# Patient Record
Sex: Female | Born: 1955 | Race: White | Hispanic: No | Smoking: Former smoker
Health system: Southern US, Community
[De-identification: ages and names within clinical notes are randomized; demographics above are authoritative.]

## PROBLEM LIST (undated history)

## (undated) DIAGNOSIS — R011 Cardiac murmur, unspecified: Secondary | ICD-10-CM

## (undated) DIAGNOSIS — F5000 Anorexia nervosa, unspecified: Secondary | ICD-10-CM

## (undated) DIAGNOSIS — F419 Anxiety disorder, unspecified: Secondary | ICD-10-CM

## (undated) DIAGNOSIS — G894 Chronic pain syndrome: Secondary | ICD-10-CM

## (undated) DIAGNOSIS — F4310 Post-traumatic stress disorder, unspecified: Secondary | ICD-10-CM

## (undated) DIAGNOSIS — R071 Chest pain on breathing: Secondary | ICD-10-CM

## (undated) DIAGNOSIS — Z889 Allergy status to unspecified drugs, medicaments and biological substances status: Secondary | ICD-10-CM

## (undated) DIAGNOSIS — A048 Other specified bacterial intestinal infections: Secondary | ICD-10-CM

## (undated) DIAGNOSIS — K439 Ventral hernia without obstruction or gangrene: Secondary | ICD-10-CM

## (undated) DIAGNOSIS — H35341 Macular cyst, hole, or pseudohole, right eye: Secondary | ICD-10-CM

## (undated) DIAGNOSIS — F329 Major depressive disorder, single episode, unspecified: Secondary | ICD-10-CM

## (undated) DIAGNOSIS — G629 Polyneuropathy, unspecified: Secondary | ICD-10-CM

## (undated) DIAGNOSIS — I7300 Raynaud's syndrome without gangrene: Secondary | ICD-10-CM

## (undated) DIAGNOSIS — M1990 Unspecified osteoarthritis, unspecified site: Secondary | ICD-10-CM

## (undated) DIAGNOSIS — F411 Generalized anxiety disorder: Secondary | ICD-10-CM

## (undated) DIAGNOSIS — E079 Disorder of thyroid, unspecified: Secondary | ICD-10-CM

## (undated) DIAGNOSIS — I10 Essential (primary) hypertension: Secondary | ICD-10-CM

## (undated) DIAGNOSIS — G3184 Mild cognitive impairment, so stated: Secondary | ICD-10-CM

## (undated) DIAGNOSIS — R251 Tremor, unspecified: Secondary | ICD-10-CM

## (undated) DIAGNOSIS — D649 Anemia, unspecified: Secondary | ICD-10-CM

## (undated) DIAGNOSIS — Z79899 Other long term (current) drug therapy: Secondary | ICD-10-CM

## (undated) DIAGNOSIS — R739 Hyperglycemia, unspecified: Secondary | ICD-10-CM

## (undated) DIAGNOSIS — Z87828 Personal history of other (healed) physical injury and trauma: Secondary | ICD-10-CM

## (undated) DIAGNOSIS — F070 Personality change due to known physiological condition: Secondary | ICD-10-CM

## (undated) DIAGNOSIS — K3184 Gastroparesis: Secondary | ICD-10-CM

## (undated) DIAGNOSIS — K219 Gastro-esophageal reflux disease without esophagitis: Secondary | ICD-10-CM

## (undated) DIAGNOSIS — Z9289 Personal history of other medical treatment: Secondary | ICD-10-CM

## (undated) HISTORY — DX: Post-traumatic stress disorder, unspecified: F43.10

## (undated) HISTORY — DX: Allergy status to unspecified drugs, medicaments and biological substances: Z88.9

## (undated) HISTORY — PX: CHOLECYSTECTOMY: SHX55

## (undated) HISTORY — PX: PEG TUBE PLACEMENT: SUR1034

## (undated) HISTORY — DX: Tremor, unspecified: R25.1

## (undated) HISTORY — DX: Mild cognitive impairment, so stated: G31.84

## (undated) HISTORY — DX: Gastro-esophageal reflux disease without esophagitis: K21.9

## (undated) HISTORY — DX: Raynaud's syndrome without gangrene: I73.00

## (undated) HISTORY — DX: Disorder of thyroid, unspecified: E07.9

## (undated) HISTORY — DX: Generalized anxiety disorder: F41.1

## (undated) HISTORY — DX: Personal history of other (healed) physical injury and trauma: Z87.828

## (undated) HISTORY — PX: BREAST SURGERY: SHX581

## (undated) HISTORY — DX: Hyperglycemia, unspecified: R73.9

## (undated) HISTORY — DX: Chest pain on breathing: R07.1

## (undated) HISTORY — DX: Macular cyst, hole, or pseudohole, right eye: H35.341

## (undated) HISTORY — DX: Chronic pain syndrome: G89.4

## (undated) HISTORY — DX: Ventral hernia without obstruction or gangrene: K43.9

## (undated) HISTORY — DX: Gastroparesis: K31.84

## (undated) HISTORY — PX: COLONOSCOPY: SHX174

## (undated) HISTORY — PX: HERNIA REPAIR: SHX51

## (undated) HISTORY — DX: Personality change due to known physiological condition: F07.0

## (undated) HISTORY — DX: Other long term (current) drug therapy: Z79.899

## (undated) HISTORY — PX: TONSILLECTOMY AND ADENOIDECTOMY: SUR1326

## (undated) HISTORY — DX: Other specified bacterial intestinal infections: A04.8

## (undated) HISTORY — DX: Anorexia nervosa, unspecified: F50.00

---

## 1997-05-18 ENCOUNTER — Other Ambulatory Visit: Admission: RE | Admit: 1997-05-18 | Discharge: 1997-05-18 | Payer: Self-pay | Admitting: *Deleted

## 1997-07-20 ENCOUNTER — Ambulatory Visit (HOSPITAL_COMMUNITY): Admission: RE | Admit: 1997-07-20 | Discharge: 1997-07-20 | Payer: Self-pay | Admitting: Internal Medicine

## 1997-07-28 ENCOUNTER — Ambulatory Visit (HOSPITAL_COMMUNITY): Admission: RE | Admit: 1997-07-28 | Discharge: 1997-07-28 | Payer: Self-pay | Admitting: Internal Medicine

## 1998-04-27 ENCOUNTER — Ambulatory Visit (HOSPITAL_COMMUNITY): Admission: RE | Admit: 1998-04-27 | Discharge: 1998-04-27 | Payer: Self-pay | Admitting: Family Medicine

## 1998-05-10 ENCOUNTER — Ambulatory Visit (HOSPITAL_COMMUNITY): Admission: RE | Admit: 1998-05-10 | Discharge: 1998-05-10 | Payer: Self-pay | Admitting: *Deleted

## 1998-05-10 ENCOUNTER — Encounter: Payer: Self-pay | Admitting: *Deleted

## 1998-06-07 ENCOUNTER — Encounter: Payer: Self-pay | Admitting: Internal Medicine

## 1998-06-07 ENCOUNTER — Encounter: Admission: RE | Admit: 1998-06-07 | Discharge: 1998-06-07 | Payer: Self-pay | Admitting: Internal Medicine

## 1998-06-07 ENCOUNTER — Ambulatory Visit (HOSPITAL_COMMUNITY): Admission: RE | Admit: 1998-06-07 | Discharge: 1998-06-07 | Payer: Self-pay | Admitting: Internal Medicine

## 1998-07-04 ENCOUNTER — Ambulatory Visit (HOSPITAL_COMMUNITY): Admission: RE | Admit: 1998-07-04 | Discharge: 1998-07-04 | Payer: Self-pay | Admitting: Cardiology

## 1998-07-17 ENCOUNTER — Ambulatory Visit (HOSPITAL_COMMUNITY): Admission: RE | Admit: 1998-07-17 | Discharge: 1998-07-17 | Payer: Self-pay | Admitting: Family Medicine

## 1998-07-17 ENCOUNTER — Encounter: Payer: Self-pay | Admitting: Family Medicine

## 1998-07-24 ENCOUNTER — Other Ambulatory Visit: Admission: RE | Admit: 1998-07-24 | Discharge: 1998-07-24 | Payer: Self-pay | Admitting: *Deleted

## 1998-07-27 ENCOUNTER — Ambulatory Visit: Admission: RE | Admit: 1998-07-27 | Discharge: 1998-07-27 | Payer: Self-pay | Admitting: Surgery

## 1998-08-16 ENCOUNTER — Ambulatory Visit (HOSPITAL_COMMUNITY): Admission: RE | Admit: 1998-08-16 | Discharge: 1998-08-16 | Payer: Self-pay | Admitting: Surgery

## 1998-09-29 ENCOUNTER — Inpatient Hospital Stay (HOSPITAL_COMMUNITY): Admission: RE | Admit: 1998-09-29 | Discharge: 1998-10-02 | Payer: Self-pay | Admitting: Surgery

## 1998-09-29 ENCOUNTER — Encounter (INDEPENDENT_AMBULATORY_CARE_PROVIDER_SITE_OTHER): Payer: Self-pay | Admitting: Specialist

## 1998-09-30 ENCOUNTER — Encounter: Payer: Self-pay | Admitting: Surgery

## 1998-11-02 ENCOUNTER — Encounter: Payer: Self-pay | Admitting: Surgery

## 1998-11-02 ENCOUNTER — Ambulatory Visit (HOSPITAL_COMMUNITY): Admission: RE | Admit: 1998-11-02 | Discharge: 1998-11-02 | Payer: Self-pay | Admitting: Surgery

## 1998-11-10 ENCOUNTER — Encounter: Payer: Self-pay | Admitting: *Deleted

## 1998-11-10 ENCOUNTER — Ambulatory Visit (HOSPITAL_COMMUNITY): Admission: RE | Admit: 1998-11-10 | Discharge: 1998-11-10 | Payer: Self-pay | Admitting: *Deleted

## 1998-11-15 ENCOUNTER — Emergency Department (HOSPITAL_COMMUNITY): Admission: EM | Admit: 1998-11-15 | Discharge: 1998-11-15 | Payer: Self-pay | Admitting: Emergency Medicine

## 1998-11-20 ENCOUNTER — Inpatient Hospital Stay (HOSPITAL_COMMUNITY): Admission: EM | Admit: 1998-11-20 | Discharge: 1998-11-21 | Payer: Self-pay | Admitting: Emergency Medicine

## 1999-01-09 ENCOUNTER — Encounter (INDEPENDENT_AMBULATORY_CARE_PROVIDER_SITE_OTHER): Payer: Self-pay

## 1999-01-09 ENCOUNTER — Ambulatory Visit (HOSPITAL_COMMUNITY): Admission: RE | Admit: 1999-01-09 | Discharge: 1999-01-09 | Payer: Self-pay | Admitting: Internal Medicine

## 1999-02-15 ENCOUNTER — Encounter: Admission: RE | Admit: 1999-02-15 | Discharge: 1999-05-16 | Payer: Self-pay | Admitting: Otolaryngology

## 1999-07-11 ENCOUNTER — Ambulatory Visit (HOSPITAL_COMMUNITY): Admission: RE | Admit: 1999-07-11 | Discharge: 1999-07-11 | Payer: Self-pay | Admitting: *Deleted

## 1999-07-11 ENCOUNTER — Encounter: Payer: Self-pay | Admitting: *Deleted

## 1999-09-07 ENCOUNTER — Other Ambulatory Visit: Admission: RE | Admit: 1999-09-07 | Discharge: 1999-09-07 | Payer: Self-pay | Admitting: *Deleted

## 2000-07-24 ENCOUNTER — Other Ambulatory Visit: Admission: RE | Admit: 2000-07-24 | Discharge: 2000-07-24 | Payer: Self-pay | Admitting: Internal Medicine

## 2000-07-24 ENCOUNTER — Encounter (INDEPENDENT_AMBULATORY_CARE_PROVIDER_SITE_OTHER): Payer: Self-pay | Admitting: Specialist

## 2000-10-01 ENCOUNTER — Encounter: Payer: Self-pay | Admitting: *Deleted

## 2000-10-01 ENCOUNTER — Ambulatory Visit (HOSPITAL_COMMUNITY): Admission: RE | Admit: 2000-10-01 | Discharge: 2000-10-01 | Payer: Self-pay | Admitting: *Deleted

## 2000-10-06 ENCOUNTER — Other Ambulatory Visit: Admission: RE | Admit: 2000-10-06 | Discharge: 2000-10-06 | Payer: Self-pay | Admitting: *Deleted

## 2001-01-27 ENCOUNTER — Ambulatory Visit (HOSPITAL_COMMUNITY): Admission: RE | Admit: 2001-01-27 | Discharge: 2001-01-27 | Payer: Self-pay | Admitting: Family Medicine

## 2001-01-27 ENCOUNTER — Encounter: Payer: Self-pay | Admitting: Family Medicine

## 2001-03-13 ENCOUNTER — Encounter: Payer: Self-pay | Admitting: Surgery

## 2001-03-18 ENCOUNTER — Ambulatory Visit (HOSPITAL_COMMUNITY): Admission: RE | Admit: 2001-03-18 | Discharge: 2001-03-18 | Payer: Self-pay | Admitting: Internal Medicine

## 2001-03-20 ENCOUNTER — Inpatient Hospital Stay (HOSPITAL_COMMUNITY): Admission: RE | Admit: 2001-03-20 | Discharge: 2001-03-26 | Payer: Self-pay | Admitting: Surgery

## 2001-03-21 ENCOUNTER — Encounter: Payer: Self-pay | Admitting: Surgery

## 2001-08-06 ENCOUNTER — Encounter: Admission: RE | Admit: 2001-08-06 | Discharge: 2001-08-06 | Payer: Self-pay | Admitting: Family Medicine

## 2001-08-19 ENCOUNTER — Encounter: Admission: RE | Admit: 2001-08-19 | Discharge: 2001-08-19 | Payer: Self-pay | Admitting: Sports Medicine

## 2001-08-31 ENCOUNTER — Encounter: Admission: RE | Admit: 2001-08-31 | Discharge: 2001-08-31 | Payer: Self-pay | Admitting: Family Medicine

## 2001-10-09 ENCOUNTER — Encounter: Admission: RE | Admit: 2001-10-09 | Discharge: 2001-10-09 | Payer: Self-pay | Admitting: Family Medicine

## 2001-10-14 ENCOUNTER — Other Ambulatory Visit: Admission: RE | Admit: 2001-10-14 | Discharge: 2001-10-14 | Payer: Self-pay | Admitting: *Deleted

## 2001-10-15 ENCOUNTER — Ambulatory Visit (HOSPITAL_COMMUNITY): Admission: RE | Admit: 2001-10-15 | Discharge: 2001-10-15 | Payer: Self-pay | Admitting: *Deleted

## 2001-10-15 ENCOUNTER — Encounter: Payer: Self-pay | Admitting: *Deleted

## 2001-10-26 ENCOUNTER — Encounter: Admission: RE | Admit: 2001-10-26 | Discharge: 2001-10-26 | Payer: Self-pay | Admitting: Family Medicine

## 2001-11-16 ENCOUNTER — Encounter: Admission: RE | Admit: 2001-11-16 | Discharge: 2001-11-16 | Payer: Self-pay | Admitting: Family Medicine

## 2001-11-30 ENCOUNTER — Encounter: Admission: RE | Admit: 2001-11-30 | Discharge: 2001-11-30 | Payer: Self-pay | Admitting: Family Medicine

## 2001-12-14 ENCOUNTER — Encounter: Admission: RE | Admit: 2001-12-14 | Discharge: 2001-12-14 | Payer: Self-pay | Admitting: Family Medicine

## 2001-12-28 ENCOUNTER — Encounter: Admission: RE | Admit: 2001-12-28 | Discharge: 2001-12-28 | Payer: Self-pay | Admitting: Family Medicine

## 2002-01-18 ENCOUNTER — Encounter: Admission: RE | Admit: 2002-01-18 | Discharge: 2002-01-18 | Payer: Self-pay | Admitting: Family Medicine

## 2002-02-18 ENCOUNTER — Encounter: Admission: RE | Admit: 2002-02-18 | Discharge: 2002-02-18 | Payer: Self-pay | Admitting: Sports Medicine

## 2002-03-04 ENCOUNTER — Encounter: Admission: RE | Admit: 2002-03-04 | Discharge: 2002-03-04 | Payer: Self-pay | Admitting: Family Medicine

## 2002-03-19 ENCOUNTER — Encounter: Admission: RE | Admit: 2002-03-19 | Discharge: 2002-03-19 | Payer: Self-pay | Admitting: Family Medicine

## 2002-03-30 ENCOUNTER — Encounter: Admission: RE | Admit: 2002-03-30 | Discharge: 2002-03-30 | Payer: Self-pay | Admitting: Family Medicine

## 2002-04-19 ENCOUNTER — Encounter: Admission: RE | Admit: 2002-04-19 | Discharge: 2002-04-19 | Payer: Self-pay | Admitting: Family Medicine

## 2002-04-26 ENCOUNTER — Encounter: Admission: RE | Admit: 2002-04-26 | Discharge: 2002-04-26 | Payer: Self-pay | Admitting: Family Medicine

## 2002-05-03 ENCOUNTER — Encounter: Admission: RE | Admit: 2002-05-03 | Discharge: 2002-05-03 | Payer: Self-pay | Admitting: Family Medicine

## 2002-05-10 ENCOUNTER — Encounter: Admission: RE | Admit: 2002-05-10 | Discharge: 2002-05-10 | Payer: Self-pay | Admitting: Family Medicine

## 2002-05-25 ENCOUNTER — Encounter: Payer: Self-pay | Admitting: Internal Medicine

## 2002-05-25 ENCOUNTER — Ambulatory Visit (HOSPITAL_COMMUNITY): Admission: RE | Admit: 2002-05-25 | Discharge: 2002-05-25 | Payer: Self-pay | Admitting: Internal Medicine

## 2002-06-01 ENCOUNTER — Encounter: Admission: RE | Admit: 2002-06-01 | Discharge: 2002-06-01 | Payer: Self-pay | Admitting: Family Medicine

## 2002-06-14 ENCOUNTER — Encounter: Admission: RE | Admit: 2002-06-14 | Discharge: 2002-06-14 | Payer: Self-pay | Admitting: Family Medicine

## 2002-06-30 ENCOUNTER — Encounter: Admission: RE | Admit: 2002-06-30 | Discharge: 2002-06-30 | Payer: Self-pay | Admitting: Family Medicine

## 2002-07-13 ENCOUNTER — Encounter: Admission: RE | Admit: 2002-07-13 | Discharge: 2002-07-13 | Payer: Self-pay | Admitting: Family Medicine

## 2002-07-28 ENCOUNTER — Encounter: Admission: RE | Admit: 2002-07-28 | Discharge: 2002-07-28 | Payer: Self-pay | Admitting: Family Medicine

## 2002-08-11 ENCOUNTER — Encounter: Admission: RE | Admit: 2002-08-11 | Discharge: 2002-08-11 | Payer: Self-pay | Admitting: Family Medicine

## 2002-08-18 ENCOUNTER — Encounter: Admission: RE | Admit: 2002-08-18 | Discharge: 2002-08-18 | Payer: Self-pay | Admitting: Family Medicine

## 2002-08-23 ENCOUNTER — Encounter: Admission: RE | Admit: 2002-08-23 | Discharge: 2002-08-23 | Payer: Self-pay | Admitting: Family Medicine

## 2002-09-06 ENCOUNTER — Encounter: Admission: RE | Admit: 2002-09-06 | Discharge: 2002-09-06 | Payer: Self-pay | Admitting: Family Medicine

## 2002-09-15 ENCOUNTER — Encounter: Admission: RE | Admit: 2002-09-15 | Discharge: 2002-09-15 | Payer: Self-pay | Admitting: Family Medicine

## 2002-09-22 ENCOUNTER — Encounter: Admission: RE | Admit: 2002-09-22 | Discharge: 2002-09-22 | Payer: Self-pay | Admitting: Family Medicine

## 2002-09-27 ENCOUNTER — Encounter: Payer: Self-pay | Admitting: *Deleted

## 2002-09-27 ENCOUNTER — Encounter: Admission: RE | Admit: 2002-09-27 | Discharge: 2002-09-27 | Payer: Self-pay | Admitting: *Deleted

## 2002-09-28 ENCOUNTER — Encounter: Admission: RE | Admit: 2002-09-28 | Discharge: 2002-09-28 | Payer: Self-pay | Admitting: Family Medicine

## 2002-10-06 ENCOUNTER — Encounter: Admission: RE | Admit: 2002-10-06 | Discharge: 2002-10-06 | Payer: Self-pay | Admitting: Family Medicine

## 2002-10-12 ENCOUNTER — Encounter: Admission: RE | Admit: 2002-10-12 | Discharge: 2002-10-12 | Payer: Self-pay | Admitting: Family Medicine

## 2002-10-19 ENCOUNTER — Encounter: Admission: RE | Admit: 2002-10-19 | Discharge: 2002-10-19 | Payer: Self-pay | Admitting: Family Medicine

## 2002-10-20 ENCOUNTER — Other Ambulatory Visit: Admission: RE | Admit: 2002-10-20 | Discharge: 2002-10-20 | Payer: Self-pay | Admitting: *Deleted

## 2002-11-11 ENCOUNTER — Encounter: Admission: RE | Admit: 2002-11-11 | Discharge: 2002-11-11 | Payer: Self-pay | Admitting: Family Medicine

## 2002-11-16 ENCOUNTER — Encounter: Admission: RE | Admit: 2002-11-16 | Discharge: 2002-11-16 | Payer: Self-pay | Admitting: Family Medicine

## 2002-11-22 ENCOUNTER — Encounter: Admission: RE | Admit: 2002-11-22 | Discharge: 2002-11-22 | Payer: Self-pay | Admitting: Family Medicine

## 2002-12-06 ENCOUNTER — Encounter: Admission: RE | Admit: 2002-12-06 | Discharge: 2002-12-06 | Payer: Self-pay | Admitting: Family Medicine

## 2002-12-13 ENCOUNTER — Encounter: Admission: RE | Admit: 2002-12-13 | Discharge: 2002-12-13 | Payer: Self-pay | Admitting: Family Medicine

## 2002-12-24 ENCOUNTER — Encounter: Admission: RE | Admit: 2002-12-24 | Discharge: 2002-12-24 | Payer: Self-pay | Admitting: Family Medicine

## 2002-12-31 ENCOUNTER — Encounter: Admission: RE | Admit: 2002-12-31 | Discharge: 2002-12-31 | Payer: Self-pay | Admitting: Family Medicine

## 2003-01-10 ENCOUNTER — Encounter: Admission: RE | Admit: 2003-01-10 | Discharge: 2003-01-10 | Payer: Self-pay | Admitting: Family Medicine

## 2003-01-17 ENCOUNTER — Encounter: Admission: RE | Admit: 2003-01-17 | Discharge: 2003-01-17 | Payer: Self-pay | Admitting: Family Medicine

## 2003-01-24 ENCOUNTER — Encounter: Admission: RE | Admit: 2003-01-24 | Discharge: 2003-01-24 | Payer: Self-pay | Admitting: Family Medicine

## 2003-01-31 ENCOUNTER — Encounter: Admission: RE | Admit: 2003-01-31 | Discharge: 2003-01-31 | Payer: Self-pay | Admitting: Family Medicine

## 2003-02-12 HISTORY — PX: NISSEN FUNDOPLICATION: SHX2091

## 2003-02-21 ENCOUNTER — Encounter: Admission: RE | Admit: 2003-02-21 | Discharge: 2003-02-21 | Payer: Self-pay | Admitting: Family Medicine

## 2003-03-14 ENCOUNTER — Encounter: Admission: RE | Admit: 2003-03-14 | Discharge: 2003-03-14 | Payer: Self-pay | Admitting: Family Medicine

## 2003-03-21 ENCOUNTER — Encounter: Admission: RE | Admit: 2003-03-21 | Discharge: 2003-03-21 | Payer: Self-pay | Admitting: Family Medicine

## 2003-03-28 ENCOUNTER — Encounter: Admission: RE | Admit: 2003-03-28 | Discharge: 2003-03-28 | Payer: Self-pay | Admitting: Family Medicine

## 2003-04-04 ENCOUNTER — Encounter: Admission: RE | Admit: 2003-04-04 | Discharge: 2003-04-04 | Payer: Self-pay | Admitting: Family Medicine

## 2003-04-11 ENCOUNTER — Encounter: Admission: RE | Admit: 2003-04-11 | Discharge: 2003-04-11 | Payer: Self-pay | Admitting: Family Medicine

## 2003-04-18 ENCOUNTER — Encounter: Admission: RE | Admit: 2003-04-18 | Discharge: 2003-04-18 | Payer: Self-pay | Admitting: Family Medicine

## 2003-04-25 ENCOUNTER — Encounter: Admission: RE | Admit: 2003-04-25 | Discharge: 2003-04-25 | Payer: Self-pay | Admitting: Family Medicine

## 2003-05-02 ENCOUNTER — Encounter: Admission: RE | Admit: 2003-05-02 | Discharge: 2003-05-02 | Payer: Self-pay | Admitting: Family Medicine

## 2003-05-09 ENCOUNTER — Other Ambulatory Visit: Admission: RE | Admit: 2003-05-09 | Discharge: 2003-05-09 | Payer: Self-pay | Admitting: *Deleted

## 2003-05-09 ENCOUNTER — Encounter: Admission: RE | Admit: 2003-05-09 | Discharge: 2003-05-09 | Payer: Self-pay | Admitting: Family Medicine

## 2003-05-16 ENCOUNTER — Encounter: Admission: RE | Admit: 2003-05-16 | Discharge: 2003-05-16 | Payer: Self-pay | Admitting: Family Medicine

## 2003-05-30 ENCOUNTER — Encounter: Admission: RE | Admit: 2003-05-30 | Discharge: 2003-05-30 | Payer: Self-pay | Admitting: Family Medicine

## 2003-06-06 ENCOUNTER — Encounter: Admission: RE | Admit: 2003-06-06 | Discharge: 2003-06-06 | Payer: Self-pay | Admitting: Family Medicine

## 2003-06-13 ENCOUNTER — Encounter: Admission: RE | Admit: 2003-06-13 | Discharge: 2003-06-13 | Payer: Self-pay | Admitting: Family Medicine

## 2003-06-20 ENCOUNTER — Encounter: Admission: RE | Admit: 2003-06-20 | Discharge: 2003-06-20 | Payer: Self-pay | Admitting: Family Medicine

## 2003-06-27 ENCOUNTER — Encounter: Admission: RE | Admit: 2003-06-27 | Discharge: 2003-06-27 | Payer: Self-pay | Admitting: Family Medicine

## 2003-07-01 ENCOUNTER — Encounter: Admission: RE | Admit: 2003-07-01 | Discharge: 2003-07-01 | Payer: Self-pay | Admitting: Surgery

## 2003-07-05 ENCOUNTER — Ambulatory Visit (HOSPITAL_BASED_OUTPATIENT_CLINIC_OR_DEPARTMENT_OTHER): Admission: RE | Admit: 2003-07-05 | Discharge: 2003-07-05 | Payer: Self-pay | Admitting: Surgery

## 2003-07-05 ENCOUNTER — Ambulatory Visit (HOSPITAL_COMMUNITY): Admission: RE | Admit: 2003-07-05 | Discharge: 2003-07-05 | Payer: Self-pay | Admitting: Surgery

## 2003-07-18 ENCOUNTER — Encounter: Admission: RE | Admit: 2003-07-18 | Discharge: 2003-07-18 | Payer: Self-pay | Admitting: Family Medicine

## 2003-07-25 ENCOUNTER — Encounter: Admission: RE | Admit: 2003-07-25 | Discharge: 2003-07-25 | Payer: Self-pay | Admitting: Family Medicine

## 2003-08-08 ENCOUNTER — Encounter: Admission: RE | Admit: 2003-08-08 | Discharge: 2003-08-08 | Payer: Self-pay | Admitting: Family Medicine

## 2003-08-22 ENCOUNTER — Encounter: Admission: RE | Admit: 2003-08-22 | Discharge: 2003-08-22 | Payer: Self-pay | Admitting: Family Medicine

## 2003-09-05 ENCOUNTER — Encounter: Admission: RE | Admit: 2003-09-05 | Discharge: 2003-09-05 | Payer: Self-pay | Admitting: Family Medicine

## 2003-09-26 ENCOUNTER — Encounter: Admission: RE | Admit: 2003-09-26 | Discharge: 2003-09-26 | Payer: Self-pay | Admitting: Family Medicine

## 2003-10-03 ENCOUNTER — Encounter: Admission: RE | Admit: 2003-10-03 | Discharge: 2003-10-03 | Payer: Self-pay | Admitting: Family Medicine

## 2003-10-10 ENCOUNTER — Encounter: Admission: RE | Admit: 2003-10-10 | Discharge: 2003-10-10 | Payer: Self-pay | Admitting: Family Medicine

## 2003-10-24 ENCOUNTER — Ambulatory Visit: Payer: Self-pay | Admitting: Family Medicine

## 2003-10-31 ENCOUNTER — Ambulatory Visit: Payer: Self-pay | Admitting: Family Medicine

## 2003-11-07 ENCOUNTER — Ambulatory Visit: Payer: Self-pay | Admitting: Family Medicine

## 2003-11-14 ENCOUNTER — Ambulatory Visit: Payer: Self-pay | Admitting: Family Medicine

## 2003-11-21 ENCOUNTER — Ambulatory Visit: Payer: Self-pay | Admitting: Family Medicine

## 2003-11-30 ENCOUNTER — Encounter: Admission: RE | Admit: 2003-11-30 | Discharge: 2003-11-30 | Payer: Self-pay | Admitting: *Deleted

## 2003-12-05 ENCOUNTER — Ambulatory Visit: Payer: Self-pay | Admitting: Family Medicine

## 2003-12-06 ENCOUNTER — Other Ambulatory Visit: Admission: RE | Admit: 2003-12-06 | Discharge: 2003-12-06 | Payer: Self-pay | Admitting: *Deleted

## 2003-12-12 ENCOUNTER — Ambulatory Visit: Payer: Self-pay | Admitting: Family Medicine

## 2003-12-19 ENCOUNTER — Ambulatory Visit: Payer: Self-pay | Admitting: Family Medicine

## 2003-12-26 ENCOUNTER — Ambulatory Visit: Payer: Self-pay | Admitting: Family Medicine

## 2004-01-09 ENCOUNTER — Ambulatory Visit: Payer: Self-pay | Admitting: Family Medicine

## 2004-01-16 ENCOUNTER — Ambulatory Visit: Payer: Self-pay | Admitting: Family Medicine

## 2004-01-30 ENCOUNTER — Ambulatory Visit: Payer: Self-pay | Admitting: Family Medicine

## 2004-02-14 ENCOUNTER — Ambulatory Visit: Payer: Self-pay | Admitting: Family Medicine

## 2004-02-20 ENCOUNTER — Ambulatory Visit: Payer: Self-pay | Admitting: Family Medicine

## 2004-03-05 ENCOUNTER — Ambulatory Visit: Payer: Self-pay | Admitting: Family Medicine

## 2004-03-12 ENCOUNTER — Ambulatory Visit: Payer: Self-pay | Admitting: Family Medicine

## 2004-03-20 ENCOUNTER — Ambulatory Visit: Payer: Self-pay | Admitting: Sports Medicine

## 2004-03-27 ENCOUNTER — Ambulatory Visit: Payer: Self-pay | Admitting: Family Medicine

## 2004-04-03 ENCOUNTER — Ambulatory Visit: Payer: Self-pay | Admitting: Family Medicine

## 2004-04-10 ENCOUNTER — Ambulatory Visit: Payer: Self-pay | Admitting: Family Medicine

## 2004-04-24 ENCOUNTER — Ambulatory Visit: Payer: Self-pay | Admitting: Family Medicine

## 2004-05-01 ENCOUNTER — Ambulatory Visit: Payer: Self-pay | Admitting: Family Medicine

## 2004-05-16 ENCOUNTER — Ambulatory Visit: Payer: Self-pay | Admitting: Family Medicine

## 2004-05-29 ENCOUNTER — Ambulatory Visit: Payer: Self-pay | Admitting: Family Medicine

## 2004-06-05 ENCOUNTER — Ambulatory Visit: Payer: Self-pay | Admitting: Family Medicine

## 2004-06-19 ENCOUNTER — Ambulatory Visit: Payer: Self-pay | Admitting: Family Medicine

## 2004-06-26 ENCOUNTER — Ambulatory Visit: Payer: Self-pay | Admitting: Family Medicine

## 2004-07-03 ENCOUNTER — Ambulatory Visit: Payer: Self-pay | Admitting: Family Medicine

## 2004-07-17 ENCOUNTER — Ambulatory Visit: Payer: Self-pay | Admitting: Family Medicine

## 2004-07-24 ENCOUNTER — Ambulatory Visit: Payer: Self-pay | Admitting: Internal Medicine

## 2004-07-26 ENCOUNTER — Ambulatory Visit: Payer: Self-pay | Admitting: Family Medicine

## 2004-07-26 ENCOUNTER — Ambulatory Visit: Payer: Self-pay | Admitting: Internal Medicine

## 2004-07-31 ENCOUNTER — Ambulatory Visit: Payer: Self-pay | Admitting: Family Medicine

## 2004-08-07 ENCOUNTER — Ambulatory Visit: Payer: Self-pay | Admitting: Family Medicine

## 2004-08-15 ENCOUNTER — Ambulatory Visit: Payer: Self-pay | Admitting: Internal Medicine

## 2004-08-16 ENCOUNTER — Ambulatory Visit: Payer: Self-pay | Admitting: Family Medicine

## 2004-08-21 ENCOUNTER — Ambulatory Visit: Payer: Self-pay | Admitting: Family Medicine

## 2004-08-28 ENCOUNTER — Ambulatory Visit: Payer: Self-pay | Admitting: Family Medicine

## 2004-09-04 ENCOUNTER — Ambulatory Visit: Payer: Self-pay | Admitting: Sports Medicine

## 2004-09-18 ENCOUNTER — Ambulatory Visit: Payer: Self-pay | Admitting: Family Medicine

## 2004-09-26 ENCOUNTER — Ambulatory Visit: Payer: Self-pay | Admitting: Family Medicine

## 2004-10-02 ENCOUNTER — Ambulatory Visit: Payer: Self-pay | Admitting: Sports Medicine

## 2004-10-09 ENCOUNTER — Ambulatory Visit: Payer: Self-pay | Admitting: Family Medicine

## 2004-10-16 ENCOUNTER — Ambulatory Visit: Payer: Self-pay | Admitting: Family Medicine

## 2004-10-31 ENCOUNTER — Encounter (INDEPENDENT_AMBULATORY_CARE_PROVIDER_SITE_OTHER): Payer: Self-pay | Admitting: Specialist

## 2004-10-31 ENCOUNTER — Ambulatory Visit (HOSPITAL_COMMUNITY): Admission: RE | Admit: 2004-10-31 | Discharge: 2004-11-01 | Payer: Self-pay | Admitting: Surgery

## 2004-11-06 ENCOUNTER — Ambulatory Visit: Payer: Self-pay | Admitting: Family Medicine

## 2004-11-13 ENCOUNTER — Ambulatory Visit: Payer: Self-pay | Admitting: Sports Medicine

## 2004-11-27 ENCOUNTER — Ambulatory Visit: Payer: Self-pay | Admitting: Family Medicine

## 2004-12-04 ENCOUNTER — Ambulatory Visit: Payer: Self-pay | Admitting: Family Medicine

## 2004-12-10 ENCOUNTER — Other Ambulatory Visit: Admission: RE | Admit: 2004-12-10 | Discharge: 2004-12-10 | Payer: Self-pay | Admitting: Obstetrics and Gynecology

## 2004-12-11 ENCOUNTER — Ambulatory Visit: Payer: Self-pay | Admitting: Family Medicine

## 2004-12-18 ENCOUNTER — Ambulatory Visit: Payer: Self-pay | Admitting: Family Medicine

## 2004-12-26 ENCOUNTER — Encounter: Admission: RE | Admit: 2004-12-26 | Discharge: 2004-12-26 | Payer: Self-pay | Admitting: *Deleted

## 2005-01-01 ENCOUNTER — Ambulatory Visit: Payer: Self-pay | Admitting: Family Medicine

## 2005-01-15 ENCOUNTER — Ambulatory Visit: Payer: Self-pay | Admitting: Family Medicine

## 2005-01-18 ENCOUNTER — Encounter: Admission: RE | Admit: 2005-01-18 | Discharge: 2005-01-18 | Payer: Self-pay | Admitting: *Deleted

## 2005-01-22 ENCOUNTER — Ambulatory Visit: Payer: Self-pay | Admitting: Family Medicine

## 2005-01-29 ENCOUNTER — Ambulatory Visit: Payer: Self-pay | Admitting: Family Medicine

## 2005-02-12 ENCOUNTER — Ambulatory Visit: Payer: Self-pay | Admitting: Sports Medicine

## 2005-02-19 ENCOUNTER — Ambulatory Visit: Payer: Self-pay | Admitting: Sports Medicine

## 2005-02-26 ENCOUNTER — Ambulatory Visit: Payer: Self-pay | Admitting: Family Medicine

## 2005-03-05 ENCOUNTER — Ambulatory Visit: Payer: Self-pay | Admitting: Family Medicine

## 2005-03-12 ENCOUNTER — Ambulatory Visit: Payer: Self-pay | Admitting: Family Medicine

## 2005-03-19 ENCOUNTER — Ambulatory Visit: Payer: Self-pay | Admitting: Family Medicine

## 2005-03-26 ENCOUNTER — Ambulatory Visit: Payer: Self-pay | Admitting: Sports Medicine

## 2005-04-02 ENCOUNTER — Ambulatory Visit: Payer: Self-pay | Admitting: Family Medicine

## 2005-04-09 ENCOUNTER — Ambulatory Visit: Payer: Self-pay | Admitting: Family Medicine

## 2005-04-16 ENCOUNTER — Ambulatory Visit: Payer: Self-pay | Admitting: Family Medicine

## 2005-04-23 ENCOUNTER — Ambulatory Visit: Payer: Self-pay | Admitting: Family Medicine

## 2005-04-30 ENCOUNTER — Ambulatory Visit: Payer: Self-pay | Admitting: Family Medicine

## 2005-05-07 ENCOUNTER — Ambulatory Visit: Payer: Self-pay | Admitting: Sports Medicine

## 2005-05-21 ENCOUNTER — Ambulatory Visit: Payer: Self-pay | Admitting: Family Medicine

## 2005-05-28 ENCOUNTER — Ambulatory Visit: Payer: Self-pay | Admitting: Family Medicine

## 2005-06-04 ENCOUNTER — Ambulatory Visit: Payer: Self-pay | Admitting: Family Medicine

## 2005-06-11 ENCOUNTER — Ambulatory Visit: Payer: Self-pay | Admitting: Family Medicine

## 2005-06-18 ENCOUNTER — Ambulatory Visit: Payer: Self-pay | Admitting: Family Medicine

## 2005-06-25 ENCOUNTER — Ambulatory Visit: Payer: Self-pay | Admitting: Family Medicine

## 2005-07-02 ENCOUNTER — Ambulatory Visit: Payer: Self-pay | Admitting: Sports Medicine

## 2005-07-09 ENCOUNTER — Ambulatory Visit: Payer: Self-pay | Admitting: Sports Medicine

## 2005-07-16 ENCOUNTER — Ambulatory Visit: Payer: Self-pay | Admitting: Family Medicine

## 2005-07-23 ENCOUNTER — Ambulatory Visit: Payer: Self-pay | Admitting: Family Medicine

## 2005-07-30 ENCOUNTER — Ambulatory Visit: Payer: Self-pay | Admitting: Family Medicine

## 2005-08-06 ENCOUNTER — Ambulatory Visit: Payer: Self-pay | Admitting: Family Medicine

## 2005-08-06 ENCOUNTER — Encounter: Admission: RE | Admit: 2005-08-06 | Discharge: 2005-08-06 | Payer: Self-pay | Admitting: Obstetrics and Gynecology

## 2005-08-20 ENCOUNTER — Ambulatory Visit: Payer: Self-pay | Admitting: Sports Medicine

## 2005-08-22 ENCOUNTER — Emergency Department (HOSPITAL_COMMUNITY): Admission: EM | Admit: 2005-08-22 | Discharge: 2005-08-23 | Payer: Self-pay | Admitting: Emergency Medicine

## 2005-08-27 ENCOUNTER — Ambulatory Visit: Payer: Self-pay

## 2005-09-10 ENCOUNTER — Ambulatory Visit: Payer: Self-pay | Admitting: Family Medicine

## 2005-10-17 ENCOUNTER — Ambulatory Visit: Payer: Self-pay | Admitting: Family Medicine

## 2005-10-21 ENCOUNTER — Ambulatory Visit: Payer: Self-pay | Admitting: Internal Medicine

## 2005-10-22 ENCOUNTER — Ambulatory Visit: Payer: Self-pay | Admitting: Internal Medicine

## 2005-10-22 ENCOUNTER — Encounter (INDEPENDENT_AMBULATORY_CARE_PROVIDER_SITE_OTHER): Payer: Self-pay | Admitting: *Deleted

## 2005-10-25 ENCOUNTER — Ambulatory Visit: Payer: Self-pay | Admitting: Internal Medicine

## 2005-10-29 ENCOUNTER — Ambulatory Visit: Payer: Self-pay | Admitting: Sports Medicine

## 2005-10-30 ENCOUNTER — Ambulatory Visit: Payer: Self-pay | Admitting: Cardiology

## 2005-11-26 ENCOUNTER — Ambulatory Visit: Payer: Self-pay | Admitting: Family Medicine

## 2005-12-03 ENCOUNTER — Ambulatory Visit: Payer: Self-pay | Admitting: Internal Medicine

## 2005-12-05 ENCOUNTER — Ambulatory Visit: Payer: Self-pay | Admitting: Family Medicine

## 2005-12-09 ENCOUNTER — Ambulatory Visit (HOSPITAL_COMMUNITY): Admission: RE | Admit: 2005-12-09 | Discharge: 2005-12-09 | Payer: Self-pay | Admitting: Internal Medicine

## 2005-12-12 ENCOUNTER — Ambulatory Visit: Payer: Self-pay | Admitting: Sports Medicine

## 2005-12-13 ENCOUNTER — Other Ambulatory Visit: Admission: RE | Admit: 2005-12-13 | Discharge: 2005-12-13 | Payer: Self-pay | Admitting: Obstetrics and Gynecology

## 2005-12-19 ENCOUNTER — Ambulatory Visit: Payer: Self-pay | Admitting: Family Medicine

## 2005-12-31 ENCOUNTER — Ambulatory Visit: Payer: Self-pay | Admitting: Family Medicine

## 2006-01-14 ENCOUNTER — Ambulatory Visit: Payer: Self-pay | Admitting: Family Medicine

## 2006-01-15 ENCOUNTER — Ambulatory Visit: Payer: Self-pay | Admitting: Internal Medicine

## 2006-01-15 ENCOUNTER — Encounter: Admission: RE | Admit: 2006-01-15 | Discharge: 2006-01-15 | Payer: Self-pay | Admitting: Obstetrics and Gynecology

## 2006-01-16 ENCOUNTER — Ambulatory Visit: Payer: Self-pay | Admitting: Family Medicine

## 2006-01-21 ENCOUNTER — Ambulatory Visit: Payer: Self-pay | Admitting: Sports Medicine

## 2006-01-28 ENCOUNTER — Ambulatory Visit: Payer: Self-pay | Admitting: Family Medicine

## 2006-02-13 ENCOUNTER — Ambulatory Visit: Payer: Self-pay | Admitting: Family Medicine

## 2006-02-19 ENCOUNTER — Encounter: Admission: RE | Admit: 2006-02-19 | Discharge: 2006-05-20 | Payer: Self-pay | Admitting: Family Medicine

## 2006-02-25 ENCOUNTER — Ambulatory Visit: Payer: Self-pay | Admitting: Family Medicine

## 2006-02-28 ENCOUNTER — Ambulatory Visit: Payer: Self-pay | Admitting: Internal Medicine

## 2006-03-03 ENCOUNTER — Ambulatory Visit: Payer: Self-pay | Admitting: Internal Medicine

## 2006-03-04 ENCOUNTER — Ambulatory Visit: Payer: Self-pay | Admitting: Family Medicine

## 2006-03-11 ENCOUNTER — Ambulatory Visit: Payer: Self-pay | Admitting: Internal Medicine

## 2006-03-18 ENCOUNTER — Ambulatory Visit: Payer: Self-pay | Admitting: Family Medicine

## 2006-04-08 ENCOUNTER — Ambulatory Visit: Payer: Self-pay | Admitting: Family Medicine

## 2006-04-25 ENCOUNTER — Ambulatory Visit: Payer: Self-pay | Admitting: Family Medicine

## 2006-04-25 DIAGNOSIS — K3184 Gastroparesis: Secondary | ICD-10-CM | POA: Insufficient documentation

## 2006-04-25 DIAGNOSIS — K2950 Unspecified chronic gastritis without bleeding: Secondary | ICD-10-CM | POA: Insufficient documentation

## 2006-04-25 DIAGNOSIS — K2970 Gastritis, unspecified, without bleeding: Secondary | ICD-10-CM | POA: Insufficient documentation

## 2006-04-25 DIAGNOSIS — K2990 Gastroduodenitis, unspecified, without bleeding: Secondary | ICD-10-CM

## 2006-04-30 ENCOUNTER — Ambulatory Visit: Payer: Self-pay | Admitting: Family Medicine

## 2006-05-06 ENCOUNTER — Ambulatory Visit: Payer: Self-pay | Admitting: Family Medicine

## 2006-05-20 ENCOUNTER — Ambulatory Visit: Payer: Self-pay | Admitting: Family Medicine

## 2006-05-29 ENCOUNTER — Ambulatory Visit (HOSPITAL_COMMUNITY): Admission: RE | Admit: 2006-05-29 | Discharge: 2006-05-29 | Payer: Self-pay | Admitting: Gastroenterology

## 2006-06-10 ENCOUNTER — Ambulatory Visit: Payer: Self-pay | Admitting: Family Medicine

## 2006-06-24 ENCOUNTER — Ambulatory Visit: Payer: Self-pay | Admitting: Family Medicine

## 2006-07-08 ENCOUNTER — Ambulatory Visit: Payer: Self-pay | Admitting: Family Medicine

## 2006-07-22 ENCOUNTER — Ambulatory Visit: Payer: Self-pay | Admitting: Family Medicine

## 2006-08-05 ENCOUNTER — Ambulatory Visit: Payer: Self-pay | Admitting: Sports Medicine

## 2006-08-19 ENCOUNTER — Ambulatory Visit: Payer: Self-pay | Admitting: Family Medicine

## 2006-09-09 ENCOUNTER — Ambulatory Visit: Payer: Self-pay | Admitting: Family Medicine

## 2006-09-28 ENCOUNTER — Encounter: Admission: RE | Admit: 2006-09-28 | Discharge: 2006-09-28 | Payer: Self-pay | Admitting: Family Medicine

## 2006-10-07 ENCOUNTER — Ambulatory Visit: Payer: Self-pay | Admitting: Family Medicine

## 2006-11-04 ENCOUNTER — Ambulatory Visit: Payer: Self-pay | Admitting: Family Medicine

## 2006-11-11 ENCOUNTER — Ambulatory Visit (HOSPITAL_COMMUNITY): Admission: RE | Admit: 2006-11-11 | Discharge: 2006-11-11 | Payer: Self-pay | Admitting: Gastroenterology

## 2006-11-24 ENCOUNTER — Encounter: Payer: Self-pay | Admitting: Family Medicine

## 2006-11-24 LAB — CONVERTED CEMR LAB
ALT: 9 units/L
AST: 16 units/L
Alkaline Phosphatase: 84 units/L
BUN: 9 mg/dL
Calcium: 9.8 mg/dL
Creatinine, Ser: 1.16 mg/dL
GFR calc non Af Amer: 50 mL/min
Glucose, Bld: 109 mg/dL
HCT: 39.6 %
Hemoglobin: 13.4 g/dL
Hemoglobin: 13.4 g/dL
Potassium: 4.9 meq/L
Total Bilirubin: 0.3 mg/dL
Total Protein: 6.7 g/dL

## 2006-12-02 ENCOUNTER — Ambulatory Visit: Payer: Self-pay | Admitting: Family Medicine

## 2006-12-30 ENCOUNTER — Ambulatory Visit: Payer: Self-pay | Admitting: Family Medicine

## 2007-01-01 ENCOUNTER — Encounter: Payer: Self-pay | Admitting: Family Medicine

## 2007-01-01 LAB — CONVERTED CEMR LAB: Glucose, Bld: 109 mg/dL

## 2007-01-26 ENCOUNTER — Ambulatory Visit: Payer: Self-pay | Admitting: Sports Medicine

## 2007-02-19 ENCOUNTER — Encounter: Payer: Self-pay | Admitting: Family Medicine

## 2007-02-19 ENCOUNTER — Other Ambulatory Visit: Admission: RE | Admit: 2007-02-19 | Discharge: 2007-02-19 | Payer: Self-pay | Admitting: Internal Medicine

## 2007-02-19 LAB — CONVERTED CEMR LAB
TSH: 1.157 microintl units/mL
Vit D, 1,25-Dihydroxy: 34

## 2007-02-23 ENCOUNTER — Ambulatory Visit: Payer: Self-pay | Admitting: Family Medicine

## 2007-02-23 DIAGNOSIS — R7309 Other abnormal glucose: Secondary | ICD-10-CM | POA: Insufficient documentation

## 2007-02-23 HISTORY — DX: Other abnormal glucose: R73.09

## 2007-02-24 ENCOUNTER — Encounter: Admission: RE | Admit: 2007-02-24 | Discharge: 2007-02-24 | Payer: Self-pay | Admitting: Obstetrics and Gynecology

## 2007-03-02 ENCOUNTER — Encounter: Admission: RE | Admit: 2007-03-02 | Discharge: 2007-03-02 | Payer: Self-pay | Admitting: Obstetrics and Gynecology

## 2007-03-09 ENCOUNTER — Ambulatory Visit: Payer: Self-pay | Admitting: Family Medicine

## 2007-03-23 ENCOUNTER — Ambulatory Visit: Payer: Self-pay | Admitting: Family Medicine

## 2007-04-06 ENCOUNTER — Ambulatory Visit: Payer: Self-pay | Admitting: Sports Medicine

## 2007-04-20 ENCOUNTER — Ambulatory Visit: Payer: Self-pay | Admitting: Sports Medicine

## 2007-05-04 ENCOUNTER — Ambulatory Visit: Payer: Self-pay | Admitting: Sports Medicine

## 2007-05-18 ENCOUNTER — Ambulatory Visit: Payer: Self-pay | Admitting: Family Medicine

## 2007-06-01 ENCOUNTER — Ambulatory Visit: Payer: Self-pay | Admitting: Sports Medicine

## 2007-06-11 LAB — CONVERTED CEMR LAB: Vit D, 1,25-Dihydroxy: 32

## 2007-06-17 ENCOUNTER — Ambulatory Visit: Payer: Self-pay | Admitting: Family Medicine

## 2007-06-29 ENCOUNTER — Ambulatory Visit: Payer: Self-pay | Admitting: Family Medicine

## 2007-07-13 ENCOUNTER — Ambulatory Visit: Payer: Self-pay | Admitting: Family Medicine

## 2007-07-27 ENCOUNTER — Ambulatory Visit: Payer: Self-pay | Admitting: Family Medicine

## 2007-08-05 ENCOUNTER — Ambulatory Visit (HOSPITAL_COMMUNITY): Admission: RE | Admit: 2007-08-05 | Discharge: 2007-08-05 | Payer: Self-pay | Admitting: Gastroenterology

## 2007-08-10 ENCOUNTER — Ambulatory Visit: Payer: Self-pay | Admitting: Family Medicine

## 2007-08-20 LAB — CONVERTED CEMR LAB: Vit D, 1,25-Dihydroxy: 49

## 2007-08-24 ENCOUNTER — Ambulatory Visit: Payer: Self-pay | Admitting: Sports Medicine

## 2007-09-07 ENCOUNTER — Ambulatory Visit: Payer: Self-pay | Admitting: Family Medicine

## 2007-09-21 ENCOUNTER — Ambulatory Visit: Payer: Self-pay | Admitting: Family Medicine

## 2007-10-13 ENCOUNTER — Ambulatory Visit: Payer: Self-pay | Admitting: Family Medicine

## 2007-10-16 ENCOUNTER — Encounter: Payer: Self-pay | Admitting: Family Medicine

## 2007-10-27 ENCOUNTER — Ambulatory Visit: Payer: Self-pay | Admitting: Family Medicine

## 2007-11-10 ENCOUNTER — Ambulatory Visit: Payer: Self-pay | Admitting: Family Medicine

## 2007-11-10 DIAGNOSIS — F5000 Anorexia nervosa, unspecified: Secondary | ICD-10-CM

## 2007-11-10 DIAGNOSIS — F509 Eating disorder, unspecified: Secondary | ICD-10-CM | POA: Insufficient documentation

## 2007-11-10 HISTORY — DX: Anorexia nervosa, unspecified: F50.00

## 2007-11-24 ENCOUNTER — Ambulatory Visit: Payer: Self-pay | Admitting: Family Medicine

## 2007-12-08 ENCOUNTER — Ambulatory Visit: Payer: Self-pay | Admitting: Family Medicine

## 2007-12-22 ENCOUNTER — Ambulatory Visit: Payer: Self-pay | Admitting: Family Medicine

## 2008-01-05 ENCOUNTER — Ambulatory Visit: Payer: Self-pay | Admitting: Family Medicine

## 2008-01-19 ENCOUNTER — Ambulatory Visit: Payer: Self-pay | Admitting: Family Medicine

## 2008-02-02 ENCOUNTER — Ambulatory Visit: Payer: Self-pay | Admitting: Family Medicine

## 2008-02-22 ENCOUNTER — Encounter: Payer: Self-pay | Admitting: Family Medicine

## 2008-02-22 ENCOUNTER — Other Ambulatory Visit: Admission: RE | Admit: 2008-02-22 | Discharge: 2008-02-22 | Payer: Self-pay | Admitting: Obstetrics & Gynecology

## 2008-02-22 LAB — CONVERTED CEMR LAB: Vit D, 25-Hydroxy: 39 ng/mL

## 2008-02-23 ENCOUNTER — Ambulatory Visit: Payer: Self-pay | Admitting: Family Medicine

## 2008-02-26 ENCOUNTER — Encounter: Admission: RE | Admit: 2008-02-26 | Discharge: 2008-02-26 | Payer: Self-pay | Admitting: Obstetrics & Gynecology

## 2008-03-08 ENCOUNTER — Ambulatory Visit: Payer: Self-pay | Admitting: Family Medicine

## 2008-03-23 ENCOUNTER — Encounter: Payer: Self-pay | Admitting: Family Medicine

## 2008-03-23 LAB — CONVERTED CEMR LAB
BUN: 5 mg/dL
Glucose, Bld: 92 mg/dL

## 2008-04-05 ENCOUNTER — Ambulatory Visit: Payer: Self-pay | Admitting: Family Medicine

## 2008-04-06 ENCOUNTER — Encounter: Payer: Self-pay | Admitting: Family Medicine

## 2008-04-06 LAB — CONVERTED CEMR LAB
BUN: 11 mg/dL
Creatinine, Ser: 1.1 mg/dL
Ferritin: 19.5 ng/mL
Potassium: 5 meq/L
Saturation Ratios: 15 %
Sodium: 138 meq/L

## 2008-04-12 ENCOUNTER — Encounter: Payer: Self-pay | Admitting: Family Medicine

## 2008-04-19 ENCOUNTER — Ambulatory Visit: Payer: Self-pay | Admitting: Family Medicine

## 2008-05-03 ENCOUNTER — Ambulatory Visit: Payer: Self-pay | Admitting: Family Medicine

## 2008-05-17 ENCOUNTER — Ambulatory Visit: Payer: Self-pay | Admitting: Family Medicine

## 2008-05-17 ENCOUNTER — Encounter: Payer: Self-pay | Admitting: Family Medicine

## 2008-05-17 DIAGNOSIS — I951 Orthostatic hypotension: Secondary | ICD-10-CM | POA: Insufficient documentation

## 2008-05-17 HISTORY — DX: Orthostatic hypotension: I95.1

## 2008-05-31 ENCOUNTER — Ambulatory Visit: Payer: Self-pay | Admitting: Family Medicine

## 2008-06-14 ENCOUNTER — Ambulatory Visit: Payer: Self-pay | Admitting: Family Medicine

## 2008-06-28 ENCOUNTER — Ambulatory Visit: Payer: Self-pay | Admitting: Family Medicine

## 2008-06-28 ENCOUNTER — Encounter: Payer: Self-pay | Admitting: Family Medicine

## 2008-07-14 ENCOUNTER — Ambulatory Visit: Payer: Self-pay | Admitting: Family Medicine

## 2008-07-26 ENCOUNTER — Ambulatory Visit: Payer: Self-pay | Admitting: Family Medicine

## 2008-08-09 ENCOUNTER — Ambulatory Visit: Payer: Self-pay | Admitting: Family Medicine

## 2008-08-11 ENCOUNTER — Ambulatory Visit (HOSPITAL_COMMUNITY): Admission: RE | Admit: 2008-08-11 | Discharge: 2008-08-11 | Payer: Self-pay | Admitting: Gastroenterology

## 2008-08-24 ENCOUNTER — Ambulatory Visit: Payer: Self-pay | Admitting: Family Medicine

## 2008-09-06 ENCOUNTER — Ambulatory Visit: Payer: Self-pay | Admitting: Family Medicine

## 2008-09-20 ENCOUNTER — Ambulatory Visit: Payer: Self-pay | Admitting: Family Medicine

## 2008-10-04 ENCOUNTER — Ambulatory Visit: Payer: Self-pay | Admitting: Family Medicine

## 2008-10-18 ENCOUNTER — Ambulatory Visit: Payer: Self-pay | Admitting: Family Medicine

## 2008-10-24 ENCOUNTER — Encounter: Admission: RE | Admit: 2008-10-24 | Discharge: 2008-10-24 | Payer: Self-pay | Admitting: Family Medicine

## 2008-11-16 ENCOUNTER — Ambulatory Visit: Payer: Self-pay | Admitting: Family Medicine

## 2008-11-21 ENCOUNTER — Encounter: Payer: Self-pay | Admitting: Family Medicine

## 2008-11-21 LAB — CONVERTED CEMR LAB
Ferritin: 13 ng/mL
Saturation Ratios: 12 %

## 2008-11-29 ENCOUNTER — Ambulatory Visit: Payer: Self-pay | Admitting: Family Medicine

## 2008-12-01 ENCOUNTER — Encounter: Payer: Self-pay | Admitting: Family Medicine

## 2008-12-20 ENCOUNTER — Ambulatory Visit: Payer: Self-pay | Admitting: Family Medicine

## 2009-01-17 ENCOUNTER — Ambulatory Visit: Payer: Self-pay | Admitting: Family Medicine

## 2009-01-30 ENCOUNTER — Ambulatory Visit: Payer: Self-pay | Admitting: Family Medicine

## 2009-02-14 ENCOUNTER — Ambulatory Visit: Payer: Self-pay | Admitting: Family Medicine

## 2009-02-27 ENCOUNTER — Ambulatory Visit: Payer: Self-pay | Admitting: Family Medicine

## 2009-02-28 ENCOUNTER — Encounter: Admission: RE | Admit: 2009-02-28 | Discharge: 2009-02-28 | Payer: Self-pay | Admitting: Obstetrics and Gynecology

## 2009-03-13 ENCOUNTER — Ambulatory Visit: Payer: Self-pay | Admitting: Family Medicine

## 2009-03-28 ENCOUNTER — Ambulatory Visit: Payer: Self-pay | Admitting: Family Medicine

## 2009-04-04 ENCOUNTER — Encounter: Payer: Self-pay | Admitting: Family Medicine

## 2009-04-17 ENCOUNTER — Ambulatory Visit: Payer: Self-pay | Admitting: Family Medicine

## 2009-05-01 ENCOUNTER — Ambulatory Visit: Payer: Self-pay | Admitting: Family Medicine

## 2009-05-22 ENCOUNTER — Ambulatory Visit: Payer: Self-pay | Admitting: Family Medicine

## 2009-06-05 ENCOUNTER — Ambulatory Visit: Payer: Self-pay | Admitting: Family Medicine

## 2009-06-19 ENCOUNTER — Ambulatory Visit: Payer: Self-pay | Admitting: Family Medicine

## 2009-07-04 ENCOUNTER — Ambulatory Visit: Payer: Self-pay | Admitting: Family Medicine

## 2009-07-24 ENCOUNTER — Ambulatory Visit: Payer: Self-pay | Admitting: Family Medicine

## 2009-08-21 ENCOUNTER — Ambulatory Visit: Payer: Self-pay | Admitting: Family Medicine

## 2009-09-13 ENCOUNTER — Encounter: Payer: Self-pay | Admitting: Family Medicine

## 2009-09-13 LAB — CONVERTED CEMR LAB
HCT: 39.4 %
Hemoglobin: 13.3 g/dL
Hgb A1c MFr Bld: 5.5 %
LDL Cholesterol: 118 mg/dL
Vit D, 25-Hydroxy: 29.3 ng/mL

## 2009-09-18 ENCOUNTER — Ambulatory Visit: Payer: Self-pay | Admitting: Family Medicine

## 2009-09-19 ENCOUNTER — Encounter: Payer: Self-pay | Admitting: Family Medicine

## 2009-09-19 LAB — CONVERTED CEMR LAB: TSH: 3.67 microintl units/mL

## 2009-09-21 ENCOUNTER — Ambulatory Visit (HOSPITAL_COMMUNITY): Admission: RE | Admit: 2009-09-21 | Discharge: 2009-09-21 | Payer: Self-pay | Admitting: Gastroenterology

## 2009-10-12 ENCOUNTER — Ambulatory Visit: Payer: Self-pay | Admitting: Family Medicine

## 2009-10-30 ENCOUNTER — Ambulatory Visit: Payer: Self-pay | Admitting: Family Medicine

## 2009-10-30 ENCOUNTER — Encounter: Admission: RE | Admit: 2009-10-30 | Discharge: 2009-10-30 | Payer: Self-pay | Admitting: Family Medicine

## 2009-10-30 DIAGNOSIS — M549 Dorsalgia, unspecified: Secondary | ICD-10-CM | POA: Insufficient documentation

## 2009-10-30 DIAGNOSIS — IMO0002 Reserved for concepts with insufficient information to code with codable children: Secondary | ICD-10-CM | POA: Insufficient documentation

## 2009-10-30 DIAGNOSIS — M1710 Unilateral primary osteoarthritis, unspecified knee: Secondary | ICD-10-CM

## 2009-10-31 ENCOUNTER — Telehealth: Payer: Self-pay | Admitting: Family Medicine

## 2009-10-31 ENCOUNTER — Ambulatory Visit: Payer: Self-pay | Admitting: Family Medicine

## 2009-11-20 ENCOUNTER — Ambulatory Visit: Payer: Self-pay | Admitting: Sports Medicine

## 2009-11-20 ENCOUNTER — Ambulatory Visit: Payer: Self-pay | Admitting: Family Medicine

## 2009-12-11 ENCOUNTER — Ambulatory Visit: Payer: Self-pay | Admitting: Family Medicine

## 2009-12-12 ENCOUNTER — Ambulatory Visit: Payer: Self-pay | Admitting: Family Medicine

## 2010-01-29 ENCOUNTER — Telehealth (INDEPENDENT_AMBULATORY_CARE_PROVIDER_SITE_OTHER): Payer: Self-pay | Admitting: *Deleted

## 2010-01-29 ENCOUNTER — Ambulatory Visit: Payer: Self-pay | Admitting: Family Medicine

## 2010-02-14 ENCOUNTER — Encounter
Admission: RE | Admit: 2010-02-14 | Discharge: 2010-02-14 | Payer: Self-pay | Source: Home / Self Care | Attending: Surgery | Admitting: Surgery

## 2010-02-19 ENCOUNTER — Ambulatory Visit: Admission: RE | Admit: 2010-02-19 | Discharge: 2010-02-19 | Payer: Self-pay | Source: Home / Self Care

## 2010-02-26 ENCOUNTER — Ambulatory Visit
Admission: RE | Admit: 2010-02-26 | Discharge: 2010-02-26 | Payer: Self-pay | Source: Home / Self Care | Attending: Family Medicine | Admitting: Family Medicine

## 2010-02-26 ENCOUNTER — Encounter: Payer: Self-pay | Admitting: Family Medicine

## 2010-03-05 ENCOUNTER — Encounter: Payer: Self-pay | Admitting: Family Medicine

## 2010-03-05 ENCOUNTER — Ambulatory Visit
Admission: RE | Admit: 2010-03-05 | Discharge: 2010-03-05 | Payer: Self-pay | Source: Home / Self Care | Attending: Family Medicine | Admitting: Family Medicine

## 2010-03-06 ENCOUNTER — Encounter
Admission: RE | Admit: 2010-03-06 | Discharge: 2010-03-06 | Payer: Self-pay | Source: Home / Self Care | Attending: Obstetrics & Gynecology | Admitting: Obstetrics & Gynecology

## 2010-03-12 ENCOUNTER — Ambulatory Visit: Admission: RE | Admit: 2010-03-12 | Discharge: 2010-03-12 | Payer: Self-pay | Source: Home / Self Care

## 2010-03-15 NOTE — Assessment & Plan Note (Signed)
Summary: INJECTION IN RT [facility]   Vital Signs:  Patient profile:   55 year old female Pulse rate:   98 / minute BP sitting:   132 / 87  (right arm)  Vitals Entered By: Rochele Pages RN (February 26, 2010 1:48 PM) CC: rt knee pain worse x1 month   CC:  rt knee pain worse x1 month.  History of Present Illness: Right knee pain--did really well with injection (middle ofOctober) until last 2 weeks when pain returned. Was able to do more walking. Left oneis starting to bother her some but not enough yet to consider a shit. Pain is 6-8/10 right, worse after walking standing or stairs. No new injury. No locking or giveing way  Habits & Providers  Alcohol-Tobacco-Diet     Tobacco Status: quit  Current Medications (verified): 1)  Clonazepam 1 Mg Tabs (Clonazepam) .... Am, Noon, and Dinner Time. 2)  Geodon 40 Mg Caps (Ziprasidone Hcl) .... 40 Mg Morning and Dinnertime. 3)  Geodon 80 Mg Caps (Ziprasidone Hcl) .... 80 Mg At Bedtime. 4)  Wellbutrin Xl 150 Mg Xr24h-Tab (Bupropion Hcl) .... Take 1 Pill in The Morning Along With 1 300-Mg Wellbutrin For Total of 450 Mg. 5)  Seroquel Xr 200 Mg Xr24h-Tab (Quetiapine Fumarate) .... 200 Mg (1 Pill) At 3-4 Pm. 6)  Alprazolam 0.5 Mg Tabs (Alprazolam) .... 0.5 Mg (1 Pill) As Needed. 7)  Meloxicam 7.5 Mg Tabs (Meloxicam) .... 7.5 Mg (1 Pill) in The Morning. 8)  Zyrtec Allergy 10 Mg Tabs (Cetirizine Hcl) .Marland Kitchen.. 10 Mg (1 Pill) in The Morning. 9)  Carafate 1 Gm Tabs (Sucralfate) .Marland Kitchen.. 1 G (1 Pill) Morning and Bedtime. 10)  Cvs Daily Multiple Women 50+  Tabs (Multiple Vitamins-Minerals) .Marland Kitchen.. 1 Multivitamin-Mineral Daily. 11)  D3-50 50000 Unit Caps (Cholecalciferol) .... (Actually D2) 50,000 International Units Every Other Week. 12)  Clonazepam Odt 2 Mg Tbdp (Clonazepam) .... 2 Pills At Bedtime. 13)  Cymbalta 30 Mg Cpep (Duloxetine Hcl) .... Take 1 Pill in Am With 2 X 60-Mg Cymbalta For Total of 150 Mg. 14)  Cymbalta 60 Mg Cpep (Duloxetine Hcl) .... Take 2 Pills Am  Along With 1 30-Mg Cymbalta For Total of 150 Mg. 15)  Wellbutrin Xl 300 Mg Xr24h-Tab (Bupropion Hcl) .... Take 1 Pill in The Morning Along With 150-Mg Wellbutrin For Total of 450 Mg. 16)  Trazodone Hcl 150 Mg Tabs (Trazodone Hcl) .... Take 1/2 Pill At Bedtime. 17)  Tramadol Hcl 50 Mg Tabs (Tramadol Hcl) .Marland Kitchen.. 1-2 By Mouth Two Times A Day As Needed Knee Pain  Allergies: 1)  ! * Ms Contin, Percocette 2)  ! * Methadone 3)  ! Macrodantin 4)  Penicillin 5)  Aspirin 6)  Biaxin  Review of Systems  The patient denies fever.    Physical Exam  General:  alert, well-developed, well-nourished, and well-hydrated.   Msk:  Right knee lacks full extension by 5 degrees , has full flexion. Mildly TTP lateral joint line and very TTP medial;. No effusion. soft calf. distally neurovascalry inatct Additional Exam:  Patient given informed consent for injection. Discussed possible complications of infection, bleeding or skin atrophy at site of injection. Possible side effect of avascular necrosis (focal area of bone death) due to steroid use.Appropriate verbal time out taken Are cleaned and prepped in usual sterile fashion. A --1-- cc kennalog plus ---4-cc 1% lidocaine without epinephrine was injected into the-right knee usiong antyerior approach--. Patient tolerated procedure well with no complications.    Impression & Recommendations:  Problem # 1:  DEGENERATIVE JOINT DISEASE, KNEES, BILATERAL (ICD-715.96)  Her updated medication list for this problem includes:    Meloxicam 7.5 Mg Tabs (Meloxicam) .Marland Kitchen... 7.5 mg (1 pill) in the morning.    Tramadol Hcl 50 Mg Tabs (Tramadol hcl) .Marland Kitchen... 1-2 by mouth two times a day as needed knee pain  Orders: Joint Aspirate / Injection, Large (20610) Kenalog 10 mg inj (J3301)  Complete Medication List: 1)  Clonazepam 1 Mg Tabs (Clonazepam) .... Am, noon, and dinner time. 2)  Geodon 40 Mg Caps (Ziprasidone hcl) .... 40 mg morning and dinnertime. 3)  Geodon 80 Mg Caps  (Ziprasidone hcl) .... 80 mg at bedtime. 4)  Wellbutrin Xl 150 Mg Xr24h-tab (Bupropion hcl) .... Take 1 pill in the morning along with 1 300-mg wellbutrin for total of 450 mg. 5)  Seroquel Xr 200 Mg Xr24h-tab (Quetiapine fumarate) .... 200 mg (1 pill) at 3-4 pm. 6)  Alprazolam 0.5 Mg Tabs (Alprazolam) .... 0.5 mg (1 pill) as needed. 7)  Meloxicam 7.5 Mg Tabs (Meloxicam) .... 7.5 mg (1 pill) in the morning. 8)  Zyrtec Allergy 10 Mg Tabs (Cetirizine hcl) .Marland Kitchen.. 10 mg (1 pill) in the morning. 9)  Carafate 1 Gm Tabs (Sucralfate) .Marland Kitchen.. 1 g (1 pill) morning and bedtime. 10)  Cvs Daily Multiple Women 50+ Tabs (Multiple vitamins-minerals) .Marland Kitchen.. 1 multivitamin-mineral daily. 11)  D3-50 50000 Unit Caps (Cholecalciferol) .... (actually d2) 50,000 international units every other week. 12)  Clonazepam Odt 2 Mg Tbdp (Clonazepam) .... 2 pills at bedtime. 13)  Cymbalta 30 Mg Cpep (Duloxetine hcl) .... Take 1 pill in am with 2 x 60-mg cymbalta for total of 150 mg. 14)  Cymbalta 60 Mg Cpep (Duloxetine hcl) .... Take 2 pills am along with 1 30-mg cymbalta for total of 150 mg. 15)  Wellbutrin Xl 300 Mg Xr24h-tab (Bupropion hcl) .... Take 1 pill in the morning along with 150-mg wellbutrin for total of 450 mg. 16)  Trazodone Hcl 150 Mg Tabs (Trazodone hcl) .... Take 1/2 pill at bedtime. 17)  Tramadol Hcl 50 Mg Tabs (Tramadol hcl) .Marland Kitchen.. 1-2 by mouth two times a day as needed knee pain   Orders Added: 1)  Est. Patient Level III [16109] 2)  Joint Aspirate / Injection, Large [20610] 3)  Kenalog 10 mg inj [J3301]

## 2010-03-15 NOTE — Progress Notes (Signed)
  Phone Note Outgoing Call   Summary of Call: left mssg Re knee x rays   New Allergies: ! * MS CONTIN, PERCOCETTE ! * METHADONE ! MACRODANTIN PENICILLIN ASPIRIN BIAXIN New Allergies: ! * MS CONTIN, PERCOCETTE ! * METHADONE ! MACRODANTIN PENICILLIN ASPIRIN BIAXIN  Appended Document:  cell [id number]

## 2010-03-15 NOTE — Op Note (Signed)
Summary: CONSENT  CONSENT   Imported By: Marily Memos 02/27/2010 10:01:05  _____________________________________________________________________  External Attachment:    Type:   Image     Comment:   External Document

## 2010-03-15 NOTE — Assessment & Plan Note (Signed)
Summary: NP,KNEE PAIN,MC   Vital Signs:  Patient profile:   55 year old female BP sitting:   125 / 88  Vitals Entered By: Lillia Pauls CMA (October 30, 2009 1:57 PM)  History of Present Illness: 1) Knee pain: B but definitely R>L. Worse since she increased her activities. Now waling 4 days a week and playing tennis 2 hours twice a week. pain immediately after--aching, moderate to severe. Sometimes awakens her from sleep. Getting worse--rest makes it some better. No injury or knee surgery. Takes advil 4 otc tabs q 8 hrs with some relief.  2) low back pain for many years---essentially unchanged in last few months. had MRI in 2008. No loss of bowel or bladder continence. No radiation down her legs. No numbness or tingling. No back surgery or injury.   Habits & Providers  Alcohol-Tobacco-Diet     Tobacco Status: never  Current Medications (verified): 1)  Clonazepam 1 Mg Tabs (Clonazepam) .... Am, Noon, and Dinner Time. 2)  Geodon 40 Mg Caps (Ziprasidone Hcl) .... 40 Mg Morning and Dinnertime. 3)  Geodon 80 Mg Caps (Ziprasidone Hcl) .... 80 Mg At Bedtime. 4)  Wellbutrin Xl 150 Mg Xr24h-Tab (Bupropion Hcl) .... Take 1 Pill in The Morning Along With 1 300-Mg Wellbutrin For Total of 450 Mg. 5)  Seroquel Xr 200 Mg Xr24h-Tab (Quetiapine Fumarate) .... 200 Mg (1 Pill) At 3-4 Pm. 6)  Alprazolam 0.5 Mg Tabs (Alprazolam) .... 0.5 Mg (1 Pill) As Needed. 7)  Meloxicam 7.5 Mg Tabs (Meloxicam) .... 7.5 Mg (1 Pill) in The Morning. 8)  Zyrtec Allergy 10 Mg Tabs (Cetirizine Hcl) .Marland Kitchen.. 10 Mg (1 Pill) in The Morning. 9)  Carafate 1 Gm Tabs (Sucralfate) .Marland Kitchen.. 1 G (1 Pill) Morning and Bedtime. 10)  Cvs Daily Multiple Women 50+  Tabs (Multiple Vitamins-Minerals) .Marland Kitchen.. 1 Multivitamin-Mineral Daily. 11)  D3-50 50000 Unit Caps (Cholecalciferol) .... (Actually D2) 50,000 International Units Every Other Week. 12)  Clonazepam Odt 2 Mg Tbdp (Clonazepam) .... 2 Pills At Bedtime. 13)  Cymbalta 30 Mg Cpep (Duloxetine  Hcl) .... Take 1 Pill in Am With 2 X 60-Mg Cymbalta For Total of 150 Mg. 14)  Cymbalta 60 Mg Cpep (Duloxetine Hcl) .... Take 2 Pills Am Along With 1 30-Mg Cymbalta For Total of 150 Mg. 15)  Wellbutrin Xl 300 Mg Xr24h-Tab (Bupropion Hcl) .... Take 1 Pill in The Morning Along With 150-Mg Wellbutrin For Total of 450 Mg. 16)  Trazodone Hcl 150 Mg Tabs (Trazodone Hcl) .... Take 1/2 Pill At Bedtime. 17)  Tramadol Hcl 50 Mg Tabs (Tramadol Hcl) .Marland Kitchen.. 1-2 By Mouth Two Times A Day As Needed Knee Pain  Past History:  Past Surgical History: no knee or back surgery  Social History: Smoking Status:  never  Review of Systems  The patient denies fever, weight loss, and weight gain.         Please see HPI for additional ROS.   Physical Exam  General:  alert, well-developed, and well-nourished.     Detailed Back/Spine Exam  Lumbosacral Exam:  Inspection-deformity:    Normal Palpation-spinal tenderness:  Normal Sitting Straight Leg Raise:    Right:  negative    Left:  negative     Lower extremity strength 5/5 hip flexors. DTR 2+ B= knees. 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 Lacks full Right knee extension by 5  degrees. Full flexion but pain at last 5 degrees. Ligamntously intact with 3-5 degrees B of MCL laxity. Neg lachman, Negative mcMurrays b. claves B soft.  patellar apprehension negative B, + patellar grind R>L. quad bulk symmetrical. Normal patellar tracking.   Impression & Recommendations:  Problem # 1:  DEGENERATIVE JOINT DISEASE, KNEES, BILATERAL (ICD-715.96)  Her updated medication list for this problem includes:    Meloxicam 7.5 Mg Tabs (Meloxicam) .Marland Kitchen... 7.5 mg (1 pill) in the morning.    Tramadol Hcl 50 Mg Tabs (Tramadol hcl) .Marland Kitchen... 1-2 by mouth two times a day as needed knee pain  Orders: Radiology other (Radiology  Other) Radiology other (Radiology Other) willget x rays to confirm and stage. Rx for knee brace (biotech). For pain, we decided on tramadol--we discussed serotonin syndrome--unlikely but a possibility. will call her with results x rays [phone]  Problem # 2:  BACK PAIN, CHRONIC (ICD-724.5)  Her updated medication list for this problem includes:    Meloxicam 7.5 Mg Tabs (Meloxicam) .Marland Kitchen... 7.5 mg (1 pill) in the morning.    Tramadol Hcl 50 Mg Tabs (Tramadol hcl) .Marland Kitchen... 1-2 by mouth two times a day as needed knee pain reviewed MRI from 2008  Complete Medication List: 1)  Clonazepam 1 Mg Tabs (Clonazepam) .... Am, noon, and dinner time. 2)  Geodon 40 Mg Caps (Ziprasidone hcl) .... 40 mg morning and dinnertime. 3)  Geodon 80 Mg Caps (Ziprasidone hcl) .... 80 mg at bedtime. 4)  Wellbutrin Xl 150 Mg Xr24h-tab (Bupropion hcl) .... Take 1 pill in the morning along with 1 300-mg wellbutrin for total of 450 mg. 5)  Seroquel Xr 200 Mg Xr24h-tab (Quetiapine fumarate) .... 200 mg (1 pill) at 3-4 pm. 6)  Alprazolam 0.5 Mg Tabs (Alprazolam) .... 0.5 mg (1 pill) as needed. 7)  Meloxicam 7.5 Mg Tabs (Meloxicam) .... 7.5 mg (1 pill) in the morning. 8)  Zyrtec Allergy 10 Mg Tabs (Cetirizine hcl) .Marland Kitchen.. 10 mg (1 pill) in the morning. 9)  Carafate 1 Gm Tabs (Sucralfate) .Marland Kitchen.. 1 g (1 pill) morning and bedtime. 10)  Cvs Daily Multiple Women 50+ Tabs (Multiple vitamins-minerals) .Marland Kitchen.. 1 multivitamin-mineral daily. 11)  D3-50 50000 Unit Caps (Cholecalciferol) .... (actually d2) 50,000 international units every other week. 12)  Clonazepam Odt 2 Mg Tbdp (Clonazepam) .... 2 pills at bedtime. 13)  Cymbalta 30 Mg Cpep (Duloxetine hcl) .... Take 1 pill in am with 2 x 60-mg cymbalta for total of 150 mg. 14)  Cymbalta 60 Mg Cpep (Duloxetine hcl) .... Take 2 pills am along with 1 30-mg cymbalta for total of 150 mg. 15)  Wellbutrin Xl 300 Mg Xr24h-tab (Bupropion hcl) .... Take 1 pill in the morning along with 150-mg wellbutrin for  total of 450 mg. 16)  Trazodone Hcl 150 Mg Tabs (Trazodone hcl) .... Take 1/2 pill at bedtime. 17)  Tramadol Hcl 50 Mg Tabs (Tramadol hcl) .Marland Kitchen.. 1-2 by mouth two times a day as needed knee pain Prescriptions: TRAMADOL HCL 50 MG TABS (TRAMADOL HCL) 1-2 by mouth two times a day as needed knee pain  #60 x 3   Entered and Authorized by:   Denny Levy MD   Signed by:   Denny Levy MD on 10/30/2009   Method used:   Handwritten   RxID:   [id number]

## 2010-03-15 NOTE — Assessment & Plan Note (Signed)
Summary: 10:30 appt with Tyrease Vandeberg/eo  Nurse Visit   Vital Signs:  Patient profile:   55 year old female Height:      60.5 inches Weight:      161.4 pounds BMI:     31.11  Vitals Entered By: Wyona Almas PHD (August 21, 2009 10:45 AM)  History of Present Illness: Assessment:  Spent  30 minutes with pt.  Carolin has not been able to motivate herself to exercise regularly.  In the past month, she has walked or played tennis only 8 times.  She has been spending most of her time at her parents' house, helping her mother, who is still recuperating from hip replacement.  Food choices have not changed much; most days include 3 meals, although there were several "meals" that consisted of only one item, i.e., squash, Power Bar, or tuna.  Average kcal intake was 710 with a huge range of 230 (ate only cereal, fruit & soymilk) to 1500 kcal.  Protein intake averaged just under 40 g/day.  Kanisha continues to avoid diet (or any) soda, and is usually drinking 12 c water/day.  Tonni agreed to the plan of exercising before she goes to her parents' house each day.    Nutrition Diagnosis:  No progress on inadequate oral food intake (NI-2.1) related to poor appetite and food anxiety as evidenced by average kcal intake of 710 kcal, and one day of only 230 kcal.  No progress on physical inactivity (NB-2.1) related to fatigue as evidenced by exercise 8 X in the past 28 days.  Disordered eating pattern (NB-1.5) related to recurrent food restriction as evidenced by self-reported continued weight anxiety and consequent restricted intake.   Intervention:  See Pt Instructions.    Monitoring/Eval: Dietary intake, body weight, and exercise at 3-wk F/U.     Patient Instructions: 1)  Ex goal:  at least 30 min 5 X wk STARTING TOMORROW.  2)  REMEMBER:   Use the 5-minute rule.   3)  Weather concerns:  Look at the forecast the day before; [facility]., get [facility] earlier.   4)  Email [name] once a  week re. exercise that week.   5)  Food records: On days that are unusual, WRITE DOWN WHY.   6)  Make a list of foods/meals that are quick and easy and good for you for those times you are tired.  Bring to next appt.   7)  Please schedule an appt for 3 wks.    8)  AT F/U DISCUSS INCONSISTENT KCAL INTAKE AS WELL AS VIT D SUPPLEMENTATION.     Orders Added: 1)  Reassessment Each 15 min unit- [facility] [16109]

## 2010-03-15 NOTE — Assessment & Plan Note (Signed)
Summary: F/U per Becky [name] / JCS   Vital Signs:  Patient profile:   55 year old female Height:      60.5 inches Weight:      155.5 pounds BMI:     29.98  Vitals Entered By: Becky [name] (Jun 19, 2009 10:38 AM)  History of Present Illness: Assessment:  Spent 30 minutes w/ pt.  Becky [name] has been living at her parents' home for the past several days, helping her mom, who had a hip replacement on May 2.  During her mom's hospitalization, Becky [name] ate at the hospital cafeteria, or used protein bars as meal replacements (20 bars over 14 days).  Caring for her mom kept her more active, although she also managed to exercise 9 of 14 days.  Food records indicate an average intake of 561 kcal/day (range [phone]).  Low-intake days were when Becky [name]'s mom was in the hospital, i.e., 1 bar for breakfast and for lunch; dinner of 1 soy burger w/ no bun = 630 kcal.  When there is much going on in Becky [name]'s life, food planning and eating take low priority.    Nutrition Diagnosis:  Some regression on inadequate oral food intake (NI-2.1) related to poor appetite and food anxiety as evidenced by lower average intake and increased inconsistencies.  Stable progress on physical inactivity (NB-2.1) related to fatigue as evidenced by exercise 9 X in the past 2 wks.  Disordered eating pattern (NB-1.5) related to recurrent food restriction as evidenced by self-reported continued weight anxiety and consequent restricted intake.   Intervention:  See Pt Instructions.    Monitoring/Eval: Dietary intake, body weight, and exercise at 2-wk F/U.     Complete Medication List: 1)  Clonazepam 1 Mg Tabs (Clonazepam) .... Am, noon, and dinner time. 2)  Geodon 40 Mg Caps (Ziprasidone hcl) .... 40 mg morning and dinnertime. 3)  Geodon 80 Mg Caps (Ziprasidone hcl) .... 80 mg at bedtime. 4)  Wellbutrin Xl 150 Mg Xr24h-tab (Bupropion hcl) .... Take 1 pill in the morning along with 1 300-mg wellbutrin for total of 450 mg. 5)   Seroquel Xr 200 Mg Xr24h-tab (Quetiapine fumarate) .... 200 mg (1 pill) at 3-4 pm. 6)  Alprazolam 0.5 Mg Tabs (Alprazolam) .... 0.5 mg (1 pill) as needed. 7)  Meloxicam 7.5 Mg Tabs (Meloxicam) .... 7.5 mg (1 pill) in the morning. 8)  Zyrtec Allergy 10 Mg Tabs (Cetirizine hcl) .Marland Kitchen.. 10 mg (1 pill) in the morning. 9)  Carafate 1 Gm Tabs (Sucralfate) .Marland Kitchen.. 1 g (1 pill) morning and bedtime. 10)  Cvs Daily Multiple Women 50+ Tabs (Multiple vitamins-minerals) .Marland Kitchen.. 1 multivitamin-mineral daily. 11)  D3-50 50000 Unit Caps (Cholecalciferol) .... (actually d2) 50,000 international units every other week. 12)  Clonazepam Odt 2 Mg Tbdp (Clonazepam) .... 2 pills at bedtime. 13)  Cymbalta 30 Mg Cpep (Duloxetine hcl) .... Take 1 pill in am with 2 x 60-mg cymbalta for total of 150 mg. 14)  Cymbalta 60 Mg Cpep (Duloxetine hcl) .... Take 2 pills am along with 1 30-mg cymbalta for total of 150 mg. 15)  Wellbutrin Xl 300 Mg Xr24h-tab (Bupropion hcl) .... Take 1 pill in the morning along with 150-mg wellbutrin for total of 450 mg. 16)  Trazodone Hcl 150 Mg Tabs (Trazodone hcl) .... Take 1/2 pill at bedtime.  [facility]  Patient Instructions: 1)  MORE VEG'S; include at both lunch and dinner.  2)  More whole, real food instead of relying on  Power Bars, Catering manager.   3)  Exercise goal is the same:  At least 30 min at least 5 X wk.

## 2010-03-15 NOTE — Assessment & Plan Note (Signed)
Summary: Seeing Becky [name] @ 10:30 / [facility]CS   Vital Signs:  Patient profile:   55 year old female Height:      60.5 inches Weight:      156.4 pounds BMI:     30.15  Vitals Entered By: Wyona Almas PHD (Jul 04, 2009 10:30 AM)  History of Present Illness: Assessment:  Spent 30 with pt.  Becky [name] has been drinking up to 6 12-oz diet sodas daily for several weeks now.  She is concerned that these may be making weight loss harder.  She increased veg intake last week, but still had 9 meals (out of 30 lunches & dinners) at which she had no veg's.  Protein intake averaged only 44 g/day, and kcal averaged 865 (range [phone]).  As recommended, more of Becky [name]'s intake was comprised of real food vs. snack bars (only 4 bars in 15 days).  Exercise was reduced from usual since Becky [name] is still staying at her parents' house, helping her mom in her hip replacement recovery.  We discussed today the idea of cutting back on the frequency of her appts, which I have talked to [place] about before.  When last discussed, she was reluctant to decrease frequency of visits, as it was when winter was approaching, and she struggles even more with depression, appetite, and even in some years, suicidal ideations.  She felt strongly that she needed as much contact with her health care team as possible.  She is currently doing better in terms of depression, though, and is agreeable to cutting back on nutr appts.  I asked her to consider if improvement in her depression may be partly related to caring for her mom, and having a purpose each day.  She will talk with her therapist Aurea Graff (again) about trying to find something that engages her and that gives her meaning each day/week.    Nutrition Diagnosis:  Some progress on inadequate oral food intake (NI-2.1) related to poor appetite and food anxiety as evidenced byincreased kcal intake of 865 kcal.  No progress on physical inactivity (NB-2.1) related to fatigue as evidenced by exercise 6  X in the past 15 days.  Disordered eating pattern (NB-1.5) related to recurrent food restriction as evidenced by self-reported continued weight anxiety and consequent restricted intake.   Intervention:  See Pt Instructions.    Monitoring/Eval: Dietary intake, body weight, and exercise at 2-wk F/U.     Complete Medication List: 1)  Clonazepam 1 Mg Tabs (Clonazepam) .... Am, noon, and dinner time. 2)  Geodon 40 Mg Caps (Ziprasidone hcl) .... 40 mg morning and dinnertime. 3)  Geodon 80 Mg Caps (Ziprasidone hcl) .... 80 mg at bedtime. 4)  Wellbutrin Xl 150 Mg Xr24h-tab (Bupropion hcl) .... Take 1 pill in the morning along with 1 300-mg wellbutrin for total of 450 mg. 5)  Seroquel Xr 200 Mg Xr24h-tab (Quetiapine fumarate) .... 200 mg (1 pill) at 3-4 pm. 6)  Alprazolam 0.5 Mg Tabs (Alprazolam) .... 0.5 mg (1 pill) as needed. 7)  Meloxicam 7.5 Mg Tabs (Meloxicam) .... 7.5 mg (1 pill) in the morning. 8)  Zyrtec Allergy 10 Mg Tabs (Cetirizine hcl) .Marland Kitchen.. 10 mg (1 pill) in the morning. 9)  Carafate 1 Gm Tabs (Sucralfate) .Marland Kitchen.. 1 g (1 pill) morning and bedtime. 10)  Cvs Daily Multiple Women 50+ Tabs (Multiple vitamins-minerals) .Marland Kitchen.. 1 multivitamin-mineral daily. 11)  D3-50 50000 Unit Caps (Cholecalciferol) .... (actually d2) 50,000 international units every other week. 12)  Clonazepam Odt 2 Mg Tbdp (Clonazepam) .Marland KitchenMarland KitchenMarland Kitchen  2 pills at bedtime. 13)  Cymbalta 30 Mg Cpep (Duloxetine hcl) .... Take 1 pill in am with 2 x 60-mg cymbalta for total of 150 mg. 14)  Cymbalta 60 Mg Cpep (Duloxetine hcl) .... Take 2 pills am along with 1 30-mg cymbalta for total of 150 mg. 15)  Wellbutrin Xl 300 Mg Xr24h-tab (Bupropion hcl) .... Take 1 pill in the morning along with 150-mg wellbutrin for total of 450 mg. 16)  Trazodone Hcl 150 Mg Tabs (Trazodone hcl) .... Take 1/2 pill at bedtime.  [facility]  Patient Instructions: 1)  VEG'S with both lunch and dinner!  Remember the  microwave for frozen veg's if you're in a hurry or have limited time.   2)  Protein:  55 grams per day is the goal!! 3)  On days you are out before lunch (or any meal), bring a snack in case of delay.  Delay is VERY likely on days when you're going to the doctor.  No more than 5 hours between eating.  On days you see Aurea Graff, bring a sandwich and fruit in the car.   4)  Talk with Aurea Graff about looking for something you can pursue that truly engages you, and gives you a sense of purpose.   5)  Exercise goal:  30 min, 5 X wk.  AND:  Continue to be more active throughout the day.   6)  Reduce diet sodas, and let's see what happens with your weight.  7)  Follow-up appt:  June 13 (and July 11) both at 10:30.

## 2010-03-15 NOTE — Assessment & Plan Note (Signed)
Summary: FU/KH   Vital Signs:  Patient profile:   55 year old female Height:      60.5 inches Weight:      148.3 pounds BMI:     28.59  Vitals Entered By: Wyona Almas PHD (April 17, 2009 10:47 AM)  History of Present Illness: Assessment: Spent 30 minutes with patient.  Sarinah started taking Lunesta to help her sleep  ~2 1/2 weeks ago, and she started 15 mg Deplin folate supplement on Mar 4th.  She said she is sleeping better since starting Lunesta, but she still has low energy levels, and continues to struggle with depression.  Food records suggest an average intake of 695 kcal (range [phone]), and over the last 20 days, Marshell ate only 2 X day on 8 days.  Vegetable intake was limited to 13 X, and fruit was limited to 5 bananas in the past 20 days.  Protein intake has averaged <40 g/day.  Consandra was using Power Bars (23 g protein, 300 kcal) as  meal replacement a number of times.    Nutrition Diagnosis: Slight progress on inadequate oral food intake (NI-2.1) related to poor appetite as evidenced by average kcal intake of 695 (range [phone]).  No meaningful progress on physical inactivity (NB-2.1) related to fatigue as evidenced by exercise 5 X in the past 20 days.  Disordered eating pattern (NB-1.5) related to recurrent food restriction as evidenced by self-reported continued weight anxiety and consequent restricted intake.   Intervention:  See Pt Instructions.    Monitoring/Eval: Dietary intake, body weight, and exercise at 3-wk F/U.     Complete Medication List: 1)  Clonazepam 1 Mg Tabs (Clonazepam) .... Am, noon, and dinner time. 2)  Geodon 40 Mg Caps (Ziprasidone hcl) .... 40 mg morning and dinnertime. 3)  Geodon 80 Mg Caps (Ziprasidone hcl) .... 80 mg at bedtime. 4)  Wellbutrin Xl 150 Mg Xr24h-tab (Bupropion hcl) .... Take 1 pill in the morning along with 1 300-mg wellbutrin for total of 450 mg. 5)  Seroquel Xr 200 Mg Xr24h-tab (Quetiapine fumarate) .... 200 mg (1 pill) at  3-4 pm. 6)  Alprazolam 0.5 Mg Tabs (Alprazolam) .... 0.5 mg (1 pill) as needed. 7)  Meloxicam 7.5 Mg Tabs (Meloxicam) .... 7.5 mg (1 pill) in the morning. 8)  Zyrtec Allergy 10 Mg Tabs (Cetirizine hcl) .Marland Kitchen.. 10 mg (1 pill) in the morning. 9)  Carafate 1 Gm Tabs (Sucralfate) .Marland Kitchen.. 1 g (1 pill) morning and bedtime. 10)  Cvs Daily Multiple Women 50+ Tabs (Multiple vitamins-minerals) .Marland Kitchen.. 1 multivitamin-mineral daily. 11)  D3-50 50000 Unit Caps (Cholecalciferol) .... (actually d2) 50,000 international units every other week. 12)  Clonazepam Odt 2 Mg Tbdp (Clonazepam) .... 2 pills at bedtime. 13)  Cymbalta 30 Mg Cpep (Duloxetine hcl) .... Take 1 pill in am with 2 x 60-mg cymbalta for total of 150 mg. 14)  Cymbalta 60 Mg Cpep (Duloxetine hcl) .... Take 2 pills am along with 1 30-mg cymbalta for total of 150 mg. 15)  Wellbutrin Xl 300 Mg Xr24h-tab (Bupropion hcl) .... Take 1 pill in the morning along with 150-mg wellbutrin for total of 450 mg. 16)  Trazodone Hcl 150 Mg Tabs (Trazodone hcl) .... Take 1/2 pill at bedtime.  Other Orders: Reassessment Each 15 min unit- [facility] [id number])  Patient Instructions: 1)  Call walking group, and plan to join them at your next opportunity.   2)  Food goals:  (a) Eat at least 3 meals and 1 snack per day; (b)  Veg's (and/or fruit) at least twice a day; (c) Balance each meal to include some protein and some starch.  3)  Physical activity:  Again, aim for at least 5 X wk walking.   4)  Let me know what's decided re. your vitamin D supplement.
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
## 2010-03-15 NOTE — Assessment & Plan Note (Signed)
Summary: FU/KH   Vital Signs:  Patient profile:   55 year old female Height:      60.5 inches Weight:      154.3 pounds BMI:     29.75  Vitals Entered By: Wyona Almas PHD (May 01, 2009 10:47 AM)  History of Present Illness: Assessment: Spent 30 minutes with patient.  Becky [name] has been taking Deplin for  ~3 wk now, prescribed by her psychiatrist Phillip Heal, who hoped it may help improve Becky [name]'s mood some.  So far, she has noticed no difference.  Becky [name] remains very depressed, and finds it difficult to exercise or to make good food choices.  Her food record showed numerous peanut butter sandwiches and few fruits or vegetables.  Average intake per the 2-wk food record was  ~900 kcal, with a range of 300 - 1370 kcal.  Becky [name] is going to start taking Replesta vit D3 50,000 International Units; her vitamin D level was down to 22 on Feb 22.    Nutrition Diagnosis: No progress on inadequate oral food intake (NI-2.1) related to poor appetite as evidenced by erratic kcal intake of (average of 900, but range of [phone]).  No meaningful progress on physical inactivity (NB-2.1) related to fatigue as evidenced by exercise (tennis) 1 X in the past 14 days.  Disordered eating pattern (NB-1.5) related to recurrent food restriction as evidenced by self-reported continued weight anxiety and consequent restricted intake.   Intervention:  See Pt Instructions.    Monitoring/Eval: Dietary intake, body weight, and exercise at 3-wk F/U.     Complete Medication List: 1)  Clonazepam 1 Mg Tabs (Clonazepam) .... Am, noon, and dinner time. 2)  Geodon 40 Mg Caps (Ziprasidone hcl) .... 40 mg morning and dinnertime. 3)  Geodon 80 Mg Caps (Ziprasidone hcl) .... 80 mg at bedtime. 4)  Wellbutrin Xl 150 Mg Xr24h-tab (Bupropion hcl) .... Take 1 pill in the morning along with 1 300-mg wellbutrin for total of 450 mg. 5)  Seroquel Xr 200 Mg Xr24h-tab (Quetiapine fumarate) .... 200 mg (1 pill) at 3-4 pm. 6)   Alprazolam 0.5 Mg Tabs (Alprazolam) .... 0.5 mg (1 pill) as needed. 7)  Meloxicam 7.5 Mg Tabs (Meloxicam) .... 7.5 mg (1 pill) in the morning. 8)  Zyrtec Allergy 10 Mg Tabs (Cetirizine hcl) .Marland Kitchen.. 10 mg (1 pill) in the morning. 9)  Carafate 1 Gm Tabs (Sucralfate) .Marland Kitchen.. 1 g (1 pill) morning and bedtime. 10)  Cvs Daily Multiple Women 50+ Tabs (Multiple vitamins-minerals) .Marland Kitchen.. 1 multivitamin-mineral daily. 11)  D3-50 50000 Unit Caps (Cholecalciferol) .... (actually d2) 50,000 international units every other week. 12)  Clonazepam Odt 2 Mg Tbdp (Clonazepam) .... 2 pills at bedtime. 13)  Cymbalta 30 Mg Cpep (Duloxetine hcl) .... Take 1 pill in am with 2 x 60-mg cymbalta for total of 150 mg. 14)  Cymbalta 60 Mg Cpep (Duloxetine hcl) .... Take 2 pills am along with 1 30-mg cymbalta for total of 150 mg. 15)  Wellbutrin Xl 300 Mg Xr24h-tab (Bupropion hcl) .... Take 1 pill in the morning along with 150-mg wellbutrin for total of 450 mg. 16)  Trazodone Hcl 150 Mg Tabs (Trazodone hcl) .... Take 1/2 pill at bedtime.  [id number]  Patient Instructions: 1)  Eat at least 3 meals and 1-2 snacks per day. 2)  Get adequate protein daily:  At least 55 grams per day!  Include a major protein source with each meal.   3)  Physical activity:  Same goal of 30 minutes 5 X wk (plus tennis 1-2 X wk).   4)  Take your vitamin D3 (Replesta) Rx (50,000 IU) once a week, as directed.

## 2010-03-15 NOTE — Assessment & Plan Note (Signed)
Summary: F/U per Gerilyn Pilgrim / JCS   Vital Signs:  Patient profile:   55 year old female Height:      60.5 inches Weight:      145.0 pounds BMI:     27.95  Vitals Entered By: Wyona Almas PHD (March 28, 2009 10:31 AM)  History of Present Illness: Assessment: Spent 30 minutes with patient.  Gizell's depression seems to have worsened.  She saw her therapist [name] last week, and is scheduled to see psychiatrist Phillip Heal on Thursday.  Annalei's food records indicate that she is eating 2-3 meals/day, but some meals are only  ~100 kcal, i.e., Cameron Ali or canned veg soup.  Average estimated intake was only 510 kcal w/ a range of 200-840 (and one day of no intake d/t stomach virus).  Devlynn has played tennis 90 min each of the last 2 Fridays, but has not been motivated to walk.  Her energy level is very low, not surprisingly with such a low kcal intake.    Nutrition Diagnosis:  Regression on inadequate oral food intake (NI-2.1) related to poor appetite as evidenced by average kcal intake of  ~510 (range 200-840).  Regression on physical inactivity (NB-2.1) related to fatigue as evidenced by exercise 2 X in the past 15 days.  Disordered eating pattern (NB-1.5) related to recurrent food restriction as evidenced by self-reported continued weight anxiety and consequent restricted intake.   Intervention:  See Pt Instructions.    Monitoring/Eval: Dietary intake, body weight, and exercise at 3-wk F/U.      Complete Medication List: 1)  Clonazepam 1 Mg Tabs (Clonazepam) .... Am, noon, and dinner time. 2)  Geodon 40 Mg Caps (Ziprasidone hcl) .... 40 mg morning and dinnertime. 3)  Geodon 80 Mg Caps (Ziprasidone hcl) .... 80 mg at bedtime. 4)  Wellbutrin Xl 150 Mg Xr24h-tab (Bupropion hcl) .... Take 1 pill in the morning along with 1 300-mg wellbutrin for total of 450 mg. 5)  Seroquel Xr 200 Mg Xr24h-tab (Quetiapine fumarate) .... 200 mg (1 pill) at 3-4 pm. 6)  Alprazolam 0.5 Mg Tabs  (Alprazolam) .... 0.5 mg (1 pill) as needed. 7)  Meloxicam 7.5 Mg Tabs (Meloxicam) .... 7.5 mg (1 pill) in the morning. 8)  Zyrtec Allergy 10 Mg Tabs (Cetirizine hcl) .Marland Kitchen.. 10 mg (1 pill) in the morning. 9)  Carafate 1 Gm Tabs (Sucralfate) .Marland Kitchen.. 1 g (1 pill) morning and bedtime. 10)  Cvs Daily Multiple Women 50+ Tabs (Multiple vitamins-minerals) .Marland Kitchen.. 1 multivitamin-mineral daily. 11)  D3-50 50000 Unit Caps (Cholecalciferol) .... (actually d2) 50,000 international units every other week. 12)  Clonazepam Odt 2 Mg Tbdp (Clonazepam) .... 2 pills at bedtime. 13)  Cymbalta 30 Mg Cpep (Duloxetine hcl) .... Take 1 pill in am with 2 x 60-mg cymbalta for total of 150 mg. 14)  Cymbalta 60 Mg Cpep (Duloxetine hcl) .... Take 2 pills am along with 1 30-mg cymbalta for total of 150 mg. 15)  Wellbutrin Xl 300 Mg Xr24h-tab (Bupropion hcl) .... Take 1 pill in the morning along with 150-mg wellbutrin for total of 450 mg. 16)  Trazodone Hcl 150 Mg Tabs (Trazodone hcl) .... Take 1/2 pill at bedtime.  [id number]  Patient Instructions: 1)  Ask Einar Pheasant to walk next weekend.   2)  Goal:  30 min walk 5 X wk:  Pace and distance do not matter! 3)  Three meals a day is the only way you are likely to get  some feeling of energy back.   4)  Call next week no later than Tuesday to report progress.

## 2010-03-15 NOTE — Assessment & Plan Note (Signed)
Summary: 10:30 APPT/TO SEE Becky [name]/EO  Nurse Visit   Vital Signs:  Patient profile:   55 year old female Height:      60.5 inches Weight:      158.2 pounds BMI:     30.50  Vitals Entered By: Wyona Almas PHD (July 24, 2009 10:30 AM)  History of Present Illness: Assessment:  Spent 30 minutes with pt.  Becky [name] has been living at her parents' home for 6 wk now, since her mom's back surgery.  She hopes to get back home soon, though, as her mom is doing much better.  Becky [name] has not been walking much b/c of the heat, but she has continued to play tennis once a week.  Food records suggest an average intake of 900 kcal/day, with a range of [phone].  Average protein intake was  ~42 grams.    Nutrition Diagnosis:  Some progress on inadequate oral food intake (NI-2.1) related to poor appetite and food anxiety as evidenced by increased kcal intake of 900 kcal.  No progress on physical inactivity (NB-2.1) related to fatigue as evidenced by exercise 5 X in the past 20 days.  Disordered eating pattern (NB-1.5) related to recurrent food restriction as evidenced by self-reported continued weight anxiety and consequent restricted intake.   Intervention:  See Pt Instructions.    Monitoring/Eval: Dietary intake, body weight, and exercise at 4-wk F/U.     Patient Instructions: 1)  Protein goal:  At least 55 grams per day!!  Yogurt servings should be at least 6 oz.   2)  Exercise:  Walk in the morning before it's too hot.  Aim for 5 X wk.   3)  Continue lots of fruits and veg's.   4)  Follow-up appt in one month.    Orders Added: 1)  Reassessment Each 15 min unit- [facility] [[id number]]

## 2010-03-15 NOTE — Assessment & Plan Note (Signed)
Summary: F/U per Gerilyn Pilgrim / JCS   Vital Signs:  Patient profile:   55 year old female Height:      60.5 inches Weight:      163.2 pounds BMI:     31.46  Vitals Entered By: Wyona Almas PHD (September 18, 2009 10:34 AM)  History of Present Illness: Assessment:  Spent 30 minutes with pt.  Becky [name] has been feeling even worse due to her allergies in recent weeks.  She saw Dr. Larina Bras last week, but does not yet have lab results.  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  She has been exercising at least 30 min 5 X wk, and food records suggest much greater consistency in her meal composition and kcal levels, so it''s baffling that Becky [name]'s weight keeps going up.  Food records suggest an average intake of  ~767 (range 625-940), with an average protein intake of 49 g/day.  Clearly, Becky [name] is doing better with her food choices, but she will likely need to feel better from her allergies before she can start being more active during the day, in addition to her structured exercise.    Nutrition Diagnosis:  Progress on inadequate oral food intake (NI-2.1) related to poor appetite as evidenced by average kcal intake of  ~767 with a range 625-940.  Progress on physical inactivity (NB-2.1) related to fatigue as evidenced by exercise 5 X wk for the past month.  Disordered eating pattern (NB-1.5) related to recurrent food restriction as evidenced by self-reported continued weight anxiety and consequent restricted intake.   Intervention:  See Pt Instructions.    Monitoring/Eval: Dietary intake, body weight, and exercise at 3-wk F/U.      Complete Medication List: 1)  Clonazepam 1 Mg Tabs (Clonazepam) .... Am, noon, and dinner time. 2)  Geodon 40 Mg Caps (Ziprasidone hcl) .... 40 mg morning and dinnertime. 3)  Geodon 80 Mg Caps (Ziprasidone hcl) .... 80 mg at bedtime. 4)  Wellbutrin Xl 150 Mg Xr24h-tab (Bupropion hcl) .... Take 1 pill in the morning along with  1 300-mg wellbutrin for total of 450 mg. 5)  Seroquel Xr 200 Mg Xr24h-tab (Quetiapine fumarate) .... 200 mg (1 pill) at 3-4 pm. 6)  Alprazolam 0.5 Mg Tabs (Alprazolam) .... 0.5 mg (1 pill) as needed. 7)  Meloxicam 7.5 Mg Tabs (Meloxicam) .... 7.5 mg (1 pill) in the morning. 8)  Zyrtec Allergy 10 Mg Tabs (Cetirizine hcl) .Marland Kitchen.. 10 mg (1 pill) in the morning. 9)  Carafate 1 Gm Tabs (Sucralfate) .Marland Kitchen.. 1 g (1 pill) morning and bedtime. 10)  Cvs Daily Multiple Women 50+ Tabs (Multiple vitamins-minerals) .Marland Kitchen.. 1 multivitamin-mineral daily. 11)  D3-50 50000 Unit Caps (Cholecalciferol) .... (actually d2) 50,000 international units every other week. 12)  Clonazepam Odt 2 Mg Tbdp (Clonazepam) .... 2 pills at bedtime. 13)  Cymbalta 30 Mg Cpep (Duloxetine hcl) .... Take 1 pill in am with 2 x 60-mg cymbalta for total of 150 mg. 14)  Cymbalta 60 Mg Cpep (Duloxetine hcl) .... Take 2 pills am along with 1 30-mg cymbalta for total of 150 mg. 15)  Wellbutrin Xl 300 Mg Xr24h-tab (Bupropion hcl) .... Take 1 pill in the morning along with 150-mg wellbutrin for total of 450 mg. 16)  Trazodone Hcl 150 Mg Tabs (Trazodone hcl) .... Take 1/2 pill at bedtime.  [id number]  Patient Instructions: 1)  Exercise:  Limit to indoor activity till allergies subside.  Goal is still walking 5 X wk.   2)  Much better on consisitency; keep it up! 3)  Please have Dr. Hayden Rasmussen office fax your lab results:  [phone].   4)  Continue to email exercise weekly.   5)  Please schedule an appt for Sept 20 at 10:30 ON THE NURSE SCHEDULE.
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
## 2010-03-15 NOTE — Op Note (Signed)
Summary: Consent  Consent   Imported By: Marily Memos 03/05/2010 15:04:35  _____________________________________________________________________  External Attachment:    Type:   Image     Comment:   External Document

## 2010-03-15 NOTE — Assessment & Plan Note (Signed)
Summary: SEE DR Brailee Riede/[facility]   Vital Signs:  Patient profile:   55 year old female Height:      60.5 inches Weight:      146.0 pounds BMI:     28.15  Vitals Entered By: Wyona Almas PHD (February 27, 2009 10:35 AM)  History of Present Illness: Assessment:  Pt seen for 30 min.  Discussed at length the importance of consistency in meal times, composition, and quantities, as well as adequate carb and energy intake.  Most recent food record shows an average kcal intake of 685, but again with a wide range, [phone].  Becky [name] exercised 10 of the past 13 day, getting at least 30 min walking, but she said she really does feel exhausted all the time, and I told her it's no wonder in light of her inadequate dietary intake.  She did do better on protein the past two weeks, with an average of 48 grams/day, but intake was erratic, ranging from 25 to 70 grams/day.   Nutrition Diagnosis:  Erratic progress on inadequate oral food intake (NI-2.1) related to poor appetite as evidenced by average kcal intake of  ~685, but range of [phone].  Progress on physical inactivity (NB-2.1) related to fatigue as evidenced by exercise 10 X in the past 13 days.  Disordered eating pattern (NB-1.5) related to recurrent food restriction as evidenced by self-reported continued weight anxiety and consequent restricted intake.   Intervention:  See pt instructions.   Monitoring/Eval:   Monitor weight, exercise, and food and water intake at F/U in 2 wk.      Complete Medication List: 1)  Clonazepam 1 Mg Tabs (Clonazepam) .... Am, noon, and dinner time. 2)  Geodon 40 Mg Caps (Ziprasidone hcl) .... 40 mg morning and dinnertime. 3)  Geodon 80 Mg Caps (Ziprasidone hcl) .... 80 mg at bedtime. 4)  Wellbutrin Xl 150 Mg Xr24h-tab (Bupropion hcl) .... Take 1 pill in the morning along with 1 300-mg wellbutrin for total of 450 mg. 5)  Seroquel Xr 200 Mg Xr24h-tab (Quetiapine fumarate) .... 200 mg (1 pill) at 3-4 pm. 6)  Alprazolam 0.5 Mg  Tabs (Alprazolam) .... 0.5 mg (1 pill) as needed. 7)  Meloxicam 7.5 Mg Tabs (Meloxicam) .... 7.5 mg (1 pill) in the morning. 8)  Zyrtec Allergy 10 Mg Tabs (Cetirizine hcl) .Marland Kitchen.. 10 mg (1 pill) in the morning. 9)  Carafate 1 Gm Tabs (Sucralfate) .Marland Kitchen.. 1 g (1 pill) morning and bedtime. 10)  Cvs Daily Multiple Women 50+ Tabs (Multiple vitamins-minerals) .Marland Kitchen.. 1 multivitamin-mineral daily. 11)  D3-50 50000 Unit Caps (Cholecalciferol) .... (actually d2) 50,000 international units every other week. 12)  Clonazepam Odt 2 Mg Tbdp (Clonazepam) .... 2 pills at bedtime. 13)  Cymbalta 30 Mg Cpep (Duloxetine hcl) .... Take 1 pill in am with 2 x 60-mg cymbalta for total of 150 mg. 14)  Cymbalta 60 Mg Cpep (Duloxetine hcl) .... Take 2 pills am along with 1 30-mg cymbalta for total of 150 mg. 15)  Wellbutrin Xl 300 Mg Xr24h-tab (Bupropion hcl) .... Take 1 pill in the morning along with 150-mg wellbutrin for total of 450 mg. 16)  Trazodone Hcl 150 Mg Tabs (Trazodone hcl) .... Take 1/2 pill at bedtime.  [phone]  Patient Instructions: 1)  Principles of weight management (which we have observed before to work for you): (a) regular exercise; (b) consistency in meal times and quantities; (c) appropriate diet composition, i.e., lots of fruits and veg's, minimal fat,  adequate protein, and carbohydrate to meet energy needs.   2)  Each lunch and dinner needs to include veg's, protein, and some starch.   3)  Plan meals in advance.  (Plan left-overs.) 4)  Exercise recommendations as before:  At least 30 min at least 5 X wk plus tennis.  (Also, let fatigue/energy levels be your guide.) 5)  Reminder:  Goal of at least 55 g protein per day!

## 2010-03-15 NOTE — Assessment & Plan Note (Signed)
Summary: 10:30 appt to see sykes/eo   Vital Signs:  Patient profile:   55 year old female Height:      60.5 inches Weight:      165.4 pounds BMI:     31.89  Vitals Entered By: Wyona Almas PHD (November 20, 2009 10:25 AM)  History of Present Illness: Assessment:  Spent 30 minutes with pt.  Tharon has started having some daily lower back (S5-S1) spasms, which is where she has a bulging disk.  This started  ~3 weeks ago, so of course, her exercise has been limited (6 times in 3 weeks).  She has played tennis 2 X, but she said between her knee and her back, she can't do much of anything for the rest of the day following tennis.  Knee pain is most severe when going down-hill or down stairs.  Tiena's food records indicate an average protein intake of 54 g/day, and she was much more consistent in both eating times (3 meals + 1-2 snacks/day) and in veg intake (veg's presenta t every L and D).  She remains very concerned about her weight, asking why she did not lose weight last appt when she had been exercising and making good food choices.  She is stilll unaware of her actual weight; when asked about weight loss, I told her last week only that she had not lost.  Weight continues to increase, especially now that exercise is less.    Nutrition Diagnosis:  Regression on inadequate oral food intake (NI-2.1) related to poor appetite as evidenced by average kcal intake of  ~665 (range 515-920),  ~100 kcal/day less than at last appt.   NOTE:  DIET COMPOSITION HAS IMPROVED, HOWEVER, with good consistency in kcal distribution, protein sources, and veg intake.  Regression on physical inactivity (NB-2.1) related to pain as evidenced by exercise 6 X during the past 21 days.  Disordered eating pattern (NB-1.5) related to recurrent food restriction as evidenced by self-reported continued weight anxiety.   Intervention:  See Pt Instructions.    Monitoring/Eval: Dietary intake, body weight, and exercise at 3-wk  F/U.     Allergies: 1)  ! * Ms Contin, Percocette 2)  ! * Methadone 3)  ! Macrodantin 4)  Penicillin 5)  Aspirin 6)  Biaxin   Complete Medication List: 1)  Clonazepam 1 Mg Tabs (Clonazepam) .... Am, noon, and dinner time. 2)  Geodon 40 Mg Caps (Ziprasidone hcl) .... 40 mg morning and dinnertime. 3)  Geodon 80 Mg Caps (Ziprasidone hcl) .... 80 mg at bedtime. 4)  Wellbutrin Xl 150 Mg Xr24h-tab (Bupropion hcl) .... Take 1 pill in the morning along with 1 300-mg wellbutrin for total of 450 mg. 5)  Seroquel Xr 200 Mg Xr24h-tab (Quetiapine fumarate) .... 200 mg (1 pill) at 3-4 pm. 6)  Alprazolam 0.5 Mg Tabs (Alprazolam) .... 0.5 mg (1 pill) as needed. 7)  Meloxicam 7.5 Mg Tabs (Meloxicam) .... 7.5 mg (1 pill) in the morning. 8)  Zyrtec Allergy 10 Mg Tabs (Cetirizine hcl) .Marland Kitchen.. 10 mg (1 pill) in the morning. 9)  Carafate 1 Gm Tabs (Sucralfate) .Marland Kitchen.. 1 g (1 pill) morning and bedtime. 10)  Cvs Daily Multiple Women 50+ Tabs (Multiple vitamins-minerals) .Marland Kitchen.. 1 multivitamin-mineral daily. 11)  D3-50 50000 Unit Caps (Cholecalciferol) .... (actually d2) 50,000 international units every other week. 12)  Clonazepam Odt 2 Mg Tbdp (Clonazepam) .... 2 pills at bedtime. 13)  Cymbalta 30 Mg Cpep (Duloxetine hcl) .... Take 1 pill in am with 2 x  60-mg cymbalta for total of 150 mg. 14)  Cymbalta 60 Mg Cpep (Duloxetine hcl) .... Take 2 pills am along with 1 30-mg cymbalta for total of 150 mg. 15)  Wellbutrin Xl 300 Mg Xr24h-tab (Bupropion hcl) .... Take 1 pill in the morning along with 150-mg wellbutrin for total of 450 mg. 16)  Trazodone Hcl 150 Mg Tabs (Trazodone hcl) .... Take 1/2 pill at bedtime. 17)  Tramadol Hcl 50 Mg Tabs (Tramadol hcl) .Marland Kitchen.. 1-2 by mouth two times a day as needed knee pain  Other Orders: Reassessment Each 15 min unit- [facility] (16109)  Patient Instructions: 1)  Please make an appt with Dr. Jennette Kettle to have her evaluate your right knee and your back pain.  2)  Food goals remain the same:  55 g  protein/day, consistent meal times and 1-2 snacks/day, and veg's at both lunch and dinner.  3)  Congrat's on making good food choices, meeting above goals.   4)  Follow-up appt in 3 weeks.

## 2010-03-15 NOTE — Progress Notes (Signed)
  Phone Note Call from Patient   Caller: Patient Summary of Call: Pt requested referral to PT for rt knee pain- states she has been unable to exercise due to the pain, which is inhibiting her weight loss.  States she is going for eval of a hernia that she may need surgery for and she would like to lose weight before the surgery.  Per Dr. Jennette Kettle ok to refer her to PT- patient requests to go to [facility].   Initial call taken by: Rochele Pages RN,  January 29, 2010 4:40 PM  Follow-up for Phone Call        Order faxed to [facility] on [address]  They will call pt to set up appt.  Follow-up by: Rochele Pages RN,  January 30, 2010 12:13 PM

## 2010-03-15 NOTE — Assessment & Plan Note (Signed)
Summary: PT TO SEE Danie Chandler  Nurse Visit   Vital Signs:  Patient profile:   55 year old female Height:      60.5 inches Weight:      169.8 pounds BMI:     32.73  Vitals Entered By: Wyona Almas PHD (February 19, 2010 10:25 AM)  History of Present Illness: Assessment:  Spent 30 min w/ pt.   Becky [name]'s weight is unchanged, and she is increasingly distressed that she is unable to lose weight.  She has been struggling with fatigue and depression, which has made it harder for her to make good food choices.  She continues to have knee pain as well, and pain in upper R abdominal quadrant, for which CT scan ruled out hernia last week.  She has an appt with surgeon Dr. Daphine Deutscher for further w/up on Jan 20.  She has gone to two rehab appts now, and hopes she will be able to get a cortisone injection for her R knee when she sees Dr. Jennette Kettle next.    Nutrition Diagnosis: No progress on inadequate oral food intake (NI-2.1) related to poor appetite as evidenced by several meals skipped in previous 21 days.  No progress on physical inactivity (NB-2.1) related to pain as evidenced by exercise 5 X during the past 22 days.  Disordered eating pattern (NB-1.5) related to recurrent food restriction as evidenced by self-reported continued weight anxiety.   Intervention:  See Pt Instructions.    Monitoring/Eval: Dietary intake, body weight, and exercise at 3-wk F/U.      Patient Instructions: 1)  Ask at rehab re. doing water exercise.  2)  If you have the approval of rehab, aim for at least once a week water exercise.   3)  Goals:  55 g protein, veg's twice a day, and eat at least 3 X day.  4)  Next appt:  Jan 30 @ 10:30.     Allergies: 1)  ! * Ms Contin, Percocette 2)  ! * Methadone 3)  ! Macrodantin 4)  Penicillin 5)  Aspirin 6)  Biaxin  Orders Added: 1)  Reassessment Each 15 min unit- [facility] [16109]

## 2010-03-15 NOTE — Assessment & Plan Note (Signed)
Summary: F/U per Gerilyn Pilgrim / [facility]   Vital Signs:  Patient profile:   55 year old female Height:      60.5 inches Weight:      152.2 pounds BMI:     29.34  Vitals Entered By: Wyona Almas PHD (June 05, 2009 10:57 AM)  History of Present Illness: Assessment: Spent 30 minutes with pt.  Food records indicated that [place] ate with a little more consistency in the past 2 weeks; although her kcal range was again [phone], there were only 3 days of intake <600 kcal, and she had more meals that were balanced with veg's and protein.  Average protein intake was 46 grams/day (23-79).  Nutrition Diagnosis:  Some progress on inadequate oral food intake (NI-2.1) related to poor appetite and fod anxiety as evidenced by fewer severely low intake days with average of 829 kcal/day.  Progress noted on physical inactivity (NB-2.1) related to fatigue as evidenced by exercise 5 X wk each of the past 2 wks.  Disordered eating pattern (NB-1.5) related to recurrent food restriction as evidenced by self-reported continued weight anxiety and consequent restricted intake.   Intervention:  See Pt Instructions.    Monitoring/Eval: Dietary intake, body weight, and exercise at 2-wk F/U.     Complete Medication List: 1)  Clonazepam 1 Mg Tabs (Clonazepam) .... Am, noon, and dinner time. 2)  Geodon 40 Mg Caps (Ziprasidone hcl) .... 40 mg morning and dinnertime. 3)  Geodon 80 Mg Caps (Ziprasidone hcl) .... 80 mg at bedtime. 4)  Wellbutrin Xl 150 Mg Xr24h-tab (Bupropion hcl) .... Take 1 pill in the morning along with 1 300-mg wellbutrin for total of 450 mg. 5)  Seroquel Xr 200 Mg Xr24h-tab (Quetiapine fumarate) .... 200 mg (1 pill) at 3-4 pm. 6)  Alprazolam 0.5 Mg Tabs (Alprazolam) .... 0.5 mg (1 pill) as needed. 7)  Meloxicam 7.5 Mg Tabs (Meloxicam) .... 7.5 mg (1 pill) in the morning. 8)  Zyrtec Allergy 10 Mg Tabs (Cetirizine hcl) .Marland Kitchen.. 10 mg (1 pill) in the morning. 9)  Carafate 1 Gm Tabs (Sucralfate) .Marland Kitchen.. 1 g (1 pill)  morning and bedtime. 10)  Cvs Daily Multiple Women 50+ Tabs (Multiple vitamins-minerals) .Marland Kitchen.. 1 multivitamin-mineral daily. 11)  D3-50 50000 Unit Caps (Cholecalciferol) .... (actually d2) 50,000 international units every other week. 12)  Clonazepam Odt 2 Mg Tbdp (Clonazepam) .... 2 pills at bedtime. 13)  Cymbalta 30 Mg Cpep (Duloxetine hcl) .... Take 1 pill in am with 2 x 60-mg cymbalta for total of 150 mg. 14)  Cymbalta 60 Mg Cpep (Duloxetine hcl) .... Take 2 pills am along with 1 30-mg cymbalta for total of 150 mg. 15)  Wellbutrin Xl 300 Mg Xr24h-tab (Bupropion hcl) .... Take 1 pill in the morning along with 150-mg wellbutrin for total of 450 mg. 16)  Trazodone Hcl 150 Mg Tabs (Trazodone hcl) .... Take 1/2 pill at bedtime.  [phone]  Patient Instructions: 1)  Another suggestion for using beans:  mix a little olive oil with vinegar or lemon, garlic, and any other herbs, and pour over beans.  Add LOTS of cilantro or parsley.  Good hot or cold, on the side or on salad, or mixed with rice.   2)  OR Gazspacho soup: Ask Santina Evans for her recipe.   3)  You're doing great on both diet and exercise right now, so keep that up:  Goals are 30 minutes 5 X wk; 55 g protein/day; veg's 2 X  day; eat at least 3 meals and 1-2 snacks.  4)  Follow-up appt: 2 wk.

## 2010-03-15 NOTE — Assessment & Plan Note (Signed)
Summary: STILL HAVE A LOT OF PAIN/MJD   Vital Signs:  Patient profile:   55 year old female Pulse rate:   130 / minute BP sitting:   140 / 64  (right arm)  Vitals Entered By: Rochele Pages RN (March 05, 2010 1:47 PM) CC: lt knee pain   CC:  lt knee pain.  History of Present Illness: returned for left knee pain. had right knee inj last week and had significant improvement--so much that she realized how bad the left one was hurting. no weaknes or falls, no fever. pain 7/10 at worst--better w rest. weather seems to make it bad as weell.  Habits & Providers  Alcohol-Tobacco-Diet     Tobacco Status: quit  Allergies: 1)  ! * Ms Contin, Percocette 2)  ! * Methadone 3)  ! Macrodantin 4)  Penicillin 5)  Aspirin 6)  Biaxin  Review of Systems  The patient denies fever.    Physical Exam  Additional Exam:  Left knee TTP medial joint line. no joit lione fullness. Ligamentously intact  Patient given informed consent for injection. Discussed possible complications of infection, bleeding or skin atrophy at site of injection. Possible side effect of avascular necrosis (focal area of bone death) due to steroid use.Appropriate verbal time out taken Are cleaned and prepped in usual sterile fashion. A --1-- cc kennalog plus ---4-cc 1% lidocaine without epinephrine was injected into the---left knee. Patient tolerated procedure well with no complications.    Impression & Recommendations:  Problem # 1:  DEGENERATIVE JOINT DISEASE, KNEES, BILATERAL (ICD-715.96)  Orders: Joint Aspirate / Injection, Large (16109) Kenalog 10 mg inj (J3301)  Complete Medication List: 1)  Clonazepam 1 Mg Tabs (Clonazepam) .... Am, noon, and dinner time. 2)  Geodon 40 Mg Caps (Ziprasidone hcl) .... 40 mg morning and dinnertime. 3)  Geodon 80 Mg Caps (Ziprasidone hcl) .... 80 mg at bedtime. 4)  Wellbutrin Xl 150 Mg Xr24h-tab (Bupropion hcl) .... Take 1 pill in the morning along with 1 300-mg wellbutrin for total  of 450 mg. 5)  Seroquel Xr 200 Mg Xr24h-tab (Quetiapine fumarate) .... 200 mg (1 pill) at 3-4 pm. 6)  Alprazolam 0.5 Mg Tabs (Alprazolam) .... 0.5 mg (1 pill) as needed. 7)  Meloxicam 7.5 Mg Tabs (Meloxicam) .... 7.5 mg (1 pill) in the morning. 8)  Zyrtec Allergy 10 Mg Tabs (Cetirizine hcl) .Marland Kitchen.. 10 mg (1 pill) in the morning. 9)  Carafate 1 Gm Tabs (Sucralfate) .Marland Kitchen.. 1 g (1 pill) morning and bedtime. 10)  Cvs Daily Multiple Women 50+ Tabs (Multiple vitamins-minerals) .Marland Kitchen.. 1 multivitamin-mineral daily. 11)  D3-50 50000 Unit Caps (Cholecalciferol) .... (actually d2) 50,000 international units every other week. 12)  Clonazepam Odt 2 Mg Tbdp (Clonazepam) .... 2 pills at bedtime. 13)  Cymbalta 30 Mg Cpep (Duloxetine hcl) .... Take 1 pill in am with 2 x 60-mg cymbalta for total of 150 mg. 14)  Cymbalta 60 Mg Cpep (Duloxetine hcl) .... Take 2 pills am along with 1 30-mg cymbalta for total of 150 mg. 15)  Wellbutrin Xl 300 Mg Xr24h-tab (Bupropion hcl) .... Take 1 pill in the morning along with 150-mg wellbutrin for total of 450 mg. 16)  Trazodone Hcl 150 Mg Tabs (Trazodone hcl) .... Take 1/2 pill at bedtime. 17)  Tramadol Hcl 50 Mg Tabs (Tramadol hcl) .Marland Kitchen.. 1-2 by mouth two times a day as needed knee pain   Orders Added: 1)  Est. Patient Level III [60454] 2)  Joint Aspirate / Injection, Large [20610] 3)  Kenalog 10 mg inj [J3301]

## 2010-03-15 NOTE — Assessment & Plan Note (Signed)
Summary: F/U per Gerilyn Pilgrim / [facility]   Vital Signs:  Patient profile:   55 year old female Height:      60.5 inches Weight:      156.8 pounds BMI:     30.23  Vitals Entered By: Wyona Almas PHD (May 22, 2009 11:26 AM)  History of Present Illness: Assessment:  Spent 30 minutes with pt.  Ellee has continued to struggle with depression and lack of motivation for both eating and exercise.  She has played tennis once a week, at least.  We discussed her last appt's food record, which showed an intake of [phone] kcal per day, and how this inconsistency contributes to difficulty with wt mgmt.  Although she is unaware of her actual weight, she knows she has been gaining, and is unhappy about it.  Her 21-day food record suggests an average intake of 705 kcal (range [phone]).  Protein intake averaged  ~44 g/day (range 21-79).    Nutrition Diagnosis: No progress on inadequate oral food intake (NI-2.1) related to poor appetite as evidenced by erratic kcal intake of (average of 900, but range of [phone]).  Little progress on physical inactivity (NB-2.1) related to fatigue as evidenced by exercise (tennis) 1 X in the past 14 days.  Disordered eating pattern (NB-1.5) related to recurrent food restriction as evidenced by self-reported continued weight anxiety and consequent restricted intake.   Intervention:  See Pt Instructions.    Monitoring/Eval: Dietary intake, body weight, and exercise at 3-wk F/U.     Complete Medication List: 1)  Clonazepam 1 Mg Tabs (Clonazepam) .... Am, noon, and dinner time. 2)  Geodon 40 Mg Caps (Ziprasidone hcl) .... 40 mg morning and dinnertime. 3)  Geodon 80 Mg Caps (Ziprasidone hcl) .... 80 mg at bedtime. 4)  Wellbutrin Xl 150 Mg Xr24h-tab (Bupropion hcl) .... Take 1 pill in the morning along with 1 300-mg wellbutrin for total of 450 mg. 5)  Seroquel Xr 200 Mg Xr24h-tab (Quetiapine fumarate) .... 200 mg (1 pill) at 3-4 pm. 6)  Alprazolam 0.5 Mg Tabs (Alprazolam) .... 0.5  mg (1 pill) as needed. 7)  Meloxicam 7.5 Mg Tabs (Meloxicam) .... 7.5 mg (1 pill) in the morning. 8)  Zyrtec Allergy 10 Mg Tabs (Cetirizine hcl) .Marland Kitchen.. 10 mg (1 pill) in the morning. 9)  Carafate 1 Gm Tabs (Sucralfate) .Marland Kitchen.. 1 g (1 pill) morning and bedtime. 10)  Cvs Daily Multiple Women 50+ Tabs (Multiple vitamins-minerals) .Marland Kitchen.. 1 multivitamin-mineral daily. 11)  D3-50 50000 Unit Caps (Cholecalciferol) .... (actually d2) 50,000 international units every other week. 12)  Clonazepam Odt 2 Mg Tbdp (Clonazepam) .... 2 pills at bedtime. 13)  Cymbalta 30 Mg Cpep (Duloxetine hcl) .... Take 1 pill in am with 2 x 60-mg cymbalta for total of 150 mg. 14)  Cymbalta 60 Mg Cpep (Duloxetine hcl) .... Take 2 pills am along with 1 30-mg cymbalta for total of 150 mg. 15)  Wellbutrin Xl 300 Mg Xr24h-tab (Bupropion hcl) .... Take 1 pill in the morning along with 150-mg wellbutrin for total of 450 mg. 16)  Trazodone Hcl 150 Mg Tabs (Trazodone hcl) .... Take 1/2 pill at bedtime.  Other Orders: Reassessment Each 15 min unit- [facility] [phone])  Patient Instructions: 1)  Ask neighbor Britta Mccreedy to walk with you a few times a week.   2)  On days you aren't walking with Britta Mccreedy, walk in the morning.   3)  Eat at least 3 meals and 1-2 snacks per day. CONSISTENCY IN YOUR INTAKE DAY-TO-DAY.  4)  Vegetables with both lunch and dinner!!!!!!!!! 5)  55 grams of protein per day.

## 2010-03-15 NOTE — Letter (Signed)
Summary: ROI  ROI   Imported By: Marily Memos 12/11/2009 13:41:19  _____________________________________________________________________  External Attachment:    Type:   Image     Comment:   External Document

## 2010-03-15 NOTE — Assessment & Plan Note (Signed)
Summary: Seeing Sykes @ 1:30 / [facility]   Vital Signs:  Patient profile:   55 year old female Height:      60.5 inches Weight:      160.4 pounds BMI:     30.92  Vitals Entered By: Wyona Almas PHD (October 12, 2009 1:39 PM)  History of Present Illness: Assessment:  Spent 30 min with pt.  Guida has been walking and playing tennis regularly.  She has been making better food choices in that she has been getting an average of 51 g of protein daily, and has been getting veg's at most lunch and dinner meals.  Average intake on 9 of previous 24 days was 800 kcal, an increase of  ~30 kcal/day from last appt.  Jamisha's weight is actually down almost 3 lb, however, likely a result of more consistent exercise as well as better food choices.  Windsor says the exercise makes her very tired, but she admits that she is not more tired than when not exercising; it's just a different kind of tired.    Nutrition Diagnosis:  Progress on inadequate oral food intake (NI-2.1) related to poor appetite as evidenced by average kcal intake of  ~800 with a range 540-940.  Progress on physical inactivity (NB-2.1) related to fatigue as evidenced by exercise at least 5 X wk for the past month.  Disordered eating pattern (NB-1.5) related to recurrent food restriction as evidenced by self-reported continued weight anxiety and consequent restricted intake.   Intervention:  See Pt Instructions.    Monitoring/Eval: Dietary intake, body weight, and exercise at 3-wk F/U.      Complete Medication List: 1)  Clonazepam 1 Mg Tabs (Clonazepam) .... Am, noon, and dinner time. 2)  Geodon 40 Mg Caps (Ziprasidone hcl) .... 40 mg morning and dinnertime. 3)  Geodon 80 Mg Caps (Ziprasidone hcl) .... 80 mg at bedtime. 4)  Wellbutrin Xl 150 Mg Xr24h-tab (Bupropion hcl) .... Take 1 pill in the morning along with 1 300-mg wellbutrin for total of 450 mg. 5)  Seroquel Xr 200 Mg Xr24h-tab (Quetiapine fumarate) .... 200 mg (1 pill) at 3-4  pm. 6)  Alprazolam 0.5 Mg Tabs (Alprazolam) .... 0.5 mg (1 pill) as needed. 7)  Meloxicam 7.5 Mg Tabs (Meloxicam) .... 7.5 mg (1 pill) in the morning. 8)  Zyrtec Allergy 10 Mg Tabs (Cetirizine hcl) .Marland Kitchen.. 10 mg (1 pill) in the morning. 9)  Carafate 1 Gm Tabs (Sucralfate) .Marland Kitchen.. 1 g (1 pill) morning and bedtime. 10)  Cvs Daily Multiple Women 50+ Tabs (Multiple vitamins-minerals) .Marland Kitchen.. 1 multivitamin-mineral daily. 11)  D3-50 50000 Unit Caps (Cholecalciferol) .... (actually d2) 50,000 international units every other week. 12)  Clonazepam Odt 2 Mg Tbdp (Clonazepam) .... 2 pills at bedtime. 13)  Cymbalta 30 Mg Cpep (Duloxetine hcl) .... Take 1 pill in am with 2 x 60-mg cymbalta for total of 150 mg. 14)  Cymbalta 60 Mg Cpep (Duloxetine hcl) .... Take 2 pills am along with 1 30-mg cymbalta for total of 150 mg. 15)  Wellbutrin Xl 300 Mg Xr24h-tab (Bupropion hcl) .... Take 1 pill in the morning along with 150-mg wellbutrin for total of 450 mg. 16)  Trazodone Hcl 150 Mg Tabs (Trazodone hcl) .... Take 1/2 pill at bedtime.  [facility]: [phone]  Patient Instructions: 1)  Give some thought to an abbreviated food record to include: 2)  Meals/snacks consumed, g protein, veg servings.   3)  Emphasis on 55 g protein  daily, veg's 2 X day, and consistency in your food quantities and diet composition.   4)  Appt in one month.

## 2010-03-15 NOTE — Assessment & Plan Note (Signed)
Summary: 10:30 appt with Kourtney Terriquez/eo   Vital Signs:  Patient profile:   55 year old female Height:      60.5 inches Weight:      163.4 pounds BMI:     31.50  Vitals Entered By: Wyona Almas PHD (October 31, 2009 10:31 AM)  History of Present Illness: Assessment:  Saw pt for 30 minutes.  Becky [name] has been walking  an hour 4 X wk plus tennis 2 hr/wk.  Knee pain is worse, but hasn't started using brace or tramadol yet, which she will start today.  She cannot check exHR b/c she can never find her pulse, but she said she si always breathing hard, so feels like she is working adequately.  Food records indicate she has been quite consistent in getting 3 meals and at least one snack/day.  Protein intake has averaged  ~50 g/day.  Out of 21 days, there were only 3 lunches/dinners at which Becky [name] did not get at least one serving of veg's.  In the past 11 days, she averaged 777 kcal.    Nutrition Diagnosis:  Stable progress on inadequate oral food intake (NI-2.1) related to poor appetite as evidenced by average kcal intake of  ~777 with a range 660-980.   Stable progress on physical inactivity (NB-2.1) related to fatigue as evidenced by exercise at least 5 X wk for the past month.  Disordered eating pattern (NB-1.5) related to recurrent food restriction as evidenced by self-reported continued weight anxiety despite slightly improved food intake over previous several months.   Intervention:  See Pt Instructions.    Monitoring/Eval: Dietary intake, body weight, and exercise at 3-wk F/U.      Complete Medication List: 1)  Clonazepam 1 Mg Tabs (Clonazepam) .... Am, noon, and dinner time. 2)  Geodon 40 Mg Caps (Ziprasidone hcl) .... 40 mg morning and dinnertime. 3)  Geodon 80 Mg Caps (Ziprasidone hcl) .... 80 mg at bedtime. 4)  Wellbutrin Xl 150 Mg Xr24h-tab (Bupropion hcl) .... Take 1 pill in the morning along with 1 300-mg wellbutrin for total of 450 mg. 5)  Seroquel Xr 200 Mg Xr24h-tab (Quetiapine  fumarate) .... 200 mg (1 pill) at 3-4 pm. 6)  Alprazolam 0.5 Mg Tabs (Alprazolam) .... 0.5 mg (1 pill) as needed. 7)  Meloxicam 7.5 Mg Tabs (Meloxicam) .... 7.5 mg (1 pill) in the morning. 8)  Zyrtec Allergy 10 Mg Tabs (Cetirizine hcl) .Marland Kitchen.. 10 mg (1 pill) in the morning. 9)  Carafate 1 Gm Tabs (Sucralfate) .Marland Kitchen.. 1 g (1 pill) morning and bedtime. 10)  Cvs Daily Multiple Women 50+ Tabs (Multiple vitamins-minerals) .Marland Kitchen.. 1 multivitamin-mineral daily. 11)  D3-50 50000 Unit Caps (Cholecalciferol) .... (actually d2) 50,000 international units every other week. 12)  Clonazepam Odt 2 Mg Tbdp (Clonazepam) .... 2 pills at bedtime. 13)  Cymbalta 30 Mg Cpep (Duloxetine hcl) .... Take 1 pill in am with 2 x 60-mg cymbalta for total of 150 mg. 14)  Cymbalta 60 Mg Cpep (Duloxetine hcl) .... Take 2 pills am along with 1 30-mg cymbalta for total of 150 mg. 15)  Wellbutrin Xl 300 Mg Xr24h-tab (Bupropion hcl) .... Take 1 pill in the morning along with 150-mg wellbutrin for total of 450 mg. 16)  Trazodone Hcl 150 Mg Tabs (Trazodone hcl) .... Take 1/2 pill at bedtime. 17)  Tramadol Hcl 50 Mg Tabs (Tramadol hcl) .Marland Kitchen.. 1-2 by mouth two times a day as needed knee pain  Other Orders: Reassessment Each 15 min unit- [facility] (69629)  Patient Instructions:  1)  I encourage you to try to get back to some water exercise for your knees, but whatever you do, keep up the 5(+) hrs/week (and use your knee brace). 2)  On days when you have lunch at your parents', microwave frozen veg's to go with your sandwich.  3)  Nutrition goals remain the same:  55 g protein/day; consistency in food distribution, and veg's 2 X day.   4)  Follow-up appt:  Oct 10 @ 10:30 (9:45 on nurse schedule).

## 2010-03-15 NOTE — Assessment & Plan Note (Signed)
Summary: f/u,mc   Vital Signs:  Patient profile:   55 year old female Pulse rate:   80 / minute BP sitting:   139 / 89  (right arm)  Vitals Entered By: Rochele Pages RN (December 11, 2009 1:36 PM) CC: f/u knee pain   CC:  f/u knee pain.  History of Present Illness: Right knee pain--the left knee injection helped about 90% so she would like to try one inher right knee. Had no problems with other injection.  Habits & Providers  Alcohol-Tobacco-Diet     Tobacco Status: quit  Current Medications (verified): 1)  Clonazepam 1 Mg Tabs (Clonazepam) .... Am, Noon, and Dinner Time. 2)  Geodon 40 Mg Caps (Ziprasidone Hcl) .... 40 Mg Morning and Dinnertime. 3)  Geodon 80 Mg Caps (Ziprasidone Hcl) .... 80 Mg At Bedtime. 4)  Wellbutrin Xl 150 Mg Xr24h-Tab (Bupropion Hcl) .... Take 1 Pill in The Morning Along With 1 300-Mg Wellbutrin For Total of 450 Mg. 5)  Seroquel Xr 200 Mg Xr24h-Tab (Quetiapine Fumarate) .... 200 Mg (1 Pill) At 3-4 Pm. 6)  Alprazolam 0.5 Mg Tabs (Alprazolam) .... 0.5 Mg (1 Pill) As Needed. 7)  Meloxicam 7.5 Mg Tabs (Meloxicam) .... 7.5 Mg (1 Pill) in The Morning. 8)  Zyrtec Allergy 10 Mg Tabs (Cetirizine Hcl) .Marland Kitchen.. 10 Mg (1 Pill) in The Morning. 9)  Carafate 1 Gm Tabs (Sucralfate) .Marland Kitchen.. 1 G (1 Pill) Morning and Bedtime. 10)  Cvs Daily Multiple Women 50+  Tabs (Multiple Vitamins-Minerals) .Marland Kitchen.. 1 Multivitamin-Mineral Daily. 11)  D3-50 50000 Unit Caps (Cholecalciferol) .... (Actually D2) 50,000 International Units Every Other Week. 12)  Clonazepam Odt 2 Mg Tbdp (Clonazepam) .... 2 Pills At Bedtime. 13)  Cymbalta 30 Mg Cpep (Duloxetine Hcl) .... Take 1 Pill in Am With 2 X 60-Mg Cymbalta For Total of 150 Mg. 14)  Cymbalta 60 Mg Cpep (Duloxetine Hcl) .... Take 2 Pills Am Along With 1 30-Mg Cymbalta For Total of 150 Mg. 15)  Wellbutrin Xl 300 Mg Xr24h-Tab (Bupropion Hcl) .... Take 1 Pill in The Morning Along With 150-Mg Wellbutrin For Total of 450 Mg. 16)  Trazodone Hcl 150 Mg Tabs  (Trazodone Hcl) .... Take 1/2 Pill At Bedtime. 17)  Tramadol Hcl 50 Mg Tabs (Tramadol Hcl) .Marland Kitchen.. 1-2 By Mouth Two Times A Day As Needed Knee Pain  Allergies: 1)  ! * Ms Contin, Percocette 2)  ! * Methadone 3)  ! Macrodantin 4)  Penicillin 5)  Aspirin 6)  Biaxin  Social History: Smoking Status:  quit  Review of Systems  The patient denies fever, weight loss, and weight gain.    Physical Exam  Msk:  Right knee TTP medial joint line. No effusion erythema or warmth. Full extesnion and flexion. distally neurovascularly intact Additional Exam:  Patient given informed consent for injection. Discussed possible complications of infection, bleeding or skin atrophy at site of injection. Possible side effect of avascular necrosis (focal area of bone death) due to steroid use.Appropriate verbal time out taken Are cleaned and prepped in usual sterile fashion. A ---1- cc kennalog plus ---4-cc 1% lidocaine without epinephrine was injected into the---. Patient tolerated procedure well with no complications.    Impression & Recommendations:  Problem # 1:  DEGENERATIVE JOINT DISEASE, KNEES, BILATERAL (ICD-715.96)  Her updated medication list for this problem includes:    Meloxicam 7.5 Mg Tabs (Meloxicam) .Marland Kitchen... 7.5 mg (1 pill) in the morning.    Tramadol Hcl 50 Mg Tabs (Tramadol hcl) .Marland Kitchen... 1-2 by mouth two  times a day as needed knee pain  Orders: Joint Aspirate / Injection, Large (20610) Kenalog 10 mg inj (J3301)  Complete Medication List: 1)  Clonazepam 1 Mg Tabs (Clonazepam) .... Am, noon, and dinner time. 2)  Geodon 40 Mg Caps (Ziprasidone hcl) .... 40 mg morning and dinnertime. 3)  Geodon 80 Mg Caps (Ziprasidone hcl) .... 80 mg at bedtime. 4)  Wellbutrin Xl 150 Mg Xr24h-tab (Bupropion hcl) .... Take 1 pill in the morning along with 1 300-mg wellbutrin for total of 450 mg. 5)  Seroquel Xr 200 Mg Xr24h-tab (Quetiapine fumarate) .... 200 mg (1 pill) at 3-4 pm. 6)  Alprazolam 0.5 Mg Tabs  (Alprazolam) .... 0.5 mg (1 pill) as needed. 7)  Meloxicam 7.5 Mg Tabs (Meloxicam) .... 7.5 mg (1 pill) in the morning. 8)  Zyrtec Allergy 10 Mg Tabs (Cetirizine hcl) .Marland Kitchen.. 10 mg (1 pill) in the morning. 9)  Carafate 1 Gm Tabs (Sucralfate) .Marland Kitchen.. 1 g (1 pill) morning and bedtime. 10)  Cvs Daily Multiple Women 50+ Tabs (Multiple vitamins-minerals) .Marland Kitchen.. 1 multivitamin-mineral daily. 11)  D3-50 50000 Unit Caps (Cholecalciferol) .... (actually d2) 50,000 international units every other week. 12)  Clonazepam Odt 2 Mg Tbdp (Clonazepam) .... 2 pills at bedtime. 13)  Cymbalta 30 Mg Cpep (Duloxetine hcl) .... Take 1 pill in am with 2 x 60-mg cymbalta for total of 150 mg. 14)  Cymbalta 60 Mg Cpep (Duloxetine hcl) .... Take 2 pills am along with 1 30-mg cymbalta for total of 150 mg. 15)  Wellbutrin Xl 300 Mg Xr24h-tab (Bupropion hcl) .... Take 1 pill in the morning along with 150-mg wellbutrin for total of 450 mg. 16)  Trazodone Hcl 150 Mg Tabs (Trazodone hcl) .... Take 1/2 pill at bedtime. 17)  Tramadol Hcl 50 Mg Tabs (Tramadol hcl) .Marland Kitchen.. 1-2 by mouth two times a day as needed knee pain   Orders Added: 1)  Est. Patient Level III [56213] 2)  Joint Aspirate / Injection, Large [20610] 3)  Kenalog 10 mg inj [J3301]  Appended Document: f/u,mc I injected the LEFT knee today--in exam and HPI I was discussing the left knee not the right. I injected the right knee last time

## 2010-03-15 NOTE — Assessment & Plan Note (Signed)
Summary: f/u with Dr. Youlanda Mighty   Vital Signs:  Patient profile:   55 year old female Height:      60.5 inches Weight:      144.7 pounds BMI:     27.90  Vitals Entered By: Wyona Almas PHD (March 13, 2009 10:23 AM)  History of Present Illness: Assessment: Spent 30 minutes with patient.  Becky [name] has been very depressed recently, which has translated into minimal food preparation.  Although she has done a much better job eating at least three X day, many meals consist of only one or two foods, i.e., black beans or beans and broccoli.  Becky [name] has not met her protein goal of 55 g on any day; averaged 39 g/day.  She has continued to walk, but said it is usually at a very slow pace; just hard to get motivated.  Food record suggested an average intake of 595 kcal (range 380 - 900).    Nutrition Diagnosis:  Some progress on inadequate oral food intake (NI-2.1) related to poor appetite as evidenced by average kcal intake of  ~685, but range of [phone].  Progress on physical inactivity (NB-2.1) related to fatigue as evidenced by exercise 10 X in the past 13 days.  Disordered eating pattern (NB-1.5) related to recurrent food restriction as evidenced by self-reported continued weight anxiety and consequent restricted intake.   Intervention:  See Pt Instructions.    Monitoring/Eval: Dietary intake, body weight, and exercise at 2-wk F/U.      Complete Medication List: 1)  Clonazepam 1 Mg Tabs (Clonazepam) .... Am, noon, and dinner time. 2)  Geodon 40 Mg Caps (Ziprasidone hcl) .... 40 mg morning and dinnertime. 3)  Geodon 80 Mg Caps (Ziprasidone hcl) .... 80 mg at bedtime. 4)  Wellbutrin Xl 150 Mg Xr24h-tab (Bupropion hcl) .... Take 1 pill in the morning along with 1 300-mg wellbutrin for total of 450 mg. 5)  Seroquel Xr 200 Mg Xr24h-tab (Quetiapine fumarate) .... 200 mg (1 pill) at 3-4 pm. 6)  Alprazolam 0.5 Mg Tabs (Alprazolam) .... 0.5 mg (1 pill) as needed. 7)  Meloxicam 7.5 Mg Tabs  (Meloxicam) .... 7.5 mg (1 pill) in the morning. 8)  Zyrtec Allergy 10 Mg Tabs (Cetirizine hcl) .Marland Kitchen.. 10 mg (1 pill) in the morning. 9)  Carafate 1 Gm Tabs (Sucralfate) .Marland Kitchen.. 1 g (1 pill) morning and bedtime. 10)  Cvs Daily Multiple Women 50+ Tabs (Multiple vitamins-minerals) .Marland Kitchen.. 1 multivitamin-mineral daily. 11)  D3-50 50000 Unit Caps (Cholecalciferol) .... (actually d2) 50,000 international units every other week. 12)  Clonazepam Odt 2 Mg Tbdp (Clonazepam) .... 2 pills at bedtime. 13)  Cymbalta 30 Mg Cpep (Duloxetine hcl) .... Take 1 pill in am with 2 x 60-mg cymbalta for total of 150 mg. 14)  Cymbalta 60 Mg Cpep (Duloxetine hcl) .... Take 2 pills am along with 1 30-mg cymbalta for total of 150 mg. 15)  Wellbutrin Xl 300 Mg Xr24h-tab (Bupropion hcl) .... Take 1 pill in the morning along with 150-mg wellbutrin for total of 450 mg. 16)  Trazodone Hcl 150 Mg Tabs (Trazodone hcl) .... Take 1/2 pill at bedtime.  Other Orders: Reassessment Each 15 min unit- [facility] [place] [phone])  Patient Instructions: 1)  Make a big batch of chlli or veg soup to eat through the week.   2)  (Veg soup:  2 cups for adequate protein; If chili, one cup = 1 serving, but also include a veg and/or fruit on the side.) 3)  Light box:  Check [employer],  [employer], and [email or web address]. 4)  Goals:  Exercise 30 min 5 X wk; at least 55 g protein/day; at least 3 meals a day at consistent times.  (Include times on your food record this time.)

## 2010-03-15 NOTE — Miscellaneous (Signed)
Summary: 25-OH Vitamin D   Clinical Lists Changes  Observations: Added new observation of VIT D 25-OH: 22 ng/mL (04/04/2009 13:44)

## 2010-03-15 NOTE — Assessment & Plan Note (Signed)
Summary: SEE DR Barney Russomanno/[facility]   Vital Signs:  Patient profile:   55 year old female Height:      60.5 inches Weight:      143.0 pounds BMI:     27.57  Vitals Entered By: Wyona Almas PHD (February 14, 2009 10:20 AM)  History of Present Illness: Assessment:  Pt seen for 30 min.  Becky [name] just can't seem to meet her protein recommendation.  Her estimated daily average intake was  ~35 g.  Her kcal intake was very erratic, ranging from  ~360 to 1180, averaging 714/day.  We again discussed the need for consistency in energy intake if she is to effectively manage her weight.  Becky [name] still insists that she does not experience hunger.  Becky [name] walked less than she'd hoped to due primarily to weather.  Tennis was not available during the two weeks over the holidays, but she hopes to start again this Friday.    Nutrition Diagnosis:  Errratic progress on inadequate oral food intake (NI-2.1) related to poor appetite as evidenced by average kcal intake of  ~714.  Regression on physical inactivity (NB-2.1) related to fatigue as evidenced by exercise  ~4 X wk in the past two weeks.  Disordered eating pattern (NB-1.5) related to recurrent food restriction as evidenced by self-reported continued weight anxiety and consequent restricted intake.   Intervention:  See pt instructions.   Monitoring/Eval:   Monitor weight, exercise, and food and water intake at F/U in 2 wk.      Complete Medication List: 1)  Clonazepam 1 Mg Tabs (Clonazepam) .... Am, noon, and dinner time. 2)  Geodon 40 Mg Caps (Ziprasidone hcl) .... 40 mg morning and dinnertime. 3)  Geodon 80 Mg Caps (Ziprasidone hcl) .... 80 mg at bedtime. 4)  Wellbutrin Xl 150 Mg Xr24h-tab (Bupropion hcl) .... Take 1 pill in the morning along with 1 300-mg wellbutrin for total of 450 mg. 5)  Seroquel Xr 200 Mg Xr24h-tab (Quetiapine fumarate) .... 200 mg (1 pill) at 3-4 pm. 6)  Alprazolam 0.5 Mg Tabs (Alprazolam) .... 0.5 mg (1 pill) as needed. 7)  Meloxicam  7.5 Mg Tabs (Meloxicam) .... 7.5 mg (1 pill) in the morning. 8)  Zyrtec Allergy 10 Mg Tabs (Cetirizine hcl) .Marland Kitchen.. 10 mg (1 pill) in the morning. 9)  Carafate 1 Gm Tabs (Sucralfate) .Marland Kitchen.. 1 g (1 pill) morning and bedtime. 10)  Cvs Daily Multiple Women 50+ Tabs (Multiple vitamins-minerals) .Marland Kitchen.. 1 multivitamin-mineral daily. 11)  D3-50 50000 Unit Caps (Cholecalciferol) .... (actually d2) 50,000 international units every other week. 12)  Clonazepam Odt 2 Mg Tbdp (Clonazepam) .... 2 pills at bedtime. 13)  Cymbalta 30 Mg Cpep (Duloxetine hcl) .... Take 1 pill in am with 2 x 60-mg cymbalta for total of 150 mg. 14)  Cymbalta 60 Mg Cpep (Duloxetine hcl) .... Take 2 pills am along with 1 30-mg cymbalta for total of 150 mg. 15)  Wellbutrin Xl 300 Mg Xr24h-tab (Bupropion hcl) .... Take 1 pill in the morning along with 150-mg wellbutrin for total of 450 mg. 16)  Trazodone Hcl 150 Mg Tabs (Trazodone hcl) .... Take 1/2 pill at bedtime.  [facility]  Patient Instructions: 1)  Protein at each meal!!  Aim for a total intake of at least 55 grams/day.   2)  Consistency in the amount of food you eat:  In addition to getting enough protein, be sure to NOT skip meals, and to eat plenty of vegetables with  both lunch and dinner.   3)  "Homework assignment": Find and try a recipe for bean burgers.  4)  Exercise goals:  at least 30 min walking at least 5 X wk, plus tennis as you are able.   5)  Follow-up:  2 wk.

## 2010-03-15 NOTE — Assessment & Plan Note (Signed)
Summary: BACK PAIN PER SYKES,MC   Vital Signs:  Patient profile:   55 year old female Height:      60.5 inches BP sitting:   120 / 85  Vitals Entered By: Rochele Pages RN (November 20, 2009 3:07 PM)  History of Present Illness: 1) B but R>L knee pain that is worse. now keeping her from doing a lot of activities. Not walking as much. Pain not awakening her from sleep. No new injury. Pain is deep ache, non radiating. better wit rest. 6/10  2)low back pain also worsening. has hx of HNP and has had MRI in past. Painis localized to w back, does not radiate into buttock or leg. no incontinence, no leg weakness. pain 5-7/10. Worse with standing or walking a long time. No new injury.  Current Medications (verified): 1)  Clonazepam 1 Mg Tabs (Clonazepam) .... Am, Noon, and Dinner Time. 2)  Geodon 40 Mg Caps (Ziprasidone Hcl) .... 40 Mg Morning and Dinnertime. 3)  Geodon 80 Mg Caps (Ziprasidone Hcl) .... 80 Mg At Bedtime. 4)  Wellbutrin Xl 150 Mg Xr24h-Tab (Bupropion Hcl) .... Take 1 Pill in The Morning Along With 1 300-Mg Wellbutrin For Total of 450 Mg. 5)  Seroquel Xr 200 Mg Xr24h-Tab (Quetiapine Fumarate) .... 200 Mg (1 Pill) At 3-4 Pm. 6)  Alprazolam 0.5 Mg Tabs (Alprazolam) .... 0.5 Mg (1 Pill) As Needed. 7)  Meloxicam 7.5 Mg Tabs (Meloxicam) .... 7.5 Mg (1 Pill) in The Morning. 8)  Zyrtec Allergy 10 Mg Tabs (Cetirizine Hcl) .Marland Kitchen.. 10 Mg (1 Pill) in The Morning. 9)  Carafate 1 Gm Tabs (Sucralfate) .Marland Kitchen.. 1 G (1 Pill) Morning and Bedtime. 10)  Cvs Daily Multiple Women 50+  Tabs (Multiple Vitamins-Minerals) .Marland Kitchen.. 1 Multivitamin-Mineral Daily. 11)  D3-50 50000 Unit Caps (Cholecalciferol) .... (Actually D2) 50,000 International Units Every Other Week. 12)  Clonazepam Odt 2 Mg Tbdp (Clonazepam) .... 2 Pills At Bedtime. 13)  Cymbalta 30 Mg Cpep (Duloxetine Hcl) .... Take 1 Pill in Am With 2 X 60-Mg Cymbalta For Total of 150 Mg. 14)  Cymbalta 60 Mg Cpep (Duloxetine Hcl) .... Take 2 Pills Am Along With 1  30-Mg Cymbalta For Total of 150 Mg. 15)  Wellbutrin Xl 300 Mg Xr24h-Tab (Bupropion Hcl) .... Take 1 Pill in The Morning Along With 150-Mg Wellbutrin For Total of 450 Mg. 16)  Trazodone Hcl 150 Mg Tabs (Trazodone Hcl) .... Take 1/2 Pill At Bedtime. 17)  Tramadol Hcl 50 Mg Tabs (Tramadol Hcl) .Marland Kitchen.. 1-2 By Mouth Two Times A Day As Needed Knee Pain  Allergies: 1)  ! * Ms Contin, Percocette 2)  ! * Methadone 3)  ! Macrodantin 4)  Penicillin 5)  Aspirin 6)  Biaxin  Review of Systems MS:  Complains of joint pain and low back pain; denies joint redness, joint swelling, loss of strength, mid back pain, muscle aches, cramps, stiffness, and thoracic pain.  Physical Exam  General:  alert, well-developed, well-nourished, and well-hydrated.   Msk:   B knees have some external changes of OA, TTP medial joint line B, right knee also TTP lateral joint line. No effusion. Full extension and fllexion. CALVES: soft NEURO: distal sensation to soft touch intact. BACK: vertebra nontender topalpation limbar and thoracic area. no deformity, no skin lesions, no erythema or warmth. Limted flexion at hips due tp very tight hamstrings. Very stiff in low back with lateral rotation , hyperextension but not painful.   SLR neg B in seat3ed and prone positions. LE strength 5/5  B symmetrical Additional Exam:  Patient given informed consent for injection. Discussed possible complications of infection, bleeding or skin atrophy at site of injection. Possible side effect of avascular necrosis (focal area of bone death) due to steroid use.Appropriate verbal time out taken Are cleaned and prepped in usual sterile fashion. A --1-- cc kennalog plus ---4-cc 1% lidocaine without epinephrine was injected into the---right knee using anterior approach. Patient tolerated procedure well with no complications.    Impression & Recommendations:  Problem # 1:  BACK PAIN, CHRONIC (ICD-724.5) Reviewed MRI L spine 2008:I do not think her HNP  is causing her back pain. It is more related to general deconditioning of low back musculature and her stiffness. Will start HEP--HO given and reviewed. rtc 4 w for f/u tried to reassure her re her hx of HNP>  Problem # 2:  DEGENERATIVE JOINT DISEASE, KNEES, BILATERAL (ICD-715.96)  reviewed her x rays. will try steroid injection.  Orders: Joint Aspirate / Injection, Large (20610) Kenalog 10 mg inj (J3301)  Complete Medication List: 1)  Clonazepam 1 Mg Tabs (Clonazepam) .... Am, noon, and dinner time. 2)  Geodon 40 Mg Caps (Ziprasidone hcl) .... 40 mg morning and dinnertime. 3)  Geodon 80 Mg Caps (Ziprasidone hcl) .... 80 mg at bedtime. 4)  Wellbutrin Xl 150 Mg Xr24h-tab (Bupropion hcl) .... Take 1 pill in the morning along with 1 300-mg wellbutrin for total of 450 mg. 5)  Seroquel Xr 200 Mg Xr24h-tab (Quetiapine fumarate) .... 200 mg (1 pill) at 3-4 pm. 6)  Alprazolam 0.5 Mg Tabs (Alprazolam) .... 0.5 mg (1 pill) as needed. 7)  Meloxicam 7.5 Mg Tabs (Meloxicam) .... 7.5 mg (1 pill) in the morning. 8)  Zyrtec Allergy 10 Mg Tabs (Cetirizine hcl) .Marland Kitchen.. 10 mg (1 pill) in the morning. 9)  Carafate 1 Gm Tabs (Sucralfate) .Marland Kitchen.. 1 g (1 pill) morning and bedtime. 10)  Cvs Daily Multiple Women 50+ Tabs (Multiple vitamins-minerals) .Marland Kitchen.. 1 multivitamin-mineral daily. 11)  D3-50 50000 Unit Caps (Cholecalciferol) .... (actually d2) 50,000 international units every other week. 12)  Clonazepam Odt 2 Mg Tbdp (Clonazepam) .... 2 pills at bedtime. 13)  Cymbalta 30 Mg Cpep (Duloxetine hcl) .... Take 1 pill in am with 2 x 60-mg cymbalta for total of 150 mg. 14)  Cymbalta 60 Mg Cpep (Duloxetine hcl) .... Take 2 pills am along with 1 30-mg cymbalta for total of 150 mg. 15)  Wellbutrin Xl 300 Mg Xr24h-tab (Bupropion hcl) .... Take 1 pill in the morning along with 150-mg wellbutrin for total of 450 mg. 16)  Trazodone Hcl 150 Mg Tabs (Trazodone hcl) .... Take 1/2 pill at bedtime. 17)  Tramadol Hcl 50 Mg Tabs  (Tramadol hcl) .Marland Kitchen.. 1-2 by mouth two times a day as needed knee pain

## 2010-03-15 NOTE — Miscellaneous (Signed)
Summary: Lab Results  Clinical Lists Changes  Observations: Added new observation of TSH: 3.67 microintl units/mL (09/19/2009 12:15) Added new observation of VLDL: 32 mg/dL (16/11/9602 54:09) Added new observation of LDL: 118 mg/dL (81/19/1478 29:56) Added new observation of HDL: 70 mg/dL (21/30/8657 84:69) Added new observation of TRIGLYC TOT: 159 mg/dL (62/95/2841 32:44) Added new observation of CHOLESTEROL: 220 mg/dL (02/13/7251 66:44) Added new observation of VIT D 25-OH: 29.3 ng/mL (09/13/2009 12:15) Added new observation of NA: 139 meq/L (09/13/2009 12:15) Added new observation of K SERUM: 4.6 meq/L (09/13/2009 12:15) Added new observation of HGBA1C: 5.5 % (09/13/2009 12:15) Added new observation of HGB: 13.3 g/dL (03/47/4259 56:38) Added new observation of HCT: 39.4 % (09/13/2009 12:15) Added new observation of BG RANDOM: 125 mg/dL (75/64/3329 51:88)

## 2010-03-21 NOTE — Assessment & Plan Note (Signed)
Summary: pt to see jeanie /rh   Vital Signs:  Patient profile:   54 year old female Height:      60.5 inches Weight:      170.2 pounds BMI:     32.81  Vitals Entered By: Wyona Almas PHD (March 12, 2010 10:48 AM)  History of Present Illness: Assessment:  Spent 30 min w/ pt.  Becky [name] has not been able to exercise b/c of knee pain.  Her R knee has not responded well to the most recent injection.  Food record suggests average intake of 545 kcal & 45 g proten.  Intake has been very repetitive, i.e., Becky [name] ate exactly the same bkfst, lunch, and dinner on 5 days of 8 days, and while most days were <500 kcal, Becky [name] had at least one day of >1000 kcal.  Wt continues to increase, which is to be expected with little or no exercise.    Nutrition Diagnosis:  Some progress on inadequate oral food intake (NI-2.1) related to poor appetite as evidenced by no meals skipped in previous 21 days, although most meals were small, and some as little as 100 kcal.  No progress on physical inactivity (NB-2.1) related to pain as evidenced by walking 3 X during the past 21 days.  Disordered eating pattern (NB-1.5) related to recurrent food restriction as evidenced by self-reported continued weight anxiety.   Intervention:  See Pt Instructions.    Monitoring/Eval: Dietary intake, body weight, and exercise at 3-wk F/U.     Allergies: 1)  ! * Ms Contin, Percocette 2)  ! * Methadone 3)  ! Macrodantin 4)  Penicillin 5)  Aspirin 6)  Biaxin   Complete Medication List: 1)  Clonazepam 1 Mg Tabs (Clonazepam) .... Am, noon, and dinner time. 2)  Geodon 40 Mg Caps (Ziprasidone hcl) .... 40 mg morning and dinnertime. 3)  Geodon 80 Mg Caps (Ziprasidone hcl) .... 80 mg at bedtime. 4)  Wellbutrin Xl 150 Mg Xr24h-tab (Bupropion hcl) .... Take 1 pill in the morning along with 1 300-mg wellbutrin for total of 450 mg. 5)  Seroquel Xr 200 Mg Xr24h-tab (Quetiapine fumarate) .... 200 mg (1 pill) at 3-4 pm. 6)  Alprazolam  0.5 Mg Tabs (Alprazolam) .... 0.5 mg (1 pill) as needed. 7)  Meloxicam 7.5 Mg Tabs (Meloxicam) .... 7.5 mg (1 pill) in the morning. 8)  Zyrtec Allergy 10 Mg Tabs (Cetirizine hcl) .Marland Kitchen.. 10 mg (1 pill) in the morning. 9)  Carafate 1 Gm Tabs (Sucralfate) .Marland Kitchen.. 1 g (1 pill) morning and bedtime. 10)  Cvs Daily Multiple Women 50+ Tabs (Multiple vitamins-minerals) .Marland Kitchen.. 1 multivitamin-mineral daily. 11)  D3-50 50000 Unit Caps (Cholecalciferol) .... (actually d2) 50,000 international units every other week. 12)  Clonazepam Odt 2 Mg Tbdp (Clonazepam) .... 2 pills at bedtime. 13)  Cymbalta 30 Mg Cpep (Duloxetine hcl) .... Take 1 pill in am with 2 x 60-mg cymbalta for total of 150 mg. 14)  Cymbalta 60 Mg Cpep (Duloxetine hcl) .... Take 2 pills am along with 1 30-mg cymbalta for total of 150 mg. 15)  Wellbutrin Xl 300 Mg Xr24h-tab (Bupropion hcl) .... Take 1 pill in the morning along with 150-mg wellbutrin for total of 450 mg. 16)  Trazodone Hcl 150 Mg Tabs (Trazodone hcl) .... Take 1/2 pill at bedtime. 17)  Tramadol Hcl 50 Mg Tabs (Tramadol hcl) .Marland Kitchen.. 1-2 by mouth two times a day as needed knee pain  Other Orders: Reassessment Each 15 min unit- [facility] (60454)  Patient Instructions: 1)  Protein sources:  Eggs, tuna, Boca Burgers, beans, (and dairy).  2)  Get more variety! 3)  Veg's:  Get more variety.  Suggestions:  Stir-fry (with garlic) leafy greens; roasted veg's (400 degrees, sprayed with oil); spaghetti squash.  4)  Write a list of 7(+) dinner meals.   5)  Google recipes for veg's.    6)  Schedule nutr appt for Feb 20.     Orders Added: 1)  Reassessment Each 15 min unit- [facility] [[id number]]

## 2010-04-02 ENCOUNTER — Ambulatory Visit: Payer: Self-pay

## 2010-04-05 ENCOUNTER — Ambulatory Visit: Payer: Medicare Other | Admitting: Family Medicine

## 2010-04-05 NOTE — Progress Notes (Signed)
Medical Nutrition Therapy:  Appt start time: 0900 end time:  0930.  Assessment:  Primary concerns today: disordered eating. Physical activity has limited due to abdominal pain as well as knee pain.    Becky [name] is now seeing Dr. Ludwig Clarks for her PCP.  Dr. Ludwig Clarks requisitioned an x-ray of her abdominal area where she has been having pain, worsened by an accident with her dog last week.  Results pending.  She has an appt with neurologist Dr. Sandria Manly on Mar 20 regarding her hand tremor, which has been going on more than a year.  Questionable if this is medication-related.  Knee pain has improved a bit, after having discontinued PT, which was making abdominal pain worse since her accident last week.    Food records suggest an average protein intake of only ~40 g/day, as she has been relying more on cheese for protein, and while she has been getting veg's usually 2 X day, portion sizes are often not large.  Progress Towards Goal(s):  In progress.   Nutritional Diagnosis:  NB-1.5 Disordered eating pattern As related to weight anxiety.  As evidenced by expressed wt concerns and apparent restriction.   Intervention:  Nutrition Counseling.     Monitoring/Evaluation:  Dietary intake in 3 week(s).

## 2010-04-05 NOTE — Patient Instructions (Signed)
-   MORE protein:  Aim for 55 g/day, including a variety of sources.  - MORE veg's too:  Try to get daily amount of at least 4 cups cooked or double that amount if raw.   - Goal:  Try some grilled salmon in a restaurant or at your sister's house at least once.   - Ultimate goal:  Get back to exercise as tolerated.

## 2010-04-16 ENCOUNTER — Other Ambulatory Visit: Payer: Self-pay | Admitting: Gastroenterology

## 2010-04-16 DIAGNOSIS — R1031 Right lower quadrant pain: Secondary | ICD-10-CM

## 2010-04-20 ENCOUNTER — Ambulatory Visit
Admission: RE | Admit: 2010-04-20 | Discharge: 2010-04-20 | Disposition: A | Discharge status: Home / Self Care | Payer: Medicare Other | Source: Ambulatory Visit | Attending: Gastroenterology | Admitting: Gastroenterology

## 2010-04-20 DIAGNOSIS — R1031 Right lower quadrant pain: Secondary | ICD-10-CM

## 2010-04-26 ENCOUNTER — Ambulatory Visit (INDEPENDENT_AMBULATORY_CARE_PROVIDER_SITE_OTHER): Payer: Medicare Other | Admitting: Family Medicine

## 2010-04-26 DIAGNOSIS — F509 Eating disorder, unspecified: Secondary | ICD-10-CM

## 2010-04-26 NOTE — Patient Instructions (Signed)
Food goals:   1. Aim for your protein goal of 55 g per day. (Greek yogurt may be esier to tolerate b/c it's more protein for less volume.   2. Vegetables and/or fruit.   3. Follow Catherine's advice:  One day at a time; increase walking as tolerated.  Do only what you can!

## 2010-04-26 NOTE — Progress Notes (Signed)
Medical Nutrition Therapy:  Appt start time: 1030 end time:  1130.  Assessment:  Primary concerns today: anorexia nervosa. Meerab saw Dorena Cookey for upper GI and small bowel study, all negative, including dx of Crohn's disease.  He referred her back to Dr. Ludwig Clarks. Dr. Daphine Deutscher ruled out a hernia w/ CT scan.  Abdominal pain is upper R quadrant; recently has felt worse after eating, especially larger meals.  Diarrhea and nausea daily X 2 wks, but no vomiting.  Food records indicate that Becky [name] has reduced intake b/c of nausea.  In the  Past week, Becky [name] had 4 days of only one meal and 3 days of 2 meals.    Nutritional Diagnosis:  NB-1.5 Disordered eating pattern As related to weight anxiety.  As evidenced by expressed wt concerns and apparent restriction.   Intervention:  Nutrition counseling.     Monitoring/Evaluation:  Dietary intake in 3 weeks.

## 2010-05-02 ENCOUNTER — Other Ambulatory Visit: Payer: Self-pay | Admitting: Family Medicine

## 2010-05-04 ENCOUNTER — Other Ambulatory Visit: Payer: Self-pay | Admitting: Neurology

## 2010-05-04 DIAGNOSIS — G20 Parkinson's disease: Secondary | ICD-10-CM

## 2010-05-05 ENCOUNTER — Ambulatory Visit
Admission: RE | Admit: 2010-05-05 | Discharge: 2010-05-05 | Disposition: A | Discharge status: Home / Self Care | Payer: Medicare Other | Source: Ambulatory Visit | Attending: Neurology | Admitting: Neurology

## 2010-05-05 DIAGNOSIS — G20 Parkinson's disease: Secondary | ICD-10-CM

## 2010-05-08 ENCOUNTER — Other Ambulatory Visit: Payer: Self-pay | Admitting: Family Medicine

## 2010-05-08 ENCOUNTER — Other Ambulatory Visit: Payer: Self-pay | Admitting: *Deleted

## 2010-05-08 MED ORDER — TRAMADOL HCL 50 MG PO TABS: 50.0000 mg | ORAL_TABLET | Freq: Two times a day (BID) | ORAL | Status: DC

## 2010-05-15 ENCOUNTER — Ambulatory Visit (INDEPENDENT_AMBULATORY_CARE_PROVIDER_SITE_OTHER): Payer: Medicare Other | Admitting: Family Medicine

## 2010-05-15 DIAGNOSIS — F509 Eating disorder, unspecified: Secondary | ICD-10-CM

## 2010-05-15 NOTE — Progress Notes (Signed)
Medical Nutrition Therapy:  Appt start time: 1030 end time:  1130.  Assessment:  Primary concerns today: anorexia nervosa. Richele has continued to have upper R quadrant pain, but no health care provider she's seen has been able to pinpoint the problem, although Dr. Madilyn Fireman said it may just be related to her gastroparesis.  It sometimes wakes her in the night.  Her knees are better this week, but still painful if she walks sometimes.  Jahmia walked 60 min and 30 min last week.  With all her medical problems, she is feeling very depressed recently, and is not doing much other than watching TV and using the computer.  In addn, Dr. Ludwig Clarks has diagnosed [name] with pre-diabetes, and has referred her to the [facility] for classes and to get a glucometer.  Food record suggests that Jaskirat did much better on protein intake (average of 56 g/day), but total kcal intake in the past 11 days ranged from 380-700, and in 3 wks of food records, she had only 2 fruits and 4 veg servings.    Nutritional Diagnosis:  NB-1.5 Disordered eating pattern as related to weight anxiety as evidenced by expressed wt concerns and apparent restriction.   Intervention:  Nutrition counseling.     Monitoring/Evaluation:  Dietary intake and physical activity in 3 weeks.

## 2010-05-15 NOTE — Patient Instructions (Addendum)
-   Walking:  Limit to 20 min at a time.  Aim for several times a week as tolerated.   - Google:  Non-exercise Activity Thermogenesis (NEAT).  - Make a plan for getting out of the house at least once a day.  Email [name] this plan, and track your outings on your food record.    - Continue the good job on protein intake.  Include a source of protein with each meal.   - VEGETABLES (nonstarchy) twice a day.  (Starchy veg's include potatoes, corn, peas.)

## 2010-05-24 ENCOUNTER — Encounter: Payer: Medicare Other | Admitting: *Deleted

## 2010-06-05 ENCOUNTER — Encounter: Payer: Self-pay | Admitting: Family Medicine

## 2010-06-05 ENCOUNTER — Ambulatory Visit (INDEPENDENT_AMBULATORY_CARE_PROVIDER_SITE_OTHER): Payer: Medicare Other | Admitting: Family Medicine

## 2010-06-05 DIAGNOSIS — R7309 Other abnormal glucose: Secondary | ICD-10-CM

## 2010-06-05 NOTE — Patient Instructions (Addendum)
-   Hemoglobin A1C is a measure of your blood sugars over time (~8 wks).  Ideally, this number would be below 6.   - Keeping carb's low in your diet, and especially high-glycemic carb foods, will lower your blood sugar, and will also help you lose weight.  High-glycemic foods include sweet drinks, including juice, white flour products or starchy foods without fiber, candy and most desserts, white potatoes, corn, corn flakes and other low-/non-fiber cereals, rice (especially white), bananas, and pasta (especially white).  How much a food raises blood sugar is a function of the glycemic index AS WELL AS the amount of that food eaten.  For example, if you like bananas, eat 1/2 at a time.   - Lower glycemic starchy foods include sweet potatoes, small quantities of high-fiber cereals or grains, whole fruit (vs. canned or juice), and beans.  Non-carbohydrate foods such as meat, fish, poultry, eggs, soy, and dairy foods are low-glycemic foods.   - Physical activity goal:  Walk up to 20 min at least 3 X wk, beginning today.  REMINDER:  WHEN YOU DON'T FEEL LIKE WALKING, THAT'S PROBABLY WHEN YOU NEED IT MOST.

## 2010-06-05 NOTE — Progress Notes (Signed)
Medical Nutrition Therapy:  Appt start time: 1030 end time:  1130.  Assessment:  Primary concerns today: anorexia nervosa. Shimika has not been walking in the past three weeks, mainly b/c of depression.  She is finding it hard to motivate herself.  She has kept daily food records, which indicate that she has been doing better in getting veg's 2 X day.  Food records suggest an intake of 55 g protein on average and 410 - 1110 kcal/day.  (High intake day included peanut butter 2 X.)  Jaedin is going to talk with Dr. Ludwig Clarks to find out if she wants her to be checking BG levels at home.  If so, she will see an RD at [facility] for training.    Progress:  Food records indicate some progress in terms of better balance of CHO and protein.   Nutritional Diagnosis:  NB-1.5 Disordered eating pattern as related to weight anxiety as evidenced by expressed wt concerns and apparent restriction.     Intervention:  Nutrition counseling.     Monitoring/Evaluation:  Dietary intake and physical activity in 3 weeks.

## 2010-06-25 ENCOUNTER — Ambulatory Visit (INDEPENDENT_AMBULATORY_CARE_PROVIDER_SITE_OTHER): Payer: Medicare Other | Admitting: Family Medicine

## 2010-06-25 VITALS — BP 122/84

## 2010-06-25 DIAGNOSIS — M1710 Unilateral primary osteoarthritis, unspecified knee: Secondary | ICD-10-CM

## 2010-06-25 DIAGNOSIS — IMO0002 Reserved for concepts with insufficient information to code with codable children: Secondary | ICD-10-CM

## 2010-06-25 NOTE — Progress Notes (Signed)
  Subjective:    Patient ID: Becky [name], female    DOB: 02-25-1955, 55 y.o.   MRN: [id number]  HPI  Bilateral knee pain. Her right knee improved after the injection 3 months ago and was good until about 2 weeks ago. The left knee had an injection about 6 months ago and did not develop pain until about one month ago. Pain is worse with activity, located centrally in the knee. No giving way or locking. Pain is aching 4-6/10. No nighttime pain no warmth or redness noted on the joints.  Review of Systems No fever no unusual weight gain or loss.    Objective:   Physical Exam    well-developed well-nourished no acute distress KNEE:S external changes of osteoarthritis. No effusion, no erythema, no warmth. Full range of motion in flexion and extension. The popliteal space is soft. The calf is soft bilaterally distally she is neurovascularly intact. INJECTION: Patient was given informed consent, signed copy in the chart. Appropriate time out was taken. Area prepped and draped in usual sterile fashion. 1 cc of kenalog plus  4 cc of lidocaine was injected into the each knee  using a(n) anterior approach. The patient tolerated the procedure well. There were no complications. Post procedure instructions were given.     Assessment & Plan:  OA B knees CSI injections rtc prn

## 2010-06-26 ENCOUNTER — Ambulatory Visit (INDEPENDENT_AMBULATORY_CARE_PROVIDER_SITE_OTHER): Payer: Medicare Other | Admitting: Family Medicine

## 2010-06-26 ENCOUNTER — Telehealth: Payer: Self-pay | Admitting: Family Medicine

## 2010-06-26 DIAGNOSIS — F509 Eating disorder, unspecified: Secondary | ICD-10-CM

## 2010-06-26 DIAGNOSIS — R7309 Other abnormal glucose: Secondary | ICD-10-CM

## 2010-06-26 NOTE — Op Note (Signed)
NAME:  Becky [name], Becky [name]         ACCOUNT NO.:  [id number]   MEDICAL RECORD NO.:  [id number]          PATIENT TYPE:  AMB   LOCATION:  ENDO                         FACILITY:  [facility]   PHYSICIAN:  John C. Madilyn Fireman, M.D.    DATE OF BIRTH:  Aug 22, 1955   DATE OF PROCEDURE:  11/11/2006  DATE OF DISCHARGE:                               OPERATIVE REPORT   PROCEDURE:  Esophagogastroduodenoscopy with Botox injection   ENDOSCOPIST:  Everardo All. Madilyn Fireman, M.D.   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   PROCEDURE:  The patient was placed in the left lateral decubitus  position and placed on the pulse monitor with a continuous low-flow  oxygen delivered by nasal cannula.  She was sedated with 12.5 mg of IV  Phenergan and 75 mcg IV fentanyl and 8 mg IV Versed.  The Olympus video  endoscope was advanced under direct vision into the oropharynx and  esophagus.  The esophagus was straight and of normal caliber with the  squamocolumnar line at 38 cm.  There was no visible hiatal hernia, ring,  stricture or other abnormality of the GE junction or esophagus.  The  stomach was entered and a small amount of liquid secretions were  suctioned from the fundus.  A retroflexed view of the cardia was  unremarkable.  The fundus, body and pylorus all appeared normal.  The  duodenum was entered and both the bulb and second portion were well  inspected and appeared to be within normal limits.  The scope was drawn  back into the prepyloric area and Botox was injected in the pyloric  sphincter in 4 aliquots of 25 units each.  The scope was then withdrawn  and the patient returned to the recovery room in stable condition.  She  tolerated the procedure well and there were no immediate complications.   IMPRESSION:  1. Basically normal  endoscopy, status post Botox injection for      refractory gastroparesis.   PLAN:  Advance diet and observe response to today's treatment.           ______________________________  Everardo All. Madilyn Fireman, M.D.     J[facility]/MEDQ  D:  11/11/2006  T:  11/11/2006  Job:  [id number]   cc:   [name]. Andrey Campanile, M.D.  Fax: [phone]   [place] Daphine Deutscher, MD  [address], [address]  [place]  [place] [address]

## 2010-06-26 NOTE — Telephone Encounter (Signed)
Ms. Taylor called back to inquire about a cell phone she thought may have been left in your office.  She also would like you to email her or call her about the D3 medicine she got from [employer].

## 2010-06-26 NOTE — Op Note (Signed)
NAME:  Becky [name], Becky [name]         ACCOUNT NO.:  [id number]   MEDICAL RECORD NO.:  [id number]          PATIENT TYPE:  AMB   LOCATION:  ENDO                         FACILITY:  [facility]   PHYSICIAN:  John C. Madilyn Fireman, M.D.    DATE OF BIRTH:  [birth year]   DATE OF PROCEDURE:  08/05/2007  DATE OF DISCHARGE:                               OPERATIVE REPORT   INDICATIONS FOR PROCEDURE:  Gastroparesis refractory to medical therapy  but with ascending improvement after Botox injection 1 year ago.  She is  having recurrent early satiety and nausea and bloating, however, similar  to her presenting symptoms.   PROCEDURE:  The patient was placed in the left lateral decubitus  position and placed on the pulse monitor with continuous low-flow oxygen  delivered by nasal cannula.  She was sedated with 75 mcg IV fentanyl and  7 mg IV Versed.  The Olympus video endoscope was advanced under direct  vision into the oropharynx and esophagus.  The esophagus was straight  and of normal caliber with squamocolumnar line at 38 cm.  There was no  visible hiatal hernia ring or other abnormality.  The stomach was  entered.  A small amount of liquid secretions were suctioned under the  retroflexed view of the cardia.  The retroflexed view of the cardia was  unremarkable.  Fundus, body, antrum, and pylorus all appeared normal.  Duodenum was entered.  Both bulb and second portion were inspected,  appeared be within normal limits.  The scope was positioned proximal to  the pylorus and both of botulinum toxin was injected and 25 units  aliquots in each of four quadrants.  The scope was then withdrawn and  the patient returned to the recovery room in stable condition.  She  tolerated the procedure well.  There were no immediate complications.   IMPRESSION:  Normal endoscopy, status post injection of botulinum toxin  into the pylorus.   PLAN:  We will advance diet and observe response to treatment.     ______________________________  Everardo All. Madilyn Fireman, M.D.     JCH/MEDQ  D:  08/05/2007  T:  08/06/2007  Job:  [id number]   cc:   [name]. Andrey Campanile, M.D.

## 2010-06-26 NOTE — Patient Instructions (Addendum)
-   Along with recording your daily FBG levels, determine your weekly averages.   - If you see a very high or low reading, immediately look to yesterday's intake to figure out why your BG is off:  Look at what was the last meal/snack you ate, how much, and what time.   - Consistency in intake:  Last set of food records, your intake ranged from ~400 to 1100.  This will not help you manage your weight.   - Aim for that 55 grams of protein daily.

## 2010-06-26 NOTE — Op Note (Signed)
NAME:  Becky [name], Becky [name]         ACCOUNT NO.:  [id number]   MEDICAL RECORD NO.:  [id number]          PATIENT TYPE:  AMB   LOCATION:  ENDO                         FACILITY:  [facility]   PHYSICIAN:  John C. Madilyn Fireman, M.D.    DATE OF BIRTH:  06/24/1955   DATE OF PROCEDURE:  08/11/2008  DATE OF DISCHARGE:                               OPERATIVE REPORT   PROCEDURE:  Esophagogastroduodenoscopy with injection of botulinum  toxin.   INDICATIONS FOR PROCEDURE:  Medically-refractory gastroparesis that has  responded to injection of botulinum toxin approximately 1 year ago.  She  has had recurrent symptoms of early satiety, abdominal pain, bloating  and nausea similar to her previous symptoms.   PROCEDURE:  The patient was placed in the left lateral decubitus  position and placed on the pulse monitor with continuous low-flow oxygen  delivered by nasal cannula.  She was sedated with 100 mcg IV fentanyl,  10 mg IV Versed and 12.5 mg IV Benadryl.  The Olympus video endoscope  was advanced under direct vision into the oropharynx and esophagus.  The  esophagus was straight and of normal caliber with the squamocolumnar  line at 38 cm.  There was no visible hiatal hernia, ring, stricture or  other abnormality of the GE junction.  The stomach was entered and a  small amount of liquid secretions were suctioned from the fundus.  Retroflexed view of the cardia was unremarkable.  There was some bile-  stained, semisolid material in the fundus as well.  Otherwise, the  fundus, body and antrum appeared normal.  The pylorus appeared  relatively normal with a small amount of heaped-up mucosa just proximal  to it but no endoscopic impression of stenosis, and the scope passed  rather easily into a normal-looking duodenum.  The scope was withdrawn  back into the distal antrum and botulinum toxin was injected in 4  aliquots in each of 4 quadrants at 25 units intramuscularly.  This  appeared to go smoothly without any  extravasation or misdirected  injection.  The scope was then withdrawn and the patient returned to the  recovery room in stable condition.  She tolerated the procedure well,  and there were no immediate complications.   IMPRESSION:  Endoscopically normal study with known gastroparesis,  status post injection of botulinum toxin for reasons outlined above.   PLAN:  Will advance diet and observe her response to today's therapy.  She will call if not improved.           ______________________________  Everardo All Madilyn Fireman, M.D.     JCH/MEDQ  D:  08/11/2008  T:  08/11/2008  Job:  [id number]   cc:   Ace Gins, MD

## 2010-06-26 NOTE — Progress Notes (Signed)
Medical Nutrition Therapy:  Appt start time: 1030 end time:  1130.  Assessment:  Primary concerns today: Elevated blood glucose (pre-DM) levels and anorexia nervosa. Shyanne got both knees injected yesterday, and she thinks they feel a bit better already.  (Last injxn was in January.)  Exercise in the past few weeks has including gardening (at least an hour a day) with her sister, but no walking.  Food records show one skipped meal per 3 weeks, kcal intake range of [phone], and average protein intake of 47 grams/day.  Some "meals" consist of nothing but canned tuna (plain) and small salad with no dressing.  Bonny now has a glucometer, and has been checking FBG most days.  Levels have been 103-142.    Progress:  Food records indicate some progress in terms of better balance of CHO and protein.    Nutritional Diagnosis:  NB-1.5 Disordered eating pattern as related to weight anxiety as evidenced by expressed wt concerns and apparent restriction.     Intervention:  Nutrition counseling.     Monitoring/Evaluation:  Dietary intake, body weight, BG levels, and physical activity in 3 weeks.

## 2010-06-29 ENCOUNTER — Encounter: Payer: Self-pay | Admitting: Family Medicine

## 2010-06-29 ENCOUNTER — Ambulatory Visit (INDEPENDENT_AMBULATORY_CARE_PROVIDER_SITE_OTHER): Payer: Medicare Other | Admitting: Family Medicine

## 2010-06-29 VITALS — BP 130/93

## 2010-06-29 DIAGNOSIS — R079 Chest pain, unspecified: Secondary | ICD-10-CM

## 2010-06-29 DIAGNOSIS — R0781 Pleurodynia: Secondary | ICD-10-CM

## 2010-06-29 NOTE — Discharge Summary (Signed)
[facility]  Patient:    Becky [name], Becky [name] Visit Number: [id number] MRN: [id number]          Service Type: SUR Location: 3W 0349 01 Attending Physician:  Katha Cabal Dictated by:   Thornton Park Daphine Deutscher, M.D. Admit Date:  03/20/2001 Discharge Date: 03/26/2001                             Discharge Summary  CHIEF COMPLAINT:  Recurrent gastroesophageal reflux disease.  HISTORY OF PRESENT ILLNESS:  Becky [name] is a 55 year old lady who had a Nissen fundoplication approximately two years ago for chronic hoarseness and pharyngeal reflux.  She got along initially fine, but has developed recurrent pain and whether it was possibly related to some perioperative vomiting before, has developed herniation above her wrap.  This has been studied endoscopically.  Also, she had a 24-hour pH study.  She was seen preoperatively and was informed about the risks of having to do this open, as well as the risk of failure and she is aware of that.  CURRENT MEDICATIONS:  1. Celexa 100 mg one q.d.  2. Wellbutrin SR 150 mg one q.d.  3. Trazodone 100 mg one at bedtime.  4. Seroquel 650 mg one at bedtime.  5. Klonopin 2 mg one tablet three times a day.  6. Nexium 40 mg b.i.d.  7. Clarinex 5 mg once in the morning.  8. Feldene 20 mg once in the morning.  9. Magnesium sulfate 10 mg one at the onset of migraine. 10. Reglan 10 mg one before each meal and at bedtime. 11. Zantac 150 mg b.i.d. 12. Meclozine 25 mg one four times a day. 13. Meperidine promethazine as needed. 14. Darvocet-N 100 as needed. 15. Cardizem CD 240 mg. 16. Ibuprofen 200 mg every four to six hours as needed. 17. Nu-Iron 150 mg one per day. 18. NuLev 0.125 mg before meals for abdominal pain. 19. Allergy shots. 20. Advair Diskus. 21. Nasonex. 22. Astelin.  COURSE IN THE HOSPITAL:  Becky [name] underwent a laparoscopic redo Nissen fundoplication on March 20, 2001.  Postoperatively he did  well.  She has had anemia and we did give her one unit of transfusion just to kind of get her hemoglobin up.  She takes many medicines as indicated above.  She made slow improvement and was ready for discharge on March 26, 2001, taking a full liquid diet.  Her incisions were healing nicely. Dictated by:   Thornton Park Daphine Deutscher, M.D. Attending Physician:  Katha Cabal DD:  03/31/01 TD:  03/31/01 Job: [id number]

## 2010-06-29 NOTE — Procedures (Signed)
[facility]  Patient:    Becky [name], Becky [name] Visit Number: [id number] MRN: [id number]          Service Type: END Location: ENDO Attending Physician:  Mervin Hack Dictated by:   Hedwig Morton. Juanda Chance, M.D. [facility] Proc. Date: 03/18/01 Admit Date:  03/18/2001   CC:         Thornton Park. Daphine Deutscher, M.D.                           Procedure Report  PROCEDURE:  Upper endoscopy and colonoscopy.  INDICATIONS:  This 55 year old white female has history of chronic gastroesophageal reflux for which she underwent Nissen fundoplication.  Last endoscopy was done in June 2002.  The patient has had a breakdown of the Nissen fundoplication for some reason and is scheduled to have another re-do this week.  She was found to have a hemoglobin of 8.8 g with microcytic indices.  She denies heavy periods.  She is undergoing upper and lower endoscopy to assess her microcytic anemia.  ENDOSCOPE:  Olympus single-channel videoscope.  SEDATION:  Versed 12 mg IV, Demerol 100 mg IV.  FINDINGS:  Olympus single-channel videoscope passed under direct vision into the posterior pharynx into the esophagus.  The patient was monitored by pulse oximetry.  Her oxygen saturations were normal.  She was very cooperative. Proximal and mid esophageal mucosa was normal.  In the GE junction at 35 cm from the incisors was a linear ulcer within a fold which was measured about 3 mm in length, and it had a whitish exudate covering the ulcer crater.  It was a superficial ulceration.  There were no signs of active bleeding from it, and also there was no stricture.  There was no significant hiatal hernia.  Stomach:  The stomach was insufflated with air and showed a moderate amount of bilious material in the stomach which was easily suctioned out.  Gastric mucosa was essentially normal with normal gastric antrum and pyloric outlet.  Duodenum:  The duodenum, duodenal bulb, and descending duodenum was  normal. Endoscope was then brought back into the stomach, retroflexed.  Fundus and cardia appeared unremarkable.  IMPRESSION: 1. 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 2. Status post breakdown of Nissen fundoplication.  PLAN:  Colonoscopy.  ENDOSCOPE:  Olympus single-channel videoscope.  SEDATION:  Additional Versed 3 mg IV, Demerol 20 mg IV.  FINDINGS:  Olympus single-channel videoscope passed routinely through rectum to the sigmoid complex.  The patient was again monitored by pulse oximetry. Oxygen saturations were normal.  Her prep was fair.  Anal canal, rectum, and ______  was unremarkable.  The sigmoid colon was normal with some spasm in the sigmoid colon but essentially normal-appearing mucosa.  There were no diverticula.  The splenic flexure, transverse colon, hepatic flexure, and ascending colon was unremarkable.  Cecal pouch was reached without difficulty and was irrigated with a large amount of water and showed normal-appearing mucosa, appendiceal opening, and ileocecal valve.  The terminal ileum was not intubated.  The colonoscope was then retracted, the colon decompressed.  No polyps or other lesions were found.  IMPRESSION:  Normal colonoscopy to the cecum.  PLAN:  The patients anemia is most likely related to chronic gastrointestinal blood loss related to use of NSAIDs and COX-2 inhibitors with a resulting small ulceration of the gastroesophageal junction.  She is to continue her Nexium and Zantac, and she is also scheduled for re-do of the Nissen fundoplication later this  week.  We will begin oral iron supplements. Dictated by:   Hedwig Morton. Juanda Chance, M.D. [facility] Attending Physician:  Mervin Hack DD:  03/18/01 TD:  03/19/01 Job: [id number]

## 2010-06-29 NOTE — H&P (Signed)
[facility]  Patient:    Becky [name], Becky [name] Visit Number: [id number] MRN: [id number]          Service Type: SUR Location: 3W 0349 01 Attending Physician:  Katha Cabal Dictated by:   Thornton Park Daphine Deutscher, M.D. Admit Date:  03/20/2001 Discharge Date: 03/26/2001                           History and Physical  CHIEF COMPLAINT:  Recurrent gastroesophageal reflux disease.  HISTORY OF PRESENT ILLNESS:  The patient is a 55 year old lady who had initially had a Nissen fundoplication two years ago for chronic hoarseness and pharyngeal reflux.  She got along fine initially although did have some perioperative vomiting.  She began having some problems with heartburn which was recurrent, and a 24-hour study demonstrated that she was having repeat reflux and also had developed a small hiatal hernia, recurrent.  MEDICATIONS:  Are well delineated in the chart, but include some 22 medications.  PAST SURGICAL HISTORY:  As indicated above.  REVIEW OF SYSTEMS:  Positive for significant psychiatric problems requiring medication.  History of anorexia nervosa requiring a feeding tube.  PHYSICAL EXAMINATION:  GENERAL:  Well-developed, well-nourished white female.  HEENT:  Somewhat pale.  Nose and throat exams unremarkable.  NECK:  Supple.  CHEST:  Breath sounds are equal bilaterally.  ABDOMEN:  Multiple well-healed incisions from previous laparoscopic surgery. No tenderness to palpation.  PELVIC:  Not performed.  EXTREMITIES:  Full range of motion.  NEUROLOGIC:  Alert and oriented x3.  Motor and sensory function are grossly intact.  IMPRESSION:  Recurrent gastroesophageal reflux disease.  PLAN:  Laparoscopic redo Nissen fundoplication. Dictated by:   Thornton Park Daphine Deutscher, M.D. Attending Physician:  Katha Cabal DD:  05/04/01 TD:  05/05/01 Job: [id number]

## 2010-06-29 NOTE — Op Note (Signed)
[facility]  Patient:    Becky [name], Becky [name] Visit Number: [id number] MRN: [id number]          Service Type: SUR Location: 3W 0349 01 Attending Physician:  Katha Cabal Dictated by:   Thornton Park Daphine Deutscher, M.D. Proc. Date: 03/20/01 Admit Date:  03/20/2001   CC:         Becky [name]. Andrey Campanile, M.D.  Hedwig Morton. Juanda Chance, M.D. [facility]   Operative Report  PREOPERATIVE DIAGNOSIS:  Failed laparoscopic Nissen fundoplication from September 29, 1998.  SURGEON:  Thornton Park. Daphine Deutscher, M.D.  ASSISTANT:  Sharlet Salina T. Hoxworth, M.D.  PROCEDURE:  Re-do laparoscopic Nissen with closure of the hiatus with complete take-down of prior Nissen and re-do.  INDICATIONS:  Becky [name] underwent a laparoscopic Nissen fundoplication with closure of her crura and take-down of a PEG site and repair of a ventral hernia on September 29, 1998.  At that time, nice closure of the crura was done, and her wrap was done over a 56 dilator.  Postoperatively, she did not have any immediate complaints.  This was done mainly for hoarseness and what was felt to be laryngopharyngeal reflux.  Subsequently, her hoarseness has gotten better, but now she has been having some heartburn.  A 24-hour pH probe did reveal evidence of some positive reflux, and an upper endoscopy was performed yesterday which showed some ulceration at the wrap.  I had an upper GI performed in December 2002, which indicated that there appeared to be some of the stomach that had herniated above the wrap.  She and her husband were seen in the office, and informed consent was obtained regarding re-do either laparoscopically or open.  This was reiterated to her on the day of the surgery, and she is aware of the risk of not only failure of the wrap to relieve her symptoms but the dangers of this surgery not limited to esophageal or stomach perforation, organ injury.  DESCRIPTION OF PROCEDURE:  Ms. Becky [name] was taken to room 1 and given  general anesthesia.  The abdomen was prepped with Betadine and draped sterilely.  I made a longitudinal incision above her umbilicus and through a pursestring suture, inserted the Hasson cannula.  There were some anterior abdominal adhesions from her previous hernia and after placing two other ports, I used the harmonic scalpel to take these down.  The 5 mm upper midline was used for the liver retractor with two 10-11s on the right and one on the left.  Dissection began taking down what appeared to be the wrap stuck to the left lateral segment.  This was done with sharp dissection using scissors as well as with the harmonic scalpel.  I used the sutures, and staples and clips were adherent to the liver, and I stayed on top of those and mobilized this up to the anterior diaphragm.  I then went along and dissected free the right and left crus, although this was somewhat of a tedious dissection because of the tremendous inflammatory response.  I was able to then see the wrapped portion of the stomach which appeared to have slipped down onto the stomach and after freeing this up, I got a well-adequate mobilization of esophagus into the abdomen.  I then took the wrap down off its location by dividing the sutures medially and then teasing it away.  I then brought it back all the way around. I had completely mobilized it before.  This time, I went ahead and first repaired the crura with  two simple sutures posteriorly using the EndoStitch and one figure-of-eight suture anteriorly.  Because this was a re-do, I did not wish to risk perforation with a Bougie and elected to not place that down.  I then got around behind the vagus nerve, and this showed to indicate that we did have a good length of esophagus mobilized and after repairing the hiatus, I elected to do my wrap between the vagus nerve and the esophagus, again to help hold it into position.  The wrap was then brought around and without  any tension at all, it was sutured to the distal esophagus with three sutures using the EndoStitch and with intracorporeal ties.  Since there had been several other clips placed, I elected to not put any clips on the knots.  The wrap looked healthy and is depicted in the chart.  There was essentially no bleeding at this point.  There was no evidence of perforation of the stomach or esophagus.  The retractor was removed.  We surveyed the abdomen, and no other abnormalities were noted.  The wounds were injected with 0.5% Marcaine. The umbilical port was tied down with the pursestring suture.  The abdomen was deflated.  The wounds were injected with Marcaine and then closed with 4-0 Vicryl with staples as well.  The patient seemed to tolerate this procedure and was taken to the recovery room in satisfactory condition.  FINAL DIAGNOSIS:  Status post complete take-down and re-do of Nissen fundoplication laparoscopically as well as with laparoscopic hiatal hernia closure. Dictated by:   Thornton Park Daphine Deutscher, M.D. Attending Physician:  Katha Cabal DD:  03/20/01 TD:  03/21/01 Job: [id number]

## 2010-06-29 NOTE — Assessment & Plan Note (Signed)
[facility]                         GASTROENTEROLOGY OFFICE NOTE   NAME:Trimarco, MARLYS STEGMAIER                MRN:          [id number]  DATE:02/28/2006                            DOB:          06-28-1955    Ms. Blasko is a 55 year old white female with a history of eating  disorder, pancreas divisum, percutaneous gastrostomy for failure to  thrive in 1996, Nissen fundoplication in 2000 for intractable  gastroesophageal reflux.  She is on multiple psychotropic medications  which has led to a severe gastroparesis.  Her gastric emptying scan  recently showed 92% of the radioactive trace still remaining in the  stomach after 2 hours.  The attempt to reduce those psychotropic  medications has been very slow and probably will be impossible because  patient needs all of her medications for her disorder.  I have sent her  to Dr. Luretha Murphy to assess possible surgical solution to her  gastroparesis.  Last upper endoscopy was done on October 29, 2005 and  showed to retain food in the stomach without evidence of gastric outlet  obstruction.  Last colonoscopy was done in 2003 and there were no  polyps.  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  It is a soft tissue mass.  Dr. Renelda Loma suggested repeating the  CT scan in 3 months for followup and possibly obtain biopsy of it.  The  patient continues to have abdominal pain with eating and  symptoms of  gastroparesis.  Her nutrition evaluation in the fall showed her  prealbumin to be low at 14, but her overall weight has been  satisfactory, in fact she is slightly overweight at 159-160 pounds.   MEDICATIONS:  Listed in the chart.   PHYSICAL EXAMINATION:  Blood pressure  92/54, pulse 60, weight 160  pounds.  She alert and orientated.  No distress.  LUNGS:  Clear to auscultation.  COR:  Normal S1, S2.  ABDOMEN:  Soft, but diffusely tender, nondistended with somewhat  hypoactive bowel sounds.  Marked tenderness in the left upper quadrant,  old PEG site well heeled.   IMPRESSION:  1. An 55 year old white female with gastroparesis status post Nissen      fundoplication.  Chronic abdominal pain possibly related to poor      gastric emptying.  She has failed promotility  agents Reglan .      Nutrition consult competed for gastroparesis diet.  2. Abnormal CT scan of the abdomen and raising the question of a soft      tissue mass in the left posterior lateral abdominal wall and the      right colon which may have to be biopsied.   PLAN:  1. Repeat CT scan of the abdomen with possible biopsies.  2. Patient is to follow a repeat colonoscopy for further evaluation of      her abdominal pain.  3. I will discuss further  plans for possible gastrojejunostomy with      Dr. Daphine Deutscher.  I am not sure weather this would really help gastric      emptying.  She certainly would empty better by gravity.  I think      that the prospect of reducing significantly her psychotropic      medications is      not realistic so we will probably have to deal with her      gastroparesis on a chronic bases or consider surgical intervention      which would facilitate gastric emptying.     Hedwig Morton. Juanda Chance, MD  Electronically Signed    DMB/MedQ  DD: 02/28/2006  DT: 02/28/2006  Job #: [id number]   cc:   Thornton Park. Daphine Deutscher, MD  [name]. Andrey Campanile, M.D.

## 2010-06-29 NOTE — Op Note (Signed)
NAMENIKITA, SURMAN               ACCOUNT NO.:  [id number]   MEDICAL RECORD NO.:  [id number]          PATIENT TYPE:  AMB   LOCATION:  DAY                          FACILITY:  [facility]   PHYSICIAN:  Thornton Park. Daphine Deutscher, MD  DATE OF BIRTH:  02-Aug-1955   DATE OF PROCEDURE:  DATE OF DISCHARGE:                                 OPERATIVE REPORT   CCS:  [id number].   PREOPERATIVE DIAGNOSES:  Chronic cholecystitis, cholelithiasis.   POSTOPERATIVE DIAGNOSES:  Chronic cholecystitis, cholelithiasis.   PROCEDURE:  Laparoscopic cholecystectomy with intraoperative cholangiogram.   SURGEON:  Thornton Park. Daphine Deutscher, MD.   ASSISTANT:  Danna Hefty, MD.   DESCRIPTION OF PROCEDURE:  [name] is a 55 year old lady with gallstones  and chronic upper abdominal pain. The abdomen was prepped with Betadine and  draped sterilely. A longitudinal incision was made down into her umbilicus  which I entered the abdomen without difficulty. The abdomen was insufflated  and three trocars then placed in the upper abdomen. The gallbladder was  pretty well walled off with omentum and was stuck. The gallbladder was  puckered in the liver which was worrisome somewhat. I was able to dissect  down and dissect Calot's triangle out and find the cystic duct and I did an  intraoperative cholangiogram which showed no stones and defined the anatomy.  I triple clipped the cystic artery and the cystic duct, divided them and  removed the gallbladder from the gallbladder bed with the hook  electrocautery. The patient had a gallbladder that was completely occupied  by a very hard chronic gallstone. Once it was detached, I spent some time  cauterizing the gallbladder bed. I placed the gallbladder in a bag with the  stone. One of the large stones I removed and extracted by crushing it. The  remaining portion the gallbladder removed through the umbilicus. The  gallbladder bed was irrigated and inspected  and no bleeding or bile leaks were  noted. The umbilical defect was repaired  with two sutures of #0 Vicryl. The wounds were closed with 4-0 Vicryl,  Benzoin and Steri-Strips. The patient tolerated the procedure well and was  taken to the recovery room in satisfactory condition.      Thornton Park Daphine Deutscher, MD  Electronically Signed     MBM/MEDQ  D:  10/31/2004  T:  11/01/2004  Job:  [id number]   cc:   [name]. Andrey Campanile, M.D.  Fax: [phone]   Lina Sar, M.D. [facility]  [address]  [place]  [place] [address]

## 2010-06-29 NOTE — Op Note (Signed)
Becky [name], Becky [name]                         ACCOUNT NO.:  [id number]   MEDICAL RECORD NO.:  [id number]                   PATIENT TYPE:  AMB   LOCATION:  DSC                                  FACILITY:  [facility]   PHYSICIAN:  Thornton Park. Daphine Deutscher, M.D.             DATE OF BIRTH:  [birth year]   DATE OF PROCEDURE:  07/05/2003  DATE OF DISCHARGE:                                 OPERATIVE REPORT   PREOPERATIVE DIAGNOSIS:  Recurrent ventral hernia inside a previous  percutaneous endoscopic gastrostomy.   POSTOPERATIVE DIAGNOSIS:  Recurrent ventral hernia inside a previous  percutaneous endoscopic gastrostomy.   OPERATION PERFORMED:   SURGEON:  Matthew B. Daphine Deutscher, M.D.   ANESTHESIA:   INDICATIONS FOR PROCEDURE:  Becky [name] is a 55 year old lady who had  previously had a PEG during a period of anorexia.  This had been closed with  some sutures previously and she has apparently had a recurrent little bulge  and some discomfort.  Informed consent was obtained regarding repair and its  limitations.   DESCRIPTION OF PROCEDURE:  She was taken to room 4 at [facility] on Jul 05, 2003 and given general by LMA.  The abdomen was prepped  with Betadine and draped sterilely.  I had previously identified the area  with her assistance and marked it and then I excised her old scar and went  down and found a mass about 2 cm in diameter.  It was spherical coming off  the fascia.  I dissected this free from the surrounding tissue and found a  properitoneal hernia fat through a defect smaller than a nickel.  I was able  to insert my finger and get it freed.  I thought this was too small a hole  for a Ventralex patch.  I therefore cut a piece of Marlex mesh in an oval  shape and tacked it at either end, transversely with horizontal mattress  sutures of 0 Prolene and then with three sutures in between incorporated  with bites of fascia-mesh-fascia, tied these knots down and approximated  this defect transversely.  This secured this.  The area was then injected  with some 0.5% Marcaine.  The wound was closed with 4-0 Vicryl with benzoin  and Steri-Strips.  The patient seemed to tolerate the procedure well.  She  will be given Darvocet-N 100 to take if needed for pain to be followed up in  the office in about four weeks.                                               Thornton Park Daphine Deutscher, M.D.    MBM/MEDQ  D:  07/05/2003  T:  07/06/2003  Job:  [id number]   cc:   [name]. Andrey Campanile, M.D.  [address]  [place]  [place] [address]  Fax: [phone]

## 2010-06-29 NOTE — Assessment & Plan Note (Signed)
[facility]                           GASTROENTEROLOGY OFFICE NOTE   NAME:Bergeron, KENSLIE ABBRUZZESE                MRN:          [id number]  DATE:12/03/2005                            DOB:          [birth year]    Caitland is a 55 year old white female with a history of eating disorder,  pancreas disease, and low grade pancreatitis, irritable bowel syndrome.  She  is on multiple psychotropic medications causing gastroparesis and decreased  GI motility causing abdominal pain and chronic constipation.  I have talked  to Dr. Madaline Guthrie about trying to cut back on some of her medications and she  is in the process of doing that but it has to be done very slowly because of  the patient's frail mental state.  Since we saw her last time for upper  endoscopy on October 22, 2005, the patient has continued to loose weight.  She brought her food chart today with her.  It appears that she drinks Boost  2-3 times a day with maximum intake of about 400 calories a day.  She has  lost about 10 pounds, still being overweight.   MEDICATIONS:  Listed on my last note.   PHYSICAL EXAMINATION:  VITAL SIGNS:  Blood pressure 108/70, pulse 64, weight  162 pounds.  GENERAL:  She had depressed affect, somewhat overweight.  SKIN:  Warm and dry.  LUNGS:  Clear to auscultation.  CARDIAC:  Normal S1, normal S2.  ABDOMEN:  Soft but tender in the left lower and left middle quadrant, also  on the left costal margin.  The right side of the abdomen was only minimally  uncomfortable.  Bowel sounds were normoactive to somewhat hypoactive.  No  distention.  RECTAL:  Not done today.   CT scan of the abdomen done recently showed known hemangioma of the right  lobe of the liver, another lesion 2.3 x 1.7-cm in the right lobe of the  liver not seen on the recent ultrasound.  Her pancreas appeared normal.  No  mesenteric adenopathy or inflammatory process.   IMPRESSION:  1. 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  2. Hemangioma of the liver with questionable new lesion which needs to be      followed up in 3 months by CT scan.  3. Polypharmacy.  4. Weight loss due to decreased appetite.   PLAN:  1. Gastric emptying scan.  2. The patient is to see Dr. Madaline Guthrie in an attempt to gradually decrease      some of her medications.  3. Repeat CT scan in mid January 2008.  4. Today liver function test and serum prealbumin which will help [place] to      assess her nutritional status.       Hedwig Morton. Juanda Chance, MD      DMB/MedQ  DD:  12/03/2005  DT:  12/04/2005  Job #:  [id number]   cc:   [name]. Andrey Campanile, M.D.  Phillip Heal, Dr.

## 2010-06-29 NOTE — Op Note (Signed)
NAME:  Becky [name], Becky [name]         ACCOUNT NO.:  [id number]   MEDICAL RECORD NO.:  [id number]          PATIENT TYPE:  AMB   LOCATION:  ENDO                         FACILITY:  [facility]   PHYSICIAN:  John C. Madilyn Fireman, M.D.    DATE OF BIRTH:  April 18, 1955   DATE OF PROCEDURE:  05/29/2006  DATE OF DISCHARGE:                               OPERATIVE REPORT   PROCEDURE:  Esophagogastroduodenoscopy with biopsy and Botox injection.   INDICATIONS:  Documented delayed gastric emptying with failure to  respond to Reglan and now consider surgical options to improve drainage  after consultation with the patient.  We attempted injection of the  pyloric sphincter to see if this aids in gastric emptying before  considering more invasive options.   PROCEDURE:  The patient was placed in the left lateral decubitus  position and placed on pulse monitor with continuous low-flow oxygen  delivered by nasal cannula.  She was sedated with 100 mcg IV fentanyl  and 8 mg IV Versed as well as 12.5 mg IV Phenergan.  Olympus video  endoscope was advanced under direct vision into the oropharynx and  esophagus.  Esophagus was straight with normal caliber. Squamocolumnar  line at 38 cm.  There was no visible hiatal hernia, ring stricture or  other abnormalities.  The GE junction and stomach was entered and a  small amount of liquid secretions were suctioned from the fundus.  Retroflexed view of cardia was unremarkable.  The fundus and body  appeared normal.  The antrum showed streaky and patchy erythema and  granularity and edema consistent with moderate antral gastritis and CLO-  test was obtained.  The pylorus appeared normal with no visible  stenosis.  No resistance to pass the scope through it.  The bulb and  second portion were well inspected and appeared be within normal limits.  Scope was withdrawn back to the antrum and botulinum toxin was injected,  25 units into each of four quadrants of pyloric sphincter. The  scope was  then withdrawn and the patient returned to the recovery room in stable  condition.  She tolerated the procedure well.  There were no immediate  complications.   IMPRESSION:  Gastritis; otherwise anatomically normal study, status post  injection of botulinum toxin into the pylorus as planned.   PLAN:  Will gradually advance diet assess clinically her response to  today's therapy.  Will followup in the office to 4 weeks.           ______________________________  Everardo All Madilyn Fireman, M.D.     JCH/MEDQ  D:  05/29/2006  T:  05/29/2006  Job:  [id number]   cc:   [name]. Andrey Campanile, M.D.  Thornton Park Daphine Deutscher, MD

## 2010-06-29 NOTE — Progress Notes (Signed)
  Subjective:    Patient ID: Becky [name], female    DOB: Sep 14, 1955, 55 y.o.   MRN: [id number]  HPI  Complaining of worsening rib pain. Had briefly discussed last time that she had had a fall. Rib pain seems to be increasing. Sharp located over the left rib breast bone junction. It hurts if she takes a deep breath. She's worried because it's not getting better quickly.  Review of Systems    no cough, no fever, no SOB Objective:   Physical Exam      LUNGS CTA B Ribs TTP left strernum / rib juncture   Assessment & Plan:  Rib pain Dicussed natural course of healing over new 2-3 weeks

## 2010-06-29 NOTE — Assessment & Plan Note (Signed)
[facility]                           GASTROENTEROLOGY OFFICE NOTE   NAME:Marohl, SUNNI RICHARDSON                MRN:          [id number]  DATE:10/21/2005                            DOB:          03-Jan-1956    Ms. Jarvie is a 55 year old white female with history of eating  disorder, pancreas divisum and low grade pancreatitis, and irritable bowel  syndrome.  She had a percutaneous gastrostomy placed in 1996 and removed in  1999.  She had laparoscopic cholecystectomy in September 2006.  She is here  today because of two problems.  One is recurrent left upper quadrant  abdominal pain suggestive of recurrent pancreatitis.  The other is diarrhea.  She has been under stress, being divorced now for one year.  She lives by  herself.  She is unable to eat very well and has been on 3-4 cans of Boost  as a dietary replacement on a daily basis.  She denies Boost being the cause  of her diarrhea.  Her weight has been quite overweight.  During her last  appointment in June 2006, she refused to be weighed so we are not really  sure how it compares with last year but I believe she has lost some weight,  being 172 pounds this year.   PHYSICAL EXAMINATION:  GENERAL APPEARANCE:  Patient appear slightly  depressed and pale.  VITAL SIGNS:  Blood pressure 122/64, weight 172 pounds.  LUNGS:  Clear to auscultation.  CARDIOVASCULAR:  Normal S1 and normal S1.  ABDOMEN:  Soft, very tender across the upper abdomen, most in the left upper  quadrant along the costal margin and laterally.  Right upper and lower  quadrants were normal.  Lower abdomen was normal.  There was no distention.  RECTAL:  Normal rectal tone with occult negative stool.   LABORATORY DATA:  Hemoglobin 11.5, hematocrit 34.3 with MCV of 84 on October 04, 2005, in Dr. Tawana Scale office.   IMPRESSION:  1. A 55 year old white female with history of pancreas divisum and suspect      low grade  pancreatitis.  2. Recurrent left upper quadrant abdominal pain, rule out irritable bowel      syndrome, rule out low grade pancreatitis.  3. History of Nissen fundoplication, gastroesophageal reflux, food      intolerance; rule out gastritis, gastric ulcer.  4. Last endoscopy was done in June 2006.  5. Diarrhea, may be due to irritable bowel syndrome versus dietary      supplements causing hyperosmotic diarrhea.  6. Status post laparoscopic cholecystectomy with possible choleretic      diarrhea.   PLAN:  1. Upper endoscopy scheduled for tomorrow.  2. Add Carafate 1 g p.o. b.i.d.  3. Robinul Forte 2 g b.i.d.  4. Check on iron studies and TIBC to see if she needs iron      supplementation.  5. Consider CT scan of the pancreas with pancreatic protocol.  Last CT      scan was done in 2003.  Hedwig Morton. Juanda Chance, MD   DMB/MedQ  DD:  10/21/2005  DT:  10/22/2005  Job #:  [id number]   cc:   [name]. Andrey Campanile, M.D.

## 2010-06-29 NOTE — Letter (Signed)
January 15, 2006    Becky [name]. Becky Deutscher, MD  [address]  [place], [place] [address]   RE:  Becky [name], Becky [name]  MRN:  [id number]  /  DOB:  Jun 24, 1955   Dear Susy Frizzle:   Becky [name] has an appointment to see you concerning possible  takedown of her  Nissen's fundoplication which was done in 2000.  You  know her from previous times when she had a laparoscopic cholecystectomy  in 2006, and Nissen's fundoplication in 2000 at the recommendation of  Becky [name] for treatment of intractable  gastroesophageal reflux  causing hoarseness, loss of voice.  I have been seeing Becky [name] for many  years, since 1990, when she had was evaluated for eating disorder,  chronic pancreatitis with pancreatic pseudocyst in 1995, pancreatic  adhesions.  She required PEG in 1990 for failure to thrive.  She has  severe psychological problems other than eating disorders, and has been  on multiple psychotropic medications causing severe gastroparesis and  decreased GI motility.   Attempts have been made to decrease some of her psychotropic  medications, but it is my understanding from her psychiatrist, Dr.  Madaline [name], that it will never be completely possible to take her off  medications.   She essentially lives on Boost and liquids because her stomach does not  empty at all.  Gastric emptying scan was done in the past, but the most  recent one on December 09, 2005, shows 92% of the ingested radial trace  remaining in the stomach after 2 hours.  Essentially, no emptying in 1  hour.  Becky [name] has a constant feeling of fullness and early satiety.  She has not been able to vomit.  She is on Nexium 40 mg twice a day.  I  am not sure, but it would be a consideration here to take her Nissen's  fundoplication down since she would immediately have severe  gastroesophageal reflux.  It is actually her preference to have a return  of her reflux, than to have the current status of being constantly  full  and having abdominal pain.  The patient had a CT scan of the abdomen  which was normal as far as her pancreas is concerned.  She has a  cavernous hemangioma in the liver which was previously evaluated.  She  has a small periumbilical right hernia which contains fat, and is not  clinically significant.   Last upper endoscopy was done on October 22, 2005, and confirmed  presence of retained food in the stomach.  Patient at that point was NPO  for 18 hours before the endoscopic procedure.  Her gastric outlet was  normal.   I would appreciate your opinion as to consideration of takedown of the  Nissen's fundoplication because of the severe gastroparesis and  essentially no chance that her psychotropic medications will be ever  reduced enough to return her GI motility.  I appreciate your opinion in  this matter.    Sincerely,      Hedwig Morton. Juanda Chance, MD  Electronically Signed    DMB/MedQ  DD: 01/15/2006  DT: 01/16/2006  Job #: [id number]   CC:    Becky [name]. Andrey Campanile, M.D.

## 2010-07-17 ENCOUNTER — Ambulatory Visit (INDEPENDENT_AMBULATORY_CARE_PROVIDER_SITE_OTHER): Payer: Medicare Other | Admitting: Family Medicine

## 2010-07-17 DIAGNOSIS — R7309 Other abnormal glucose: Secondary | ICD-10-CM

## 2010-07-17 NOTE — Patient Instructions (Signed)
-   CONTROL:  Be assertive in voicing what you want and need.  Recognize what you can and can't control.   - Food goals:    1. Consistency in your meals and snacks!  No skipping meals, and try to eat adequate amounts       at each meal.    2. Aim for 55 g protein per day.   3. Continue to record your FBG on food records.  If you have a very high or low level, look back       at previous day's eating AND write down if you were especially stressed at that time, or sick,       or especially sedentary.  Calculate your weekly FBG averages, and record on your food      record.

## 2010-07-17 NOTE — Progress Notes (Signed)
Medical Nutrition Therapy:  Appt start time: 1030 end time:  1130.  Assessment:  Primary concerns today: Elevated blood glucose (pre-DM) levels and anorexia nervosa. Joene has been feeling even more depressed recently.  Her dog has been staying with her ex-husband's dad, and she misses having him home.  She is also depressed about her weight, and she is affected by some of the family dynamics between her siblings.  Blaze is only having 45-min phone sessions every 2 months with her current therapist, which is probably not enough, but she is reluctant to start over with a therapist here in [place], even though she is encouraged to do so by some of her family.     Progress:  In Progress.    Nutritional Diagnosis:  NB-1.5 Disordered eating pattern as related to weight anxiety as evidenced by expressed wt concerns and apparent restriction.  Nadia saw her weight today on the AVS form, and was upset that it is so high.  She felt a bit better when told that it is down ~8 lb form a few months ago.     Intervention:  Nutrition counseling.     Monitoring/Evaluation:  Dietary intake, body weight, BG levels, and physical activity in 3 weeks.

## 2010-07-19 NOTE — Telephone Encounter (Signed)
[name], have you taken care of this?

## 2010-08-06 ENCOUNTER — Ambulatory Visit: Payer: Medicare Other | Admitting: Family Medicine

## 2010-08-08 ENCOUNTER — Encounter: Payer: Self-pay | Admitting: Family Medicine

## 2010-08-08 ENCOUNTER — Ambulatory Visit (INDEPENDENT_AMBULATORY_CARE_PROVIDER_SITE_OTHER): Payer: Medicare Other | Admitting: Family Medicine

## 2010-08-08 DIAGNOSIS — R7309 Other abnormal glucose: Secondary | ICD-10-CM

## 2010-08-08 NOTE — Patient Instructions (Signed)
-   Options for dealing with boredom:  Walk; gardening; drawing; take mom to appts.   - The 3 Ds of intercepting an inappropriate food choice:  1. Delay  2. Distract  3. Distance - Make sure you eat enough in the morning and at lunch time (consistently).   - On food and blood sugar record, if you miss a meal, write down why.  Also, be sure to write down    EVERYTHING you eat and drink.  - Reminder:  If FBG is very high or low, analyze (and write) why you think it is.   - Food goals:  55 g protein; CONSISTENCY in your intake (3 meals a day and adequate food).

## 2010-08-08 NOTE — Progress Notes (Signed)
Medical Nutrition Therapy:  Appt start time: 1100 end time:  1130.  Assessment:  Primary concerns today: Elevated blood glucose (pre-DM) levels and anorexia nervosa. Becky [name] kept daily food records, but did not record all snacks, i.e., crackers, which she said she may have been eating out of boredom.  She has been dog- and house-sitting at her sisters for 2 wks.  FBG have been averaging around 115, with a range of 99-142 (next-high 127).  She has not been walking much, and has played tennis once a week, but fell last week, bruising (or breaking) a rib, so will not be playing tennis for at least a couple of weeks.  Food records show a kcal intake ranging from 420 (on 2 days) to ~1100.  Protein intake has averaged ~53 g/day.    Progress:  In Progress.    Nutritional Diagnosis:  NB-1.5 Disordered eating pattern as related to weight anxiety as evidenced by expressed wt concerns and apparent restriction.      Intervention:  Nutrition counseling.     Monitoring/Evaluation:  Dietary intake, body weight, BG levels, and physical activity in 3 weeks.

## 2010-08-27 ENCOUNTER — Ambulatory Visit (INDEPENDENT_AMBULATORY_CARE_PROVIDER_SITE_OTHER): Payer: Medicare Other | Admitting: Family Medicine

## 2010-08-27 ENCOUNTER — Encounter: Payer: Self-pay | Admitting: Family Medicine

## 2010-08-27 DIAGNOSIS — R7309 Other abnormal glucose: Secondary | ICD-10-CM

## 2010-08-27 NOTE — Patient Instructions (Addendum)
-   Walking:  Limit to 15 minutes at a time until your ribs feel better.  - Important principles of weight management:    1. Consistent meal times and quantities.  2. Adequate protein daily (and at each meal).   3. Plenty of veg's (@ both lunch and dinner).   4. Eating enough times during the day (never more than 5 hours between eating).  5. Physical activity not just several times a week, but throughout the day as well.   - If you have a FBG below 90, be sure to have some carb food (15-30 g), and record on your log why it's likely to be low.   - Call late this week for Aug appt (11:30, Aug 6):  [phone].

## 2010-08-27 NOTE — Progress Notes (Signed)
Medical Nutrition Therapy:  Appt start time: 1100 end time:  1130.  Assessment:  Primary concerns today: Elevated blood glucose (pre-DM) levels and anorexia nervosa. Bekah tried walking for an hour last week, but her broken ribs started hurting worse after about 15 min.  Her knees are fine right now; however, her upper R abdominal quadrant is still painful.  Dr. Ludwig Clarks suspects that it is related to delayed stomach emptying.  Guida is getting botox tx for her pyloric sphincter on July 31; if pain disappears, that will confirm correct dx for upper R.   Weekly averages of fasting blood sugars have been running 107, 115, and 103; in 3 wks, she had only 2 under 95, including 85 today.  Chelcey is not sure if she feels symptomatic at this BG level, as she has been feeling tired a lot lately anyway, as well as having frequent headaches.  She has had several days in the last 3 wks when she slept through dinner, and while she usually gets 9-10 hrs of sleep/night, a couple days ago she slept from 10 PM till 4 PM.    Progress:  In Progress.    Nutritional Diagnosis:  NB-1.5 Disordered eating pattern as related to weight anxiety as evidenced by expressed wt concerns and apparent restriction.      Intervention:  Nutrition counseling.     Monitoring/Evaluation:  Dietary intake, body weight, BG levels, and physical activity in 3 weeks.

## 2010-09-11 ENCOUNTER — Ambulatory Visit (HOSPITAL_COMMUNITY)
Admission: RE | Admit: 2010-09-11 | Discharge: 2010-09-11 | Disposition: A | Discharge status: Home / Self Care | Payer: Medicare Other | Source: Ambulatory Visit | Attending: Gastroenterology | Admitting: Gastroenterology

## 2010-09-11 DIAGNOSIS — R112 Nausea with vomiting, unspecified: Secondary | ICD-10-CM | POA: Insufficient documentation

## 2010-09-11 DIAGNOSIS — R6881 Early satiety: Secondary | ICD-10-CM | POA: Insufficient documentation

## 2010-09-11 DIAGNOSIS — K2970 Gastritis, unspecified, without bleeding: Secondary | ICD-10-CM | POA: Insufficient documentation

## 2010-09-11 DIAGNOSIS — K2990 Gastroduodenitis, unspecified, without bleeding: Secondary | ICD-10-CM | POA: Insufficient documentation

## 2010-09-11 DIAGNOSIS — K3184 Gastroparesis: Secondary | ICD-10-CM | POA: Insufficient documentation

## 2010-09-17 ENCOUNTER — Ambulatory Visit (INDEPENDENT_AMBULATORY_CARE_PROVIDER_SITE_OTHER): Payer: Medicare Other | Admitting: Family Medicine

## 2010-09-17 DIAGNOSIS — F509 Eating disorder, unspecified: Secondary | ICD-10-CM

## 2010-09-17 DIAGNOSIS — R7309 Other abnormal glucose: Secondary | ICD-10-CM

## 2010-09-17 NOTE — Progress Notes (Signed)
Medical Nutrition Therapy:  Appt start time: 1030 end time:  1100.  Assessment:  Primary concerns today: Elevated blood glucose (pre-DM) levels and anorexia nervosa. [name] started taking her seroquel after dinner recently, instead of at bedtime, which has helped with next-day drowsiness.  Although ribs and knees have been feeling better, she has not exercised much.  [name] ran out of strips, so has not been testing her BG. The pharmacy told her the refill will cost $77.  Average protein intake has been 38 g/day for past 3 wks.   She is very distressed that her family wants her to get rid of her dog, and get a new smaller dog.  As in past times, [name] is somewhat paralyzed when dealing with conflict, and this has manifested as not taking care of herself as she needs to do, i.e., adequate diet and exercise.    Progress:  In Progress.    Nutritional Diagnosis:  NB-1.5 Disordered eating pattern as related to weight anxiety as evidenced by expressed wt concerns and apparent restriction.      Intervention:  Nutrition counseling.     Monitoring/Evaluation:  Dietary intake, body weight, BG levels, and physical activity in 3 weeks.

## 2010-09-17 NOTE — Patient Instructions (Addendum)
-   Talk to your pharmacy re. test strips TODAY.   - Explore possibility of gym membership.   - Reminder:  Important principles of weight management:  1. Consistent meal times and quantities.  2. Adequate protein daily (and at each meal). THIS IS ESPECIALLY IMPORTANT AT BREAKFAST.   3. Plenty of veg's (@ both lunch and dinner).  4. Eating enough times during the day (never more than 5 hours between eating).  5. Physical activity not just several times a week, but throughout the day as well.   GOAL:  30 min walking 3 X wk + tennis once a week.   - If you have a FBG below 90, be sure to have some carb food (15-30 g), and record on your log why it's likely to be low.

## 2010-10-08 ENCOUNTER — Encounter: Payer: Self-pay | Admitting: Family Medicine

## 2010-10-08 ENCOUNTER — Ambulatory Visit (INDEPENDENT_AMBULATORY_CARE_PROVIDER_SITE_OTHER): Payer: Medicare Other | Admitting: Family Medicine

## 2010-10-08 DIAGNOSIS — R7309 Other abnormal glucose: Secondary | ICD-10-CM

## 2010-10-08 NOTE — Patient Instructions (Signed)
1. Be sure to get a substantial amount of protein at breakfast, i.e., at least 1 full cup of milk/yogurt or eggs.   2. Plan ahead to be sure you have veg's on hand; carrots or at least a piece of fruit for bag lunches.   3. Goal:  Protein intake of 55 grams.   4. Exercise goal:  Walk at least 30 min 3 X wk (as tolerated by your knee).  Continue to move as much as you can through the day.   5. Call to inquire re. gym membership.

## 2010-10-08 NOTE — Progress Notes (Signed)
Medical Nutrition Therapy:  Appt start time: 1030 end time:  1100.  Assessment:  Primary concerns today: Elevated blood glucose (pre-DM) levels and anorexia nervosa. Becky [name] again did not meet her protein intake goals, averaging <40, although she ate more consistently in terms of not missing meals, and getting veg's.  She has not been motivated to walk, although her knee was feeling ok prior to playing tennis last Friday.  (Now it hurts again.)   Nor has Becky [name] called to ask re. a gym membership, which would offer her some alternatives in light of her knee pain.  Becky [name] does feel she's done better with moving more through the day.  FBG has been averaging ~100, w/ highest 180, 156, & 123.    Progress:  In Progress.    Nutritional Diagnosis:  NB-1.5 Disordered eating pattern as related to weight anxiety as evidenced by expressed wt concerns and apparent restriction.      Intervention:  Nutrition counseling.     Monitoring/Evaluation:  Dietary intake, body weight, BG levels, and physical activity in 3 weeks.

## 2010-10-29 ENCOUNTER — Ambulatory Visit: Payer: Medicare Other | Admitting: Family Medicine

## 2010-10-29 ENCOUNTER — Ambulatory Visit (INDEPENDENT_AMBULATORY_CARE_PROVIDER_SITE_OTHER): Payer: Medicare Other | Admitting: Family Medicine

## 2010-10-29 DIAGNOSIS — R7309 Other abnormal glucose: Secondary | ICD-10-CM

## 2010-10-29 NOTE — Patient Instructions (Signed)
-   When we are not physically active, it makes [place] feel even more lethargic than when we are active.   - Exercise will also help you to feel less depressed.   - What can reduce your dependency on Santina Evans and [place] for company?  - Walking (having some specific goals, i.e., distance or time per day walked.    - Spend time with neighbor Einar Pheasant.   - Friday afternoon group of 12 steppers:  Consider having this at your house!  - Drawing (music in background).   - Projects:  Christmas, Engineer, maintenance (IT), garden - Physical activity goal:  Walk at least 30 min 3 X wk to start, working up to at least 4 X wk.    Recommendation:  Walk right after breakfast.  - Continue food goals of 55 g protein per day, 3 meals a day, and veg's 2 X day.    - PULL OUT THIS TO-DO LIST, AND REVIEW SEVERAL TIMES A WEEK, OR IF YOU ARE BORED!

## 2010-10-29 NOTE — Progress Notes (Signed)
Medical Nutrition Therapy:  Appt start time: 1030 end time:  1100.  Assessment:  Primary concerns today: Elevated blood glucose (pre-DM) levels and anorexia nervosa. Rhyanna has had continued knee pain, but has been playing tennis (doubles) once a week, and she walked 30 min only once in the past 3 wks.  Protein intake averaged only 46 g/day.  She has been feeling depressed lately mostly b/c of feeling lonely as her sister has been working more than usual, and her dog has been staying with her ex-father-in-law again.    Progress:  In Progress.    Nutritional Diagnosis:  NB-1.5 Disordered eating pattern as related to weight anxiety as evidenced by expressed wt concerns and apparent restriction.      Intervention:  Nutrition counseling.     Monitoring/Evaluation:  Dietary intake, body weight, BG levels, and physical activity in 3 weeks.

## 2010-11-04 NOTE — Op Note (Signed)
  NAMEMAELEE, Becky [name]         ACCOUNT NO.:  [id number]  MEDICAL RECORD NO.:  [id number]  LOCATION:  [facility]                         FACILITY:  [facility]  PHYSICIAN:  Cypress Hinkson C. Madilyn Fireman, M.D.    DATE OF BIRTH:  Oct 06, 1955  DATE OF PROCEDURE:  09/11/2010 DATE OF DISCHARGE:                              OPERATIVE REPORT   PROCEDURE PERFORMED:  Esophagogastroduodenoscopy with Botox injection.  INDICATIONS FOR PROCEDURE:  Patient with medically refractory gastroparesis, who has had worsening nausea, vomiting and early satiety. She has responded well to Botox injections of the pylorus in the past and requests another due to recurrent symptoms.  It was last performed approximately 1 year ago.  DESCRIPTION OF PROCEDURE:  The patient was placed in the left lateral decubitus position and placed on the pulse monitor with continuous low- flow oxygen delivered by nasal cannula.  She was sedated with 70 mcg IV fentanyl and 7 mg IV Versed.  The Olympus video endoscope was advanced under direct vision into the oropharynx and esophagus.  The esophagus was straight and of normal caliber with squamocolumnar line at 38 cm. There was no visible esophagitis ring, stricture or other abnormality of the GE junction.  The stomach was entered and a small amount of liquid secretions were suctioned from the fundus.  Retroflexed view of cardia appeared to reveal evidence of previous Nissen fundoplication and there was a small suture visible within the wrap.  Otherwise the fundus and body appeared normal.  The antrum showed a few elevated areas of focal erythema consistent with mild gastritis with no definite erosions or ulcers.  The pylorus appeared normal and non-deformed and easily allowed passage of the endoscope tip into the duodenum.  Both bulb and second portion were inspected and appeared to be within normal limits.  The scope was withdrawn back into the prepyloric area and 4 aliquots of 25 units of Botox  were injected in four quadrants into the pyloric sphincter.  A small amount of the first injection appeared to extravasate, but the remainder of it appeared to be delivered intramuscularly as intended.  The scope was then withdrawn and the patient returned to the recovery room in stable condition.  She tolerated the procedure well and there were no immediate complications.  IMPRESSION: 1. Evidence of previous Nissen fundoplication. 2. Mild gastritis. 3. No anatomic abnormality of the pylorus and no retained food in the     stomach, status post empiric injection of Botox for gastroparesis.  PLAN:  Advance diet and observe response to therapy.         ______________________________ Everardo All. Madilyn Fireman, M.D.    [facility]/MEDQ  D:  09/11/2010  T:  09/11/2010  Job:  [id number]  Electronically Signed by Dorena Cookey M.D. on 11/04/2010 11:38:06 AM

## 2010-11-07 ENCOUNTER — Other Ambulatory Visit: Payer: Self-pay | Admitting: Family Medicine

## 2010-11-15 ENCOUNTER — Other Ambulatory Visit: Payer: Self-pay | Admitting: Family Medicine

## 2010-11-19 ENCOUNTER — Ambulatory Visit (INDEPENDENT_AMBULATORY_CARE_PROVIDER_SITE_OTHER): Payer: Medicare Other | Admitting: Family Medicine

## 2010-11-19 ENCOUNTER — Encounter: Payer: Self-pay | Admitting: Family Medicine

## 2010-11-19 ENCOUNTER — Other Ambulatory Visit: Payer: Self-pay | Admitting: *Deleted

## 2010-11-19 DIAGNOSIS — R7309 Other abnormal glucose: Secondary | ICD-10-CM

## 2010-11-19 MED ORDER — TRAMADOL HCL 50 MG PO TABS: ORAL_TABLET | ORAL | Status: DC

## 2010-11-19 NOTE — Patient Instructions (Signed)
-   Get on the phone and find out which of the recommended therapists will take your insurance.   - When you go to [facility] this weekend, bring some food you are comfortable with.  And be flexible with the foods provided at lunch and dinner Saturday.  Aim for getting some vegetables (&/or fruit), and get some PROTEIN.   - Look for some protein powder mix you can blend into soy milk.  Look at [facility] or [employer].  (Whey protein is a good quality source.)  One cup soy milk + protein powder will provide at least 27 g protein, as much as 35 g.   - If you want to lose weight, you HAVE TO eat!   - Food goals:  55 g protein/day; veg's 2 X day; consistent 3 meals/day.   - Physical activity goals:  At least 30 min walk 3 wk; tennis weekly.  (EMAIL YOUR TENNIS PRO AGAIN TO ASK THAT HE NOT PAIR YOU WITH THE PARTICULAR PLAYER.)
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
## 2010-12-10 ENCOUNTER — Ambulatory Visit (INDEPENDENT_AMBULATORY_CARE_PROVIDER_SITE_OTHER): Payer: Medicare Other | Admitting: Family Medicine

## 2010-12-10 DIAGNOSIS — R7309 Other abnormal glucose: Secondary | ICD-10-CM

## 2010-12-10 NOTE — Patient Instructions (Addendum)
-   Switch back to soy milk instead of almond milk for a protein source.   - Three meals a day, even if they are small.  Remember, your last food record averaged 42 grams of protein per day, and kcal intake ranged from 380 to 990.   - Principles of weight management:  - CONSISTENT eating schedule, getting 3 meals and 1-2 snacks per day.    - Protein at each meal.   - LOTS of fruits and veg's, especially veg's.    - Consistent, regular exercise.  - Work especially hard this week in getting consistent meal times, and to include your 55 grams of protein, as well as plenty of veg's.

## 2010-12-10 NOTE — Progress Notes (Signed)
Medical Nutrition Therapy:  Appt start time: 1030 end time:  1100.  Assessment:  Primary concerns today: Elevated blood glucose (pre-DM) levels and anorexia nervosa. Becky [name] has had severe respiratory problems since Oct 12.  She has been using codeine cough syrup, antibiotic (finished yesterday), and simvicort inhaler, and she got a prednisone shot last week for the coughing.  Consequently, she has not been able to exercise, and she has skipped a lot of meals, and has been eating a lot of soup, given her by her neighbor and parents.  Fasting blood sugars have been lower b/c Becky [name] hasn't been eating much, ranging from 72-152, with most 90s or low-100s.  She did not keep food records for all meals, a consequence of feeling so bad.    Progress:  In Progress.    Nutritional Diagnosis:  NB-1.5 Disordered eating pattern as related to weight anxiety as evidenced by expressed wt concerns and apparent restriction.      Intervention:  Nutrition counseling.     Monitoring/Evaluation:  Dietary intake, body weight, and physical activity in 3 weeks.

## 2010-12-31 ENCOUNTER — Ambulatory Visit: Payer: Medicare Other | Admitting: Family Medicine

## 2011-01-15 ENCOUNTER — Other Ambulatory Visit: Payer: Medicare Other

## 2011-01-15 ENCOUNTER — Encounter: Payer: Self-pay | Admitting: Critical Care Medicine

## 2011-01-15 ENCOUNTER — Ambulatory Visit (INDEPENDENT_AMBULATORY_CARE_PROVIDER_SITE_OTHER): Payer: Medicare Other | Admitting: Critical Care Medicine

## 2011-01-15 ENCOUNTER — Ambulatory Visit (INDEPENDENT_AMBULATORY_CARE_PROVIDER_SITE_OTHER)
Admission: RE | Admit: 2011-01-15 | Discharge: 2011-01-15 | Disposition: A | Discharge status: Home / Self Care | Payer: Medicare Other | Source: Ambulatory Visit | Attending: Critical Care Medicine | Admitting: Critical Care Medicine

## 2011-01-15 DIAGNOSIS — J45909 Unspecified asthma, uncomplicated: Secondary | ICD-10-CM

## 2011-01-15 DIAGNOSIS — J4540 Moderate persistent asthma, uncomplicated: Secondary | ICD-10-CM | POA: Insufficient documentation

## 2011-01-15 DIAGNOSIS — J329 Chronic sinusitis, unspecified: Secondary | ICD-10-CM | POA: Insufficient documentation

## 2011-01-15 HISTORY — DX: Moderate persistent asthma, uncomplicated: J45.40

## 2011-01-15 NOTE — Progress Notes (Signed)
Subjective:    Patient ID: Becky [name], female    DOB: Nov 08, 1955, 55 y.o.   MRN: [id number]  HPI Comments: Dx Asthma 10/12.   Gene [place] previously saw the pt and dx asthma as well.  Allergy testing was done, ragweed etc.  Asthma She complains of chest tightness, cough, difficulty breathing, hoarse voice, shortness of breath, sputum production and wheezing. There is no frequent throat clearing or hemoptysis. Primary symptoms comments: Chronic cough, occ prod yellow. This is a chronic problem. The current episode started more than 1 month ago. The problem occurs every several days. The problem has been unchanged. The cough is productive of sputum, productive, croupy, hacking, dry, hoarse and paroxysmal. Associated symptoms include appetite change, dyspnea on exertion, headaches, malaise/fatigue, nasal congestion, PND, postnasal drip, rhinorrhea and a sore throat. Pertinent negatives include no chest pain, ear congestion, ear pain, fever, heartburn, orthopnea, sneezing, sweats, trouble swallowing or weight loss. Her symptoms are aggravated by any activity, exercise, change in weather, URI and strenuous activity. Her symptoms are alleviated by beta-agonist and steroid inhaler. She reports moderate improvement on treatment. Her past medical history is significant for asthma. There is no history of bronchiectasis, bronchitis, COPD, emphysema or pneumonia.      Review of Systems  Constitutional: Positive for malaise/fatigue and appetite change. Negative for fever, chills, weight loss and unexpected weight change.  HENT: Positive for sore throat, hoarse voice, rhinorrhea and postnasal drip. Negative for ear pain, nosebleeds, congestion, sneezing, trouble swallowing, dental problem, voice change and sinus pressure.   Eyes: Negative for visual disturbance.  Respiratory: Positive for cough, sputum production, chest tightness, shortness of breath and wheezing. Negative for hemoptysis and choking.    Cardiovascular: Positive for dyspnea on exertion and PND. Negative for chest pain and leg swelling.  Gastrointestinal: Positive for abdominal pain. Negative for heartburn, vomiting and diarrhea.  Genitourinary: Negative for difficulty urinating.  Musculoskeletal: Negative for arthralgias.  Skin: Negative for rash.  Neurological: Positive for headaches. Negative for tremors and syncope.  Hematological: Does not bruise/bleed easily.       Objective:   Physical Exam Filed Vitals:   01/15/11 1536  BP: 122/72  Pulse: 82  Temp: 98.2 F (36.8 C)  TempSrc: Oral  Height: 5' 0.5" (1.537 m)  Weight: 177 lb 6.4 oz (80.468 kg)  SpO2: 98%    Gen: Pleasant, well-nourished, in no distress,  normal affect  ENT: No lesions,  mouth clear,  oropharynx clear, no postnasal drip  Neck: No JVD, no TMG, no carotid bruits  Lungs: No use of accessory muscles, no dullness to percussion, exp wheezes  Cardiovascular: RRR, heart sounds normal, no murmur or gallops, no peripheral edema  Abdomen: soft and NT, no HSM,  BS normal  Musculoskeletal: No deformities, no cyanosis or clubbing  Neuro: alert, non focal  Skin: Warm, no lesions or rashes  Dg Chest 2 View  01/15/2011  *RADIOLOGY REPORT*  Clinical Data: Asthma exacerbation  CHEST - 2 VIEW  Comparison: 07/01/2003  Findings: Heart size is normal.  There is no pleural effusion or pulmonary edema.  No airspace consolidation identified.  There are coarsened interstitial markings noted bilaterally consistent with COPD.  Review of the visualized osseous structures is unremarkable.  IMPRESSION:  1.  No active cardiopulmonary abnormalities.  Original Report Authenticated By: Rosealee Albee, M.D.    12/4 Cleda Daub: Normal      Assessment & Plan:   Extrinsic asthma, unspecified Suspected moderate persistent asthma with reactive airways disease as a  cause of cyclical cough and dyspnea. Chronic sinusitis may also be playing a role. Plan Stay on  Symbicort two puff twice daily Use albuterol as needed No other medication changes advised at this time Return 2 months     Updated Medication List Outpatient Encounter Prescriptions as of 01/15/2011  Medication Sig Dispense Refill  . albuterol (PROAIR HFA) 108 (90 BASE) MCG/ACT inhaler Inhale 2 puffs into the lungs every 6 (six) hours as needed.        Marland Kitchen albuterol (PROVENTIL) (2.5 MG/3ML) 0.083% nebulizer solution Take 2.5 mg by nebulization every 6 (six) hours as needed.        . benztropine (COGENTIN) 0.5 MG tablet Take 0.5 mg by mouth at bedtime.        . budesonide-formoterol (SYMBICORT) 160-4.5 MCG/ACT inhaler Inhale 2 puffs into the lungs 2 (two) times daily.        Marland Kitchen buPROPion (WELLBUTRIN XL) 150 MG 24 hr tablet 3 tablets daily      . cetirizine (ZYRTEC) 10 MG tablet Take 10 mg by mouth every morning.        . Cholecalciferol 50000 UNIT capsule Take 50,000 Units by mouth as directed. Take 1 every other week      . clonazePAM (KLONOPIN) 1 MG tablet Take 1 mg by mouth 3 (three) times daily. 1 three times daily and 2 at bedtime      . dicyclomine (BENTYL) 20 MG tablet Take 20 mg by mouth every 6 (six) hours.        . DULoxetine (CYMBALTA) 30 MG capsule Take 30 mg by mouth daily. Take 1 pill AM with 2 x 60mg  Cymbalta for total of 150mg        . DULoxetine (CYMBALTA) 60 MG capsule 2 every am      . eszopiclone (LUNESTA) 2 MG TABS Take 2 mg by mouth at bedtime. Take immediately before bedtime       . fluticasone (FLONASE) 50 MCG/ACT nasal spray 2 sprays daily.        Marland Kitchen gabapentin (NEURONTIN) 300 MG capsule Take 300 mg by mouth 3 (three) times daily.        Marland Kitchen ibuprofen (ADVIL,MOTRIN) 600 MG tablet Take 600 mg by mouth 3 (three) times daily.        Marland Kitchen L-Methylfolate (DEPLIN) 15 MG TABS Take 1 tablet by mouth daily.       Marland Kitchen lidocaine (LIDODERM) 5 % Place 1 patch onto the skin daily. Remove & Discard patch within 12 hours or as directed by MD       . Multiple Vitamins-Minerals ([facility] DAILY  MULTIPLE WOMEN 50+ PO) Take by mouth daily.        Marland Kitchen omeprazole (PRILOSEC) 20 MG capsule Take 20 mg by mouth daily.        . QUEtiapine (SEROQUEL XR) 200 MG 24 hr tablet Take 150 mg by mouth daily after supper.       . traMADol (ULTRAM) 50 MG tablet Take 1-2 two times a day as needed for knee pain  60 tablet  1  . traZODone (DESYREL) 100 MG tablet Take 150 mg by mouth at bedtime. Takes 1/3 of 150 mg pill at bedtime.      . ziprasidone (GEODON) 20 MG capsule Take 20 mg by mouth daily after breakfast.        . ziprasidone (GEODON) 40 MG capsule Take 40 mg by mouth 2 (two) times daily with a meal. Taking 40 at dinner and 80 at bedtime.      Marland Kitchen  ziprasidone (GEODON) 80 MG capsule Take 80 mg by mouth at bedtime.        Marland Kitchen DISCONTD: clonazePAM (KLONOPIN) 2 MG tablet Take 2 mg by mouth 2 (two) times daily. 2 at bedtime

## 2011-01-15 NOTE — Progress Notes (Signed)
Quick Note:  Notify the patient that the Xray is stable and no pneumonia No change in medications are recommended. Continue current meds as prescribed at last office visit ______ 

## 2011-01-15 NOTE — Patient Instructions (Signed)
Chest xray today LAbs to check allergies today We will call with lab/xray results Stay on Symbicort two puff twice daily Use albuterol as needed No other medication changes advised at this time Return 2 months

## 2011-01-16 LAB — ALLERGY FULL PROFILE
Alternaria Alternata: <0.1 kU/L (ref <0.35)
Aspergillus fumigatus, IgG: <0.1 kU/L (ref <0.35)
Bahia Grass: <0.1 kU/L (ref <0.35)
Bermuda Grass: <0.1 kU/L (ref <0.35)
Cat Dander: <0.1 kU/L (ref <0.35)
Curvularia lunata: <0.1 kU/L (ref <0.35)
D. farinae: <0.1 kU/L (ref <0.35)
Dog Dander: <0.1 kU/L (ref <0.35)
Elm IgE: <0.1 kU/L (ref <0.35)
Fescue: <0.1 kU/L (ref <0.35)
G009 Red Top: <0.1 kU/L (ref <0.35)
Goldenrod: <0.1 kU/L (ref <0.35)
Helminthosporium halodes: <0.1 kU/L (ref <0.35)
Lamb's Quarters: <0.1 kU/L (ref <0.35)
Plantain: <0.1 kU/L (ref <0.35)
Sycamore Tree: <0.1 kU/L (ref <0.35)

## 2011-01-16 NOTE — Progress Notes (Signed)
Quick Note:  Call pt and tell her labs are ok, No change in medications There are no allergies seen in the blood test ______

## 2011-01-16 NOTE — Assessment & Plan Note (Signed)
Suspected moderate persistent asthma with reactive airways disease as a cause of cyclical cough and dyspnea. Chronic sinusitis may also be playing a role. Plan Stay on Symbicort two puff twice daily Use albuterol as needed No other medication changes advised at this time Return 2 months

## 2011-01-21 ENCOUNTER — Ambulatory Visit: Payer: Medicare Other | Admitting: Family Medicine

## 2011-01-31 ENCOUNTER — Ambulatory Visit: Payer: Medicare Other | Admitting: Family Medicine

## 2011-01-31 ENCOUNTER — Encounter: Payer: Self-pay | Admitting: Family Medicine

## 2011-01-31 ENCOUNTER — Ambulatory Visit (INDEPENDENT_AMBULATORY_CARE_PROVIDER_SITE_OTHER): Payer: Medicare Other | Admitting: Family Medicine

## 2011-01-31 DIAGNOSIS — F509 Eating disorder, unspecified: Secondary | ICD-10-CM

## 2011-01-31 NOTE — Progress Notes (Signed)
Medical Nutrition Therapy:  Appt start time: 1030 end time:  1100.  Assessment:  Primary concerns today: Elevated blood glucose (pre-DM) levels and anorexia nervosa. Daryan has been diagnosed with asthma, and she is seeing pulmonologist Dr. Delford Field.  She is doing better, but is still coughing and has breathing problems at times.  Knee pain continues, especially the left one.  She has had to discontinue tennis d/t the asthma, but she has walked a little recently, ~15 min daily.  Food choices have not been good; Niyanna has missed a lot of meals, sometimes eating next to nothing in a day.  She feels this has been related to depression, which is consistently worse during winter months.  She has felt worse recently b/c of "lending" her dog to her ex-husband's father for company.  We talked today about the need for Jazman to determine activities she can do that will get her out more, and among people, which she knows is helpful in making her feel better.  Average intake in the past week was 469 kcal, with a range of 240 to 940.  Taytum ate three meals on two days only.    Progress:  In Progress.    Nutritional Diagnosis:  NB-1.5 Disordered eating pattern as related to weight anxiety as evidenced by expressed wt concerns and apparent restriction.      Intervention:  Nutrition counseling.     Monitoring/Evaluation:  Dietary intake, body weight, and physical activity in 3 weeks.

## 2011-01-31 NOTE — Patient Instructions (Addendum)
-   The next time you see Dr. Ludwig Clarks, ask about getting your vit D checked again after supplementing with the high dosage.  - When you see Dr. Madaline Guthrie next, talk to her about the possibility of a full-spectrum light box.  - Consistent eating times and getting adequate energy each day is important for helping improve mood/depression.  Remember that your brain needs good nutrition and exercise as much as the rest of your body does.    - GOAL:  Eat three meals AND at least one snack a day. - What you can do to help make it easier to eat:  1. Tell [name] that Becky [name] needs to be back home with you by Dec 30th.    2. Get out more.    3. Initiate something social with your neighbors.    4. Plan something to do every day next week.     5. Grocery shop at the first of the month, so there are foods on hand.   6. Set an alert on your phone to remind you that it's meal time (for lunch and dinner).

## 2011-02-25 ENCOUNTER — Ambulatory Visit (INDEPENDENT_AMBULATORY_CARE_PROVIDER_SITE_OTHER): Payer: Medicare Other | Admitting: Family Medicine

## 2011-02-25 VITALS — BP 140/90

## 2011-02-25 DIAGNOSIS — M1710 Unilateral primary osteoarthritis, unspecified knee: Secondary | ICD-10-CM

## 2011-02-25 DIAGNOSIS — IMO0002 Reserved for concepts with insufficient information to code with codable children: Secondary | ICD-10-CM

## 2011-02-26 NOTE — Progress Notes (Signed)
  Subjective:    Patient ID: Becky [name], female    DOB: February 08, 1956, 57 y.o.   MRN: [id number]  HPI Bilateral knee pain worse over the last 6 weeks. Last had injections 8 months ago and they were significantly helpful alleviating her pain 75-90%. Now assigned to have the aching pain and increase in stiffness. No new injury.   Review of Systems    denies unusual weight gain or loss. Denies fever. Has not seen redness or noticed warmth of the knees. They feel somewhat stiff but she's noted no overt swelling. Objective:   Physical Exam  Vital signs reviewed. GENERAL: Well developed, well nourished, no acute distress KNEES: Full extension and flexion. Mucosa line tenderness bilaterally right greater than left. Ligamentously intact to varus and with stress. Negative McMurray bilaterally.  INJECTION: Patient was given informed consent, signed copy in the chart. Appropriate time out was taken. Area prepped and draped in usual sterile fashion. One cc of methylprednisolone 40 mg/ml plus  4 cc of 1% lidocaine without epinephrine was injected into the bilateral knee using a(n) anterior approach. The patient tolerated the procedure well. There were no complications. Post procedure instructions were given.       Assessment & Plan:  #1. Moderate ulcer arthritis knees. She has significant improvement with steroid injections. I would continue space these out as long in between injections as possible. She will followup when necessary

## 2011-02-28 ENCOUNTER — Ambulatory Visit: Payer: Medicare Other | Admitting: Family Medicine

## 2011-03-07 ENCOUNTER — Ambulatory Visit (INDEPENDENT_AMBULATORY_CARE_PROVIDER_SITE_OTHER): Payer: Medicare Other | Admitting: Family Medicine

## 2011-03-07 ENCOUNTER — Encounter: Payer: Self-pay | Admitting: Family Medicine

## 2011-03-07 VITALS — Ht 60.25 in | Wt 173.3 lb

## 2011-03-07 DIAGNOSIS — F509 Eating disorder, unspecified: Secondary | ICD-10-CM

## 2011-03-07 NOTE — Patient Instructions (Addendum)
-   When you shop next, think about getting some fresh or frozen veg's to make some homemade soup you can freeze.  - FOOD GOALS:    1. Eat at least 3 meals and 1-2 snacks per day.  Aim for no more than 5 hours between eating.  2. Include some protein and veg's with both lunch and dinner.    3. Include some protein with breakfast. - Social goal:  Schedule at least one social activity before your next nutrition appt.

## 2011-03-07 NOTE — Progress Notes (Signed)
Medical Nutrition Therapy:  Appt start time: 1030 end time:  1100.  Assessment:  Primary concerns today: Elevated blood glucose (pre-DM) levels and anorexia nervosa. Becky [name] went to [employer] last week to help her niece, who is pregnant with her second child, by babysitting the almost-2-YO.  She is now helping out an elderly woman who had surgery recently.  She has had some severe panic attacks, in which she has needed to call her mom.  Once she took two gabapentins, and went to sleep.  She continues to have problems with breathing.  Becky [name] has foods at home now, with plans to get more in the next few days.  Food records indicate that Becky [name] was still eating one to two meals/day until the past couple of weeks.  With three meals/day, she still is averaging only 480 kcal/day (range 340-580 in the past week), and she had one day in Dec when she ate only 140 kcal.  She sees Dr. Ludwig Clarks tomorrow and Dr. Madaline Guthrie today, so will follow up with Qs discussed at last nutr appt.  Of significance, Becky [name] is going to switch therapists next month; will be seeing Harle Stanford on Feb 4th.   Progress:  In Progress.    Nutritional Diagnosis:  NB-1.5 Disordered eating pattern as related to weight anxiety as evidenced by expressed wt concerns and apparent restriction.      Intervention:  Nutrition counseling.     Monitoring/Evaluation:  Dietary intake, body weight, and physical activity in 3 weeks.

## 2011-03-25 ENCOUNTER — Ambulatory Visit: Payer: Medicare Other | Admitting: Family Medicine

## 2011-03-26 ENCOUNTER — Ambulatory Visit: Payer: Medicare Other | Admitting: Critical Care Medicine

## 2011-04-10 ENCOUNTER — Encounter: Payer: Self-pay | Admitting: Critical Care Medicine

## 2011-04-10 ENCOUNTER — Ambulatory Visit (INDEPENDENT_AMBULATORY_CARE_PROVIDER_SITE_OTHER): Payer: Medicare Other | Admitting: Critical Care Medicine

## 2011-04-10 DIAGNOSIS — J45909 Unspecified asthma, uncomplicated: Secondary | ICD-10-CM

## 2011-04-10 NOTE — Progress Notes (Signed)
Subjective:    Patient ID: Becky [name], female    DOB: Aug 13, 1955, 56 y.o.   MRN: [id number] 2/27 Pt got better then worse.  Symptoms now of cough, productive yellow mucus and green. No wheeze. Saw PCP for bronchitis, Rx doxycycline/pred. Just finished last week.  Asthma She complains of cough, difficulty breathing, shortness of breath and wheezing. There is no chest tightness, frequent throat clearing, hemoptysis, hoarse voice or sputum production. Primary symptoms comments: Chronic cough, occ prod yellow. This is a chronic problem. The current episode started more than 1 month ago. The problem occurs intermittently. The problem has been gradually improving. The cough is hacking and dry. Associated symptoms include dyspnea on exertion, nasal congestion and rhinorrhea. Pertinent negatives include no appetite change, chest pain, ear congestion, ear pain, fever, headaches, heartburn, malaise/fatigue, orthopnea, PND, postnasal drip, sneezing, sore throat, sweats, trouble swallowing or weight loss. Her symptoms are aggravated by any activity, exercise, change in weather, URI and strenuous activity. Her symptoms are alleviated by beta-agonist and steroid inhaler. She reports moderate improvement on treatment. Her past medical history is significant for asthma. There is no history of bronchiectasis, bronchitis, COPD, emphysema or pneumonia.    Past Medical History  Diagnosis Date  . Asthma   . GERD (gastroesophageal reflux disease)   . Chronic pain syndrome   . Migraine   . Gastroparesis     botox injections  . Anorexia nervosa   . Diabetes mellitus   . Multiple allergies   . Migraine   . Personality change due to conditions classified elsewhere   . Hyperglycemia   . Painful respiration   . Raynaud's syndrome   . Encounter for long-term (current) use of other medications   . Ventral hernia   . Vitamin d deficiency   . Tremor      Family History  Problem Relation Age of Onset  .  Emphysema Paternal Grandfather     was a smoker     History   Social History  . Marital Status: Single    Spouse Name: N/A    Number of Children: 0  . Years of Education: N/A   Occupational History  . Disabled    Social History Main Topics  . Smoking status: Former Smoker -- 0.5 packs/day for 2 years    Types: Cigarettes    Quit date: 02/12/1996  . Smokeless tobacco: Never Used  . Alcohol Use: No     quit ETOH in 1982  . Drug Use: No  . Sexually Active: Not on file   Other Topics Concern  . Not on file   Social History Narrative  . No narrative on file     Allergies  Allergen Reactions  . Aspirin     REACTION: Gi upset  . Clarithromycin     REACTION: rash,fever  . Codeine Nausea And Vomiting  . Methadone     REACTION: respiratory problems, chest pain  . Ms Contin (Morphine Sulfate)     CP and SOB  . Nitrofurantoin     REACTION: respiratory problems, chest pains  . Penicillins     REACTION: rash  . Percocet (Oxycodone-Acetaminophen)     CP and SOB  . Amoxicillin Rash     Outpatient Prescriptions Prior to Visit  Medication Sig Dispense Refill  . albuterol (PROAIR HFA) 108 (90 BASE) MCG/ACT inhaler Inhale 2 puffs into the lungs every 6 (six) hours as needed.        Marland Kitchen albuterol (PROVENTIL) (2.5 MG/3ML) 0.083%  nebulizer solution Take 2.5 mg by nebulization every 6 (six) hours as needed.        . budesonide-formoterol (SYMBICORT) 160-4.5 MCG/ACT inhaler Inhale 2 puffs into the lungs 2 (two) times daily.        Marland Kitchen buPROPion (WELLBUTRIN XL) 150 MG 24 hr tablet 3 tablets daily      . cetirizine (ZYRTEC) 10 MG tablet Take 10 mg by mouth every morning.        . Cholecalciferol 50000 UNIT capsule Take 50,000 Units by mouth as directed. Take 1 every week      . clonazePAM (KLONOPIN) 1 MG tablet Take 1 mg by mouth 3 (three) times daily. 1 three times daily and 2 at bedtime      . dicyclomine (BENTYL) 20 MG tablet Take 20 mg by mouth every 6 (six) hours.        .  DULoxetine (CYMBALTA) 30 MG capsule Take 30 mg by mouth daily. Take 1 pill AM with 2 x 60mg  Cymbalta for total of 150mg        . DULoxetine (CYMBALTA) 60 MG capsule 2 every am      . eszopiclone (LUNESTA) 2 MG TABS Take 2 mg by mouth at bedtime. Take immediately before bedtime       . fluticasone (FLONASE) 50 MCG/ACT nasal spray 2 sprays daily.        Marland Kitchen gabapentin (NEURONTIN) 300 MG capsule Take 300 mg by mouth 3 (three) times daily.        Marland Kitchen ibuprofen (ADVIL,MOTRIN) 600 MG tablet Take 600 mg by mouth 3 (three) times daily.        Marland Kitchen L-Methylfolate (DEPLIN) 15 MG TABS Take 1 tablet by mouth daily.       Marland Kitchen lidocaine (LIDODERM) 5 % Place 1 patch onto the skin daily as needed. Remove & Discard patch within 12 hours or as directed by MD      . Multiple Vitamins-Minerals ([facility] DAILY MULTIPLE WOMEN 50+ PO) Take by mouth daily.        Marland Kitchen omeprazole (PRILOSEC) 20 MG capsule Take 20 mg by mouth daily.        . QUEtiapine (SEROQUEL XR) 200 MG 24 hr tablet Take 150 mg by mouth daily after supper.       . traMADol (ULTRAM) 50 MG tablet Take 1-2 two times a day as needed for knee pain  60 tablet  1  . traZODone (DESYREL) 100 MG tablet Take 150 mg by mouth at bedtime. Takes 1/3 of 150 mg pill at bedtime.      . ziprasidone (GEODON) 20 MG capsule Take 20 mg by mouth daily after breakfast.        . ziprasidone (GEODON) 40 MG capsule Take by mouth. Taking 40 at dinner      . ziprasidone (GEODON) 80 MG capsule Take 80 mg by mouth at bedtime.            Review of Systems  Constitutional: Negative for fever, chills, weight loss, malaise/fatigue, appetite change and unexpected weight change.  HENT: Positive for rhinorrhea. Negative for ear pain, nosebleeds, congestion, sore throat, hoarse voice, sneezing, trouble swallowing, dental problem, voice change, postnasal drip and sinus pressure.   Eyes: Negative for visual disturbance.  Respiratory: Positive for cough, chest tightness, shortness of breath and wheezing.  Negative for hemoptysis, sputum production and choking.   Cardiovascular: Positive for dyspnea on exertion. Negative for chest pain, leg swelling and PND.  Gastrointestinal: Positive for abdominal pain. Negative for  heartburn, vomiting and diarrhea.  Genitourinary: Negative for difficulty urinating.  Musculoskeletal: Negative for arthralgias.  Skin: Negative for rash.  Neurological: Negative for tremors, syncope and headaches.  Hematological: Does not bruise/bleed easily.       Objective:   Physical Exam  Filed Vitals:   04/10/11 1514  BP: 150/96  Pulse: 94  Temp: 98.4 F (36.9 C)  TempSrc: Oral  Height: 5' 0.5" (1.537 m)  Weight: 181 lb (82.101 kg)  SpO2: 98%    Gen: Pleasant, well-nourished, in no distress,  normal affect  ENT: No lesions,  mouth clear,  oropharynx clear, no postnasal drip  Neck: No JVD, no TMG, no carotid bruits  Lungs: No use of accessory muscles, no dullness to percussion, reduced exp wheezes  Cardiovascular: RRR, heart sounds normal, no murmur or gallops, no peripheral edema  Abdomen: soft and NT, no HSM,  BS normal  Musculoskeletal: No deformities, no cyanosis or clubbing  Neuro: alert, non focal  Skin: Warm, no lesions or rashes    12/4 Cleda Daub: Normal      Assessment & Plan:   Extrinsic asthma, unspecified Suspected moderate persistent asthma with reactive airways disease as a cause of cyclical cough and dyspnea. Chronic sinusitis may also be playing a role. Plan Stay on Symbicort two puff twice daily Use albuterol as needed No other medication changes advised at this time Return 4  months        Updated Medication List Outpatient Encounter Prescriptions as of 04/10/2011  Medication Sig Dispense Refill  . albuterol (PROAIR HFA) 108 (90 BASE) MCG/ACT inhaler Inhale 2 puffs into the lungs every 6 (six) hours as needed.        Marland Kitchen albuterol (PROVENTIL) (2.5 MG/3ML) 0.083% nebulizer solution Take 2.5 mg by nebulization every 6  (six) hours as needed.        . benztropine (COGENTIN) 0.5 MG tablet Take 1 tablet by mouth at bedtime.      . budesonide-formoterol (SYMBICORT) 160-4.5 MCG/ACT inhaler Inhale 2 puffs into the lungs 2 (two) times daily.        Marland Kitchen buPROPion (WELLBUTRIN XL) 150 MG 24 hr tablet 3 tablets daily      . cetirizine (ZYRTEC) 10 MG tablet Take 10 mg by mouth every morning.        . Cholecalciferol 50000 UNIT capsule Take 50,000 Units by mouth as directed. Take 1 every week      . clonazePAM (KLONOPIN) 1 MG tablet Take 1 mg by mouth 3 (three) times daily. 1 three times daily and 2 at bedtime      . dicyclomine (BENTYL) 20 MG tablet Take 20 mg by mouth every 6 (six) hours.        . DULoxetine (CYMBALTA) 30 MG capsule Take 30 mg by mouth daily. Take 1 pill AM with 2 x 60mg  Cymbalta for total of 150mg        . DULoxetine (CYMBALTA) 60 MG capsule 2 every am      . eszopiclone (LUNESTA) 2 MG TABS Take 2 mg by mouth at bedtime. Take immediately before bedtime       . fluticasone (FLONASE) 50 MCG/ACT nasal spray 2 sprays daily.        Marland Kitchen gabapentin (NEURONTIN) 300 MG capsule Take 300 mg by mouth 3 (three) times daily.        Marland Kitchen ibuprofen (ADVIL,MOTRIN) 600 MG tablet Take 600 mg by mouth 3 (three) times daily.        Marland Kitchen L-Methylfolate (DEPLIN) 15 MG  TABS Take 1 tablet by mouth daily.       Marland Kitchen lidocaine (LIDODERM) 5 % Place 1 patch onto the skin daily as needed. Remove & Discard patch within 12 hours or as directed by MD      . montelukast (SINGULAIR) 10 MG tablet Take 1 tablet by mouth Daily.      . Multiple Vitamins-Minerals ([facility] DAILY MULTIPLE WOMEN 50+ PO) Take by mouth daily.        Marland Kitchen omeprazole (PRILOSEC) 20 MG capsule Take 20 mg by mouth daily.        . QUEtiapine (SEROQUEL XR) 200 MG 24 hr tablet Take 150 mg by mouth daily after supper.       . traMADol (ULTRAM) 50 MG tablet Take 1-2 two times a day as needed for knee pain  60 tablet  1  . traZODone (DESYREL) 100 MG tablet Take 150 mg by mouth at bedtime. Takes  1/3 of 150 mg pill at bedtime.      . ziprasidone (GEODON) 20 MG capsule Take 20 mg by mouth daily after breakfast.        . ziprasidone (GEODON) 40 MG capsule Take by mouth. Taking 40 at dinner      . ziprasidone (GEODON) 80 MG capsule Take 80 mg by mouth at bedtime.

## 2011-04-10 NOTE — Patient Instructions (Addendum)
No change in medications. Return in         4 months 

## 2011-04-11 NOTE — Assessment & Plan Note (Signed)
Suspected moderate persistent asthma with reactive airways disease as a cause of cyclical cough and dyspnea. Chronic sinusitis may also be playing a role. Plan Stay on Symbicort two puff twice daily Use albuterol as needed No other medication changes advised at this time Return 4  months

## 2011-04-15 ENCOUNTER — Ambulatory Visit: Payer: Medicare Other | Admitting: Family Medicine

## 2011-04-18 ENCOUNTER — Other Ambulatory Visit: Payer: Self-pay | Admitting: Family Medicine

## 2011-04-18 DIAGNOSIS — Z1231 Encounter for screening mammogram for malignant neoplasm of breast: Secondary | ICD-10-CM

## 2011-04-22 ENCOUNTER — Encounter: Payer: Self-pay | Admitting: Family Medicine

## 2011-04-22 ENCOUNTER — Ambulatory Visit (INDEPENDENT_AMBULATORY_CARE_PROVIDER_SITE_OTHER): Payer: Medicare Other | Admitting: Family Medicine

## 2011-04-22 VITALS — Ht 60.25 in | Wt 177.9 lb

## 2011-04-22 DIAGNOSIS — R7309 Other abnormal glucose: Secondary | ICD-10-CM

## 2011-04-22 NOTE — Progress Notes (Signed)
Medical Nutrition Therapy:  Appt start time: 1430 end time:  1500.  Assessment:  Primary concerns today: Elevated blood glucose (pre-DM) levels and anorexia nervosa. Becky [name] said she is committed to following the dietary recommendations provided.  She is finally feeling better from all the respiratory problems she has had, and her knees seem to be well enough to allow her to walk again.  Becky [name] hasn't been testing BG b/c she hasn't had test strips, but will refill soon.  Food records show numerous skipped meals as well as low protein (~18-35 g most days) and low kcal intake.    Progress:  In Progress.    Nutritional Diagnosis:  NB-1.5 Disordered eating pattern as related to weight anxiety as evidenced by expressed wt concerns and apparent restriction.      Intervention:  Nutrition counseling.     Monitoring/Evaluation:  Dietary intake, body weight, and physical activity in 3 weeks.

## 2011-04-22 NOTE — Patient Instructions (Signed)
-   If you know you are going to be out of the house over a meal time, BRING YOUR FOOD, or make plans to be able to eat that meal.    - NO SKIPPING MEALS!  - Please tell Natalia Leatherwood that if you are coming for the day again, you need to plan on lunch, i.e., ask if you should bring your own.   - Check into possibility of renewing [employer] scholarship, and what strength classes they have.   - If you want to use the protein shake, I suggest you use it at breakfast.  For lunch & dinner, be sure to get food protein along with veg's and limited starch, if desired.   - Limit starch foods to 1-2 servings per meal & 1 per snack.    - Starch foods:  Bread, rice, potatoes, pasta, corn, cereal, bagels, crackers, ....  - ONE serving = 1 slice of bread (or its equivalent) OR 1/2 cup of a "scoopable" carb food (potatoes, rice, corn, pasta) OR 1 oz of a baked starch food (crackers, pretzels) OR 15     grams of carb (see label).   - Continue to limit sodas of any kind.   - Exercise goal:  20 minutes 5 X wk.  Increase to 30 min every other time, IF knees feel ok.  Try to avoid down-hill walking.    - Email [name] every Friday to report exercise progress.

## 2011-05-03 ENCOUNTER — Ambulatory Visit: Payer: Medicare Other

## 2011-05-08 ENCOUNTER — Ambulatory Visit
Admission: RE | Admit: 2011-05-08 | Discharge: 2011-05-08 | Disposition: A | Discharge status: Home / Self Care | Payer: Medicare Other | Source: Ambulatory Visit | Attending: Family Medicine | Admitting: Family Medicine

## 2011-05-08 DIAGNOSIS — Z1231 Encounter for screening mammogram for malignant neoplasm of breast: Secondary | ICD-10-CM

## 2011-05-14 ENCOUNTER — Ambulatory Visit (INDEPENDENT_AMBULATORY_CARE_PROVIDER_SITE_OTHER): Payer: Medicare Other | Admitting: Family Medicine

## 2011-05-14 ENCOUNTER — Encounter: Payer: Self-pay | Admitting: Family Medicine

## 2011-05-14 VITALS — Ht 60.5 in | Wt 171.4 lb

## 2011-05-14 DIAGNOSIS — R7309 Other abnormal glucose: Secondary | ICD-10-CM

## 2011-05-14 DIAGNOSIS — F509 Eating disorder, unspecified: Secondary | ICD-10-CM

## 2011-05-14 NOTE — Progress Notes (Signed)
Medical Nutrition Therapy:  Appt start time: 1100 end time:  1200.  Assessment:  Primary concerns today: Elevated blood glucose (pre-DM) levels and anorexia nervosa. Destina said she has been making good food choices most days:  She has averaged >70 g protein per day and 5 veg/day in the past week.  She has been limiting starches to one per meal, and for the first time in a long time, Vonceil had NO skipped meals since last appt (4 wk).  She has not been walking, however; nor has she loked into [employer], as discussed.  She tried walking yesterday, but her R knee was hurting on an uphill.  She has noticed her knee does much better on flat surfaces.  She has figured out a place near home where she could walk ~30 min with little up- or down-hill, and hopes to start that this week.   Brinsley has been checking FBG daily, which has been 80s to 119.    Progress:  In Progress.    Nutritional Diagnosis:  Progress noted on NB-1.5 Disordered eating pattern as related to weight anxiety as evidenced by much more consistent eating pattern and balanced meals.      Intervention:  Nutrition counseling.     Monitoring/Evaluation:  Dietary intake, body weight, and physical activity in 3 weeks.

## 2011-05-14 NOTE — Patient Instructions (Addendum)
-   Congrat's on your good food choices! - Food goals:  1. 3 meals a day CONSISTENTLY.   2. Adequate protein (at least 55 grams) CONSISTENTLY.   3. Limited carb's (up to 1 starch per meal) CONSISTENTLY.   4. Vegetables (non-starchy): 8 servings per day  (distributed between lunch and dinner) CONSISTENTLY. - Physical activity goals (walking only on flat surface):  1. Explore [employer] options.   2. 30 min (any exercise) 3 X wk, building up frequency as able.

## 2011-06-04 ENCOUNTER — Ambulatory Visit: Payer: Medicare Other | Admitting: Family Medicine

## 2011-06-11 ENCOUNTER — Encounter: Payer: Self-pay | Admitting: Family Medicine

## 2011-06-11 ENCOUNTER — Ambulatory Visit (INDEPENDENT_AMBULATORY_CARE_PROVIDER_SITE_OTHER): Payer: Medicare Other | Admitting: Family Medicine

## 2011-06-11 VITALS — Ht 60.5 in | Wt 168.4 lb

## 2011-06-11 DIAGNOSIS — F509 Eating disorder, unspecified: Secondary | ICD-10-CM

## 2011-06-11 DIAGNOSIS — R7309 Other abnormal glucose: Secondary | ICD-10-CM

## 2011-06-11 NOTE — Progress Notes (Signed)
Medical Nutrition Therapy:  Appt start time: 1200 end time:  1230.  Assessment:  Primary concerns today: Elevated blood glucose (pre-DM) levels and anorexia nervosa. Becky [name] has made a commitment to a neighbor that she will walk three times this week with her.  This may be more challenging than she can manage, though.  Becky [name] has been so tired lately that she has had days when she has slept all day.  She has asked Dr. Madaline Guthrie re. med changes, but Dr. Madaline Guthrie has only made small adjustments in when she takes her med's.    Progress:  In Progress.    Nutritional Diagnosis:  Continued progress noted on NB-1.5 Disordered eating pattern as related to weight anxiety as evidenced by much more consistent eating pattern and balanced meals.      Intervention:  Nutrition counseling.     Monitoring/Evaluation:  Dietary intake, body weight, and physical activity in 3 weeks.

## 2011-06-11 NOTE — Patient Instructions (Signed)
-   PLAN your walking ahead of time - for the whole week.  Get agreement with your neighbor.   - When your walking partner can't or won't walk, you need to go anyway.   - Continue to push Dr. Madaline Guthrie to adjust your med's to help you feel less sleepy.    - Meanwhile, you will probably have the most success exercising early in the day.  Aim for this on days your neighbor won't be walking.   - Keep up the good work on your food choices!  Same goals, At least 55 g protein/day, 3 meals a day, and 8 veg's a day, and no more than 1 starch per meal.

## 2011-06-24 ENCOUNTER — Ambulatory Visit: Payer: Medicare Other | Admitting: Family Medicine

## 2011-07-01 ENCOUNTER — Encounter: Payer: Self-pay | Admitting: Family Medicine

## 2011-07-01 ENCOUNTER — Ambulatory Visit (INDEPENDENT_AMBULATORY_CARE_PROVIDER_SITE_OTHER): Payer: Medicare Other | Admitting: Family Medicine

## 2011-07-01 DIAGNOSIS — F509 Eating disorder, unspecified: Secondary | ICD-10-CM

## 2011-07-01 DIAGNOSIS — E119 Type 2 diabetes mellitus without complications: Secondary | ICD-10-CM | POA: Insufficient documentation

## 2011-07-01 NOTE — Progress Notes (Signed)
Medical Nutrition Therapy:  Appt start time: 1530 end time:  1545.  Assessment:  Primary concerns today: Elevated blood glucose (pre-DM) levels and eating disorder. 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  Meanwhile, pain is affecting her eating somewhat, so she looks forward to getting her procedure.  Dr. Madaline Guthrie has modified Becky [name]'s Klonipin medication, but she has not noticed any increase in energy or decreased fatigue.  She will continue to decrease each of the 4 daily dosages over the next few weeks.  Becky [name] has walked 6 X in the past three weeks, only once with her neighbor.  Becky [name] has been checking BG usually a couple times a day.  FBG has been low-100's to 100-teens.    Progress:  In Progress.    Nutritional Diagnosis:  Continued progress noted on NB-1.5 Disordered eating pattern as related to weight anxiety as evidenced by much more consistent eating pattern and balanced meals.      Intervention:  Nutrition counseling.     Monitoring/Evaluation:  Dietary intake, body weight, BG, and physical activity in 3 weeks.

## 2011-07-01 NOTE — Patient Instructions (Signed)
-   Instructions from last appt: - PLAN your walking ahead of time - for the whole week. Take the initiative with your neighbor to propose a schedule.   - Aim for MORNING exercise on days your neighbor won't be walking.  - Same food goals,   1. At least 55 g protein/day  2. 3 meals a day  3. 8 veg's a day  4. No more than 1 starch per meal - If you check blood sugar only once a day, make it fasting.    - Exercise is the area that needs work; another great job on the food choices!

## 2011-07-22 ENCOUNTER — Ambulatory Visit (INDEPENDENT_AMBULATORY_CARE_PROVIDER_SITE_OTHER): Payer: Medicare Other | Admitting: Family Medicine

## 2011-07-22 ENCOUNTER — Encounter: Payer: Self-pay | Admitting: Family Medicine

## 2011-07-22 DIAGNOSIS — F509 Eating disorder, unspecified: Secondary | ICD-10-CM

## 2011-07-22 DIAGNOSIS — E119 Type 2 diabetes mellitus without complications: Secondary | ICD-10-CM

## 2011-07-22 NOTE — Progress Notes (Signed)
Medical Nutrition Therapy:  Appt start time: 1200 end time:  1230.  Assessment:  Primary concerns today: Elevated blood glucose (pre-DM) levels and eating disorder. Becky [name] went to the beach for a few days, so did not keep her food record for those 4 days, although she did go walking each of those days, and was generally more active being around others.  She met her protein goal of 55 grams on most days, and did pretty well on other goals, 3 meals a day, and limiting starch to one per meal.  Some "meals" consisted of only a protein shake, however.  Becky [name] said this is b/c she has not had a chance to get to the store.  Did not do so well with veg's, especially while away.  She had started to walk more 15 min 3 X day daily for about a week before her dog got sick with vertigo (better now, but not 100% yet).    Progress:  In Progress.    Nutritional Diagnosis:  Continued progress noted on NB-1.5 Disordered eating pattern as related to weight anxiety as evidenced by much more consistent eating pattern and balanced meals.      Intervention:  Nutrition counseling.     Monitoring/Evaluation:  Dietary intake, body weight, BG, and physical activity in 3 weeks.

## 2011-07-22 NOTE — Patient Instructions (Signed)
-   Good job on the goals for the past 3 weeks.   - Emphasis on exercise, as you are able (w/ [place]).   - Emphasis also on vegetables! - Rule of thumb:  If protein shake for breakfast, no more as meal replacements that day.   - Continue with same food & exercise goals (still aiming for 5 X wk eventually).   - Get to the store soon.

## 2011-08-12 ENCOUNTER — Ambulatory Visit: Payer: Medicare Other | Admitting: Family Medicine

## 2011-08-14 ENCOUNTER — Ambulatory Visit (INDEPENDENT_AMBULATORY_CARE_PROVIDER_SITE_OTHER): Payer: Medicare Other | Admitting: Critical Care Medicine

## 2011-08-14 ENCOUNTER — Encounter: Payer: Self-pay | Admitting: Critical Care Medicine

## 2011-08-14 VITALS — BP 122/86 | HR 89 | Temp 98.7°F | Ht 60.5 in | Wt 169.6 lb

## 2011-08-14 DIAGNOSIS — J45909 Unspecified asthma, uncomplicated: Secondary | ICD-10-CM

## 2011-08-14 MED ORDER — BUDESONIDE-FORMOTEROL FUMARATE 160-4.5 MCG/ACT IN AERO: 2.0000 | INHALATION_SPRAY | Freq: Two times a day (BID) | RESPIRATORY_TRACT | Status: DC

## 2011-08-14 MED ORDER — ALBUTEROL SULFATE HFA 108 (90 BASE) MCG/ACT IN AERS: 2.0000 | INHALATION_SPRAY | Freq: Four times a day (QID) | RESPIRATORY_TRACT | Status: DC | PRN

## 2011-08-14 NOTE — Patient Instructions (Addendum)
No change in medications. Return in        6 months        

## 2011-08-14 NOTE — Progress Notes (Signed)
Subjective:    Patient ID: Becky [name], female    DOB: [birth year], 56 y.o.   MRN: [id number]   HPI 08/15/2011 Difficult time with talking and finishing sentences.  Notes some cough.  symbicort has helped Pt denies any significant sore throat, nasal congestion or excess secretions, fever, chills, sweats, unintended weight loss, pleurtic or exertional chest pain, orthopnea PND, or leg swelling Pt denies any increase in rescue therapy over baseline, denies waking up needing it or having any early am or nocturnal exacerbations of coughing/wheezing/or dyspnea. Pt also denies any obvious fluctuation in symptoms with  weather or environmental change or other alleviating or aggravating factors   Past Medical History  Diagnosis Date  . Asthma   . GERD (gastroesophageal reflux disease)   . Chronic pain syndrome   . Migraine   . Gastroparesis     botox injections  . Anorexia nervosa   . Diabetes mellitus   . Multiple allergies   . Migraine   . Personality change due to conditions classified elsewhere   . Hyperglycemia   . Painful respiration   . Raynaud's syndrome   . Encounter for long-term (current) use of other medications   . Ventral hernia   . Vitamin d deficiency   . Tremor      Family History  Problem Relation Age of Onset  . Emphysema Paternal Grandfather     was a smoker     History   Social History  . Marital Status: Single    Spouse Name: N/A    Number of Children: 0  . Years of Education: N/A   Occupational History  . Disabled    Social History Main Topics  . Smoking status: Former Smoker -- 0.5 packs/day for 2 years    Types: Cigarettes    Quit date: 02/11/1993  . Smokeless tobacco: Never Used  . Alcohol Use: No     quit ETOH in 1982  . Drug Use: No  . Sexually Active: Not on file   Other Topics Concern  . Not on file   Social History Narrative  . No narrative on file     Allergies  Allergen Reactions  . Aspirin     REACTION: Gi upset    . Clarithromycin     REACTION: rash,fever  . Codeine Nausea And Vomiting  . Methadone     REACTION: respiratory problems, chest pain  . Ms Contin (Morphine Sulfate)     CP and SOB  . Nitrofurantoin     REACTION: respiratory problems, chest pains  . Penicillins     REACTION: rash  . Percocet (Oxycodone-Acetaminophen)     CP and SOB  . Amoxicillin Rash     Outpatient Prescriptions Prior to Visit  Medication Sig Dispense Refill  . albuterol (PROVENTIL) (2.5 MG/3ML) 0.083% nebulizer solution Take 2.5 mg by nebulization every 6 (six) hours as needed.        . benztropine (COGENTIN) 0.5 MG tablet Take 1 tablet by mouth at bedtime.      Marland Kitchen buPROPion (WELLBUTRIN XL) 150 MG 24 hr tablet 3 tablets daily      . cetirizine (ZYRTEC) 10 MG tablet Take 10 mg by mouth every morning.        . Cholecalciferol 50000 UNIT capsule Take 50,000 Units by mouth as directed. Take 1 every week      . clonazePAM (KLONOPIN) 1 MG tablet Take 0.5 mg by mouth. 0.5 mg with meals (3 X day) and 1.5 mg at  bedtime.      . DULoxetine (CYMBALTA) 30 MG capsule Take 30 mg by mouth daily. Take 3 pills in AM for total of 90 mg.      . DULoxetine (CYMBALTA) 60 MG capsule Take 2 with lunch for total of 120 mg.      . eszopiclone (LUNESTA) 2 MG TABS Take 2 mg by mouth at bedtime. Take immediately before bedtime       . fluticasone (FLONASE) 50 MCG/ACT nasal spray 2 sprays daily.        Marland Kitchen gabapentin (NEURONTIN) 300 MG capsule Take 300 mg by mouth 3 (three) times daily.        Marland Kitchen ibuprofen (ADVIL,MOTRIN) 600 MG tablet Take 600 mg by mouth every 8 (eight) hours as needed.       Marland Kitchen L-Methylfolate (DEPLIN) 15 MG TABS Take 1 tablet by mouth daily.       Marland Kitchen lidocaine (LIDODERM) 5 % Place 1 patch onto the skin daily as needed. Remove & Discard patch within 12 hours or as directed by MD      . losartan (COZAAR) 50 MG tablet Take 50 mg by mouth daily.      . metFORMIN (GLUCOPHAGE) 500 MG tablet Take 500 mg by mouth daily after breakfast.       . montelukast (SINGULAIR) 10 MG tablet Take 1 tablet by mouth Daily.      . Multiple Vitamins-Minerals ([facility] DAILY MULTIPLE WOMEN 50+ PO) Take by mouth daily.        Marland Kitchen omeprazole (PRILOSEC) 20 MG capsule Take 20 mg by mouth daily.        Marland Kitchen Propylene Glycol (SYSTANE BALANCE OP) Apply to eye.      Marland Kitchen QUEtiapine (SEROQUEL XR) 200 MG 24 hr tablet Take 150 mg by mouth daily after supper.       . traMADol (ULTRAM) 50 MG tablet Take 1-2 two times a day as needed for knee pain  60 tablet  1  . traZODone (DESYREL) 100 MG tablet Take 50 mg by mouth at bedtime.       . ziprasidone (GEODON) 40 MG capsule Take by mouth. Taking 40 at dinner      . ziprasidone (GEODON) 80 MG capsule Take 80 mg by mouth at bedtime. Taking 80 mg at bedtime plus 20 mg dose at breakfast      . albuterol (PROAIR HFA) 108 (90 BASE) MCG/ACT inhaler Inhale 2 puffs into the lungs every 6 (six) hours as needed.        . budesonide-formoterol (SYMBICORT) 160-4.5 MCG/ACT inhaler Inhale 2 puffs into the lungs 2 (two) times daily.        Marland Kitchen dicyclomine (BENTYL) 20 MG tablet Take 20 mg by mouth every 6 (six) hours.        . ergocalciferol (VITAMIN D2) 50000 UNITS capsule Take 50,000 Units by mouth once a week.          Review of Systems  Constitutional: Negative for chills and unexpected weight change.  HENT: Negative for nosebleeds, congestion, dental problem, voice change and sinus pressure.   Eyes: Negative for visual disturbance.  Respiratory: Positive for chest tightness. Negative for choking.   Cardiovascular: Negative for leg swelling.  Gastrointestinal: Positive for abdominal pain. Negative for vomiting and diarrhea.  Genitourinary: Negative for difficulty urinating.  Musculoskeletal: Negative for arthralgias.  Skin: Negative for rash.  Neurological: Negative for tremors and syncope.  Hematological: Does not bruise/bleed easily.       Objective:  Physical Exam  Filed Vitals:   08/14/11 1522  BP: 122/86  Pulse: 89   Temp: 98.7 F (37.1 C)  TempSrc: Oral  Height: 5' 0.5" (1.537 m)  Weight: 76.93 kg (169 lb 9.6 oz)  SpO2: 99%    Gen: Pleasant, well-nourished, in no distress,  normal affect  ENT: No lesions,  mouth clear,  oropharynx clear, no postnasal drip  Neck: No JVD, no TMG, no carotid bruits  Lungs: No use of accessory muscles, no dullness to percussion, distant BS   Cardiovascular: RRR, heart sounds normal, no murmur or gallops, no peripheral edema  Abdomen: soft and NT, no HSM,  BS normal  Musculoskeletal: No deformities, no cyanosis or clubbing  Neuro: alert, non focal  Skin: Warm, no lesions or rashes    12/4 Cleda Daub: Normal      Assessment & Plan:   Extrinsic asthma, unspecified Moderate persis asthma stable Plan No change in inhaled or maintenance medications. Return in  6months    Updated Medication List Outpatient Encounter Prescriptions as of 08/14/2011  Medication Sig Dispense Refill  . albuterol (PROAIR HFA) 108 (90 BASE) MCG/ACT inhaler Inhale 2 puffs into the lungs every 6 (six) hours as needed.  1 Inhaler  6  . albuterol (PROVENTIL) (2.5 MG/3ML) 0.083% nebulizer solution Take 2.5 mg by nebulization every 6 (six) hours as needed.        . benztropine (COGENTIN) 0.5 MG tablet Take 1 tablet by mouth at bedtime.      . budesonide-formoterol (SYMBICORT) 160-4.5 MCG/ACT inhaler Inhale 2 puffs into the lungs 2 (two) times daily.  1 Inhaler  6  . buPROPion (WELLBUTRIN XL) 150 MG 24 hr tablet 3 tablets daily      . cetirizine (ZYRTEC) 10 MG tablet Take 10 mg by mouth every morning.        . Cholecalciferol 50000 UNIT capsule Take 50,000 Units by mouth as directed. Take 1 every week      . clonazePAM (KLONOPIN) 1 MG tablet Take 0.5 mg by mouth. 0.5 mg with meals (3 X day) and 1.5 mg at bedtime.      . DULoxetine (CYMBALTA) 30 MG capsule Take 30 mg by mouth daily. Take 3 pills in AM for total of 90 mg.      . DULoxetine (CYMBALTA) 60 MG capsule Take 2 with lunch for  total of 120 mg.      . eszopiclone (LUNESTA) 2 MG TABS Take 2 mg by mouth at bedtime. Take immediately before bedtime       . fluticasone (FLONASE) 50 MCG/ACT nasal spray 2 sprays daily.        Marland Kitchen gabapentin (NEURONTIN) 300 MG capsule Take 300 mg by mouth 3 (three) times daily.        Marland Kitchen ibuprofen (ADVIL,MOTRIN) 600 MG tablet Take 600 mg by mouth every 8 (eight) hours as needed.       Marland Kitchen L-Methylfolate (DEPLIN) 15 MG TABS Take 1 tablet by mouth daily.       Marland Kitchen lidocaine (LIDODERM) 5 % Place 1 patch onto the skin daily as needed. Remove & Discard patch within 12 hours or as directed by MD      . losartan (COZAAR) 50 MG tablet Take 50 mg by mouth daily.      . metFORMIN (GLUCOPHAGE) 500 MG tablet Take 500 mg by mouth daily after breakfast.      . montelukast (SINGULAIR) 10 MG tablet Take 1 tablet by mouth Daily.      Marland Kitchen  Multiple Vitamins-Minerals ([facility] DAILY MULTIPLE WOMEN 50+ PO) Take by mouth daily.        Marland Kitchen omeprazole (PRILOSEC) 20 MG capsule Take 20 mg by mouth daily.        . Probiotic Product (ALIGN PO) Take by mouth. Patient unsure of dose      . promethazine (PHENERGAN) 25 MG tablet Take 25 mg by mouth every 6 (six) hours as needed.      Marland Kitchen Propylene Glycol (SYSTANE BALANCE OP) Apply to eye.      Marland Kitchen QUEtiapine (SEROQUEL XR) 200 MG 24 hr tablet Take 150 mg by mouth daily after supper.       . traMADol (ULTRAM) 50 MG tablet Take 1-2 two times a day as needed for knee pain  60 tablet  1  . traZODone (DESYREL) 100 MG tablet Take 50 mg by mouth at bedtime.       . ziprasidone (GEODON) 40 MG capsule Take by mouth. Taking 40 at dinner      . ziprasidone (GEODON) 80 MG capsule Take 80 mg by mouth at bedtime. Taking 80 mg at bedtime plus 20 mg dose at breakfast      . DISCONTD: albuterol (PROAIR HFA) 108 (90 BASE) MCG/ACT inhaler Inhale 2 puffs into the lungs every 6 (six) hours as needed.        Marland Kitchen DISCONTD: budesonide-formoterol (SYMBICORT) 160-4.5 MCG/ACT inhaler Inhale 2 puffs into the lungs 2 (two)  times daily.        Marland Kitchen DISCONTD: dicyclomine (BENTYL) 20 MG tablet Take 20 mg by mouth every 6 (six) hours.        Marland Kitchen DISCONTD: ergocalciferol (VITAMIN D2) 50000 UNITS capsule Take 50,000 Units by mouth once a week.

## 2011-08-15 NOTE — Assessment & Plan Note (Signed)
Moderate persis asthma stable Plan No change in inhaled or maintenance medications. Return in  6months

## 2011-08-20 ENCOUNTER — Encounter (HOSPITAL_COMMUNITY)
Admission: RE | Disposition: A | Discharge status: Home / Self Care | Payer: Self-pay | Source: Ambulatory Visit | Attending: Gastroenterology

## 2011-08-20 ENCOUNTER — Encounter (HOSPITAL_COMMUNITY): Payer: Self-pay | Admitting: *Deleted

## 2011-08-20 ENCOUNTER — Ambulatory Visit (HOSPITAL_COMMUNITY)
Admission: RE | Admit: 2011-08-20 | Discharge: 2011-08-20 | Disposition: A | Discharge status: Home / Self Care | Payer: Medicare Other | Source: Ambulatory Visit | Attending: Gastroenterology | Admitting: Gastroenterology

## 2011-08-20 DIAGNOSIS — K3184 Gastroparesis: Secondary | ICD-10-CM | POA: Insufficient documentation

## 2011-08-20 DIAGNOSIS — K219 Gastro-esophageal reflux disease without esophagitis: Secondary | ICD-10-CM | POA: Insufficient documentation

## 2011-08-20 DIAGNOSIS — E119 Type 2 diabetes mellitus without complications: Secondary | ICD-10-CM | POA: Insufficient documentation

## 2011-08-20 DIAGNOSIS — F509 Eating disorder, unspecified: Secondary | ICD-10-CM

## 2011-08-20 DIAGNOSIS — G894 Chronic pain syndrome: Secondary | ICD-10-CM | POA: Insufficient documentation

## 2011-08-20 HISTORY — DX: Cardiac murmur, unspecified: R01.1

## 2011-08-20 HISTORY — DX: Unspecified osteoarthritis, unspecified site: M19.90

## 2011-08-20 HISTORY — PX: ESOPHAGOGASTRODUODENOSCOPY: SHX5428

## 2011-08-20 SURGERY — EGD (ESOPHAGOGASTRODUODENOSCOPY)
Anesthesia: Moderate Sedation

## 2011-08-20 MED ORDER — FENTANYL CITRATE 0.05 MG/ML IJ SOLN
INTRAMUSCULAR | Status: AC
  Filled 2011-08-20: qty 4

## 2011-08-20 MED ORDER — SODIUM CHLORIDE 0.9 % IV SOLN
INTRAVENOUS | Status: DC
  Administered 2011-08-20: 09:00:00 via INTRAVENOUS

## 2011-08-20 MED ORDER — ONABOTULINUMTOXINA 100 UNITS IJ SOLR
100.0000 [IU] | INTRAMUSCULAR | Status: AC
  Administered 2011-08-20: 100 [IU] via INTRAMUSCULAR
  Filled 2011-08-20: qty 100

## 2011-08-20 MED ORDER — DIPHENHYDRAMINE HCL 50 MG/ML IJ SOLN
INTRAMUSCULAR | Status: AC
  Filled 2011-08-20: qty 1

## 2011-08-20 MED ORDER — FENTANYL CITRATE 0.05 MG/ML IJ SOLN
INTRAMUSCULAR | Status: DC | PRN
  Administered 2011-08-20 (×3): 25 ug via INTRAVENOUS

## 2011-08-20 MED ORDER — BUTAMBEN-TETRACAINE-BENZOCAINE 2-2-14 % EX AERO
INHALATION_SPRAY | CUTANEOUS | Status: DC | PRN
  Administered 2011-08-20: 2 via TOPICAL

## 2011-08-20 MED ORDER — MIDAZOLAM HCL 10 MG/2ML IJ SOLN
INTRAMUSCULAR | Status: AC
  Filled 2011-08-20: qty 4

## 2011-08-20 MED ORDER — MIDAZOLAM HCL 10 MG/2ML IJ SOLN
INTRAMUSCULAR | Status: DC | PRN
  Administered 2011-08-20 (×3): 2.5 mg via INTRAVENOUS

## 2011-08-20 NOTE — Op Note (Signed)
[facility] [address] [place], [place]  [address]  ENDOSCOPY PROCEDURE REPORT  PATIENT:  Becky [name], Becky [name]  MR#:  [id number] BIRTHDATE:  03/30/1955, 56 yrs. old  GENDER:  female  ENDOSCOPIST:  Dorena Cookey Referred by:  PROCEDURE DATE:  08/20/2011 PROCEDURE: ASA CLASS: INDICATIONS:  MEDICATIONS: Fentanyl 75 mcg, Versed 7.5 mg. TOPICAL ANESTHETIC: Cetacaine spray.  DESCRIPTION OF PROCEDURE:   After the risks benefits and alternatives of the procedure were thoroughly explained, informed consent was obtained.  The Pentax Gastroscope [id number] endoscope was introduced through the mouth and advanced to the , without limitations.  The instrument was slowly withdrawn as the mucosa was fully examined. <<PROCEDUREIMAGES>>  FINDINGS:  Recently normal endoscopy, Botox injected in 4 quadrants at 25 mcg aliquots.  COMPLICATIONS:  ENDOSCOPIC IMPRESSION: Normal exam status post Botox injection and the pylorus for refractory gastroparesis  RECOMMENDATIONS: Repeat when necessary  REPEAT EXAM:  ______________________________ Dorena Cookey  CC:  n. eSIGNED:   Dorena Cookey at 08/20/2011 09:42 AM  Dub Mikes, [phone]

## 2011-08-20 NOTE — H&P (Signed)
[facility] Admission History & Physical  Chief Complaint: Nausea HPI: Becky [name] is an 56 y.o. white female.  Who presents for elective endoscopy and Botox injection do to chronic idiopathic gastroparesis.  Past Medical History  Diagnosis Date  . Asthma   . GERD (gastroesophageal reflux disease)   . Chronic pain syndrome   . Migraine   . Gastroparesis     botox injections  . Anorexia nervosa   . Diabetes mellitus   . Multiple allergies   . Migraine   . Personality change due to conditions classified elsewhere   . Hyperglycemia   . Painful respiration   . Raynaud's syndrome   . Encounter for long-term (current) use of other medications   . Ventral hernia   . Vitamin d deficiency   . Tremor   . Arthritis     bilateral knees  . Murmur, cardiac     Past Surgical History  Procedure Date  . Nissen fundoplication 2005    [name]  . Cholecystectomy   . Peg tube placement     removed 1996  . Tonsillectomy and adenoidectomy   . Hernia repair     Medications Prior to Admission  Medication Sig Dispense Refill  . albuterol (PROAIR HFA) 108 (90 BASE) MCG/ACT inhaler Inhale 2 puffs into the lungs every 6 (six) hours as needed.  1 Inhaler  6  . albuterol (PROVENTIL) (2.5 MG/3ML) 0.083% nebulizer solution Take 2.5 mg by nebulization every 6 (six) hours as needed.        . benztropine (COGENTIN) 0.5 MG tablet Take 1 tablet by mouth at bedtime.      . budesonide-formoterol (SYMBICORT) 160-4.5 MCG/ACT inhaler Inhale 2 puffs into the lungs 2 (two) times daily.  1 Inhaler  6  . buPROPion (WELLBUTRIN XL) 150 MG 24 hr tablet 3 tablets daily      . cetirizine (ZYRTEC) 10 MG tablet Take 10 mg by mouth every morning.        . Cholecalciferol 50000 UNIT capsule Take 50,000 Units by mouth as directed. Take 1 every week      . clonazePAM (KLONOPIN) 1 MG tablet Take 0.5 mg by mouth. 0.5 mg with meals (3 X day) and 1.5 mg at bedtime.      . DULoxetine (CYMBALTA) 30  MG capsule Take 30 mg by mouth daily. Take 3 pills in AM for total of 90 mg.      . DULoxetine (CYMBALTA) 60 MG capsule Take 60 mg by mouth. Take 2 with lunch for total of 120 mg; 08/20/11 pt states she takes total of 60 mg at lunch      . eszopiclone (LUNESTA) 2 MG TABS Take 2 mg by mouth at bedtime. Take immediately before bedtime       . fluticasone (FLONASE) 50 MCG/ACT nasal spray 2 sprays daily.        Marland Kitchen gabapentin (NEURONTIN) 300 MG capsule Take 300 mg by mouth 3 (three) times daily.        Marland Kitchen ibuprofen (ADVIL,MOTRIN) 600 MG tablet Take 600 mg by mouth 2 (two) times daily. 08/20/11 per pt, taking in morning and at night per MD instructions      . L-Methylfolate (DEPLIN) 15 MG TABS Take 1 tablet by mouth daily.       Marland Kitchen losartan (COZAAR) 50 MG tablet Take 50 mg by mouth daily.      . metFORMIN (GLUCOPHAGE) 500 MG tablet Take 500 mg by mouth daily after breakfast.      .  montelukast (SINGULAIR) 10 MG tablet Take 1 tablet by mouth Daily.      . Multiple Vitamins-Minerals ([facility] DAILY MULTIPLE WOMEN 50+ PO) Take by mouth daily.        Marland Kitchen omeprazole (PRILOSEC) 20 MG capsule Take 20 mg by mouth daily.        . Probiotic Product (ALIGN PO) Take by mouth. Patient unsure of dose      . promethazine (PHENERGAN) 25 MG tablet Take 25 mg by mouth every 6 (six) hours as needed.      Marland Kitchen Propylene Glycol (SYSTANE BALANCE OP) Apply to eye.      Marland Kitchen QUEtiapine (SEROQUEL XR) 200 MG 24 hr tablet Take 150 mg by mouth daily after supper.       . traMADol (ULTRAM) 50 MG tablet Take 1-2 two times a day as needed for knee pain  60 tablet  1  . traZODone (DESYREL) 100 MG tablet Take 50 mg by mouth at bedtime.       . ziprasidone (GEODON) 40 MG capsule Take by mouth. Taking 40 at dinner      . ziprasidone (GEODON) 80 MG capsule Take 80 mg by mouth at bedtime. Taking 80 mg at bedtime plus 20 mg dose at breakfast      . lidocaine (LIDODERM) 5 % Place 1 patch onto the skin daily as needed. Remove & Discard patch within 12 hours or  as directed by MD        Allergies:  Allergies  Allergen Reactions  . Aspirin     REACTION: Gi upset  . Clarithromycin     REACTION: rash,fever  . Codeine Nausea And Vomiting  . Methadone     REACTION: respiratory problems, chest pain  . Ms Contin (Morphine Sulfate)     CP and SOB  . Nitrofurantoin     REACTION: respiratory problems, chest pains  . Penicillins     REACTION: rash  . Percocet (Oxycodone-Acetaminophen)     CP and SOB  . Amoxicillin Rash    Family History  Problem Relation Age of Onset  . Emphysema Paternal Grandfather     was a smoker    Social History:  reports that she quit smoking about 18 years ago. Her smoking use included Cigarettes. She has a 1 pack-year smoking history. She has never used smokeless tobacco. She reports that she does not drink alcohol or use illicit drugs.  Review of Systems: negative except as above   Blood pressure 131/70, temperature 98.1 F (36.7 C), temperature source Oral, resp. rate 22, height 5' 0.5" (1.537 m), SpO2 97.00%. Head: Normocephalic, without obvious abnormality, atraumatic Neck: no adenopathy, no carotid bruit, no JVD, supple, symmetrical, trachea midline and thyroid not enlarged, symmetric, no tenderness/mass/nodules Resp: clear to auscultation bilaterally Cardio: regular rate and rhythm, S1, S2 normal, no murmur, click, rub or gallop GI: Abdomen soft nondistended with normoactive bowel sounds Extremities: extremities normal, atraumatic, no cyanosis or edema  Results for orders placed during the hospital encounter of 08/20/11 (from the past 48 hour(s))  GLUCOSE, CAPILLARY     Status: Normal   Collection Time   08/20/11  8:45 AM      Component Value Range Comment   Glucose-Capillary 99  70 - 99 mg/dL    No results found.  Assessment: Gastroparesis Plan: Proceed with EGD and Botox treatment.  [name] C 08/20/2011, 9:27 AM

## 2011-08-21 ENCOUNTER — Encounter (HOSPITAL_COMMUNITY): Payer: Self-pay | Admitting: Gastroenterology

## 2011-08-22 ENCOUNTER — Encounter: Payer: Self-pay | Admitting: Family Medicine

## 2011-08-22 ENCOUNTER — Ambulatory Visit (INDEPENDENT_AMBULATORY_CARE_PROVIDER_SITE_OTHER): Payer: Medicare Other | Admitting: Family Medicine

## 2011-08-22 DIAGNOSIS — F509 Eating disorder, unspecified: Secondary | ICD-10-CM

## 2011-08-22 DIAGNOSIS — E119 Type 2 diabetes mellitus without complications: Secondary | ICD-10-CM

## 2011-08-22 NOTE — Patient Instructions (Addendum)
-   Take Becky [name] to the park later when he comes home on Monday, and turn around at 10 minutes out.  Add 5 minutes to your walk each time you go out.    - Walking by yourself:    1. Devise at least 3 plans that might allow you to feel more comfortable walking, i.e., walk to [employer] from home; limit walk to 10 minutes.    2. Email [name] this list no later than next Sun.   3. Choose one plan, and give it a try Sun afternoon. - Same food goals:  At least 55 g protein/day; 3 meals a day; veg's 2 X day.    - Aim for variety in protein sources and in veg's.   - (REMEMBER CONSISTENCY!)

## 2011-08-22 NOTE — Progress Notes (Signed)
Medical Nutrition Therapy:  Appt start time: 1330 end time:  1400.  Assessment:  Primary concerns today: Elevated blood glucose (pre-DM) levels and eating disorder. Becky [name] had an endoscopy with botox injection for her gastroparesis on Tuesday.  She is taking phenergan as needed for nausea, but only took once yesterday, and has none today; was taking 3 X day before endoscopy.  She has been eating three meals a day consistently, but many "meals" were small, and consisted of only a couple of components.  Now that she has had her botox treatment, she hopes to get back to more normal eating.  Average protein intake has been >60 g/day. Becky [name] has walked only twice in the past month.  She said she has felt afraid to walk with her dog b/c of his barking at other dogs and pulling, and she feels afraid to walk alone as well, but is not sure why.  Becky [name] said she is going to stop seeing therapist Becky [name] b/c they "don't see eye-to-eye."  As an example, Becky [name] cited her current fear of walking, which she felt Becky [name] simply wrote off.  I challenged Becky [name] to talk to Becky [name] about her concerns rather than to cancel her appt.   Becky [name]'s FBG have been running 98-120.    Progress:  In Progress.    Nutritional Diagnosis:  Stable progress noted on NB-1.5 Disordered eating pattern as related to weight anxiety as evidenced by continued consistent eating pattern and balanced meals.      Intervention:  Nutrition counseling.     Monitoring/Evaluation:  Dietary intake, body weight, BG, and physical activity in 3 weeks.

## 2011-08-27 ENCOUNTER — Telehealth: Payer: Self-pay | Admitting: Critical Care Medicine

## 2011-08-27 NOTE — Telephone Encounter (Signed)
Dr. Delford Field does not manage pt's Cymbalta. RX was sent on 08/14/11 for pt's Proair and Symbicort. Verified with [facility] [address] that they have refills remaining for pt's Proair and Symbicort. LMOMTCB x 1 for the pt.

## 2011-08-27 NOTE — Telephone Encounter (Signed)
Pt is aware. Jennifer Castillo, CMA  

## 2011-09-02 ENCOUNTER — Ambulatory Visit: Payer: Medicare Other | Admitting: Family Medicine

## 2011-09-09 ENCOUNTER — Ambulatory Visit: Payer: Medicare Other | Admitting: Family Medicine

## 2011-09-30 ENCOUNTER — Ambulatory Visit (INDEPENDENT_AMBULATORY_CARE_PROVIDER_SITE_OTHER): Payer: Medicare Other | Admitting: Family Medicine

## 2011-09-30 ENCOUNTER — Encounter: Payer: Self-pay | Admitting: Family Medicine

## 2011-09-30 DIAGNOSIS — F509 Eating disorder, unspecified: Secondary | ICD-10-CM

## 2011-09-30 DIAGNOSIS — E119 Type 2 diabetes mellitus without complications: Secondary | ICD-10-CM

## 2011-09-30 NOTE — Patient Instructions (Addendum)
-   Goal:  Vegetables twice a day! - Goal:  55 grams of protein (= a significant protein source with each meal) - Goal:  Limit nuts to 1/3 cup/day.   - From last appt: Walking by yourself:  1. Devise at least 3 plans that might allow you to feel more comfortable walking, i.e., walk to [employer] from home; limit walk to 10 minutes.  2. Email [name] this list no later than next Sun.  3. Choose one plan, and give it a try Sun afternoon. - Check fasting blood sugars, and record on food record.

## 2011-09-30 NOTE — Progress Notes (Signed)
Medical Nutrition Therapy:  Appt start time: 1330 end time:  1400.  Assessment:  Primary concerns today: Elevated blood glucose (pre-DM) levels and eating disorder. Becky [name] has stopped seeing therapist Becky [name], and she is going to try to re-start 45-minute phone appts every other month with Becky [name] on Sept 10.  She has also made an appt to see Becky Goad, MD to discuss bariatric surgery.  Becky [name] said she has been very depressed recently, and that this is all related to her weight.  She continues to feel exhausted.  She has walked only a couple of times in the past few weeks.   Becky [name] has not been recording food records or checking BG levels, but said she will once she gets another copy of the BG record form I emailed her today.  She has been keeping her 2-YO nephew most days recently, which has meant no time or energy left for recording.    Progress:  In Progress.    Nutritional Diagnosis:  Stable progress noted on NB-1.5 Disordered eating pattern as related to weight anxiety as evidenced by continued consistent eating pattern and balanced meals.      Intervention:  Nutrition counseling.     Monitoring/Evaluation:  Dietary intake, body weight, BG, and physical activity in 3 weeks.

## 2011-10-21 ENCOUNTER — Ambulatory Visit (INDEPENDENT_AMBULATORY_CARE_PROVIDER_SITE_OTHER): Payer: Medicare Other | Admitting: Family Medicine

## 2011-10-21 ENCOUNTER — Encounter: Payer: Self-pay | Admitting: Family Medicine

## 2011-10-21 VITALS — BP 132/92 | Ht 60.0 in | Wt 163.0 lb

## 2011-10-21 DIAGNOSIS — E119 Type 2 diabetes mellitus without complications: Secondary | ICD-10-CM

## 2011-10-21 DIAGNOSIS — F509 Eating disorder, unspecified: Secondary | ICD-10-CM

## 2011-10-21 DIAGNOSIS — M1710 Unilateral primary osteoarthritis, unspecified knee: Secondary | ICD-10-CM

## 2011-10-21 DIAGNOSIS — IMO0002 Reserved for concepts with insufficient information to code with codable children: Secondary | ICD-10-CM

## 2011-10-21 NOTE — Progress Notes (Signed)
  Subjective:    Patient ID: Becky [name], female    DOB: Jun 26, 1955, 56 y.o.   MRN: [id number]  HPI  3-4 weeks of worsening bilateral knee pain. She had her last corticosteroid injection in January and has done well since then.In the last 3-4 weeks she has noted increased aching particularly with steps and with prolonged walking. She is having some popping in the left knee but no giving way. Has noted no knee swelling, redness, warmth.  Review of Systems Denies fever, sweats, chills. Please see history of present illness above for additional partner review of systems.    Objective:   Physical Exam  GENERAL: Well-developed female no acute distress vital signs are reviewed KNEES: Symmetrical. No effusion or erythema or warmth noted on either knee. Some mild crepitus on the left. Bilaterally she has full range of motion in flexion extension. Left greater than right medial joint line tenderness. Lateral joint line is nontender. Calves are soft.  INJECTION: Patient was given informed consent, signed copy in the chart. Appropriate time out was taken. Area prepped and draped in usual sterile fashion. One cc of methylprednisolone 40 mg/ml plus  4 cc of 1% lidocaine without epinephrine was injected into the Bilateral knees using a(n) Anterior medial approach. The patient tolerated the procedure well. There were no complications. Post procedure instructions were given.     Assessment & Plan:

## 2011-10-21 NOTE — Assessment & Plan Note (Signed)
Last corticosteroid injection was in January so she is essentially been pain-free for 7 months. Today we injected both knees. Followup when necessary.

## 2011-10-21 NOTE — Progress Notes (Signed)
Medical Nutrition Therapy:  Appt start time: 1200 end time:  1300.  Assessment:  Primary concerns today: Elevated blood glucose (pre-DM) levels and eating disorder. Anju has been nauseous for more than a week (but unlike how she feels when she needs the botox treatment at the pyloric valve).  Consequently, she has not eaten much in the past couple of weeks.  She is also dizzy and has a headache daily (allergies?).  She sees Karen Chafe, [place] at [facility].  She has also not been able to walk more than cumulative 30 min a day (taking the dog out 3-4 X day) b/c she feels so bad.  BG levels have been predictably low w/ low food intake.   Progress:  In Progress.    Nutritional Diagnosis:  Stable progress noted on NB-1.5 Disordered eating pattern as related to weight anxiety as evidenced by continued consistent eating pattern and balanced meals.      Intervention:  Nutrition counseling.     Monitoring/Evaluation:  Dietary intake, body weight, BG, and physical activity in 3 weeks.

## 2011-10-21 NOTE — Patient Instructions (Addendum)
-   Goals remain the same, but this is pending your feeling better, and being able to eat and to exercise comfortably.  - Do continue to stay hydrated, and eat when you can.   - Call/email if you get a diagnosis from Karen Chafe tomorrow.

## 2011-11-11 ENCOUNTER — Ambulatory Visit: Payer: Medicare Other | Admitting: Family Medicine

## 2011-11-11 ENCOUNTER — Telehealth: Payer: Self-pay | Admitting: Critical Care Medicine

## 2011-11-11 NOTE — Telephone Encounter (Addendum)
LMOMTCB x 1  A slot has been held on TP scheduled today at 4:15 per Mindy.

## 2011-11-11 NOTE — Telephone Encounter (Signed)
lmomtcb x1--offer TP she is in [facility] int he AM.

## 2011-11-11 NOTE — Telephone Encounter (Signed)
Pt returned call. She wants to be seen "today". (and not in HP tomorrow). Becky [name]

## 2011-11-12 NOTE — Telephone Encounter (Signed)
Pt did not show for appt w/ TP yesterday.  LMOM TCB x2.

## 2011-11-12 NOTE — Telephone Encounter (Signed)
noted 

## 2011-11-12 NOTE — Telephone Encounter (Signed)
Pt returned call who reported that she was able to see her PCP yesterday morning and was given an abx and pred taper > pt was being seen when we called her home.  Nothing further needed per pt and she will call if needed.    Will sign and forward to PW as FYI.

## 2011-11-26 ENCOUNTER — Encounter: Payer: Self-pay | Admitting: Critical Care Medicine

## 2011-11-26 ENCOUNTER — Ambulatory Visit (INDEPENDENT_AMBULATORY_CARE_PROVIDER_SITE_OTHER): Payer: Medicare Other | Admitting: Critical Care Medicine

## 2011-11-26 VITALS — BP 130/80 | HR 90 | Temp 98.4°F | Ht 60.5 in | Wt 166.0 lb

## 2011-11-26 DIAGNOSIS — J45909 Unspecified asthma, uncomplicated: Secondary | ICD-10-CM

## 2011-11-26 MED ORDER — OMEPRAZOLE 20 MG PO CPDR: 20.0000 mg | DELAYED_RELEASE_CAPSULE | Freq: Two times a day (BID) | ORAL | Status: DC

## 2011-11-26 MED ORDER — MOMETASONE FURO-FORMOTEROL FUM 200-5 MCG/ACT IN AERO: 2.0000 | INHALATION_SPRAY | Freq: Two times a day (BID) | RESPIRATORY_TRACT | Status: DC

## 2011-11-26 NOTE — Patient Instructions (Addendum)
Stop Symbicort Trial Dulera 200 two puff twice daily No other medication changes Increase omeprazole to twice daily Return 3 weeks for recheck

## 2011-11-26 NOTE — Progress Notes (Signed)
Subjective:    Patient ID: Becky [name], female    DOB: 1955/04/02, 56 y.o.   MRN: [id number]   HPI  08/14/11 Difficult time with talking and finishing sentences.  Notes some cough.  symbicort has helped Pt denies any significant sore throat, nasal congestion or excess secretions, fever, chills, sweats, unintended weight loss, pleurtic or exertional chest pain, orthopnea PND, or leg swelling Pt denies any increase in rescue therapy over baseline, denies waking up needing it or having any early am or nocturnal exacerbations of coughing/wheezing/or dyspnea. Pt also denies any obvious fluctuation in symptoms with  weather or environmental change or other alleviating or aggravating factors  11/26/2011 No changes made at last OV Since last OV:  Pt worse two weeks ago,  Saw PCP.  Rx doxycycline and pred x 10days. This did help.  Symptoms then : wheezing, coughing, dyspnea, noted excess mucus. Now mucus is better.  Cough is better.  Pt still needing albuterol nebs Q2H.  X 3 given last eve. Now off prednisone. Pt still with heartburn  And indigestion, does not normally have this    Past Medical History  Diagnosis Date  . Asthma   . GERD (gastroesophageal reflux disease)   . Chronic pain syndrome   . Migraine   . Gastroparesis     botox injections  . Anorexia nervosa   . Diabetes mellitus   . Multiple allergies   . Migraine   . Personality change due to conditions classified elsewhere   . Hyperglycemia   . Painful respiration   . Raynaud's syndrome   . Encounter for long-term (current) use of other medications   . Ventral hernia   . Vitamin D deficiency   . Tremor   . Arthritis     bilateral knees  . Murmur, cardiac      Family History  Problem Relation Age of Onset  . Emphysema Paternal Grandfather     was a smoker     History   Social History  . Marital Status: Single    Spouse Name: N/A    Number of Children: 0  . Years of Education: N/A   Occupational  History  . Disabled    Social History Main Topics  . Smoking status: Former Smoker -- 0.5 packs/day for 2 years    Types: Cigarettes    Quit date: 02/11/1993  . Smokeless tobacco: Never Used  . Alcohol Use: No     quit ETOH in 1982  . Drug Use: No  . Sexually Active: Not on file   Other Topics Concern  . Not on file   Social History Narrative  . No narrative on file     Allergies  Allergen Reactions  . Aspirin     REACTION: Gi upset  . Clarithromycin     REACTION: rash,fever  . Codeine Nausea And Vomiting  . Methadone     REACTION: respiratory problems, chest pain  . Ms Contin (Morphine Sulfate)     CP and SOB  . Nitrofurantoin     REACTION: respiratory problems, chest pains  . Penicillins     REACTION: rash  . Percocet (Oxycodone-Acetaminophen)     CP and SOB  . Amoxicillin Rash     Outpatient Prescriptions Prior to Visit  Medication Sig Dispense Refill  . albuterol (PROAIR HFA) 108 (90 BASE) MCG/ACT inhaler Inhale 2 puffs into the lungs every 6 (six) hours as needed.  1 Inhaler  6  . albuterol (PROVENTIL) (2.5 MG/3ML) 0.083%  nebulizer solution Take 2.5 mg by nebulization every 6 (six) hours as needed.        . benztropine (COGENTIN) 0.5 MG tablet Take 1 tablet by mouth at bedtime.      Marland Kitchen buPROPion (WELLBUTRIN XL) 150 MG 24 hr tablet 3 tablets daily      . cetirizine (ZYRTEC) 10 MG tablet Take 10 mg by mouth every morning.        . Cholecalciferol 50000 UNIT capsule Take 50,000 Units by mouth as directed. Take 1 every week      . clonazePAM (KLONOPIN) 1 MG tablet Take 0.5 mg by mouth. 0.5 mg with meals (3 X day) and 1.5 mg at bedtime.      . DULoxetine (CYMBALTA) 30 MG capsule Take 30 mg by mouth daily. Take 3 pills in AM for total of 90 mg.      . DULoxetine (CYMBALTA) 60 MG capsule Take 60 mg by mouth. Taken at lunch time.      . eszopiclone (LUNESTA) 2 MG TABS Take 2 mg by mouth at bedtime. Take immediately before bedtime       . fluticasone (FLONASE) 50  MCG/ACT nasal spray 2 sprays daily.        Marland Kitchen gabapentin (NEURONTIN) 300 MG capsule Take 300 mg by mouth 3 (three) times daily.        Marland Kitchen ibuprofen (ADVIL,MOTRIN) 600 MG tablet Take 600 mg by mouth 2 (two) times daily. 08/20/11 per pt, taking in morning and at night per MD instructions      . L-Methylfolate (DEPLIN) 15 MG TABS Take 1 tablet by mouth daily.       Marland Kitchen lidocaine (LIDODERM) 5 % Place 1 patch onto the skin daily as needed. Remove & Discard patch within 12 hours or as directed by MD      . losartan (COZAAR) 50 MG tablet Take 50 mg by mouth daily.      . metFORMIN (GLUCOPHAGE) 500 MG tablet Take 500 mg by mouth daily after breakfast.      . montelukast (SINGULAIR) 10 MG tablet Take 1 tablet by mouth Daily.      . Multiple Vitamins-Minerals ([facility] DAILY MULTIPLE WOMEN 50+ PO) Take by mouth daily.        . promethazine (PHENERGAN) 25 MG tablet Take 25 mg by mouth every 6 (six) hours as needed.      Marland Kitchen Propylene Glycol (SYSTANE BALANCE OP) Apply to eye.      Marland Kitchen QUEtiapine (SEROQUEL XR) 200 MG 24 hr tablet Take 150 mg by mouth daily after supper.       . traMADol (ULTRAM) 50 MG tablet Take 1-2 two times a day as needed for knee pain  60 tablet  1  . traZODone (DESYREL) 100 MG tablet Take 50 mg by mouth at bedtime.       . ziprasidone (GEODON) 40 MG capsule Take by mouth. Taking 40 at dinner      . ziprasidone (GEODON) 80 MG capsule Take 80 mg by mouth at bedtime. Taking 80 mg at bedtime plus 20 mg dose at breakfast      . budesonide-formoterol (SYMBICORT) 160-4.5 MCG/ACT inhaler Inhale 2 puffs into the lungs 2 (two) times daily.  1 Inhaler  6  . omeprazole (PRILOSEC) 20 MG capsule Take 20 mg by mouth daily.        . Probiotic Product (ALIGN PO) Take by mouth. Patient unsure of dose          Review of  Systems  Constitutional: Negative for chills and unexpected weight change.  HENT: Negative for nosebleeds, congestion, dental problem, voice change and sinus pressure.   Eyes: Negative for visual  disturbance.  Respiratory: Positive for chest tightness. Negative for choking.   Cardiovascular: Negative for leg swelling.  Gastrointestinal: Positive for abdominal pain. Negative for vomiting and diarrhea.  Genitourinary: Negative for difficulty urinating.  Musculoskeletal: Negative for arthralgias.  Skin: Negative for rash.  Neurological: Negative for tremors and syncope.  Hematological: Does not bruise/bleed easily.       Objective:   Physical Exam  Filed Vitals:   11/26/11 1128  BP: 130/80  Pulse: 90  Temp: 98.4 F (36.9 C)  TempSrc: Oral  Height: 5' 0.5" (1.537 m)  Weight: 166 lb (75.297 kg)  SpO2: 98%    Gen: Pleasant, well-nourished, in no distress,  normal affect  ENT: No lesions,  mouth clear,  oropharynx clear, no postnasal drip  Neck: No JVD, no TMG, no carotid bruits  Lungs: No use of accessory muscles, no dullness to percussion, distant BS   Cardiovascular: RRR, heart sounds normal, no murmur or gallops, no peripheral edema  Abdomen: soft and NT, no HSM,  BS normal  Musculoskeletal: No deformities, no cyanosis or clubbing  Neuro: alert, non focal  Skin: Warm, no lesions or rashes         Assessment & Plan:   Extrinsic asthma, unspecified Severe persistent asthma with recent exacerbation now resolved Concern oral for level of control asthma on Symbicort 160 Reflux likely playing a significant role Plan Stop Symbicort Trial Dulera 200 two puff twice daily No other medication changes Increase omeprazole to twice daily Return 3 weeks for recheck      Updated Medication List Outpatient Encounter Prescriptions as of 11/26/2011  Medication Sig Dispense Refill  . albuterol (PROAIR HFA) 108 (90 BASE) MCG/ACT inhaler Inhale 2 puffs into the lungs every 6 (six) hours as needed.  1 Inhaler  6  . albuterol (PROVENTIL) (2.5 MG/3ML) 0.083% nebulizer solution Take 2.5 mg by nebulization every 6 (six) hours as needed.        . benztropine  (COGENTIN) 0.5 MG tablet Take 1 tablet by mouth at bedtime.      Marland Kitchen buPROPion (WELLBUTRIN XL) 150 MG 24 hr tablet 3 tablets daily      . cetirizine (ZYRTEC) 10 MG tablet Take 10 mg by mouth every morning.        . Cholecalciferol 50000 UNIT capsule Take 50,000 Units by mouth as directed. Take 1 every week      . clonazePAM (KLONOPIN) 1 MG tablet Take 0.5 mg by mouth. 0.5 mg with meals (3 X day) and 1.5 mg at bedtime.      . DULoxetine (CYMBALTA) 30 MG capsule Take 30 mg by mouth daily. Take 3 pills in AM for total of 90 mg.      . DULoxetine (CYMBALTA) 60 MG capsule Take 60 mg by mouth. Taken at lunch time.      . eszopiclone (LUNESTA) 2 MG TABS Take 2 mg by mouth at bedtime. Take immediately before bedtime       . fluticasone (FLONASE) 50 MCG/ACT nasal spray 2 sprays daily.        Marland Kitchen gabapentin (NEURONTIN) 300 MG capsule Take 300 mg by mouth 3 (three) times daily.        Marland Kitchen ibuprofen (ADVIL,MOTRIN) 600 MG tablet Take 600 mg by mouth 2 (two) times daily. 08/20/11 per pt, taking in morning and at  night per MD instructions      . L-Methylfolate (DEPLIN) 15 MG TABS Take 1 tablet by mouth daily.       Marland Kitchen lidocaine (LIDODERM) 5 % Place 1 patch onto the skin daily as needed. Remove & Discard patch within 12 hours or as directed by MD      . losartan (COZAAR) 50 MG tablet Take 50 mg by mouth daily.      . metFORMIN (GLUCOPHAGE) 500 MG tablet Take 500 mg by mouth daily after breakfast.      . montelukast (SINGULAIR) 10 MG tablet Take 1 tablet by mouth Daily.      . Multiple Vitamins-Minerals ([facility] DAILY MULTIPLE WOMEN 50+ PO) Take by mouth daily.        Marland Kitchen omeprazole (PRILOSEC) 20 MG capsule Take 1 capsule (20 mg total) by mouth 2 (two) times daily.  60 capsule  6  . promethazine (PHENERGAN) 25 MG tablet Take 25 mg by mouth every 6 (six) hours as needed.      Marland Kitchen Propylene Glycol (SYSTANE BALANCE OP) Apply to eye.      Marland Kitchen QUEtiapine (SEROQUEL XR) 200 MG 24 hr tablet Take 150 mg by mouth daily after supper.       .  traMADol (ULTRAM) 50 MG tablet Take 1-2 two times a day as needed for knee pain  60 tablet  1  . traZODone (DESYREL) 100 MG tablet Take 50 mg by mouth at bedtime.       . ziprasidone (GEODON) 40 MG capsule Take by mouth. Taking 40 at dinner      . ziprasidone (GEODON) 80 MG capsule Take 80 mg by mouth at bedtime. Taking 80 mg at bedtime plus 20 mg dose at breakfast      . DISCONTD: budesonide-formoterol (SYMBICORT) 160-4.5 MCG/ACT inhaler Inhale 2 puffs into the lungs 2 (two) times daily.  1 Inhaler  6  . DISCONTD: omeprazole (PRILOSEC) 20 MG capsule Take 20 mg by mouth daily.        . Mometasone Furo-Formoterol Fum (DULERA) 200-5 MCG/ACT AERO Inhale 2 puffs into the lungs 2 (two) times daily.  1 Inhaler  1  . Probiotic Product (ALIGN PO) Take by mouth. Patient unsure of dose

## 2011-11-27 NOTE — Assessment & Plan Note (Signed)
Severe persistent asthma with recent exacerbation now resolved Concern oral for level of control asthma on Symbicort 160 Reflux likely playing a significant role Plan Stop Symbicort Trial Dulera 200 two puff twice daily No other medication changes Increase omeprazole to twice daily Return 3 weeks for recheck

## 2011-12-02 ENCOUNTER — Ambulatory Visit (INDEPENDENT_AMBULATORY_CARE_PROVIDER_SITE_OTHER): Payer: Medicare Other | Admitting: Family Medicine

## 2011-12-02 ENCOUNTER — Encounter: Payer: Self-pay | Admitting: Family Medicine

## 2011-12-02 DIAGNOSIS — F509 Eating disorder, unspecified: Secondary | ICD-10-CM

## 2011-12-02 DIAGNOSIS — E119 Type 2 diabetes mellitus without complications: Secondary | ICD-10-CM

## 2011-12-02 NOTE — Patient Instructions (Addendum)
-   To best manage your weight you will need to eat consistently and get back to regular exercise once you feel well enough.    - Meanwhile, try especially to get at least vegetables and protein at both lunch and dinner, and protein and carb at breakfast.   - Each day:  Write down (and post on refrigerator) your food goal for the day, i.e., Eat at 8 AM, 1 PM, and 6 PM or Consume protein at least 3 times today. - Instead of keeping food records, write down how you did with respect to meeting goals of the day, and why/why not?   - Please schedule at the front desk for 10 AM on Nov 11.

## 2011-12-02 NOTE — Progress Notes (Signed)
Medical Nutrition Therapy:  Appt start time: 1000 end time:  1030.  Assessment:  Primary concerns today: Elevated blood glucose (pre-DM) levels and eating disorder. Becky [name] has been sick with upper respiratory infection and/or asthma.  She has good days & bad days, including some when she feels so bad she cannot eat much.  She has not been keeping food records.  She has been working on some "old stuff" in therapy with both therapist Aurea Graff and psychiatrist Phillip Heal, which has resulted in more severe depression.    24-hr recall suggests intake of <600 kcal:  B (9 AM)-  1/2 c All Bran, 1/2 c soy milk Snk ( AM)-  none L ( PM)-  2 c Progresso veg soup Snk ( PM)-  none D ( PM)-  Soy burger w/ mozzarella, water Snk ( PM)-  water BG has been running low w/ low intake, 80s-90s (127 one day when she did eat).    Progress:  In Progress.    Nutritional Diagnosis:  No progress noted on NB-1.5 Disordered eating pattern as related to weight anxiety as evidenced by continued consistent eating pattern and balanced meals, complicated now by illness.      Intervention:  Nutrition counseling.     Monitoring/Evaluation:  Dietary intake, body weight, BG, and physical activity in 3 weeks.

## 2011-12-17 ENCOUNTER — Ambulatory Visit (INDEPENDENT_AMBULATORY_CARE_PROVIDER_SITE_OTHER): Payer: Medicare Other | Admitting: Adult Health

## 2011-12-17 ENCOUNTER — Encounter: Payer: Self-pay | Admitting: Adult Health

## 2011-12-17 ENCOUNTER — Ambulatory Visit (INDEPENDENT_AMBULATORY_CARE_PROVIDER_SITE_OTHER)
Admission: RE | Admit: 2011-12-17 | Discharge: 2011-12-17 | Disposition: A | Discharge status: Home / Self Care | Payer: Medicare Other | Source: Ambulatory Visit | Attending: Adult Health | Admitting: Adult Health

## 2011-12-17 VITALS — BP 118/78 | HR 82 | Temp 97.7°F | Ht 60.5 in | Wt 161.6 lb

## 2011-12-17 DIAGNOSIS — J45909 Unspecified asthma, uncomplicated: Secondary | ICD-10-CM

## 2011-12-17 MED ORDER — PREDNISONE 10 MG PO TABS: ORAL_TABLET | ORAL | Status: DC

## 2011-12-17 MED ORDER — ALBUTEROL SULFATE (2.5 MG/3ML) 0.083% IN NEBU: 2.5000 mg | INHALATION_SOLUTION | Freq: Four times a day (QID) | RESPIRATORY_TRACT | Status: DC | PRN

## 2011-12-17 NOTE — Addendum Note (Signed)
Addended by: Boone Master E on: 12/17/2011 11:42 AM   Modules accepted: Orders

## 2011-12-17 NOTE — Progress Notes (Signed)
  Subjective:    Patient ID: Becky [name], female    DOB: [birth year], 56 y.o.   MRN: [id number]  HPI 56 yo with a known history of asthma Former smoker  12/17/2011 Followup Patient returns for a two-week followup. Patient was seen 54 weeks ago & resolved asthmatic flare. She was changed over from Symbicort to [facility] .  She returns today reporting that she does not feeling any better. She continues to have intermittent asthma attacks requiring increased albuterol use. She does report to me today, that she's been having increased asthmatic symptoms with cough and wheezing. Over the last 4 weeks. This seems to have developed shortly after getting a CAT She does have some intermittent drippy nose, and drainage. The kitten has been in her bedroom and on her bed. She denies any fever or discolored mucus, chest pain, orthopnea, PND, or leg swelling   Review of Systems Constitutional:   No  weight loss, night sweats,  Fevers, chills, fatigue, or  lassitude.  HEENT:   No headaches,  Difficulty swallowing,  Tooth/dental problems, or  Sore throat,                No sneezing, itching, ear ache,  +nasal congestion, post nasal drip,   CV:  No chest pain,  Orthopnea, PND, swelling in lower extremities, anasarca, dizziness, palpitations, syncope.   GI  No heartburn, indigestion, abdominal pain, nausea, vomiting, diarrhea, change in bowel habits, loss of appetite, bloody stools.   Resp:    No coughing up of blood.    No chest wall deformity  Skin: no rash or lesions.  GU: no dysuria, change in color of urine, no urgency or frequency.  No flank pain, no hematuria   MS:  No joint pain or swelling.  No decreased range of motion.  No back pain.  Psych:  No change in mood or affect. No depression or anxiety.  No memory loss.         Objective:   Physical Exam GEN: A/Ox3; pleasant , NAD, well nourished   HEENT:  [employer]/AT,  EACs-clear, TMs-wnl, NOSE-clear, THROAT-clear, no lesions, no  postnasal drip or exudate noted.   NECK:  Supple w/ fair ROM; no JVD; normal carotid impulses w/o bruits; no thyromegaly or nodules palpated; no lymphadenopathy.  RESP  expiratory wheezes bilaterally no accessory muscle use, no dullness to percussion  CARD:  RRR, no m/r/g  , no peripheral edema, pulses intact, no cyanosis or clubbing.  GI:   Soft & nt; nml bowel sounds; no organomegaly or masses detected.  Musco: Warm bil, no deformities or joint swelling noted.   Neuro: alert, no focal deficits noted.    Skin: Warm, no lesions or rashes         Assessment & Plan:

## 2011-12-17 NOTE — Patient Instructions (Addendum)
Prednisone taper over next week.  Trial away from exposure to cat .  Change pillows.  Have bedroom dusted and cleaned room /bedding Continue on Dulera 2 puffs Twice daily   Continue on zyrtec daily  Saline nasal rinses As needed   follow up Dr. Delford Field  In 1 month and As needed   Please contact office for sooner follow up if symptoms do not improve or worsen or seek emergency care

## 2011-12-17 NOTE — Assessment & Plan Note (Signed)
Slow to resolve flare with possible trigger -cat  We discussed in detail trial of being away from the cat  Check cxr today   Plan  Prednisone taper over next week.  Trial away from exposure to cat .  Change pillows.  Have bedroom dusted and cleaned room /bedding Continue on Dulera 2 puffs Twice daily   Continue on zyrtec daily  Saline nasal rinses As needed   follow up Dr. Delford Field  In 1 month and As needed   Please contact office for sooner follow up if symptoms do not improve or worsen or seek emergency care

## 2011-12-18 ENCOUNTER — Other Ambulatory Visit: Payer: Self-pay | Admitting: *Deleted

## 2011-12-18 MED ORDER — ALBUTEROL SULFATE (2.5 MG/3ML) 0.083% IN NEBU: 2.5000 mg | INHALATION_SOLUTION | Freq: Four times a day (QID) | RESPIRATORY_TRACT | Status: DC | PRN

## 2011-12-18 NOTE — Progress Notes (Signed)
Quick Note:  Called spoke with patient, advised of cxr results / recs as stated by TP. Pt verbalized her understanding and denied any questions. ______ 

## 2011-12-20 ENCOUNTER — Other Ambulatory Visit: Payer: Self-pay | Admitting: *Deleted

## 2011-12-20 MED ORDER — ALBUTEROL SULFATE HFA 108 (90 BASE) MCG/ACT IN AERS: 2.0000 | INHALATION_SPRAY | Freq: Four times a day (QID) | RESPIRATORY_TRACT | Status: DC | PRN

## 2011-12-23 ENCOUNTER — Ambulatory Visit (INDEPENDENT_AMBULATORY_CARE_PROVIDER_SITE_OTHER): Payer: Medicare Other | Admitting: Family Medicine

## 2011-12-23 ENCOUNTER — Encounter: Payer: Self-pay | Admitting: Family Medicine

## 2011-12-23 DIAGNOSIS — E119 Type 2 diabetes mellitus without complications: Secondary | ICD-10-CM

## 2011-12-23 DIAGNOSIS — F509 Eating disorder, unspecified: Secondary | ICD-10-CM

## 2011-12-23 NOTE — Progress Notes (Signed)
Medical Nutrition Therapy:  Appt start time: 1000 end time:  1030.  Assessment:  Primary concerns today: Elevated blood glucose (pre-DM) levels and eating disorder. Becky [name] has experimented with limiting her exposure to her kitten, but only by not touching the cat, and by keeping it out of her bedroom.  She will take the kitten to her neighbor for a week following cleaning her condo on Wed.  Becky [name] is now on a prednisone taper, which has helped her to feel better, but in general, she has felt so bad this has affected her eating.  She was suicidal last week, but did not call anyone.  This was in response to having her feelings hurt by her mother and sister. She denies suicidal ideations at this time, and promised she would talk to her psychiatrist this week, and therapist next week.   Becky [name] wrote about her efforts at meeting her goals, but did not bring it today b/c she did not get a chance to copy it over to make more legible.  I assured her this was unneccesary.  One of the things Becky [name] learned from keeping this journal was how what goes on in her day affects food choices.  She said another thing she learned was that, "I can be useful sometimes."  Recent diet history includes numerous skipped meals.  Currently Becky [name]'s food supplies are very low, but she received a disability check last week, and plans to shop soon.    Progress:  In Progress.    Nutritional Diagnosis:  No progress noted on NB-1.5 Disordered eating pattern as related to weight anxiety as evidenced by continued inconsistent eating pattern, complicated now by illness.      Intervention:  Nutrition counseling.     Monitoring/Evaluation:  Dietary intake, body weight, BG, and physical activity in 3 weeks.

## 2011-12-23 NOTE — Patient Instructions (Addendum)
-   Continue to keep a journal about your efforts at meeting your behavioral goals: - Goals:    1. 3 meals a day.   2. Veg's 2 X day.   3. Protein with each meals.   4. Consistency - Veg's that keep well (& are inexpensive):  - Carrots (not the baby carrots)  - Cabbage  - Sweet potatoes  - Turnips  - Onions, garlic  - All frozen veg's - Discuss your depression with Erskine Squibb this week, and with Aurea Graff next week.   - Please schedule 30-min appts for Dec 2 & 23 at 10:30 AM.

## 2011-12-25 ENCOUNTER — Telehealth: Payer: Self-pay | Admitting: Critical Care Medicine

## 2011-12-25 NOTE — Telephone Encounter (Signed)
[facility].00. I advised this was at the end of sig. Per the pharmacists this did not show up on RX they received.  Nothing further was needed

## 2012-01-03 ENCOUNTER — Telehealth: Payer: Self-pay | Admitting: Critical Care Medicine

## 2012-01-03 MED ORDER — ALBUTEROL SULFATE (2.5 MG/3ML) 0.083% IN NEBU: 2.5000 mg | INHALATION_SOLUTION | Freq: Four times a day (QID) | RESPIRATORY_TRACT | Status: DC | PRN

## 2012-01-03 NOTE — Telephone Encounter (Signed)
Called and spoke with pt and she stated that the pharmacy needed the dx code.  i called the pharmacy and gave this verbally to them so they could get this filled for the pt and they requested that we send over rx for this as well with the dx code.  Pharmacy is aware and nothing further is needed.

## 2012-01-13 ENCOUNTER — Ambulatory Visit (INDEPENDENT_AMBULATORY_CARE_PROVIDER_SITE_OTHER): Payer: Medicare Other | Admitting: Family Medicine

## 2012-01-13 ENCOUNTER — Encounter: Payer: Self-pay | Admitting: Family Medicine

## 2012-01-13 DIAGNOSIS — E119 Type 2 diabetes mellitus without complications: Secondary | ICD-10-CM

## 2012-01-13 DIAGNOSIS — F509 Eating disorder, unspecified: Secondary | ICD-10-CM

## 2012-01-13 NOTE — Patient Instructions (Addendum)
-   Each day, plan tomorrow's meals:  What, when, & where.   - Continue to journal what you ate, and if not, why not.   - Come up with at least 3 projects that would be fulfilling.  Email this list to Becky [name] no later than Friday.    - No later than Fri, Dec 13, email Becky [name] to let me know which project(s) you have started on.   - Dietary goals:    1. Protein at each meal.    2. At least 3 meals a day.   3. Veg's at least 2 X day.   4. CONSISTENCY.

## 2012-01-13 NOTE — Progress Notes (Signed)
Medical Nutrition Therapy:  Appt start time: 1030 end time:  1100.  Assessment:  Primary concerns today: Elevated blood glucose (pre-DM) levels and eating disorder. Kanitra's depression has continued to be pretty severe, although she did enjoy Thanksgiving with her family last week.  Jaelyne realized she worked well with her sister getting ready for Th'giving, caring for her nephew, etc, but even this good feeling did not last once she was back home by herself.  She believes her current asthma condition is contributing to her depression.  She will see Pulmonologist Shan Levans on Friday.   When asked what she would really like to achieve for herself, Caci said she'd like to lose weight, have energy, travel, eat well, and to be able to exercise as desired.  Further discussion led to the obvious conclusion that to lose weight and have more energy, she must eat well.   Kamauri admitted that she has been skipping meals, confirmed by yesterday's recall:    (Up at 9 AM, feeling exhausted) B ( AM)-  water Snk ( AM)-   L ( PM)-   Snk ( PM)-   D (6:30 PM)-  ([place]) veg burger, baked potato w/ cheese, water Snk ( PM)-  none  Progress:  In Progress.    Nutritional Diagnosis:  No progress noted on NB-1.5 Disordered eating pattern as related to weight anxiety as evidenced by continued inconsistent eating pattern.      Intervention:  Nutrition counseling.     Monitoring/Evaluation:  Dietary intake, body weight, BG, and physical activity in 3 weeks.

## 2012-01-17 ENCOUNTER — Ambulatory Visit (INDEPENDENT_AMBULATORY_CARE_PROVIDER_SITE_OTHER): Payer: Medicare Other | Admitting: Critical Care Medicine

## 2012-01-17 ENCOUNTER — Encounter: Payer: Self-pay | Admitting: Critical Care Medicine

## 2012-01-17 VITALS — BP 108/72 | HR 71 | Temp 97.4°F | Ht 64.0 in | Wt 164.0 lb

## 2012-01-17 DIAGNOSIS — F4481 Dissociative identity disorder: Secondary | ICD-10-CM | POA: Insufficient documentation

## 2012-01-17 DIAGNOSIS — J45909 Unspecified asthma, uncomplicated: Secondary | ICD-10-CM

## 2012-01-17 HISTORY — DX: Dissociative identity disorder: F44.81

## 2012-01-17 NOTE — Progress Notes (Signed)
Subjective:    Patient ID: Becky [name], female    DOB: 08-Jan-1956, 56 y.o.   MRN: [id number]  HPI  56 yo with a known history of asthma Former smoker  11/5 Followup Patient returns for a two-week followup. Patient was seen 2 weeks ago & resolved asthmatic flare. She was changed over from Symbicort to [facility] .  She returns today reporting that she does not feeling any better. She continues to have intermittent asthma attacks requiring increased albuterol use. She does report to me today, that she's been having increased asthmatic symptoms with cough and wheezing. Over the last 4 weeks. This seems to have developed shortly after getting a CAT She does have some intermittent drippy nose, and drainage. The kitten has been in her bedroom and on her bed. She denies any fever or discolored mucus, chest pain, orthopnea, PND, or leg swelling  01/17/2012 Pt is better. On dulera. Off prednisone.   No new issues. Pt denies any significant sore throat, nasal congestion or excess secretions, fever, chills, sweats, unintended weight loss, pleurtic or exertional chest pain, orthopnea PND, or leg swelling Pt denies any increase in rescue therapy over baseline, denies waking up needing it or having any early am or nocturnal exacerbations of coughing/wheezing/or dyspnea. Pt also denies any obvious fluctuation in symptoms with  weather or environmental change or other alleviating or aggravating factors  PUL ASTHMA HISTORY 01/19/2012 08/14/2011 04/10/2011  Symptoms 0-2 days/week >2 days/week Daily  Nighttime awakenings 0-2/month 0-2/month Often--7/wk  Interference with activity No limitations Minor limitations Some limitations  SABA use 0-2 days/wk 0-2 days/wk Daily  Exacerbations requiring oral steroids 0-1 / year 0-1 / year 2 or more / year     Review of Systems  Constitutional:   No  weight loss, night sweats,  Fevers, chills, fatigue, or  lassitude.  HEENT:   No headaches,  Difficulty  swallowing,  Tooth/dental problems, or  Sore throat,                No sneezing, itching, ear ache,  +nasal congestion, post nasal drip,   CV:  No chest pain,  Orthopnea, PND, swelling in lower extremities, anasarca, dizziness, palpitations, syncope.   GI  No heartburn, indigestion, abdominal pain, nausea, vomiting, diarrhea, change in bowel habits, loss of appetite, bloody stools.   Resp:    No coughing up of blood.    No chest wall deformity  Skin: no rash or lesions.  GU: no dysuria, change in color of urine, no urgency or frequency.  No flank pain, no hematuria   MS:  No joint pain or swelling.  No decreased range of motion.  No back pain.  Psych:  No change in mood or affect. No depression or anxiety.  No memory loss.         Objective:   Physical Exam BP 108/72  Pulse 71  Temp 97.4 F (36.3 C) (Oral)  Ht 5\' 4"  (1.626 m)  Wt 164 lb (74.39 kg)  BMI 28.15 kg/m2  SpO2 97%  GEN: A/Ox3; pleasant , NAD, well nourished   HEENT:  /AT,  EACs-clear, TMs-wnl, NOSE-clear, THROAT-clear, no lesions, no postnasal drip or exudate noted.   NECK:  Supple w/ fair ROM; no JVD; normal carotid impulses w/o bruits; no thyromegaly or nodules palpated; no lymphadenopathy.  RESP  expiratory wheezes bilaterally no accessory muscle use, no dullness to percussion  CARD:  RRR, no m/r/g  , no peripheral edema, pulses intact, no cyanosis or clubbing.  GI:   Soft & nt; nml bowel sounds; no organomegaly or masses detected.  Musco: Warm bil, no deformities or joint swelling noted.   Neuro: alert, no focal deficits noted.    Skin: Warm, no lesions or rashes         Assessment & Plan:   Severe persistent asthma with atopic and reflux precipitating factors Severe persistent asthma stable at this time Plan No change in inhaled or maintenance medications. Return in  4 months    Updated Medication List Outpatient Encounter Prescriptions as of 01/17/2012  Medication Sig Dispense  Refill  . albuterol (PROAIR HFA) 108 (90 BASE) MCG/ACT inhaler Inhale 2 puffs into the lungs every 6 (six) hours as needed.  1 Inhaler  6  . albuterol (PROVENTIL) (2.5 MG/3ML) 0.083% nebulizer solution Take 3 mLs (2.5 mg total) by nebulization every 6 (six) hours as needed. DX:  493.00 FILE WITH MCR PART B  300 mL  5  . benztropine (COGENTIN) 0.5 MG tablet Take 1 tablet by mouth at bedtime.      Marland Kitchen buPROPion (WELLBUTRIN XL) 150 MG 24 hr tablet Take 3 tablets by mouth daily to = 450mg       . cetirizine (ZYRTEC) 10 MG tablet Take 10 mg by mouth every morning.        . Cholecalciferol 50000 UNIT capsule Take 50,000 Units by mouth as directed. Take 1 every week      . clonazePAM (KLONOPIN) 0.5 MG tablet Take by mouth. 1 tablet (0.5 mg) with breakfast, lunch, and dinner and 3 tablets (1.5mg ) at bedtime      . DULoxetine (CYMBALTA) 30 MG capsule Take 3 pills in AM for total of 90 mg.      . DULoxetine (CYMBALTA) 60 MG capsule Take 60 mg by mouth. Taken at lunch time.      . eszopiclone (LUNESTA) 2 MG TABS Take 2 mg by mouth at bedtime. Take immediately before bedtime       . fluticasone (FLONASE) 50 MCG/ACT nasal spray 2 sprays daily.        Marland Kitchen gabapentin (NEURONTIN) 300 MG capsule Take 300 mg by mouth 4 (four) times daily.       Marland Kitchen ibuprofen (ADVIL,MOTRIN) 600 MG tablet Take 600 mg by mouth 2 (two) times daily as needed. 08/20/11 per pt, taking in morning and at night per MD instructions      . L-Methylfolate (DEPLIN) 15 MG TABS Take 1 tablet by mouth daily.       Marland Kitchen lidocaine (LIDODERM) 5 % Place 1 patch onto the skin daily as needed. Remove & Discard patch within 12 hours or as directed by MD      . losartan (COZAAR) 50 MG tablet Take 50 mg by mouth daily.      . metFORMIN (GLUCOPHAGE) 500 MG tablet Take 500 mg by mouth daily after breakfast.      . Mometasone Furo-Formoterol Fum (DULERA) 200-5 MCG/ACT AERO Inhale 2 puffs into the lungs 2 (two) times daily.  1 Inhaler  1  . montelukast (SINGULAIR) 10 MG  tablet Take 1 tablet by mouth Daily.      . Multiple Vitamins-Minerals ([facility] DAILY MULTIPLE WOMEN 50+ PO) Take by mouth daily.        Marland Kitchen omeprazole (PRILOSEC) 20 MG capsule Take 1 capsule (20 mg total) by mouth 2 (two) times daily.  60 capsule  6  . promethazine (PHENERGAN) 25 MG tablet Take 25 mg by mouth every 6 (six) hours as needed.      Marland Kitchen  Propylene Glycol (SYSTANE BALANCE OP) Apply to eye.      Marland Kitchen QUEtiapine Fumarate (SEROQUEL XR) 150 MG 24 hr tablet Take 150 mg by mouth at bedtime.      . traMADol (ULTRAM) 50 MG tablet Take 1-2 two times a day as needed for knee pain  60 tablet  1  . traZODone (DESYREL) 100 MG tablet Take 50 mg by mouth at bedtime.       . ziprasidone (GEODON) 20 MG capsule Take 20 mg by mouth every morning.      . ziprasidone (GEODON) 40 MG capsule Take by mouth. Taking 40 at dinner      . ziprasidone (GEODON) 80 MG capsule Take 80 mg by mouth at bedtime. Taking 80 mg at bedtime      . [DISCONTINUED] clonazePAM (KLONOPIN) 1 MG tablet Take 0.5 mg by mouth. 0.5 mg with meals (3 X day) and 1.5 mg at bedtime.      . [DISCONTINUED] Probiotic Product (ALIGN PO) Take by mouth. Patient unsure of dose      . [DISCONTINUED] QUEtiapine (SEROQUEL XR) 200 MG 24 hr tablet Take 150 mg by mouth daily after supper.

## 2012-01-17 NOTE — Patient Instructions (Addendum)
No change in medications. Return in         4 months 

## 2012-01-19 NOTE — Assessment & Plan Note (Signed)
Severe persistent asthma stable at this time Plan No change in inhaled or maintenance medications. Return in  4 months

## 2012-02-03 ENCOUNTER — Ambulatory Visit (INDEPENDENT_AMBULATORY_CARE_PROVIDER_SITE_OTHER): Payer: Medicare Other | Admitting: Family Medicine

## 2012-02-03 ENCOUNTER — Encounter: Payer: Self-pay | Admitting: Family Medicine

## 2012-02-03 ENCOUNTER — Ambulatory Visit: Payer: Medicare Other | Admitting: Family Medicine

## 2012-02-03 DIAGNOSIS — F509 Eating disorder, unspecified: Secondary | ICD-10-CM

## 2012-02-03 DIAGNOSIS — E119 Type 2 diabetes mellitus without complications: Secondary | ICD-10-CM

## 2012-02-03 NOTE — Patient Instructions (Addendum)
Same recommendations as last visit: - Each day, plan tomorrow's meals: What, when, & where.  - Continue to journal what you ate, and if not, why not.   - Dietary goals:  1. Protein at each meal.  2. At least 3 meals a day.  3. Veg's at least 2 X day.  4. CONSISTENCY.   - Make PLANNING a priority.    - F/U appts:   1/21 at 10:30   2/10 at 10:30   3/3 at 11:30    3/24 at 10:30

## 2012-02-03 NOTE — Progress Notes (Signed)
Medical Nutrition Therapy:  Appt start time: 1130 end time:  1200.  Assessment:  Primary concerns today: Elevated blood glucose (pre-DM) levels and eating disorder. Becky [name] has made bread for Christmas presents, which has been one of her "creative projects" that she has felt has been beneficial to her, mostly b/c she has done each of her Christmas projects with her sister Becky [name].  She will have brunch at her parents' house on Christmas day with family members, and has planned enough so she knows there will be foods she will eat.  Becky [name] said she did not do much meal planning, as discussed at last appt.  She said her depression has gotten in the way, but she really wants to start the new year right w/ respect to food, so is committed to the recommended changes discussed at last visit.  Besides the depression, an obstacle to better food choices is lack of money, i.e., she has no yogurt/cereal/breakfast foods at home now other than oatmeal.    Progress:  In Progress.    Nutritional Diagnosis:  No progress noted on NB-1.5 Disordered eating pattern as related to weight anxiety as evidenced by continued inconsistent eating pattern.      Intervention:  Nutrition counseling.     Monitoring/Evaluation:  Dietary intake, body weight, BG, and physical activity in 4 weeks.

## 2012-02-10 ENCOUNTER — Telehealth: Payer: Self-pay | Admitting: Critical Care Medicine

## 2012-02-10 MED ORDER — DOXYCYCLINE HYCLATE 100 MG PO TABS: 100.0000 mg | ORAL_TABLET | Freq: Two times a day (BID) | ORAL | Status: DC

## 2012-02-10 NOTE — Telephone Encounter (Signed)
Change to doxy 100 x 7ds

## 2012-02-10 NOTE — Telephone Encounter (Signed)
Pt calling again in ref to previous msg can be reached at [phone].Becky [name]

## 2012-02-10 NOTE — Telephone Encounter (Signed)
Spoke with pt She is c/o increased cough /congestion for the past 4 days Cough is prod, but she is unsure of color of sputum She has had some tightness in her chest also Denies any increased SOB, CP, fever, wheezing Will forward to doc of the day Please advise RA, thanks! Allergies  Allergen Reactions  . Aspirin     REACTION: Gi upset  . Clarithromycin     REACTION: rash,fever  . Codeine Nausea And Vomiting  . Methadone     REACTION: respiratory problems, chest pain  . Ms Contin (Morphine Sulfate)     CP and SOB  . Nitrofurantoin     REACTION: respiratory problems, chest pains  . Penicillins     REACTION: rash  . Percocet (Oxycodone-Acetaminophen)     CP and SOB  . Amoxicillin Rash

## 2012-02-10 NOTE — Telephone Encounter (Signed)
z-pak mucinex 600 bid Oral fluids to hydrate

## 2012-02-10 NOTE — Telephone Encounter (Signed)
Spoke with pt and notified of recs per RA She verbalized understanding and states no questions Rx was sent to pharm

## 2012-02-10 NOTE — Telephone Encounter (Signed)
Called spoke with patient, advised of recs as stated by RA below.  Pt verbalized her understanding.  However, when attempting to send the zpak, an allergy/contraindication for the zpak vs. Clarithromycin warning came up.    Pt's allergy to clarithromycin is rash/fever.  Dr Vassie Loll please advise, thanks.

## 2012-02-19 ENCOUNTER — Encounter: Payer: Self-pay | Admitting: Adult Health

## 2012-02-19 ENCOUNTER — Ambulatory Visit (INDEPENDENT_AMBULATORY_CARE_PROVIDER_SITE_OTHER): Payer: Medicare Other | Admitting: Adult Health

## 2012-02-19 VITALS — BP 110/72 | HR 79 | Temp 97.0°F | Ht 65.5 in | Wt 161.0 lb

## 2012-02-19 DIAGNOSIS — J45909 Unspecified asthma, uncomplicated: Secondary | ICD-10-CM

## 2012-02-19 NOTE — Patient Instructions (Addendum)
No change in medications. Return in     3 months with Dr. Delford Field

## 2012-02-19 NOTE — Progress Notes (Signed)
Subjective:    Patient ID: Becky [name], female    DOB: [birth year], 57 y.o.   MRN: [id number]  Asthma Her past medical history is significant for asthma.   57 yo with a known history of asthma Former smoker  11/5 Followup Patient returns for a two-week followup. Patient was seen 2 weeks ago & resolved asthmatic flare. She was changed over from Symbicort to [facility] .  She returns today reporting that she does not feeling any better. She continues to have intermittent asthma attacks requiring increased albuterol use. She does report to me today, that she's been having increased asthmatic symptoms with cough and wheezing. Over the last 4 weeks. This seems to have developed shortly after getting a CAT She does have some intermittent drippy nose, and drainage. The kitten has been in her bedroom and on her bed. She denies any fever or discolored mucus, chest pain, orthopnea, PND, or leg swelling  01/17/2012 Pt is better. On dulera. Off prednisone.   No new issues.   PUL ASTHMA HISTORY 01/19/2012 08/14/2011 04/10/2011  Symptoms 0-2 days/week >2 days/week Daily  Nighttime awakenings 0-2/month 0-2/month Often--7/wk  Interference with activity No limitations Minor limitations Some limitations  SABA use 0-2 days/wk 0-2 days/wk Daily  Exacerbations requiring oral steroids 0-1 / year 0-1 / year 2 or more / year   02/19/2012 Follow up  Patient returns for a one-week followup for recent asthma attack and upper respiratory infection Patient complains that she developed a cough, congestion, over the last 2 weeks and on December 30,  was called in doxycycline x7 days. Patient reports his symptoms are improved with decreased cough and congestion. Wheezing and shortness of breath. Also improved. She denies any hemoptysis, orthopnea, PND, or leg swelling   Review of Systems  Constitutional:   No  weight loss, night sweats,  Fevers, chills, fatigue, or  lassitude.  HEENT:   No headaches,   Difficulty swallowing,  Tooth/dental problems, or  Sore throat,                No sneezing, itching, ear ache,  +nasal congestion, post nasal drip,   CV:  No chest pain,  Orthopnea, PND, swelling in lower extremities, anasarca, dizziness, palpitations, syncope.   GI  No heartburn, indigestion, abdominal pain, nausea, vomiting, diarrhea, change in bowel habits, loss of appetite, bloody stools.   Resp:    No coughing up of blood.    No chest wall deformity  Skin: no rash or lesions.  GU: no dysuria, change in color of urine, no urgency or frequency.  No flank pain, no hematuria   MS:  No joint pain or swelling.  No decreased range of motion.  No back pain.  Psych:  No change in mood or affect. No depression or anxiety.  No memory loss.         Objective:   Physical Exam BP 110/72  Pulse 79  Temp 97 F (36.1 C) (Oral)  Ht 5' 5.5" (1.664 m)  Wt 161 lb (73.029 kg)  BMI 26.38 kg/m2  SpO2 99%  GEN: A/Ox3; pleasant , NAD, well nourished   HEENT:  [place]/AT,  EACs-clear, TMs-wnl, NOSE-clear, THROAT-clear, no lesions, no postnasal drip or exudate noted.   NECK:  Supple w/ fair ROM; no JVD; normal carotid impulses w/o bruits; no thyromegaly or nodules palpated; no lymphadenopathy.  RESP  Diminished BS in bases , no accessory muscle use, no dullness to percussion  CARD:  RRR, no m/r/g  , no  peripheral edema, pulses intact, no cyanosis or clubbing.  GI:   Soft & nt; nml bowel sounds; no organomegaly or masses detected.  Musco: Warm bil, no deformities or joint swelling noted.   Neuro: alert, no focal deficits noted.    Skin: Warm, no lesions or rashes         Assessment & Plan:   No problem-specific assessment & plan notes found for this encounter.   Updated Medication List Outpatient Encounter Prescriptions as of 02/19/2012  Medication Sig Dispense Refill  . albuterol (PROAIR HFA) 108 (90 BASE) MCG/ACT inhaler Inhale 2 puffs into the lungs every 6 (six) hours as needed.   1 Inhaler  6  . albuterol (PROVENTIL) (2.5 MG/3ML) 0.083% nebulizer solution Take 3 mLs (2.5 mg total) by nebulization every 6 (six) hours as needed. DX:  493.00 FILE WITH MCR PART B  300 mL  5  . benztropine (COGENTIN) 0.5 MG tablet Take 1 tablet by mouth at bedtime.      Marland Kitchen buPROPion (WELLBUTRIN XL) 150 MG 24 hr tablet Take 3 tablets by mouth daily to = 450mg       . cetirizine (ZYRTEC) 10 MG tablet Take 10 mg by mouth every morning.        . Cholecalciferol 50000 UNIT capsule Take 50,000 Units by mouth as directed. Take 1 every week      . clonazePAM (KLONOPIN) 0.5 MG tablet Take 0.5 mg by mouth 4 (four) times daily.       . DULoxetine (CYMBALTA) 30 MG capsule Take 30 mg by mouth daily.       . DULoxetine (CYMBALTA) 60 MG capsule Take 60 mg by mouth. Taken at lunch time.      . eszopiclone (LUNESTA) 2 MG TABS Take 2 mg by mouth at bedtime. Take immediately before bedtime       . fluticasone (FLONASE) 50 MCG/ACT nasal spray 2 sprays daily.        Marland Kitchen gabapentin (NEURONTIN) 300 MG capsule Take 300 mg by mouth 4 (four) times daily.       Marland Kitchen ibuprofen (ADVIL,MOTRIN) 600 MG tablet Take 600 mg by mouth 2 (two) times daily as needed. 08/20/11 per pt, taking in morning and at night per MD instructions      . L-Methylfolate (DEPLIN) 15 MG TABS Take 1 tablet by mouth daily.       Marland Kitchen lidocaine (LIDODERM) 5 % Place 1 patch onto the skin daily as needed. Remove & Discard patch within 12 hours or as directed by MD      . losartan (COZAAR) 50 MG tablet Take 50 mg by mouth daily.      . metFORMIN (GLUCOPHAGE) 500 MG tablet Take 500 mg by mouth daily after breakfast.      . Mometasone Furo-Formoterol Fum (DULERA) 200-5 MCG/ACT AERO Inhale 2 puffs into the lungs 2 (two) times daily.  1 Inhaler  1  . montelukast (SINGULAIR) 10 MG tablet Take 1 tablet by mouth Daily.      . Multiple Vitamins-Minerals ([facility] DAILY MULTIPLE WOMEN 50+ PO) Take by mouth daily.        Marland Kitchen omeprazole (PRILOSEC) 20 MG capsule Take 1 capsule (20 mg  total) by mouth 2 (two) times daily.  60 capsule  6  . promethazine (PHENERGAN) 25 MG tablet Take 25 mg by mouth every 6 (six) hours as needed.      Marland Kitchen Propylene Glycol (SYSTANE BALANCE OP) Apply to eye.      Marland Kitchen QUEtiapine Fumarate (  SEROQUEL XR) 150 MG 24 hr tablet Take 150 mg by mouth at bedtime.      . traMADol (ULTRAM) 50 MG tablet Take 1-2 two times a day as needed for knee pain  60 tablet  1  . traZODone (DESYREL) 50 MG tablet Take 50 mg by mouth at bedtime.      . ziprasidone (GEODON) 20 MG capsule Take 20 mg by mouth every morning.      . ziprasidone (GEODON) 40 MG capsule Take by mouth. Taking 40 at dinner      . ziprasidone (GEODON) 80 MG capsule Take 80 mg by mouth at bedtime. Taking 80 mg at bedtime      . [DISCONTINUED] doxycycline (VIBRA-TABS) 100 MG tablet Take 1 tablet (100 mg total) by mouth 2 (two) times daily.  14 tablet  0  . [DISCONTINUED] traZODone (DESYREL) 100 MG tablet Take 50 mg by mouth at bedtime.

## 2012-02-26 NOTE — Assessment & Plan Note (Signed)
Recent exacerbation, with upper respiratory infection, now resolved. Patient's continue her current regimen. Patient to followup as planned and as needed

## 2012-03-03 ENCOUNTER — Ambulatory Visit: Payer: Medicare Other | Admitting: Family Medicine

## 2012-03-06 ENCOUNTER — Ambulatory Visit (INDEPENDENT_AMBULATORY_CARE_PROVIDER_SITE_OTHER): Payer: Medicare Other | Admitting: Family Medicine

## 2012-03-06 VITALS — BP 118/86 | Ht 60.0 in | Wt 160.0 lb

## 2012-03-06 DIAGNOSIS — IMO0002 Reserved for concepts with insufficient information to code with codable children: Secondary | ICD-10-CM

## 2012-03-06 DIAGNOSIS — M1710 Unilateral primary osteoarthritis, unspecified knee: Secondary | ICD-10-CM

## 2012-03-06 DIAGNOSIS — M79609 Pain in unspecified limb: Secondary | ICD-10-CM

## 2012-03-06 DIAGNOSIS — M79672 Pain in left foot: Secondary | ICD-10-CM

## 2012-03-06 NOTE — Progress Notes (Signed)
  Subjective:    Patient ID: Becky [name], female    DOB: Apr 27, 1955, 57 y.o.   MRN: [id number]  HPI #1. Right knee pain. Several months ago had corticosteroid injections which really for pain significantly by Leigh 75% for 6-8 weeks. Over the last few weeks the knee on the right has been bothering her. The knee on the left has not yet bothering her. #2. Left foot pain. Feels a pulling sensation when she walks. This is on the top of her foot. No specific injury, no new shoes, no new activities. Pain is mild to moderate.   Review of Systems No knee or foot swelling. No redness of the knee or foot. No fevers.    Objective:   Physical Exam  Vital signs are reviewed GENERAL: Well-developed female no acute distress KNEE: Right. Full range of motion in flexion extension. Tender to palpation at the medial joint line. No effusion. Ligamentously intact to varus and valgus stress. Calf is soft. FOOT: Left. Nontender to palpation on the dorsum. Passive stretching of the extensor tendons causes similar discomfort what she's describing. She has loss of the transverse arch but her longitudinal arch is fairly well maintained. She's not tender to palpation on the metatarsal heads.   INJECTION: Patient was given informed consent, signed copy in the chart. Appropriate time out was taken. Area prepped and draped in usual sterile fashion. 1 cc of methylprednisolone 40 mg/ml plus  1 cc of 1% lidocaine without epinephrine was injected into the Right knee using a(n) Anterior medial approach. The patient tolerated the procedure well. There were no complications. Post procedure instructions were given.     Assessment & Plan:  #1. Osteoarthritis right knee. Corticosteroid injection today. This is really seem to help her in she's been able  To space these out nicely. #2. Extensor tendon strain on the left foot. Would have her do some marble pickups and some stretching exercises demonstrating clinic. To return  to clinic when necessary

## 2012-03-23 ENCOUNTER — Encounter: Payer: Self-pay | Admitting: Family Medicine

## 2012-03-23 ENCOUNTER — Ambulatory Visit (INDEPENDENT_AMBULATORY_CARE_PROVIDER_SITE_OTHER): Payer: Medicare Other | Admitting: Family Medicine

## 2012-03-23 DIAGNOSIS — IMO0001 Reserved for inherently not codable concepts without codable children: Secondary | ICD-10-CM

## 2012-03-23 DIAGNOSIS — F509 Eating disorder, unspecified: Secondary | ICD-10-CM

## 2012-03-23 NOTE — Patient Instructions (Addendum)
-   Make a new version of your food record, and email to [place].   Same recommendations as last visit:  - Each day, plan tomorrow's meals: What, when, & where.  - Continue to journal what you ate, and if not, why not.  - Dietary goals:  1. Protein at each meal.  2. At least 3 meals a day.  3. Veg's at least 2 X day.  4. CONSISTENCY.   - Make PLANNING a priority.
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
## 2012-04-13 ENCOUNTER — Ambulatory Visit: Payer: Medicare Other | Admitting: Family Medicine

## 2012-04-20 ENCOUNTER — Encounter: Payer: Self-pay | Admitting: Family Medicine

## 2012-04-20 ENCOUNTER — Ambulatory Visit (INDEPENDENT_AMBULATORY_CARE_PROVIDER_SITE_OTHER): Payer: Medicare Other | Admitting: Family Medicine

## 2012-04-20 DIAGNOSIS — E119 Type 2 diabetes mellitus without complications: Secondary | ICD-10-CM

## 2012-04-20 DIAGNOSIS — F509 Eating disorder, unspecified: Secondary | ICD-10-CM

## 2012-04-20 NOTE — Progress Notes (Signed)
Medical Nutrition Therapy:  Appt start time: 1030 end time:  1100.  Assessment:  Primary concerns today: Elevated blood glucose (pre-DM) levels and eating disorder. Becky [name] has not been recording food intake consistently, but she feels she has been eating better - 3 meals a day and protein at each meal.  She has been using [employer] 3-4 X wk.  She has not been checking BG lately.   24-hr recall suggests intake of ~1300 kcal:  (Up at 7 AM) B (7:30 AM)-  1 cinn-raisin bagel, 1 banana Snk ( AM)-  water L (12:15 PM)-  1 c rice, 1 oz cheese, water Snk ( PM)-  water D (7 PM)-  1-2 c Brussels sprouts, 1-2 c small potatoes, 1 c cooked onion, 1 oz cheese, 1 slc bread w/ 1tbsp margarine Snk ( PM)-  none Dinner was more than usual; she ate at her sister's house.    Progress:  In Progress.    Nutritional Diagnosis:  No progress noted on NB-1.5 Disordered eating pattern as related to weight anxiety as evidenced by continued inconsistent eating pattern.      Intervention:  Nutrition counseling.     Monitoring/Evaluation:  Dietary intake, body weight, BG, and physical activity in 4 weeks.

## 2012-04-20 NOTE — Patient Instructions (Addendum)
Diet Recommendations for Diabetes   Starchy (carb) foods include: Bread, rice, pasta, potatoes, corn, crackers, bagels, muffins, all baked goods.   Protein foods include: Meat, fish, poultry, eggs, dairy foods, and beans such as pinto and kidney beans (beans also provide carbohydrate).   1. Eat at least 3 meals and 1-2 snacks per day. Never go more than 4-5 hours while awake without eating.  2. Limit starchy foods to TWO per meal and ONE per snack. ONE portion of a starchy  food is equal to the following:   - ONE slice of bread (or its equivalent, such as half of a hamburger bun).   - 1/2 cup of a "scoopable" starchy food such as potatoes or rice.   - 1 OUNCE (28 grams) of starchy snack foods such as crackers or pretzels (look on label).   - 15 grams of carbohydrate as shown on food label.  3. Both lunch and dinner should include a protein food, a carb food, and vegetables.   - Obtain twice as many veg's as protein or carbohydrate foods for both lunch and dinner.   - Try to keep frozen veg's on hand for a quick vegetable serving.     - Fresh or frozen veg's are best.  4. Breakfast should always include protein.    - Re-start keeping your food record.   - Also start back on checking BG each day, and record on food record.

## 2012-04-24 ENCOUNTER — Telehealth: Payer: Self-pay | Admitting: *Deleted

## 2012-04-24 NOTE — Telephone Encounter (Signed)
Per Dr. Jennette Kettle- it is ok for pt to take tramadol 100 mg twice daily as needed if this helps knee pain.  Dr. Jennette Kettle offered refill, but patient said she does not need refill currently.  She could get another injection in 1 month.    Pt states pain is worse when she goes up and downstairs.  Offered physical therapy, or an appointment with sports med to go over home exercises that might help.  Pt declined referral to PT, said she would call later to make an appt with sports med to get exercises.

## 2012-04-24 NOTE — Telephone Encounter (Signed)
Message copied by Mora Bellman on Fri Apr 24, 2012  3:22 PM ------      Message from: CERESI, MELANIE L      Created: Thu Apr 23, 2012 10:30 AM      Regarding: phone message      Contact: [phone]       Pt is still having rt knee pain, she wants to know if there is anything left that Dr. Jennette Kettle can do for pain.  Pt is taking tramadol and that helps when she takes 2.  ------

## 2012-04-29 ENCOUNTER — Other Ambulatory Visit: Payer: Self-pay

## 2012-04-29 DIAGNOSIS — Z1231 Encounter for screening mammogram for malignant neoplasm of breast: Secondary | ICD-10-CM

## 2012-05-04 ENCOUNTER — Ambulatory Visit: Payer: Medicare Other | Admitting: Family Medicine

## 2012-05-18 ENCOUNTER — Ambulatory Visit (INDEPENDENT_AMBULATORY_CARE_PROVIDER_SITE_OTHER): Payer: Medicare Other | Admitting: Family Medicine

## 2012-05-18 ENCOUNTER — Encounter: Payer: Self-pay | Admitting: Family Medicine

## 2012-05-18 DIAGNOSIS — IMO0001 Reserved for inherently not codable concepts without codable children: Secondary | ICD-10-CM

## 2012-05-18 DIAGNOSIS — F509 Eating disorder, unspecified: Secondary | ICD-10-CM

## 2012-05-18 NOTE — Progress Notes (Signed)
Medical Nutrition Therapy:  Appt start time: 1030 end time:  1100.  Assessment:  Primary concerns today: Elevated blood glucose (pre-DM) levels and eating disorder. Charde has been sleeping well, now not sleeping too much since cutting back on Trazadone from 75 to 50 mg/day.  She is still managing the renovation of her parents' house, which is stressful.  There are also stresses b/c of recent discovery that her alcoholic dad has been syphoning money from the household budget to support his habit.   Xiadani has not been eating in any kind of consistent pattern.  She has been missing meals frequently, and said she just is not usually motivated to eat.  Ambert does understand that she needs to eat in order to lose weight, which she is still very focused on despite her apparent inattention to nutrition.     Recent (3/26) A1C was 5.9; HDL was 72; LDL 85; 25-OH vit D 23.1 for which Charice was prescribed 50,000 IU D2 for 12 wks.  Lab results will be scanned to e-chart.    Progress:  In Progress.    Nutritional Diagnosis:  No progress noted on NB-1.5 Disordered eating pattern as related to weight anxiety as evidenced by continued inconsistent eating pattern.      Intervention:  Nutrition counseling.     Monitoring/Evaluation:  Dietary intake, body weight, and physical activity in 4 weeks.

## 2012-05-18 NOTE — Patient Instructions (Addendum)
-   Groceries:  Be sure to get sources of protein as well as frozen veg's.  - Keep in mind you will do best if you have foods that make eating convenient.  - Goals:  1. Breakfast daily.  This means making sure you have the foods on hand:  Yogurt, oatmeal, cereal, eggs, cheese, peanut butter, bread.   2. Plan each evening for the next day:  When, where, & what of meals, i.e., When can I break for lunch tomorrow?; Where will I be at that mealtime?; What can I have?  3. Vegetables twice a day; protein with every meal.   - Please schedule an appt for June 2 at 10:30 (in addn to your May 5 appt).

## 2012-05-27 ENCOUNTER — Ambulatory Visit: Payer: Medicare Other

## 2012-06-15 ENCOUNTER — Ambulatory Visit (INDEPENDENT_AMBULATORY_CARE_PROVIDER_SITE_OTHER): Payer: Medicare Other | Admitting: Family Medicine

## 2012-06-15 ENCOUNTER — Encounter: Payer: Self-pay | Admitting: Family Medicine

## 2012-06-15 DIAGNOSIS — F509 Eating disorder, unspecified: Secondary | ICD-10-CM

## 2012-06-15 DIAGNOSIS — E119 Type 2 diabetes mellitus without complications: Secondary | ICD-10-CM

## 2012-06-15 NOTE — Progress Notes (Signed)
Medical Nutrition Therapy:  Appt start time: 1030 end time:  1100.  Assessment:  Primary concerns today: Elevated blood glucose (pre-DM) levels and eating disorder. Calina said she has been eating "all carb's" recently, mainly b/c of time constraints, although BG is not likely high b/c of overall low intake.  She has not been checking blood glucose.  She has not been exercising at all; cannot get outside much b/c of severe allergies.  Tahnee has been under a lot of stress trying to deal with her parents' house and the insurance coverage, which is not coming through as anticipated.    24-hr recall suggests intake of 860 kcal:  (UP at 8 AM) B (9 AM)-  1 plain raisin bagel, water     300 Snk ( AM)-  none L (1:30 PM)-  1 [employer] veg burger w/ 1 slice mozzarella (no bun) 200 Snk ( PM)-  none D (5 PM)-  1 pimiento chs sandwich, water    360 Snk ( PM)-  none  Progress:  In Progress.    Nutritional Diagnosis:  No progress noted on NB-1.5 Disordered eating pattern as related to weight anxiety as evidenced by continued inconsistent eating pattern.      Intervention:  Nutrition counseling.     Monitoring/Evaluation:  Dietary intake, body weight, and physical activity in 4 weeks.

## 2012-06-15 NOTE — Patient Instructions (Addendum)
-   Prtein sources:  Cheese, beans, tuna, eggs, soy burgers, (natural) peanut butter, Greek yogurt - Food goals:  1. Veg's 2 X day.   2. At least 3 meals a day.   3. Protein w/ every meal.   - Today's lunch:  Beans and cooked veg's.  - Today's dinner:  Salad, beans, fruit - Exercise goal:  Stationary bike (till conditions get better to get outside) 20 minutes 4 X wk.    - Increase weekly by 5 minutes, working up to 30 minutes at a time.   - Remember also to move as much as you can the rest of the day, i.e.,  walk in place during commercials when you watch TV.   - Shopping today:  Protein foods, frozen veg's, salad veg's, and fruit. - Burrito suggestion:  Mashed beans with grated cheese on top.  Make a lot, and freeze extras.

## 2012-07-13 ENCOUNTER — Ambulatory Visit (INDEPENDENT_AMBULATORY_CARE_PROVIDER_SITE_OTHER): Payer: Medicare Other | Admitting: Family Medicine

## 2012-07-13 ENCOUNTER — Ambulatory Visit: Payer: Medicare Other | Admitting: Family Medicine

## 2012-07-13 ENCOUNTER — Encounter: Payer: Self-pay | Admitting: Family Medicine

## 2012-07-13 DIAGNOSIS — F509 Eating disorder, unspecified: Secondary | ICD-10-CM

## 2012-07-13 DIAGNOSIS — E119 Type 2 diabetes mellitus without complications: Secondary | ICD-10-CM

## 2012-07-13 NOTE — Patient Instructions (Addendum)
-   When at your parents' or sister's house, be sure to get vegetables and some protein for the meal (preferably not always cheese).    - Beans, soy products you bring from home.   - Goals:    1. 3 meals a day, including protein at each meal.     - Plan ahead for meal times, especially if you are out and about (or at someone else's house).  2. Continue exercise at least 4 X wk, plus 15-min walks with Colon daily.  3. Veg's twice a day, and at least 1 serving of fruit.   - Continue food records.    Please schedule appt for July 28 at 10:30 (30 minutes).

## 2012-07-13 NOTE — Progress Notes (Signed)
Medical Nutrition Therapy:  Appt start time: 1030 end time:  1100.  Assessment:  Primary concerns today: Elevated blood glucose (pre-DM) levels and eating disorder. Becky [name] has been eating more consistently, and including more veg's than in the last month.  She has been walking ~40 min 4 X wk with her sister Becky [name].  She was recently diagnosed with BPPV, but is still managing to walk regularly.  Becky [name] was especially pleased to hear that her weight is down since last visit (by 8 lb), but she is still not aware of the exact number, which tends to be triggering for her.  Blood sugar control is good; FBG has been 90's to 105 on days she has checked.    Progress:  In Progress.    Nutritional Diagnosis:  No progress noted on NB-1.5 Disordered eating pattern as related to weight anxiety as evidenced by continued inconsistent eating pattern.      Intervention:  Nutrition counseling.     Monitoring/Evaluation:  Dietary intake, body weight, and physical activity in 4 weeks.

## 2012-07-27 ENCOUNTER — Other Ambulatory Visit: Payer: Self-pay

## 2012-07-27 MED ORDER — BENZTROPINE MESYLATE 0.5 MG PO TABS: 0.5000 mg | ORAL_TABLET | Freq: Every day | ORAL | Status: DC

## 2012-07-27 NOTE — Telephone Encounter (Signed)
Former [facility] patient, has not been assigned new provider.  Auth refill via [facility] 

## 2012-07-30 ENCOUNTER — Other Ambulatory Visit: Payer: Self-pay

## 2012-07-30 MED ORDER — BENZTROPINE MESYLATE 0.5 MG PO TABS: 0.5000 mg | ORAL_TABLET | Freq: Every day | ORAL | Status: DC

## 2012-07-30 NOTE — Telephone Encounter (Signed)
Former [facility] Patient.  Approved refill via [facility]

## 2012-08-10 ENCOUNTER — Encounter: Payer: Self-pay | Admitting: Family Medicine

## 2012-08-10 ENCOUNTER — Ambulatory Visit (INDEPENDENT_AMBULATORY_CARE_PROVIDER_SITE_OTHER): Payer: Medicare Other | Admitting: Family Medicine

## 2012-08-10 VITALS — BP 139/89 | Ht 60.5 in | Wt 150.0 lb

## 2012-08-10 DIAGNOSIS — E119 Type 2 diabetes mellitus without complications: Secondary | ICD-10-CM

## 2012-08-10 DIAGNOSIS — IMO0002 Reserved for concepts with insufficient information to code with codable children: Secondary | ICD-10-CM

## 2012-08-10 DIAGNOSIS — M1710 Unilateral primary osteoarthritis, unspecified knee: Secondary | ICD-10-CM

## 2012-08-10 DIAGNOSIS — F509 Eating disorder, unspecified: Secondary | ICD-10-CM

## 2012-08-10 MED ORDER — METHYLPREDNISOLONE ACETATE 40 MG/ML IJ SUSP
40.0000 mg | Freq: Once | INTRAMUSCULAR | Status: AC
  Administered 2012-08-10: 40 mg via INTRA_ARTICULAR

## 2012-08-10 NOTE — Patient Instructions (Addendum)
-   Snack ideas:  20 or so almonds = 1 oz.  Bag ahead of time for good snack that travels well.   - Food goals:  1. Make sure to eat at least 3 meals per day.   2. Aim for at least 55 grams of protein.    3. Vegetables at least twice a day.   - For best weight management:  - Alvy Beal clear of refined carb's (i.e., biscuits, buns, crackers).   - Continue to walk at least 5 X wk.   - Consistency in exercise, sleep, and in meal times and amount of food (relatively equal calorie distribution).   - Consistent high intake of vegetables (included at both lunch and dinner).   - Berries of any kind are going to be lower-glycemic than bananas, grapes, and pineapple especially.     - Fruits and veg's from which we eat skin and seeds tend to be lower-glycemic b/c we get the fiber along with the fruit.   - Breakfast ideas:  Steel-cut oats Karin Golden) with berries (& nuts/seeds).  To increase the protein content, cook with extra water, then add 1/3 cup milk powder per serving = 8 additional grams of protein). - NOTE: FOOD RECORDS SHOW YOUR VEG INTAKE TO BE INCONSISTENT (i.e., several days w/ no veg's, for an average of ~1 X day over 3 wks).

## 2012-08-10 NOTE — Progress Notes (Signed)
Medical Nutrition Therapy:  Appt start time: 1030 end time:  1100.  Assessment:  Primary concerns today: Elevated blood glucose (pre-DM) levels and eating disorder. Becky [name] has been walking ~40 min 5 X wk with her sister Becky [name].  Food-wise, she said she has done "fair."  She has not obtained more than 48 g protein on any day, but has been eating 3 meals a day on most days.  Of significance, Becky [name] has averaged getting vegetables about once a day, but there are several days in which she ate no veg's, and during these few days, she skipped some meals altogether.   Becky [name] says she is sleeping well, and not excessively now, and she feels more energetic with her regular walking.   Becky [name] is not checking FBG recently, after deciding they were mostly the same, 92-98.   Again asked about her weight, if it is going down of staying the same.    Progress:  In Progress.    Nutritional Diagnosis:  No progress noted on NB-1.5 Disordered eating pattern as related to weight anxiety as evidenced by continued inconsistent eating pattern.      Intervention:  Nutrition counseling.     Monitoring/Evaluation:  Dietary intake, body weight, and physical activity in 4 weeks.

## 2012-08-11 NOTE — Assessment & Plan Note (Signed)
Bilateral corticosteroid knee injections. Followup when necessary

## 2012-08-11 NOTE — Progress Notes (Signed)
  Subjective:    Patient ID: Becky [name], female    DOB: [birth year], 57 y.o.   MRN: [id number]  HPI Bilateral knee pain. Significant improvement after the last injections until about 2 months ago she started having some pain in the last 2 weeks has had significant pain. Keeping her from doing the walking she likes to do for her exercise. She's walking 3 or more miles most days of the week.   Review of Systems Denies fever, sweats, chills.    Objective:   Physical Exam  Vital signs are reviewed GENERAL: Well-developed female no acute distress KNEES: Full extension and flexion. Tender to palpation along the medial joint line bilaterally. No effusion. Positive crepitus on the right knee none on the left. Popliteal space is benign. Calf is soft bilaterally. Distally she is neurovascularly intact. INJECTION: Patient was given informed consent, signed copy in the chart. Appropriate time out was taken. Area prepped and draped in usual sterile fashion. One cc of methylprednisolone 40 mg/ml plus  4 cc of 1% lidocaine without epinephrine was injected into the bilateral knees using a(n) anterior medial approach. The patient tolerated the procedure well. There were no complications. Post procedure instructions were given.       Assessment & Plan:

## 2012-09-07 ENCOUNTER — Ambulatory Visit (INDEPENDENT_AMBULATORY_CARE_PROVIDER_SITE_OTHER): Payer: Medicare Other | Admitting: Family Medicine

## 2012-09-07 ENCOUNTER — Telehealth: Payer: Self-pay | Admitting: Critical Care Medicine

## 2012-09-07 ENCOUNTER — Other Ambulatory Visit: Payer: Self-pay | Admitting: Critical Care Medicine

## 2012-09-07 ENCOUNTER — Encounter: Payer: Self-pay | Admitting: Family Medicine

## 2012-09-07 DIAGNOSIS — E119 Type 2 diabetes mellitus without complications: Secondary | ICD-10-CM

## 2012-09-07 DIAGNOSIS — F509 Eating disorder, unspecified: Secondary | ICD-10-CM

## 2012-09-07 MED ORDER — MOMETASONE FURO-FORMOTEROL FUM 200-5 MCG/ACT IN AERO: 2.0000 | INHALATION_SPRAY | Freq: Two times a day (BID) | RESPIRATORY_TRACT | Status: DC

## 2012-09-07 NOTE — Patient Instructions (Addendum)
-   GOALS remain as before:    1. 55 g protein per day; add eggs to diet to help meet this goal.    2. Three meals and 1-2 snacks.    3. Vegetables at least 2 X day.   4. Continue walking at least 40 minutes 5 X wk. Experiment with:   - using your stationary bike in stead of walking on some days.     - Albuterol 15 min before you walk outside.    - Next appts:   8/25 at 4:30 9/22 at 11:30 10/20 at 11:30

## 2012-09-07 NOTE — Telephone Encounter (Signed)
Pt requests refill of Dulera 200 Inhale 2 puffs into the lungs 2 (two) times daily #1 inhaler  X zero refills Sent to [facility], [place]  Pt last seen 02/19/12 by TP and was told to f/u in 3 months--never scheduled. Pt scheduled to see Dr Delford Field 09/09/12 at 2pm Pt aware to keep follow up appt for further refills

## 2012-09-07 NOTE — Progress Notes (Signed)
Medical Nutrition Therapy:  Appt start time: 1030 end time:  1100.  Assessment:  Primary concerns today: Elevated blood glucose (pre-DM) levels and eating disorder. Becky [name] has a good friend who was just diagnosed with stomach cancer.  She anticipates helping her friend out over the upcoming weeks/months of treatment.    Becky [name] has been walking 40 min 5 X wk despite still feeling fatigued every day.  Her asthma has been bad, which she suspects is causing the fatigue.  She has been consistent with getting 3 meals a day; hit protein goal of 55 g on 19 of the past 29 days.    Progress:  In Progress.    Nutritional Diagnosis:  Some progress noted on NB-1.5 Disordered eating pattern as related to weight anxiety as evidenced by improved consistency in eating pattern.      Intervention:  Nutrition counseling.     Monitoring/Evaluation:  Dietary intake, body weight, and physical activity in 4 weeks.

## 2012-09-09 ENCOUNTER — Encounter: Payer: Self-pay | Admitting: Critical Care Medicine

## 2012-09-09 ENCOUNTER — Ambulatory Visit (INDEPENDENT_AMBULATORY_CARE_PROVIDER_SITE_OTHER): Payer: Medicare Other | Admitting: Critical Care Medicine

## 2012-09-09 VITALS — BP 122/88 | HR 71 | Temp 98.8°F | Ht 60.5 in | Wt 164.2 lb

## 2012-09-09 DIAGNOSIS — J45909 Unspecified asthma, uncomplicated: Secondary | ICD-10-CM

## 2012-09-09 MED ORDER — MOMETASONE FUROATE 220 MCG/INH IN AEPB: 2.0000 | INHALATION_SPRAY | Freq: Every day | RESPIRATORY_TRACT | Status: DC

## 2012-09-09 NOTE — Patient Instructions (Addendum)
Stop [employer] Start Asmanex two puff daily (use samples before filling Rx to make sure you are doing well on asmanex)  Return 4  months

## 2012-09-09 NOTE — Assessment & Plan Note (Signed)
Severe persistent asthma with atopic and reflux as a precipitating factors improved at this time Note normal lung function previously documented Plan Discontinue Dulera Begin Asmanex 2 puff daily Maintain albuterol as needed

## 2012-09-09 NOTE — Progress Notes (Signed)
  Subjective:    Patient ID: Becky [name], female    DOB: 03/16/1955, 57 y.o.   MRN: [id number]  HPI  Becky [name] is a 57 yo pleasant female with severe persistent asthma with atopic and reflux precipitating factors. She is a former smoker.  ROV >>> 09/09/12  Patient returns for 6 month follow up.  She has only mild symptoms of sob when walking and throughout the week.  She has not had any flares since her last visit and uses her albuterol only a couple times a week. She denies nighttime awakenings or her asthma interfering with normal activity. The heat aggravates her breathing when she is outdoors walking. She walks about 40 minutes a day and has yet to try using ProAir 15 min prior to exercising to help with the DOE.  She has intermittent nasal congestion and drainage and is easily managed on OTC medications. She denies fever, chills, chest tightness, and wheezing.   PUL ASTHMA HISTORY 09/09/2012 01/19/2012 08/14/2011 04/10/2011  Symptoms >2 days/week 0-2 days/week >2 days/week Daily  Nighttime awakenings 0-2/month 0-2/month 0-2/month Often--7/wk  Interference with activity No limitations No limitations Minor limitations Some limitations  SABA use 0-2 days/wk 0-2 days/wk 0-2 days/wk Daily  Exacerbations requiring oral steroids 0-1 / year 0-1 / year 0-1 / year 2 or more / year    Review of Systems  Constitutional: Positive for fatigue. Negative for fever, chills and diaphoresis.  HENT: Positive for congestion, rhinorrhea and postnasal drip. Negative for sneezing.   Respiratory: Positive for cough, chest tightness and shortness of breath. Negative for wheezing.   Cardiovascular: Negative for chest pain.       Objective:   Physical Exam  VITAL SIGNS:   BP 122/88  Pulse 71  Temp(Src) 98.8 F (37.1 C) (Oral)  Ht 5' 0.5" (1.537 m)  Wt 164 lb 3.2 oz (74.481 kg)  BMI 31.53 kg/m2  SpO2 93%  HEENT: Head is normocephalic and atraumatic. Extraocular muscles are intact.  Pupils are equal, round, and reactive to light. Nares appeared normal. Mouth is well hydrated and without lesions. Enlarged turbinates. Mucous membranes are moist. Posterior pharynx clear of any exudate or lesions. Tonsils are removed. NECK: Supple. No carotid bruits. No lymphadenopathy or thyromegaly. LUNGS: Clear to auscultation. HEART: Regular rate and rhythm without murmur.  ABDOMEN: Soft, nontender, and nondistended. Positive bowel sounds. No hepatosplenomegaly was noted.  EXTREMITIES: Without any cyanosis, clubbing, rash, lesions or edema.  NEUROLOGIC: Cranial nerves II through XII are grossly intact.  SKIN: No ulceration or induration present.        Assessment & Plan:  Severe Persistent Asthma -  Condition stable. Will try a less potent inhaler to manage symptoms since she only has intermittent use of albuterol and is breathing well and has clear lungs.  - Stop Dulera  - Start Asmanex two puff daily (use samples before filling Rx to make sure you are doing well on asmanex)   - Return 4  months

## 2012-09-11 ENCOUNTER — Telehealth: Payer: Self-pay | Admitting: Critical Care Medicine

## 2012-09-11 NOTE — Telephone Encounter (Signed)
I spoke with pt and she stated she is not able to come in until to be shown. She will call back then to let us know exactly when she can. Will sign off message

## 2012-09-11 NOTE — Telephone Encounter (Signed)
LMOMTCB x1 for pt 

## 2012-09-11 NOTE — Telephone Encounter (Signed)
I spoke with pt. She stated Becky [name] and the student showed her how to use the asamanex. She stated now it feels like nothing is coming through the inhaler. She can't even taste anything in her mouth after using this medication. The counter on the inhaler does change when she twists the inhaler to inhale but she still can't feel anything coming out of the inhaler. Please advise Dr. Delford Field thanks  Allergies  Allergen Reactions  . Aspirin     REACTION: Gi upset  . Clarithromycin     REACTION: rash,fever  . Codeine Nausea And Vomiting  . Methadone     REACTION: respiratory problems, chest pain  . Ms Contin (Morphine Sulfate)     CP and SOB  . Nitrofurantoin     REACTION: respiratory problems, chest pains  . Penicillins     REACTION: rash  . Percocet (Oxycodone-Acetaminophen)     CP and SOB  . Amoxicillin Rash

## 2012-09-11 NOTE — Telephone Encounter (Signed)
Pt returned triage's call & can be reached at [phone].  Antionette Fairy

## 2012-09-11 NOTE — Telephone Encounter (Signed)
She will need to stop in and be redemonstrated how to use the device

## 2012-10-05 ENCOUNTER — Encounter: Payer: Self-pay | Admitting: *Deleted

## 2012-10-05 ENCOUNTER — Ambulatory Visit (INDEPENDENT_AMBULATORY_CARE_PROVIDER_SITE_OTHER): Payer: Medicare Other | Admitting: Family Medicine

## 2012-10-05 ENCOUNTER — Encounter: Payer: Self-pay | Admitting: Family Medicine

## 2012-10-05 VITALS — Ht 60.25 in | Wt 162.6 lb

## 2012-10-05 DIAGNOSIS — E119 Type 2 diabetes mellitus without complications: Secondary | ICD-10-CM

## 2012-10-05 DIAGNOSIS — F509 Eating disorder, unspecified: Secondary | ICD-10-CM

## 2012-10-05 NOTE — Progress Notes (Signed)
Medical Nutrition Therapy:  Appt start time: 1645 end time:  1700.  Assessment:  Primary concerns today: Elevated blood glucose (pre-DM) levels and eating disorder. Becky [name] was suicidal a couple weeks ago, for which her father got mad at her instead of being supportive.  This was hurtful to Becky [name], and only made her more despondent.  She cannot pinpoint just what has increased her depression, other than sadness that her friend Becky [name] has advanced abdominal cancer.  Becky [name] said she is better now - not suicidal - but still feeling very sad.  Her depression was reflected in food records, which showed skipped meals and poor meal composition, i.e., only carrots for one meal.  In the last 10 days protein intake has been well below the goal of 55 g/day.  Becky [name] has not exercised in the past week, feeling no motivation to do so.   Progress:  In Progress.    Nutritional Diagnosis:  Regression on NB-1.5 Disordered eating pattern as related to weight anxiety as evidenced by deteriorated consistency in eating pattern.      Intervention:  Nutrition counseling.     Monitoring/Evaluation:  Dietary intake, body weight, and physical activity in 4 weeks.

## 2012-10-05 NOTE — Patient Instructions (Addendum)
-   GOALS remain as before:  1. 55 g protein per day; add eggs to diet to help meet this goal.  2. Three meals and 1-2 snacks.  3. Vegetables at least 2 X day.  4. Continue walking at least 40 minutes 5 X wk.  Experiment with:  - Using your stationary bike instead of walking on some days.  - Albuterol 15 min before you walk outside.

## 2012-10-06 NOTE — Telephone Encounter (Signed)
Error   This encounter was created in error - please disregard. 

## 2012-10-31 ENCOUNTER — Other Ambulatory Visit: Payer: Self-pay | Admitting: Neurology

## 2012-11-02 ENCOUNTER — Ambulatory Visit (INDEPENDENT_AMBULATORY_CARE_PROVIDER_SITE_OTHER): Payer: Medicare Other | Admitting: Family Medicine

## 2012-11-02 ENCOUNTER — Encounter: Payer: Self-pay | Admitting: Family Medicine

## 2012-11-02 VITALS — Ht 60.5 in | Wt 164.5 lb

## 2012-11-02 DIAGNOSIS — E119 Type 2 diabetes mellitus without complications: Secondary | ICD-10-CM

## 2012-11-02 DIAGNOSIS — R7309 Other abnormal glucose: Secondary | ICD-10-CM

## 2012-11-02 DIAGNOSIS — F509 Eating disorder, unspecified: Secondary | ICD-10-CM

## 2012-11-02 NOTE — Patient Instructions (Addendum)
-   Remember to use beans.  If you use beans as a sole source of protein at a meal, have at least 1 full cup (1/2 can).   - Try to get variety in your protein sources, and aim for more of these proteins to come from REAL FOOD vs. The bars.   - Incorporate more veg's, aiming for 2 X day (both lunch and dinner).  - If you continue to be too busy for food records, then at least check off the times you have veg's.   Gilman Buttner your weekly totals, and get the average # for the week.  Bring these averages to next appt. - Other goals remain the same:  55 g protein per day, minimum of 3 meals a day, and walk 40:00 5 X wk.   Please schedule your Nov appt:  Nov 17 at 10:30.

## 2012-11-02 NOTE — Progress Notes (Signed)
Medical Nutrition Therapy:  Appt start time: 1130 end time:  1200.  Assessment:  Primary concerns today: Elevated blood glucose (pre-DM) levels and eating disorder. Both Becky [name]'s [name] and [name] fell within the last couple weeks ago, so Becky [name] has been busy with dr's appts, etc. since then.  Consequently, she has not kept food records.  In addn, Becky [name]'s drinking has continued to be out of hand, which manifests as inappropriate behavior toward women.  This has been a source of stress.  She said that Becky eating has been pretty good except the week before last when she was very busy getting Becky parents' house ready for out-of-town company.  She has walked 3-4 X wk, including some speed walking ~40 min.  She can definitely do better on days she uses Becky inhaler.   Becky [name]'s weight is up 5 lb since July.  The 2-lb gain in the last month probably reflects Becky busier schedule with fewer veg's.    24-hr recall suggests intake of ~810 kcal:  (UP at 8 AM) B (8 AM)-  1/2 c Special K Protein cereal, 1/2 c soymilk  120 Snk (10 AM)-  1 protein bar, water     170 L (1 PM)-  Salad, 1/2 c cheese, apple, water   210 Snk (4 PM)-  1 protein bar, water     170 D (8 PM)-  the rest of salad, water    140 Snk ( PM)-  none Sources of protein have included cheese, [place] yogurt, protein bars, Smart One dinners, soy burger, tuna fish.   Progress:  In Progress.    Nutritional Diagnosis:  Regression on NB-1.5 Disordered eating pattern as related to weight anxiety as evidenced by deteriorated consistency in eating pattern.      Intervention:  Nutrition counseling.     Monitoring/Evaluation:  Dietary intake, body weight, and physical activity in 4 weeks.

## 2012-11-13 ENCOUNTER — Other Ambulatory Visit: Payer: Self-pay | Admitting: *Deleted

## 2012-11-13 MED ORDER — MOMETASONE FUROATE 220 MCG/INH IN AEPB: 2.0000 | INHALATION_SPRAY | Freq: Every day | RESPIRATORY_TRACT | Status: DC

## 2012-11-13 MED ORDER — OMEPRAZOLE 20 MG PO CPDR: 20.0000 mg | DELAYED_RELEASE_CAPSULE | Freq: Two times a day (BID) | ORAL | Status: DC

## 2012-11-16 ENCOUNTER — Other Ambulatory Visit: Payer: Self-pay | Admitting: Gastroenterology

## 2012-11-16 NOTE — Addendum Note (Signed)
Addended by: Dorena Cookey on: 11/16/2012 01:44 PM   Modules accepted: Orders

## 2012-11-18 ENCOUNTER — Ambulatory Visit (HOSPITAL_COMMUNITY)
Admission: RE | Admit: 2012-11-18 | Discharge: 2012-11-18 | Disposition: A | Discharge status: Home / Self Care | Payer: Medicare Other | Source: Ambulatory Visit | Attending: Gastroenterology | Admitting: Gastroenterology

## 2012-11-18 ENCOUNTER — Encounter (HOSPITAL_COMMUNITY): Payer: Self-pay

## 2012-11-18 ENCOUNTER — Encounter (HOSPITAL_COMMUNITY)
Admission: RE | Disposition: A | Discharge status: Home / Self Care | Payer: Self-pay | Source: Ambulatory Visit | Attending: Gastroenterology

## 2012-11-18 DIAGNOSIS — J45909 Unspecified asthma, uncomplicated: Secondary | ICD-10-CM | POA: Insufficient documentation

## 2012-11-18 DIAGNOSIS — I7300 Raynaud's syndrome without gangrene: Secondary | ICD-10-CM | POA: Diagnosis not present

## 2012-11-18 DIAGNOSIS — E119 Type 2 diabetes mellitus without complications: Secondary | ICD-10-CM | POA: Insufficient documentation

## 2012-11-18 DIAGNOSIS — K219 Gastro-esophageal reflux disease without esophagitis: Secondary | ICD-10-CM | POA: Insufficient documentation

## 2012-11-18 DIAGNOSIS — Z79899 Other long term (current) drug therapy: Secondary | ICD-10-CM | POA: Diagnosis not present

## 2012-11-18 DIAGNOSIS — K3184 Gastroparesis: Secondary | ICD-10-CM | POA: Insufficient documentation

## 2012-11-18 HISTORY — PX: ESOPHAGOGASTRODUODENOSCOPY: SHX5428

## 2012-11-18 HISTORY — PX: BOTOX INJECTION: SHX5754

## 2012-11-18 SURGERY — EGD (ESOPHAGOGASTRODUODENOSCOPY)
Anesthesia: Moderate Sedation

## 2012-11-18 MED ORDER — FENTANYL CITRATE 0.05 MG/ML IJ SOLN
INTRAMUSCULAR | Status: AC
  Filled 2012-11-18: qty 2

## 2012-11-18 MED ORDER — BUTAMBEN-TETRACAINE-BENZOCAINE 2-2-14 % EX AERO
INHALATION_SPRAY | CUTANEOUS | Status: DC | PRN
  Administered 2012-11-18: 2 via TOPICAL

## 2012-11-18 MED ORDER — MIDAZOLAM HCL 10 MG/2ML IJ SOLN
INTRAMUSCULAR | Status: AC
  Filled 2012-11-18: qty 2

## 2012-11-18 MED ORDER — FENTANYL CITRATE 0.05 MG/ML IJ SOLN
INTRAMUSCULAR | Status: DC | PRN
  Administered 2012-11-18 (×3): 25 ug via INTRAVENOUS

## 2012-11-18 MED ORDER — SODIUM CHLORIDE 0.9 % IV SOLN: INTRAVENOUS | Status: DC

## 2012-11-18 MED ORDER — MIDAZOLAM HCL 10 MG/2ML IJ SOLN
INTRAMUSCULAR | Status: DC | PRN
  Administered 2012-11-18 (×3): 2 mg via INTRAVENOUS

## 2012-11-18 MED ORDER — ONABOTULINUMTOXINA 100 UNITS IJ SOLR
100.0000 [IU] | Freq: Once | INTRAMUSCULAR | Status: AC
  Administered 2012-11-18: 100 [IU] via INTRAMUSCULAR
  Filled 2012-11-18: qty 100

## 2012-11-18 NOTE — H&P (Signed)
[facility] Admission History & Physical  Chief Complaint: gastroparesis HPI: Becky [name] is an 57 y.o. white   female.  With idiopathic gastroparesis who presents for pyloric botox injection which has successfully relieved symptoms several times in the past  Past Medical History  Diagnosis Date  . Asthma   . GERD (gastroesophageal reflux disease)   . Chronic pain syndrome   . Migraine   . Gastroparesis     botox injections  . Anorexia nervosa   . Diabetes mellitus   . Multiple allergies   . Migraine   . Personality change due to conditions classified elsewhere   . Hyperglycemia   . Painful respiration   . Raynaud's syndrome   . Encounter for long-term (current) use of other medications   . Ventral hernia   . Vitamin D deficiency   . Tremor   . Arthritis     bilateral knees  . Murmur, cardiac     Past Surgical History  Procedure Laterality Date  . Nissen fundoplication  2005    [name]  . Cholecystectomy    . Peg tube placement      removed 1996  . Tonsillectomy and adenoidectomy    . Hernia repair    . Esophagogastroduodenoscopy  08/20/2011    Procedure: ESOPHAGOGASTRODUODENOSCOPY (EGD);  Surgeon: Barrie Folk, MD;  Location: [facility] ENDOSCOPY;  Service: Endoscopy;  Laterality: N/A;    Medications Prior to Admission  Medication Sig Dispense Refill  . albuterol (PROAIR HFA) 108 (90 BASE) MCG/ACT inhaler Inhale 2 puffs into the lungs every 6 (six) hours as needed.  1 Inhaler  6  . albuterol (PROVENTIL) (2.5 MG/3ML) 0.083% nebulizer solution Take 3 mLs (2.5 mg total) by nebulization every 6 (six) hours as needed. DX:  493.00 FILE WITH MCR PART B  300 mL  5  . benztropine (COGENTIN) 0.5 MG tablet Take 1 tablet (0.5 mg total) by mouth at bedtime.  90 tablet  0  . benztropine (COGENTIN) 0.5 MG tablet TAKE 1 TABLET BY MOUTH AT BEDTIME  30 tablet  0  . buPROPion (WELLBUTRIN XL) 150 MG 24 hr tablet Take 3 tablets by mouth daily to = 450mg       .  cetirizine (ZYRTEC) 10 MG tablet Take 10 mg by mouth every morning.        . clonazePAM (KLONOPIN) 0.5 MG tablet Take 0.5 mg by mouth 4 (four) times daily.       . cycloSPORINE (RESTASIS) 0.05 % ophthalmic emulsion Place 1 drop into both eyes 2 (two) times daily.      . DULoxetine (CYMBALTA) 30 MG capsule Take 30 mg by mouth daily.       . DULoxetine (CYMBALTA) 60 MG capsule Take 60 mg by mouth 2 (two) times daily. Taken at lunch time.      . eszopiclone (LUNESTA) 2 MG TABS Take 2 mg by mouth at bedtime. Take immediately before bedtime       . gabapentin (NEURONTIN) 100 MG capsule Take 100 mg by mouth 3 (three) times daily.      Marland Kitchen losartan (COZAAR) 50 MG tablet Take 50 mg by mouth daily.      . meclizine (ANTIVERT) 25 MG tablet Take 1 tablet by mouth as needed.      . metFORMIN (GLUCOPHAGE) 500 MG tablet Take 500 mg by mouth daily after breakfast.      . montelukast (SINGULAIR) 10 MG tablet Take 1 tablet by mouth Daily.      Marland Kitchen  Multiple Vitamins-Minerals ([facility] DAILY MULTIPLE WOMEN 50+ PO) Take by mouth daily.        Marland Kitchen omeprazole (PRILOSEC) 20 MG capsule Take 1 capsule (20 mg total) by mouth 2 (two) times daily.  180 capsule  2  . PROAIR HFA 108 (90 BASE) MCG/ACT inhaler INHALE 2 PUFFS INTO THE LUNGS EVERY 6 HOURS AS NEEDED  1 Inhaler  6  . promethazine (PHENERGAN) 25 MG tablet Take 25 mg by mouth every 6 (six) hours as needed.      Marland Kitchen Propylene Glycol (SYSTANE BALANCE OP) Apply to eye.      Marland Kitchen QUEtiapine Fumarate (SEROQUEL XR) 150 MG 24 hr tablet Take 150 mg by mouth at bedtime.      . traZODone (DESYREL) 50 MG tablet Take 25 mg by mouth at bedtime.       . ziprasidone (GEODON) 20 MG capsule Take 20 mg by mouth every morning.      . ziprasidone (GEODON) 40 MG capsule Take by mouth. Taking 40 at dinner      . ziprasidone (GEODON) 80 MG capsule Take 80 mg by mouth at bedtime. Taking 80 mg at bedtime      . Cholecalciferol 50000 UNIT capsule Take 50,000 Units by mouth as directed. Take 1 every week       . fluticasone (FLONASE) 50 MCG/ACT nasal spray 2 sprays daily.        Marland Kitchen ibuprofen (ADVIL,MOTRIN) 600 MG tablet Take 600 mg by mouth 2 (two) times daily as needed. 08/20/11 per pt, taking in morning and at night per MD instructions      . lidocaine (LIDODERM) 5 % Place 1 patch onto the skin daily as needed. Remove & Discard patch within 12 hours or as directed by MD      . mometasone (ASMANEX 120 METERED DOSES) 220 MCG/INH inhaler Inhale 2 puffs into the lungs daily.  3 Inhaler  4  . traMADol (ULTRAM) 50 MG tablet Take 1-2 two times a day as needed for knee pain  60 tablet  1    Allergies:  Allergies  Allergen Reactions  . Aspirin     REACTION: Gi upset  . Clarithromycin     REACTION: rash,fever  . Codeine Nausea And Vomiting  . Methadone     REACTION: respiratory problems, chest pain  . Ms Contin [Morphine Sulfate]     CP and SOB  . Nitrofurantoin     REACTION: respiratory problems, chest pains  . Penicillins     REACTION: rash  . Percocet [Oxycodone-Acetaminophen]     CP and SOB  . Amoxicillin Rash    Family History  Problem Relation Age of Onset  . Emphysema Paternal Grandfather     was a smoker    Social History:  reports that she quit smoking about 19 years ago. Her smoking use included Cigarettes. She has a 1 pack-year smoking history. She has never used smokeless tobacco. She reports that she does not drink alcohol or use illicit drugs.  Review of Systems: negative except as above   Blood pressure 119/72, pulse 74, temperature 98.3 F (36.8 C), temperature source Oral, resp. rate 14, SpO2 96.00%. Head: Normocephalic, without obvious abnormality, atraumatic Neck: no adenopathy, no carotid bruit, no JVD, supple, symmetrical, trachea midline and thyroid not enlarged, symmetric, no tenderness/mass/nodules Resp: clear to auscultation bilaterally Cardio: regular rate and rhythm, S1, S2 normal, no murmur, click, rub or gallop GI: abd soft  Extremities: extremities normal,  atraumatic, no cyanosis or edema  No results found for this or any previous visit (from the past 48 hour(s)). No results found.  Assessment:  Gastroparesis Plan: Proceed with EGD and botox  Jerrell Hart C 11/18/2012, 9:45 AM

## 2012-11-18 NOTE — Op Note (Signed)
[facility] [address] [place], [address]   ENDOSCOPY PROCEDURE REPORT  PATIENT: Becky, [name]  MR#: [id number] BIRTHDATE: 1955/08/06 , 57  yrs. old GENDER: Female ENDOSCOPIST:Derrel Moore Madilyn Fireman, MD REFERRED BY: PROCEDURE DATE:  11/18/2012 PROCEDURE: ASA CLASS: INDICATIONS: MEDICATION:   75 fentanyl Versed 6 TOPICAL ANESTHETIC:  DESCRIPTION OF PROCEDURE:   esophagus: nml Stomach : nml 25 cc botox injected into 4 quadrants of the pylorus Duodenum nml     COMPLICATIONS: None  ENDOSCOPIC IMPRESSION:nml EGD, sp pyloric botox injection  RECOMMENDATIONS:dc to home    _______________________________ eSignedDorena Cookey, MD 11/18/2012 10:24 AM

## 2012-11-19 ENCOUNTER — Encounter (HOSPITAL_COMMUNITY): Payer: Self-pay | Admitting: Gastroenterology

## 2012-11-30 ENCOUNTER — Ambulatory Visit: Payer: Medicare Other | Admitting: Family Medicine

## 2012-12-14 ENCOUNTER — Other Ambulatory Visit: Payer: Self-pay | Admitting: Neurology

## 2012-12-17 ENCOUNTER — Encounter: Payer: Self-pay | Admitting: Pharmacist

## 2012-12-17 ENCOUNTER — Other Ambulatory Visit: Payer: Self-pay

## 2012-12-17 ENCOUNTER — Ambulatory Visit (INDEPENDENT_AMBULATORY_CARE_PROVIDER_SITE_OTHER): Payer: Medicare Other | Admitting: Pharmacist

## 2012-12-17 VITALS — BP 136/94 | HR 84

## 2012-12-17 DIAGNOSIS — F4481 Dissociative identity disorder: Secondary | ICD-10-CM

## 2012-12-17 NOTE — Assessment & Plan Note (Signed)
Patient reports complete compliance with all scheduled medications as prescribed with the exception of eye drops. Patient has been out of lunesta for several days but has had no changes in sleep. Patient may consider refraining from restarting. Reviewed potential for Geodon and Seroquel to increase weight. Emphasized need for continued exercise and healthy diet.   Clonazepam is currently 0.5 mg 4 times daily. Discussed decreasing frequency to twice daily either with 1 mg BID or start to ween off to 0.5 mg BID.   Tremor could be caused by dopamine blockade (quetiapine/ziprazodone). It is being treated with Cogentin.

## 2012-12-17 NOTE — Progress Notes (Signed)
Patient ID: Becky [name], female   DOB: 1955-10-22, 58 y.o.   MRN: [id number] Reviewed: Agree with Dr. Macky Lower management and documentation.

## 2012-12-17 NOTE — Patient Instructions (Addendum)
Thanks for coming in today.  Great to talk to you about your medicines.   No significant changes to medications today.    Please bring Dulera dose to next visit.   Please work on walking more consistently.   Enjoy your new puppy.

## 2012-12-17 NOTE — Progress Notes (Signed)
S:  Patient arrives independently in good spirits requesting not to be weighed. She presents for medication review and reconciliation. She reports no complaints. She is concerned about which medications may be causing weight gain.   Allergy list and medications were reviewed and she reports good compliance. She was not completely sure about Dulera dose.   A/P:  Patient reports complete compliance with all scheduled medications as prescribed with the exception of eye drops. Patient has been out of lunesta for several days but has had no changes in sleep. Patient may consider refraining from restarting. Reviewed potential for Geodon and Seroquel to increase weight. Emphasized need for continued exercise and healthy diet.   Patient asked to bring [facility] dose to next visit.   Clonazepam is currently 0.5 mg 4 times daily. Discussed decreasing frequency to twice daily either with 1 mg BID or start to ween off to 0.5 mg BID.   Tremor could be caused by dopamine blockade (quetiapine/ziprazodone). It is being treated with Cogentin.

## 2012-12-22 ENCOUNTER — Ambulatory Visit: Payer: Medicare Other | Admitting: Family Medicine

## 2012-12-28 ENCOUNTER — Ambulatory Visit: Payer: Medicare Other | Admitting: Family Medicine

## 2012-12-28 ENCOUNTER — Encounter: Payer: Self-pay | Admitting: Family Medicine

## 2012-12-28 ENCOUNTER — Ambulatory Visit (INDEPENDENT_AMBULATORY_CARE_PROVIDER_SITE_OTHER): Payer: Medicare Other | Admitting: Family Medicine

## 2012-12-28 VITALS — Ht 60.5 in | Wt 165.1 lb

## 2012-12-28 DIAGNOSIS — R7309 Other abnormal glucose: Secondary | ICD-10-CM

## 2012-12-28 DIAGNOSIS — F509 Eating disorder, unspecified: Secondary | ICD-10-CM

## 2012-12-28 DIAGNOSIS — E119 Type 2 diabetes mellitus without complications: Secondary | ICD-10-CM

## 2012-12-28 NOTE — Patient Instructions (Signed)
-   Get your bike trainer set up at home.    GOAL:  30 min stationary bike 2-3 days a week in addn to your walking 3 X wk.  - Use your protein bars for snacks, not as a meal replacement.  Your per-meal protein goal is at least 15 grams.    Remember to use your other protein foods like eggs, tuna, soy burgers, and beans.  (Keep these foods well stocked.) - Monitoring your goals:    - Write g of protein on calendar.  - Check mark at end of day of you have eaten at least 4 times that day.   - "V" on the calendar for each time you eat veg's in the day.  (Goal is at least 2 X.)

## 2012-12-28 NOTE — Progress Notes (Signed)
Medical Nutrition Therapy:  Appt start time: 1200 end time:  1230.  Assessment:  Primary concerns today: Elevated blood glucose (pre-DM) levels and eating disorder. Becky [name] has an appt Thursday with psychiatrist Phillip Heal, whom she will ask about the possibility of weaning down/off Geodon and if she can change her clonazepam to 1 mg 2 X day instead of 0.5 mg 4 X day.   Becky [name] has not been keeping food records, experimenting with not keeping them daily.  She has been walking with her sister ~40 min 3 X wk.  She has a bike trainer at home, which is not currently set up, but she agreed this would be a good addition to her weekly routine.  Becky [name] has been getting a protein bar 1-2 X day, and has not been getting vegetables regularly.  She said she feels it is related to feeling depressed.  She has also started having what sound like anxiety attacks, possibly triggered in part by worries about her friend who has cancer as well as financial constraints.    24-hr recall suggests intake of <900 kcal:  (UP at 6:30 AM) B (9:30 AM)-  1 protein bar (21 g; 170 kcal), water Snk ( AM)-   L (1 PM)-  1/2 c nuts, seeds & raisins, water Snk ( PM)-   D (6:30 PM)-  1 Boca burger, 2 c broccoli, water Snk ( PM)-  1 protein bar (21 g; 170 kcal), water  Progress:  In Progress.    Nutritional Diagnosis:  Regression on NB-1.5 Disordered eating pattern as related to weight anxiety as evidenced by continued inconsistency in eating pattern.      Intervention:  Nutrition counseling.     Monitoring/Evaluation:  Dietary intake, body weight, and physical activity in 4 weeks.

## 2013-01-12 ENCOUNTER — Ambulatory Visit (INDEPENDENT_AMBULATORY_CARE_PROVIDER_SITE_OTHER): Payer: Medicare Other | Admitting: Neurology

## 2013-01-12 ENCOUNTER — Encounter: Payer: Self-pay | Admitting: Neurology

## 2013-01-12 VITALS — BP 121/84 | HR 70

## 2013-01-12 DIAGNOSIS — M1710 Unilateral primary osteoarthritis, unspecified knee: Secondary | ICD-10-CM

## 2013-01-12 DIAGNOSIS — IMO0002 Reserved for concepts with insufficient information to code with codable children: Secondary | ICD-10-CM

## 2013-01-12 DIAGNOSIS — M549 Dorsalgia, unspecified: Secondary | ICD-10-CM

## 2013-01-12 MED ORDER — BENZTROPINE MESYLATE 0.5 MG PO TABS: 0.5000 mg | ORAL_TABLET | Freq: Every day | ORAL | Status: DC

## 2013-01-12 NOTE — Progress Notes (Signed)
[facility]  PATIENT: Becky [name] DOB: 1955/05/10  HISTORICAL Becky [name] is a 57 years old right-handed Caucasian female, previous patient of Dr. love, came in to followup for mild Parkinsonian features,  She had long-standing history of psychiatric disease, with diagnosis of multiple personality disorder, since 1986, she has received different psychiatric hospitals admission, psychotropic medications, she is currently taking Wellbutrin 150 mg 3 tablets every day, clonazepam 0.5 mg 3 times a day, Cymbalta 150 mg in the morning, Lunesta 2 mg at bedtime, gabapentin 100 mg twice a day, tramadol 50 mg one to 2 tablets as needed, trazodone 50 mg at bedtime, Geodon 140 mg a day  She was initially evaluated by Becky [name] since 2011, because all right hand tremor, also involving both feet, gradually getting worse  She was noted to have mild Parkinsonian features, was started on Cogentin 0. 5 mg every night, her symptoms has been fairly stable over the past few years,  She is also under close supervision of her psychologist,  MRI of lumbar spine in 2008, showed degenerative disc disease at L5-S1, was grade 1 retrolisthesis, right L2-3 extraforaminal disc bulging,  She lives independently, still driving, her psychiatric symptoms are under good control.  REVIEW OF SYSTEMS: Full 14 system review of systems performed and notable only for weight gain, murmur, spinning sensation, cough, wheezing, increased thirst, joint pain, allergies, runny nose, headaches, tremor, insomnia, depression, anxiety, not enough sleep, racing thoughts  ALLERGIES: Allergies  Allergen Reactions  . Methadone Shortness Of Breath and Other (See Comments)    Chest Pain  . Nitrofurantoin Shortness Of Breath and Other (See Comments)    Chest Pain  . Percocet [Oxycodone-Acetaminophen] Shortness Of Breath and Other (See Comments)    Chest pain  . Codeine Nausea And Vomiting  . Ms Contin  [name]] Nausea And Vomiting  . Penicillins Nausea And Vomiting, Rash and Other (See Comments)    Fever, including amoxicillin   . Amoxicillin Rash  . Aspirin Other (See Comments)    Reaction: stomach pain  . Clarithromycin Rash and Other (See Comments)    Fever    HOME MEDICATIONS: Outpatient Prescriptions Prior to Visit  Medication Sig Dispense Refill  . albuterol (PROVENTIL) (2.5 MG/3ML) 0.083% nebulizer solution Take 3 mLs (2.5 mg total) by nebulization every 6 (six) hours as needed. DX:  493.00 FILE WITH MCR PART B  300 mL  5  . buPROPion (WELLBUTRIN XL) 150 MG 24 hr tablet Take 3 tablets by mouth daily to = 450mg       . cetirizine (ZYRTEC) 10 MG tablet Take 10 mg by mouth every morning.        . clonazePAM (KLONOPIN) 0.5 MG tablet Take 0.5 mg by mouth 3 (three) times daily as needed.       . DULoxetine (CYMBALTA) 30 MG capsule Take 30 mg by mouth daily.       . DULoxetine (CYMBALTA) 60 MG capsule Take 60 mg by mouth 2 (two) times daily. Taken at lunch time.      . eszopiclone (LUNESTA) 2 MG TABS Take 2 mg by mouth at bedtime. Take immediately before bedtime       . gabapentin (NEURONTIN) 100 MG capsule Take 100 mg by mouth 2 (two) times daily.       Marland Kitchen ibuprofen (ADVIL,MOTRIN) 600 MG tablet Take 600 mg by mouth 2 (two) times daily as needed. 08/20/11 per pt, taking in morning and at night per MD instructions      .  lidocaine (LIDODERM) 5 % Place 1 patch onto the skin daily as needed. Remove & Discard patch within 12 hours or as directed by MD      . losartan (COZAAR) 50 MG tablet Take 50 mg by mouth daily.      . meclizine (ANTIVERT) 25 MG tablet Take 1 tablet by mouth as needed.      . metFORMIN (GLUCOPHAGE) 500 MG tablet Take 500 mg by mouth daily after breakfast.      . mometasone-formoterol (DULERA) 100-5 MCG/ACT AERO Inhale 2 puffs into the lungs 2 (two) times daily.      . montelukast (SINGULAIR) 10 MG tablet Take 1 tablet by mouth Daily.      Marland Kitchen omeprazole (PRILOSEC) 20  MG capsule Take 1 capsule (20 mg total) by mouth 2 (two) times daily.  180 capsule  2  . PROAIR HFA 108 (90 BASE) MCG/ACT inhaler INHALE 2 PUFFS INTO THE LUNGS EVERY 6 HOURS AS NEEDED  1 Inhaler  6  . promethazine (PHENERGAN) 25 MG tablet Take 25 mg by mouth every 6 (six) hours as needed.      . traMADol (ULTRAM) 50 MG tablet Take 1-2 two times a day as needed for knee pain  60 tablet  1  . traZODone (DESYREL) 50 MG tablet Take 25 mg by mouth at bedtime.       . ziprasidone (GEODON) 20 MG capsule Take 20 mg by mouth every morning.      . ziprasidone (GEODON) 40 MG capsule Take by mouth. Taking 40 at dinner      . ziprasidone (GEODON) 80 MG capsule Take 80 mg by mouth at bedtime. Taking 80 mg at bedtime      . benztropine (COGENTIN) 0.5 MG tablet TAKE 1 TABLET BY MOUTH AT BEDTIME *NEED OFFICE VISIT*  30 tablet  0  . Cholecalciferol 50000 UNIT capsule Take 50,000 Units by mouth as directed. Take 1 every week      . cycloSPORINE (RESTASIS) 0.05 % ophthalmic emulsion Place 1 drop into both eyes 2 (two) times daily.      . Multiple Vitamins-Minerals ([facility] DAILY MULTIPLE WOMEN 50+ PO) Take by mouth daily.        Marland Kitchen Propylene Glycol (SYSTANE BALANCE OP) Apply to eye.      Marland Kitchen QUEtiapine Fumarate (SEROQUEL XR) 150 MG 24 hr tablet Take 150 mg by mouth at bedtime.       No facility-administered medications prior to visit.    PAST MEDICAL HISTORY: Past Medical History  Diagnosis Date  . Asthma   . GERD (gastroesophageal reflux disease)   . Chronic pain syndrome   . Migraine   . Gastroparesis     botox injections  . Anorexia nervosa   . Diabetes mellitus   . Multiple allergies   . Migraine   . Personality change due to conditions classified elsewhere   . Hyperglycemia   . Painful respiration   . Raynaud's syndrome   . Encounter for long-term (current) use of other medications   . Ventral hernia   . Vitamin D deficiency   . Tremor   . Arthritis     bilateral knees  . Murmur, cardiac      PAST SURGICAL HISTORY: Past Surgical History  Procedure Laterality Date  . Nissen fundoplication  2005    [name]  . Cholecystectomy    . Peg tube placement      removed 1996  . Tonsillectomy and adenoidectomy    . Hernia  repair    . Esophagogastroduodenoscopy  08/20/2011    Procedure: ESOPHAGOGASTRODUODENOSCOPY (EGD);  Surgeon: Barrie Folk, MD;  Location: [facility] ENDOSCOPY;  Service: Endoscopy;  Laterality: N/A;  . Esophagogastroduodenoscopy N/A 11/18/2012    Procedure: ESOPHAGOGASTRODUODENOSCOPY (EGD);  Surgeon: Barrie Folk, MD;  Location: [facility] ENDOSCOPY;  Service: Endoscopy;  Laterality: N/A;  . Botox injection N/A 11/18/2012    Procedure: BOTOX INJECTION;  Surgeon: Barrie Folk, MD;  Location: [facility] ENDOSCOPY;  Service: Endoscopy;  Laterality: N/A;    FAMILY HISTORY: Family History  Problem Relation Age of Onset  . Emphysema Paternal Grandfather     was a smoker  . Diabetes Father   . High blood pressure Father   . Cancer - Prostate Father   . Urinary tract infection Mother   . Sleep disorder Mother     SOCIAL HISTORY:  History   Social History  . Marital Status: Single    Spouse Name: N/A    Number of Children: 0  . Years of Education: college   Occupational History  . Disabled    Social History Main Topics  . Smoking status: Former Smoker -- 0.50 packs/day for 2 years    Types: Cigarettes    Quit date: 02/11/1993  . Smokeless tobacco: Never Used  . Alcohol Use: No     Comment: quit ETOH in 1982  . Drug Use: No  . Sexual Activity: Not on file   Other Topics Concern  . Not on file   Social History Narrative   Patient lives at home alone divorced.   Disabled   [employer] education.   Right handed   Caffeine- 48oz diet coke              PHYSICAL EXAM   Filed Vitals:   01/12/13 1104  BP: 121/84  Pulse: 70   There is no weight on file to calculate BMI.   Generalized: In no acute distress  Neck: Supple, no carotid bruits   Cardiac: Regular  rate rhythm  Pulmonary: Clear to auscultation bilaterally  Musculoskeletal: No deformity  Neurological examination  Mentation: Alert oriented to time, place, history taking, and causual conversation  Cranial nerve II-XII: Pupils were equal round reactive to light extraocular movements were full, visual field were full on confrontational test. facial sensation and strength were normal. hearing was intact to finger rubbing bilaterally. Uvula tongue midline.  head turning and shoulder shrug and were normal and symmetric.Tongue protrusion into cheek strength was normal.  Motor:  Mild right more than left rigidity, no significant weakness.  She also has mild nuchal rigidity.  Sensory: Intact to fine touch, pinprick, preserved vibratory sensation, and proprioception at toes.  Coordination: Normal finger to nose, heel-to-shin bilaterally there was no truncal ataxia  Gait: Rising up from seated position without assistance, normal stance, without trunk ataxia, moderate stride, good arm swing, smooth turning, able to perform tiptoe, and heel walking without difficulty.   Romberg signs: Negative  Deep tendon reflexes: Brachioradialis 2/2, biceps 2/2, triceps 2/2, patellar 2/2, Achilles 2/2, plantar responses were flexor bilaterally.   DIAGNOSTIC DATA (LABS, IMAGING, TESTING) - I reviewed patient records, labs, notes, testing and imaging myself where available.  Lab Results  Component Value Date   HGB 13.3 09/13/2009   HCT 39.4 09/13/2009      Component Value Date/Time   NA 139 09/13/2009   K 4.6 09/13/2009   CL 103 04/06/2008   CO2 32 04/06/2008   GLUCOSE 125 09/13/2009   BUN 11  04/06/2008   CREATININE 1.1 04/06/2008   CALCIUM 9.6 04/06/2008   PROT 6.7 11/24/2006 0953   AST 16 11/24/2006 0953   ALT 9 11/24/2006 0953   ALKPHOS 84 11/24/2006 0953   BILITOT 0.3 11/24/2006 0953   GFRNONAA 56 04/06/2008   Lab Results  Component Value Date   CHOL 220 09/13/2009   HDL 70 09/13/2009   LDLCALC 540  09/13/2009   TRIG 159 09/13/2009   Lab Results  Component Value Date   HGBA1C 5.5 09/13/2009   Lab Results  Component Value Date   VITAMINB12 595 04/06/2008   Lab Results  Component Value Date   TSH 3.67 09/19/2009     ASSESSMENT AND PLAN   57 years old Caucasian female, with a long-standing history of psychiatric disease, on multiple psychotic medications, including dopamine antagonist Geodon, with mild parkinsonism features, including right hand tremor, slight rigidity, has been very stable, doing very well, is taking Cogentin to 0.5 mg every night, I will refill her medication medication for one year, she is to continue refill her medication by her primary care or psychiatrist, only return to clinic if new issues arise.      Levert Feinstein, M.D. Ph.D.  [name] [address], [address] [place], [place] [address] [phone]

## 2013-01-25 ENCOUNTER — Encounter: Payer: Self-pay | Admitting: Family Medicine

## 2013-01-25 ENCOUNTER — Ambulatory Visit (INDEPENDENT_AMBULATORY_CARE_PROVIDER_SITE_OTHER): Payer: Medicare Other | Admitting: Family Medicine

## 2013-01-25 VITALS — Ht 60.5 in | Wt 164.9 lb

## 2013-01-25 DIAGNOSIS — E119 Type 2 diabetes mellitus without complications: Secondary | ICD-10-CM

## 2013-01-25 DIAGNOSIS — F509 Eating disorder, unspecified: Secondary | ICD-10-CM

## 2013-01-25 NOTE — Progress Notes (Signed)
Medical Nutrition Therapy:  Appt start time: 1030 end time:  1100.  Assessment:  Primary concerns today: Elevated blood glucose (pre-DM) levels and eating disorder. Shanise said she has not been doing well w/ respect to eating and exercise.  Her sister Natalia Leatherwood has been too busy to walk with [name], and she has been unmotivated to go on her own.  She has not been grocery shopping recently, so does not have much food at home.  Emmajean is somewhat depressed with the holidays, and current stressors include both of her parents whose health is frail, ongoing financial constraints, and a recent ear infection, which has left her w/ significant fluid in her ears.  Perhaps overriding all of the above is the belief that anything she does to try to lose weight will be fruitless while she continues on Seroquel and Geodon.  Alajah had discontinued Seroquel, but re-started (at 50 mg vs. 150 old dose) b/c she was unable to sleep despite continued use of Lunesta, and she even had started having hallucinations while not taking Seroquel.      I pointed out to Jodye that she has not practiced the behaviors we know are associated with wt loss with consistency for any prolonged period of time.  Therefore, it is premature to conclude she cannot lose weight while taking these med's.  She agreed.    24-hr recall:  (Up at 8 AM) B (9 AM)-  1/2 c cashews, 1 c orange juice  Snk ( AM)-  none L (12:30 PM)-  Smart One frozen dinner (220 kcal), 20 oz diet soda Snk ( PM)-  none D (7 PM)-  1 c veg-fried rice, 20 oz diet soda Snk ( PM)-  none  Progress:  In Progress.    Nutritional Diagnosis:  Regression on NB-1.5 Disordered eating pattern as related to weight anxiety as evidenced by continued inconsistency in eating pattern.      Intervention:  Nutrition counseling.     Monitoring/Evaluation:  Dietary intake, body weight, and physical activity in 4 weeks.

## 2013-01-25 NOTE — Patient Instructions (Addendum)
-   Juice of any kind should be consumed only occasionally, and it's best to have no more than 4 oz at a time.  This is a concentrated source of carb's!  Practice over the next few weeks, when you are making a food decision: 1. What AM I feeling now? 2. How do I WANT to feel? 3. What do I really NEED right now? - Email [name] what your answers are to these Qs in the next few days.   - Start back on yogurt daily since your antibiotics course.   - Monitor your exercise, i.e., how many minutes and how often?

## 2013-02-02 ENCOUNTER — Other Ambulatory Visit: Payer: Self-pay | Admitting: *Deleted

## 2013-02-02 MED ORDER — TRAMADOL HCL 50 MG PO TABS: ORAL_TABLET | ORAL | Status: DC

## 2013-03-01 ENCOUNTER — Ambulatory Visit (INDEPENDENT_AMBULATORY_CARE_PROVIDER_SITE_OTHER): Payer: Medicare Other | Admitting: Critical Care Medicine

## 2013-03-01 ENCOUNTER — Encounter: Payer: Self-pay | Admitting: Family Medicine

## 2013-03-01 ENCOUNTER — Encounter: Payer: Self-pay | Admitting: Critical Care Medicine

## 2013-03-01 ENCOUNTER — Ambulatory Visit (INDEPENDENT_AMBULATORY_CARE_PROVIDER_SITE_OTHER): Payer: Medicare Other | Admitting: Family Medicine

## 2013-03-01 VITALS — Ht 60.5 in | Wt 165.0 lb

## 2013-03-01 VITALS — BP 146/90 | HR 79 | Temp 98.4°F | Ht 60.5 in | Wt 170.6 lb

## 2013-03-01 DIAGNOSIS — E119 Type 2 diabetes mellitus without complications: Secondary | ICD-10-CM

## 2013-03-01 DIAGNOSIS — H8110 Benign paroxysmal vertigo, unspecified ear: Secondary | ICD-10-CM | POA: Insufficient documentation

## 2013-03-01 DIAGNOSIS — F509 Eating disorder, unspecified: Secondary | ICD-10-CM

## 2013-03-01 DIAGNOSIS — J45909 Unspecified asthma, uncomplicated: Secondary | ICD-10-CM

## 2013-03-01 DIAGNOSIS — H6590 Unspecified nonsuppurative otitis media, unspecified ear: Secondary | ICD-10-CM | POA: Insufficient documentation

## 2013-03-01 HISTORY — DX: Benign paroxysmal vertigo, unspecified ear: H81.10

## 2013-03-01 MED ORDER — FLUTICASONE PROPIONATE 50 MCG/ACT NA SUSP: 2.0000 | Freq: Every day | NASAL | Status: DC

## 2013-03-01 MED ORDER — ALBUTEROL SULFATE HFA 108 (90 BASE) MCG/ACT IN AERS: INHALATION_SPRAY | RESPIRATORY_TRACT | Status: DC

## 2013-03-01 MED ORDER — MOMETASONE FURO-FORMOTEROL FUM 100-5 MCG/ACT IN AERO: 2.0000 | INHALATION_SPRAY | Freq: Two times a day (BID) | RESPIRATORY_TRACT | Status: DC

## 2013-03-01 NOTE — Assessment & Plan Note (Signed)
Severe persistent asthma with atopic and reflux precipitating factors Plan Maintain inhaled medications as prescribed

## 2013-03-01 NOTE — Progress Notes (Signed)
Medical Nutrition Therapy:  Appt start time: 1030 end time:  1100.  Assessment:  Primary concerns today: Elevated blood glucose (pre-DM) levels and eating disorder. Becky [name] has been suffering from pretty severe depression, worries about her parents' wellbeing, and no motivation to do much of anything.  She has had no appetite, so has been skipping many meals.  Paradoxically, her weight is up slightly.  Becky [name] has not written any answers to the 3 Decoding Qs provided at last appt, so we went through a series of Qs together today, focusing on when she was trying to eat breakfast this morning (which ended after 2 bites of Greek yogurt).  It was significant that Becky [name] had a very hard time coming up with ANY feelings, including how she actually felt and what feelings or sensations are evoked when having any pleasant food experience or when restricting.  She has not seen her therapist Becky [name] since November.  Becky [name] did say today that she usually does not voice how she feels, even to her therapist and psychiatrist, and she acknowledged today that she probably would make better use of her time with them if she did.  I believe that Becky [name] is emotionally numb much of the time.  One has to wonder how much of this can be attributed to medications she takes and how much to past dysfunction and trauma.  In all of the Qs asked of her today, she was able to come up with only one feeling attached to any experience:  Her grandfather "taught" her to eat scrambled eggs when she was young.  She likes this food today, in part b/c of the association.    Progress:  In Progress.    Nutritional Diagnosis:  Regression on NB-1.5 Disordered eating pattern as related to weight anxiety as evidenced by increased frequency of skipped meals.      Intervention:  Nutrition counseling.     Monitoring/Evaluation:  Dietary intake, body weight, and physical activity in 4 weeks.

## 2013-03-01 NOTE — Progress Notes (Signed)
Subjective:    Patient ID: Becky [name], female    DOB: 11-22-1955, 58 y.o.   MRN: [id number]  HPI   Becky [name] is a 58 y.o.  pleasant female with severe persistent asthma with atopic and reflux precipitating factors. She is a former smoker.  03/01/2013 Chief Complaint  Patient presents with  . 6 month follow up     Breathing has worsened some with the cold weather - is coughing a lot and SOB with coughing spells.  No wheezing.     Worse in the cold air, more wheezing. Some mucus with cough. Not spitting out mucus. PUL ASTHMA HISTORY 03/01/2013 09/09/2012 01/19/2012 08/14/2011 04/10/2011  Symptoms Daily >2 days/week 0-2 days/week >2 days/week Daily  Nighttime awakenings 0-2/month 0-2/month 0-2/month 0-2/month Often--7/wk  Interference with activity Minor limitations No limitations No limitations Minor limitations Some limitations  SABA use 0-2 days/wk 0-2 days/wk 0-2 days/wk 0-2 days/wk Daily  Exacerbations requiring oral steroids 2 or more / year 0-1 / year 0-1 / year 0-1 / year 2 or more / year    Review of Systems  Constitutional: Positive for fatigue. Negative for fever, chills and diaphoresis.  HENT: Positive for congestion, postnasal drip and rhinorrhea. Negative for sneezing.   Respiratory: Positive for cough, chest tightness and shortness of breath. Negative for wheezing.   Cardiovascular: Negative for chest pain.       Objective:   Physical Exam   VITAL SIGNS:   BP 146/90  Pulse 79  Temp(Src) 98.4 F (36.9 C) (Oral)  Ht 5' 0.5" (1.537 m)  Wt 170 lb 9.6 oz (77.384 kg)  BMI 32.76 kg/m2  SpO2 97%  HEENT: Head is normocephalic and atraumatic. Extraocular muscles are intact. Pupils are equal, round, and reactive to light. Nares appeared normal. Mouth is well hydrated and without lesions. Enlarged turbinates. Mucous membranes are moist. Posterior pharynx clear of any exudate or lesions. Tonsils are removed. NECK: Supple. No carotid bruits. No  lymphadenopathy or thyromegaly. LUNGS: Clear to auscultation. HEART: Regular rate and rhythm without murmur.  ABDOMEN: Soft, nontender, and nondistended. Positive bowel sounds. No hepatosplenomegaly was noted.  EXTREMITIES: Without any cyanosis, clubbing, rash, lesions or edema.  NEUROLOGIC: Cranial nerves II through XII are grossly intact.  SKIN: No ulceration or induration present.        Assessment & Plan:   Severe persistent asthma with atopic and reflux precipitating factors Severe persistent asthma with atopic and reflux precipitating factors Plan Maintain inhaled medications as prescribed    Updated Medication List Outpatient Encounter Prescriptions as of 03/01/2013  Medication Sig  . albuterol (PROAIR HFA) 108 (90 BASE) MCG/ACT inhaler INHALE 2 PUFFS INTO THE LUNGS EVERY 6 HOURS AS NEEDED  . albuterol (PROVENTIL) (2.5 MG/3ML) 0.083% nebulizer solution Take 3 mLs (2.5 mg total) by nebulization every 6 (six) hours as needed. DX:  493.00 FILE WITH MCR PART B  . benztropine (COGENTIN) 0.5 MG tablet Take 1 tablet (0.5 mg total) by mouth daily.  Marland Kitchen buPROPion (WELLBUTRIN XL) 150 MG 24 hr tablet Take 3 tablets by mouth daily to = 450mg   . cetirizine (ZYRTEC) 10 MG tablet Take 10 mg by mouth every morning.    . clonazePAM (KLONOPIN) 0.5 MG tablet Take 0.5 mg by mouth 4 (four) times daily.   . DULoxetine (CYMBALTA) 30 MG capsule Take 30 mg by mouth daily.   . DULoxetine (CYMBALTA) 60 MG capsule Take 120 mg by mouth daily. Taken at lunch time.  . eszopiclone (LUNESTA) 2 MG TABS  Take 2 mg by mouth at bedtime. Take immediately before bedtime   . fluticasone (FLONASE) 50 MCG/ACT nasal spray Place 2 sprays into both nostrils daily.  Marland Kitchen gabapentin (NEURONTIN) 100 MG capsule Take 100 mg by mouth 2 (two) times daily.   Marland Kitchen ibuprofen (ADVIL,MOTRIN) 600 MG tablet Take 600 mg by mouth 2 (two) times daily as needed. 08/20/11 per pt, taking in morning and at night per MD instructions  . lidocaine  (LIDODERM) 5 % Place 1 patch onto the skin daily as needed. Remove & Discard patch within 12 hours or as directed by MD  . losartan (COZAAR) 50 MG tablet Take 50 mg by mouth daily.  . meclizine (ANTIVERT) 25 MG tablet Take 1 tablet by mouth as needed.  . metFORMIN (GLUCOPHAGE) 500 MG tablet Take 500 mg by mouth daily after breakfast.  . mometasone-formoterol (DULERA) 100-5 MCG/ACT AERO Inhale 2 puffs into the lungs 2 (two) times daily.  . montelukast (SINGULAIR) 10 MG tablet Take 1 tablet by mouth Daily.  Marland Kitchen omeprazole (PRILOSEC) 20 MG capsule Take 1 capsule (20 mg total) by mouth 2 (two) times daily.  . promethazine (PHENERGAN) 25 MG tablet Take 25 mg by mouth every 6 (six) hours as needed.  . traMADol (ULTRAM) 50 MG tablet Take 1-2 two times a day as needed for knee pain  . traZODone (DESYREL) 50 MG tablet Take 50 mg by mouth at bedtime.   . ziprasidone (GEODON) 20 MG capsule Take 20 mg by mouth every morning.  . ziprasidone (GEODON) 40 MG capsule Take by mouth. Taking 40 at dinner  . ziprasidone (GEODON) 80 MG capsule Take 80 mg by mouth at bedtime. Taking 80 mg at bedtime  . [DISCONTINUED] fluticasone (FLONASE) 50 MCG/ACT nasal spray Place 2 sprays into the nose daily.   . [DISCONTINUED] mometasone-formoterol (DULERA) 100-5 MCG/ACT AERO Inhale 2 puffs into the lungs 2 (two) times daily.  . [DISCONTINUED] PROAIR HFA 108 (90 BASE) MCG/ACT inhaler INHALE 2 PUFFS INTO THE LUNGS EVERY 6 HOURS AS NEEDED  . [DISCONTINUED] azithromycin (ZITHROMAX) 250 MG tablet Take 250 mg by mouth daily.

## 2013-03-01 NOTE — Patient Instructions (Signed)
No change in medications. Return in        6 months        

## 2013-03-01 NOTE — Patient Instructions (Signed)
-   Answer the last column questions on the handout you are getting today:   A. How can you incorporate more of these experiences, and those in Column 6, on a regular basis? (Add more times or days, or break the activity into smaller increments. B. Symbolic reminders-See Chart -Bring in, or write or draw images or symbols and keep it in a special place.   - Work also on the three questions you had at last appt any time you are struggling with a food decision:  1. What am I feeling now?  2. What do I want to feel?  3. What do I truly need right now?

## 2013-03-29 ENCOUNTER — Ambulatory Visit (INDEPENDENT_AMBULATORY_CARE_PROVIDER_SITE_OTHER): Payer: Medicare Other | Admitting: Family Medicine

## 2013-03-29 ENCOUNTER — Encounter: Payer: Self-pay | Admitting: Family Medicine

## 2013-03-29 VITALS — Ht 60.5 in | Wt 159.7 lb

## 2013-03-29 DIAGNOSIS — F509 Eating disorder, unspecified: Secondary | ICD-10-CM

## 2013-03-29 DIAGNOSIS — E119 Type 2 diabetes mellitus without complications: Secondary | ICD-10-CM

## 2013-03-29 NOTE — Progress Notes (Signed)
Medical Nutrition Therapy:  Appt start time: 1030 end time:  1100.  Assessment:  Primary concerns today: Elevated blood glucose (pre-DM) levels and eating disorder. Becky [name] has been prescribed a diuretic b/c of elevated BP.  She has been getting daily headaches, which have not been helped by the diuretic.  Saturday her BP was 132/102; Friday was 139/97.  She has cut out soda b/c of its caffeine content.  Was getting 4-6 X 20 oz diet soda daily.  Becky [name] is not walking much; just walking her puppy Becky [name] for short times.  She hopes to re-start walking with her sister.    24-hr recall:  (Up at 5 AM) B (9 AM)-  3/4 c Special K protein cereal (10 g pro), 3/4 c soy milk Snk ( AM)-  water L (2 PM)-  4 stalks celery Snk ( PM)-  water D (6 PM)-  Becky [name]: 4 slc chs [name], water Snk ( PM)-  water Becky [name] did not have a specific answer as to why lunch was only celery yesterday.  We again talked about the importance of regular meals and adequate protein.    Progress:  In Progress.    Nutritional Diagnosis:  No progress on NB-1.5 Disordered eating pattern as related to weight anxiety as evidenced by continued frequency of skipped meals.      Intervention:  Nutrition counseling.     Monitoring/Evaluation:  Dietary intake, body weight, and physical activity in 4 weeks.

## 2013-03-29 NOTE — Patient Instructions (Signed)
From January appt: - Answer the last column questions on the handout you are getting today:  A. How can you incorporate more of these experiences, and those in Column 6, on a regular basis? (Add more  times or days, or break the activity into smaller increments.  B. Symbolic reminders-See Chart -Bring in, or write or draw images or symbols and keep it in a special place.  - Work also on the three questions you had at last appt any time you are struggling with a food decision:  1. What am I feeling now?  2. What do I want to feel?  3. What do I truly need right now?  - This month's goal:  Consistent meal times, and consistency with respect to BALANCED meals.  In other words, every meal includes at least a serving of protein and a serving of veg or fruit.  In addn, you actually need some starch at at least 2 of those meals.  (Fruit and dairy make great snacks in-between.)  - Protein becomes increasingly important as you get older.  Aim for your 55 grams a day! - Email [name] no later than Mar 2 to let me know if:  1. You have answered the 3 Qs at least a few times.   2. How is the protein going?   Next appts: Mar 16 at 11 AM  Apr 20 at 9:30 AM

## 2013-04-11 DIAGNOSIS — M8580 Other specified disorders of bone density and structure, unspecified site: Secondary | ICD-10-CM | POA: Insufficient documentation

## 2013-04-11 HISTORY — DX: Other specified disorders of bone density and structure, unspecified site: M85.80

## 2013-04-19 ENCOUNTER — Other Ambulatory Visit: Payer: Self-pay | Admitting: *Deleted

## 2013-04-20 ENCOUNTER — Other Ambulatory Visit: Payer: Self-pay | Admitting: Family Medicine

## 2013-04-20 MED ORDER — TRAMADOL HCL 50 MG PO TABS: ORAL_TABLET | ORAL | Status: DC

## 2013-04-20 NOTE — Progress Notes (Signed)
Dear Dema Severin Team please call this in w 3 refills as below [facility]! Becky [name]

## 2013-04-26 ENCOUNTER — Encounter: Payer: Self-pay | Admitting: Family Medicine

## 2013-04-26 ENCOUNTER — Ambulatory Visit (INDEPENDENT_AMBULATORY_CARE_PROVIDER_SITE_OTHER): Payer: Medicare Other | Admitting: Family Medicine

## 2013-04-26 VITALS — Ht 60.25 in | Wt 158.6 lb

## 2013-04-26 DIAGNOSIS — E119 Type 2 diabetes mellitus without complications: Secondary | ICD-10-CM

## 2013-04-26 DIAGNOSIS — F509 Eating disorder, unspecified: Secondary | ICD-10-CM

## 2013-04-26 NOTE — Patient Instructions (Signed)
-   Grocery store on your way home today:    - Fruit   - Sweet potatoes  - Cereal  - Tuna   - Soup  - Peanut butter   - Bread  - Crackers/rice cakes  - Cheese  - Protein bars  - Food goals:   1. Vegetables twice a day.   2. Protein with every meal (55 grams).  3. Three meals a day and at least one snack.    - CONSISTENCY with your goals!  - Please schedule at the front desk for June 1 at 10:30.

## 2013-04-26 NOTE — Progress Notes (Signed)
Medical Nutrition Therapy:  Appt start time: 1100 end time:  1130.  Assessment:  Primary concerns today: Elevated blood glucose (pre-DM) levels and eating disorder. Becky [name] has not been to the grocery store this month; she has not been eating much recently.  She has been walking a little more with her dog, ~15 min per day.  She hopes to get to the park more often, bringing her Morris who now lives with her ex-father-in-law, and who has recently been diagnosed with aggressive cancer.  Learning about this dx has impacted Becky [name]'s appetite.  She feels her depression has been bad since last year; following the winter months, she continued to feel a lot of stress related to her parent's house repair and sale, for which she was responsible.  Last month she got a light box, which she is now using 30-60 min a day, hoping this may help with seasonal affective D/O.   Dr. Redmond Pulling recently prescribed Lopressor for BP, which has been elevated.  Headaches have been less frequent since starting Lopressor.    24-hr recall:  (Up at 7 AM) B (8 AM)-  1 protein bar (15 g pro, 210 kcal), water Snk ( AM)-  diet tea L (4 PM)-  2 slc small cheese pizza, unsweet tea  Snk ( PM)-  diet tea D (8 PM)-  2 slc small cheese pizza, diet tea Snk ( PM)-  diet tea Yesterday included lunch out, which is not normal.  Usual intake lately includes 2-3 meals a day, occasional snacks of protein bar or apple.  Lunches have been more like snacks.  She has been getting veg's only a few times a week.    Progress:  In Progress.    Nutritional Diagnosis:  No progress on NB-1.5 Disordered eating pattern as related to weight anxiety as evidenced by continued frequency of skipped meals.      Intervention:  Nutrition counseling.     Monitoring/Evaluation:  Dietary intake, body weight, and physical activity in 4 weeks.

## 2013-05-07 ENCOUNTER — Other Ambulatory Visit (HOSPITAL_COMMUNITY): Payer: Self-pay | Admitting: Orthopedic Surgery

## 2013-05-07 ENCOUNTER — Other Ambulatory Visit (HOSPITAL_COMMUNITY): Payer: Self-pay | Admitting: *Deleted

## 2013-05-07 DIAGNOSIS — M899 Disorder of bone, unspecified: Secondary | ICD-10-CM

## 2013-05-07 DIAGNOSIS — M949 Disorder of cartilage, unspecified: Principal | ICD-10-CM

## 2013-05-10 ENCOUNTER — Ambulatory Visit (HOSPITAL_COMMUNITY)
Admission: RE | Admit: 2013-05-10 | Discharge: 2013-05-10 | Disposition: A | Discharge status: Home / Self Care | Payer: Medicare Other | Source: Ambulatory Visit | Attending: Orthopedic Surgery | Admitting: Orthopedic Surgery

## 2013-05-10 ENCOUNTER — Ambulatory Visit: Payer: Medicare Other | Admitting: Family Medicine

## 2013-05-10 DIAGNOSIS — M899 Disorder of bone, unspecified: Secondary | ICD-10-CM

## 2013-05-10 DIAGNOSIS — M949 Disorder of cartilage, unspecified: Secondary | ICD-10-CM

## 2013-05-10 DIAGNOSIS — Z780 Asymptomatic menopausal state: Secondary | ICD-10-CM | POA: Insufficient documentation

## 2013-05-10 DIAGNOSIS — Z13820 Encounter for screening for osteoporosis: Secondary | ICD-10-CM | POA: Insufficient documentation

## 2013-05-31 ENCOUNTER — Encounter: Payer: Self-pay | Admitting: Family Medicine

## 2013-05-31 ENCOUNTER — Ambulatory Visit (INDEPENDENT_AMBULATORY_CARE_PROVIDER_SITE_OTHER): Payer: Medicare Other | Admitting: Family Medicine

## 2013-05-31 VITALS — Ht 60.25 in | Wt 155.4 lb

## 2013-05-31 DIAGNOSIS — F509 Eating disorder, unspecified: Secondary | ICD-10-CM

## 2013-05-31 DIAGNOSIS — E119 Type 2 diabetes mellitus without complications: Secondary | ICD-10-CM

## 2013-05-31 NOTE — Patient Instructions (Addendum)
-   Sports Med doc's in town:  [place] - Any physicians (Dr. Nori Riis).     Charlann Boxer at [facility]). - Answer the three Qs when you are struggling with a food decision:  How do I feel?  How do I WANT to feel?  What do I truly need right now? - Use your feelings and needs lists to help answer.   - Bring your written answers to next appt.    - Food goals:  1. Eat at least 3 meals and 1-2 snacks per day.  Aim for no more than 5 hours between eating.   2. Each meal should include a source of protein, (starch), and veg and/or fruit.    3. Daily food records to help you stay on track with getting three meals a day.    CONSISTENCY!  - Appts:   June 15 at 11    July 13 at 10:30

## 2013-05-31 NOTE — Progress Notes (Signed)
Medical Nutrition Therapy:  Appt start time: 0930 end time:  1000.  Assessment:  Primary concerns today: Elevated blood glucose (pre-DM) levels and eating disorder. Becky [name] has been having severe hip pain, which orthopedist Becky [name] says is back-related.  He prescribed hydrocodone, and told Becky [name] to walk as tolerated.  Her hip has been too sore to walk, however.  Becky [name] has been eating 3 meals a day only about 5 X wk recently.  She feels she needs to get back to daily food records b/c it helps her to remember each meal.    24-hr recall:  (Up at 9:30 AM) B (10 AM)-  1 c Special K Protein cereal, 6 oz soymilk Snk ( AM)-  none L (1 PM)-  Peanut butter & ban sandwich, carrots, water Snk (3:30)-  1 apple, 40 oz Crystal Light tea D (6:30 PM)-  1 Boca Burger patty, carrots Snk ( PM)-  1 apple  Progress:  In Progress.    Nutritional Diagnosis:  Slight progress on NB-1.5 Disordered eating pattern as related to weight anxiety as evidenced by reduced frequency of skipped meals.      Intervention:  Nutrition counseling.     Monitoring/Evaluation:  Dietary intake, body weight, and physical activity in 8 weeks.

## 2013-06-10 ENCOUNTER — Other Ambulatory Visit (HOSPITAL_COMMUNITY): Payer: Self-pay | Admitting: Orthopedic Surgery

## 2013-06-10 ENCOUNTER — Ambulatory Visit (HOSPITAL_COMMUNITY)
Admission: RE | Admit: 2013-06-10 | Discharge: 2013-06-10 | Disposition: A | Discharge status: Home / Self Care | Payer: Medicare Other | Source: Ambulatory Visit | Attending: Orthopedic Surgery | Admitting: Orthopedic Surgery

## 2013-06-10 ENCOUNTER — Encounter (HOSPITAL_COMMUNITY): Payer: Self-pay

## 2013-06-10 DIAGNOSIS — M949 Disorder of cartilage, unspecified: Principal | ICD-10-CM

## 2013-06-10 DIAGNOSIS — M899 Disorder of bone, unspecified: Secondary | ICD-10-CM | POA: Insufficient documentation

## 2013-06-10 MED ORDER — ZOLEDRONIC ACID 5 MG/100ML IV SOLN
5.0000 mg | Freq: Once | INTRAVENOUS | Status: AC
  Administered 2013-06-10: 5 mg via INTRAVENOUS
  Filled 2013-06-10: qty 100

## 2013-06-10 MED ORDER — SODIUM CHLORIDE 0.9 % IV SOLN
Freq: Once | INTRAVENOUS | Status: AC
  Administered 2013-06-10: 12:00:00 via INTRAVENOUS

## 2013-06-10 NOTE — Discharge Instructions (Signed)
Drink  Fluids/water as tolerated over the next 72 hours °Tylenol or ibuprofen OTC as directed °Continue Calcium and Vit D as directed by your MD ° °Zoledronic Acid injection (Paget's Disease, Osteoporosis) °What is this medicine? °ZOLEDRONIC ACID (ZOE le dron ik AS id) lowers the amount of calcium loss from bone. It is used to treat Paget's disease and osteoporosis in women. °This medicine may be used for other purposes; ask your health care provider or pharmacist if you have questions. °COMMON BRAND NAME(S): Reclast, Zometa °What should I tell my health care provider before I take this medicine? °They need to know if you have any of these conditions: °-aspirin-sensitive asthma °-cancer, especially if you are receiving medicines used to treat cancer °-dental disease or wear dentures °-infection °-kidney disease °-low levels of calcium in the blood °-past surgery on the parathyroid gland or intestines °-receiving corticosteroids like dexamethasone or prednisone °-an unusual or allergic reaction to zoledronic acid, other medicines, foods, dyes, or preservatives °-pregnant or trying to get pregnant °-breast-feeding °How should I use this medicine? °This medicine is for infusion into a vein. It is given by a health care professional in a hospital or clinic setting. °Talk to your pediatrician regarding the use of this medicine in children. This medicine is not approved for use in children. °Overdosage: If you think you have taken too much of this medicine contact a poison control center or emergency room at once. °NOTE: This medicine is only for you. Do not share this medicine with others. °What if I miss a dose? °It is important not to miss your dose. Call your doctor or health care professional if you are unable to keep an appointment. °What may interact with this medicine? °-certain antibiotics given by injection °-NSAIDs, medicines for pain and inflammation, like ibuprofen or naproxen °-some diuretics like  bumetanide, furosemide °-teriparatide °This list may not describe all possible interactions. Give your health care provider a list of all the medicines, herbs, non-prescription drugs, or dietary supplements you use. Also tell them if you smoke, drink alcohol, or use illegal drugs. Some items may interact with your medicine. °What should I watch for while using this medicine? °Visit your doctor or health care professional for regular checkups. It may be some time before you see the benefit from this medicine. Do not stop taking your medicine unless your doctor tells you to. Your doctor may order blood tests or other tests to see how you are doing. °Women should inform their doctor if they wish to become pregnant or think they might be pregnant. There is a potential for serious side effects to an unborn child. Talk to your health care professional or pharmacist for more information. °You should make sure that you get enough calcium and vitamin D while you are taking this medicine. Discuss the foods you eat and the vitamins you take with your health care professional. °Some people who take this medicine have severe bone, joint, and/or muscle pain. This medicine may also increase your risk for jaw problems or a broken thigh bone. Tell your doctor right away if you have severe pain in your jaw, bones, joints, or muscles. Tell your doctor if you have any pain that does not go away or that gets worse. °Tell your dentist and dental surgeon that you are taking this medicine. You should not have major dental surgery while on this medicine. See your dentist to have a dental exam and fix any dental problems before starting this medicine. Take good care   of your teeth while on this medicine. Make sure you see your dentist for regular follow-up appointments. °What side effects may I notice from receiving this medicine? °Side effects that you should report to your doctor or health care professional as soon as possible: °-allergic  reactions like skin rash, itching or hives, swelling of the face, lips, or tongue °-anxiety, confusion, or depression °-breathing problems °-changes in vision °-eye pain °-feeling faint or lightheaded, falls °-jaw pain, especially after dental work °-mouth sores °-muscle cramps, stiffness, or weakness °-trouble passing urine or change in the amount of urine °Side effects that usually do not require medical attention (report to your doctor or health care professional if they continue or are bothersome): °-bone, joint, or muscle pain °-constipation °-diarrhea °-fever °-hair loss °-irritation at site where injected °-loss of appetite °-nausea, vomiting °-stomach upset °-trouble sleeping °-trouble swallowing °-weak or tired °This list may not describe all possible side effects. Call your doctor for medical advice about side effects. You may report side effects to FDA at 1-800-FDA-1088. °Where should I keep my medicine? °This drug is given in a hospital or clinic and will not be stored at home. °NOTE: This sheet is a summary. It may not cover all possible information. If you have questions about this medicine, talk to your doctor, pharmacist, or health care provider. °© 2014, [employer]. (2012-07-13 10:03:48) ° °

## 2013-06-10 NOTE — Progress Notes (Signed)
No signs of adverse reaction noted after Reclast infusion. Instructed to call primary care doctor regarding low blood pressures. Patient denies dizziness and lightheadedness, but stated her BP "usually runs higher." Stated her medications were changed recently by primary care doctor. Instructed to call primary care doctor regarding BP. Instructed to call Dr. Eddie Dibbles for problems and concerns regarding Reclast infusion once discharged from Short Stay. Verbalized understanding.

## 2013-06-11 ENCOUNTER — Ambulatory Visit: Payer: Medicare Other | Admitting: Family Medicine

## 2013-07-12 ENCOUNTER — Ambulatory Visit: Payer: Medicare Other | Admitting: Family Medicine

## 2013-07-26 ENCOUNTER — Encounter: Payer: Self-pay | Admitting: Family Medicine

## 2013-07-26 ENCOUNTER — Ambulatory Visit (INDEPENDENT_AMBULATORY_CARE_PROVIDER_SITE_OTHER): Payer: Medicaid Other | Admitting: Family Medicine

## 2013-07-26 VITALS — Ht 60.25 in | Wt 160.0 lb

## 2013-07-26 DIAGNOSIS — E119 Type 2 diabetes mellitus without complications: Secondary | ICD-10-CM

## 2013-07-26 DIAGNOSIS — F509 Eating disorder, unspecified: Secondary | ICD-10-CM

## 2013-07-26 NOTE — Patient Instructions (Addendum)
-   Ask Dr. Eddie Dibbles if walking (or any exercise) will cause further damage, i.e., should you avoid exercise right now, especially if it hurts? - Food goals:  1. Eat at least 3 meals and 1-2 snacks per day.  Aim for no more than 5 hours between eating.      Simple meals:  Veggie burgers or tuna fish or scrambled eggs w/ microwaved frozen veg's         Sandwich (pb or tuna or cheese) with carrots (or other veg) and fruit      Snacks:  Fruit, yogurt, trail mix, pb on crackers/celery, protein bar  2. Vegetables with both lunch and dinner.    3. Food records optional.    - Grocery store on the way home!

## 2013-07-26 NOTE — Progress Notes (Signed)
Medical Nutrition Therapy:  Appt start time: 1000 end time:  1100.  Assessment:  Primary concerns today: Elevated blood glucose (pre-DM) levels and eating disorder. 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 She is scheduled with Dr. Jacelyn Grip for an epidural on July 14.  Becky [name] said she doesn't know why she has been unable to make good food choices for herself, although being in pain has probably contributed.  In addn, her father has been drinking excessively and spending money he cannot afford to spend, which fuels [name]'s depression and anxiety.    24-hr recall suggests intake of <500 kcal:  (Up at 7 AM) B (8:30 AM)-  1 pkt inst apple cinnamon oatmeal, iced tea Snk ( AM)-   L ( PM)-  Powerade Zero, water Snk ( PM)-   D (6 PM)-  3/4 c mac&chs, 1/2 c roasted veg's, 2 c sweet tea Snk ( PM)-  G2 drink, iced tea  Progress:  In Progress.    Nutritional Diagnosis:  No progress on NB-1.5 Disordered eating pattern as related to weight anxiety as evidenced by continued frequency of skipped meals.      Intervention:  Nutrition counseling.     Monitoring/Evaluation:  Dietary intake, body weight, and physical activity in 4 weeks.

## 2013-08-23 ENCOUNTER — Ambulatory Visit: Payer: Medicare Other | Admitting: Family Medicine

## 2013-08-23 ENCOUNTER — Encounter: Payer: Self-pay | Admitting: Family Medicine

## 2013-08-23 ENCOUNTER — Ambulatory Visit (INDEPENDENT_AMBULATORY_CARE_PROVIDER_SITE_OTHER): Payer: Medicare Other | Admitting: Family Medicine

## 2013-08-23 VITALS — Ht 60.25 in | Wt 159.9 lb

## 2013-08-23 DIAGNOSIS — F509 Eating disorder, unspecified: Secondary | ICD-10-CM

## 2013-08-23 NOTE — Progress Notes (Signed)
Medical Nutrition Therapy:  Appt start time: 1100 end time:  1130.  Assessment:  Primary concerns today: Elevated blood glucose (pre-DM) levels and eating disorder. Becky [name]'s first Spencer was euthanized on Friday, which is obviously very distressing to [place].  Becky [name]'s eating has been "not so good" partly b/c of stressors in her life right now.  She has eaten  three meals a day only about 3-4 X wk in the past month.  Becky [name] cannot explain why she just is not interested in eating.    24-hr recall suggests intake of <150 kcal:  (Up at 7 AM) B (8 AM)-  1 apple, water, diet iced tea Snk ( AM)-   L ( PM)-  none Snk ( PM)-   D ( PM)-  1 apple Snk ( PM)-    Progress:  In Progress.    Nutritional Diagnosis:  No progress on NB-1.5 Disordered eating pattern as related to weight anxiety as evidenced by continued frequency of skipped meals.      Intervention:  Nutrition counseling.     Monitoring/Evaluation:  Dietary intake, body weight, and physical activity in 4 weeks.

## 2013-08-23 NOTE — Patient Instructions (Signed)
-   Food goal:  Eat at least 3 meals and 1 snack per day.    - MEAL defined:  Protein source, starch source, and a veg and/or fruit.   - Accountability:  Furniture conservator/restorer EACH day:    1. # of times you ate today AND how many of those eating times were REAL MEALS?  2. Plan for tomorrow's meals/snack(s).  - Try to arrange some meals with other people a few times a week.   - EAT whenever you feed [name] (breakfast and dinner).   - If you do walk, make sure you eat both before and after.    - YOU NEED TO EAT TO FEED YOUR BRAIN.    - And the longer you restrict, the more muscle wasting you will experience (lowering your metabolism and makes you more fatigued and less able to be physically active), and the more fat you are at risk of gaining.     - Please schedule follow-up appts:  Aug 10 at 28 and Sept 7 at 85.

## 2013-09-01 ENCOUNTER — Telehealth: Payer: Self-pay | Admitting: Critical Care Medicine

## 2013-09-01 NOTE — Telephone Encounter (Signed)
re'cd records from [facility] Forwarding 5pgs to Dr.Wright,Patrick

## 2013-09-19 ENCOUNTER — Other Ambulatory Visit: Payer: Self-pay | Admitting: Critical Care Medicine

## 2013-09-20 ENCOUNTER — Encounter: Payer: Self-pay | Admitting: Family Medicine

## 2013-09-20 ENCOUNTER — Ambulatory Visit (INDEPENDENT_AMBULATORY_CARE_PROVIDER_SITE_OTHER): Payer: Medicare Other | Admitting: Family Medicine

## 2013-09-20 VITALS — Ht 60.25 in | Wt 160.7 lb

## 2013-09-20 DIAGNOSIS — E119 Type 2 diabetes mellitus without complications: Secondary | ICD-10-CM

## 2013-09-20 DIAGNOSIS — F509 Eating disorder, unspecified: Secondary | ICD-10-CM

## 2013-09-20 NOTE — Progress Notes (Signed)
Medical Nutrition Therapy:  Appt start time: 1100 end time:  1130.  Assessment:  Primary concerns today: Elevated blood glucose (pre-DM) levels and eating disorder. 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  She has been 3 meals a day on most of the week.  There were days when she did not remember to eat meals till hours after the usual meal time.  At breakfast Becky [name] eats when she feeds her dog.  She has also scheduled a few meals with friends/family, but usually only a couple weekly.   Becky [name] walked for ~10 min 3 X wk for the past two weeks.  She still feels very fatigued daily, which is one reason she does not walk more.  She has been experiencing abdominal pain after eating; has an appt scheduled with Dr. Amedeo Plenty to address (maybe time for Botox injxn?).    24-hr recall:  (Up at 7 AM) B (7:30 AM)-  1 pkt banana oatmeal, 6 oz [place] yogurt, 1/4 c vanilla soymilk Snk ( AM)-  water L (3:30 PM)-  1 peanut butter&banana sandwich, water Snk (5 PM)-  1 apple D (9 PM)-  Tuna sandwich, 1(+) c broccoli Snk ( PM)-    Progress:  In Progress.    Nutritional Diagnosis:  No progress on NB-1.5 Disordered eating pattern as related to weight anxiety as evidenced by continued frequency of skipped meals.      Intervention:  Nutrition counseling.     Monitoring/Evaluation:  Dietary intake, body weight, and physical activity in 4 weeks.

## 2013-09-20 NOTE — Patient Instructions (Addendum)
-   When you eat at your parents' or at your sister's, BRING A CAN OF BEANS OR A BOCA BURGER.   - Vegetables that go with a sandwich:  - Tomatoes, cucumbers, carrots, bell pepper strips, raw squash or zucchini, coleslaw, salad, veg soup (if using canned, add frozen veg's).   - PLAN AHEAD; keep these foods on hand.    - Food goals:  - Eat at least 3 meals and 1-2 snacks per day.  Aim for no more than 5 hours between eating.   - Vegetables at both lunch and dinner.

## 2013-09-27 ENCOUNTER — Other Ambulatory Visit: Payer: Self-pay | Admitting: *Deleted

## 2013-09-27 MED ORDER — MOMETASONE FURO-FORMOTEROL FUM 100-5 MCG/ACT IN AERO: 2.0000 | INHALATION_SPRAY | Freq: Two times a day (BID) | RESPIRATORY_TRACT | Status: DC

## 2013-09-30 ENCOUNTER — Encounter (HOSPITAL_COMMUNITY)
Admission: RE | Disposition: A | Discharge status: Home / Self Care | Payer: Self-pay | Source: Ambulatory Visit | Attending: Gastroenterology

## 2013-09-30 ENCOUNTER — Other Ambulatory Visit: Payer: Self-pay | Admitting: Gastroenterology

## 2013-09-30 ENCOUNTER — Encounter (HOSPITAL_COMMUNITY): Payer: Self-pay

## 2013-09-30 ENCOUNTER — Ambulatory Visit (HOSPITAL_COMMUNITY)
Admission: RE | Admit: 2013-09-30 | Discharge: 2013-09-30 | Disposition: A | Discharge status: Home / Self Care | Payer: Medicare Other | Source: Ambulatory Visit | Attending: Gastroenterology | Admitting: Gastroenterology

## 2013-09-30 DIAGNOSIS — Z9089 Acquired absence of other organs: Secondary | ICD-10-CM | POA: Insufficient documentation

## 2013-09-30 DIAGNOSIS — Z87891 Personal history of nicotine dependence: Secondary | ICD-10-CM | POA: Diagnosis not present

## 2013-09-30 DIAGNOSIS — IMO0002 Reserved for concepts with insufficient information to code with codable children: Secondary | ICD-10-CM

## 2013-09-30 DIAGNOSIS — G894 Chronic pain syndrome: Secondary | ICD-10-CM | POA: Diagnosis not present

## 2013-09-30 DIAGNOSIS — E119 Type 2 diabetes mellitus without complications: Secondary | ICD-10-CM | POA: Diagnosis not present

## 2013-09-30 DIAGNOSIS — K2970 Gastritis, unspecified, without bleeding: Secondary | ICD-10-CM | POA: Insufficient documentation

## 2013-09-30 DIAGNOSIS — M1710 Unilateral primary osteoarthritis, unspecified knee: Secondary | ICD-10-CM | POA: Insufficient documentation

## 2013-09-30 DIAGNOSIS — K2990 Gastroduodenitis, unspecified, without bleeding: Secondary | ICD-10-CM | POA: Diagnosis not present

## 2013-09-30 DIAGNOSIS — T182XXA Foreign body in stomach, initial encounter: Secondary | ICD-10-CM | POA: Insufficient documentation

## 2013-09-30 DIAGNOSIS — Z888 Allergy status to other drugs, medicaments and biological substances status: Secondary | ICD-10-CM | POA: Diagnosis not present

## 2013-09-30 DIAGNOSIS — Z880 Allergy status to penicillin: Secondary | ICD-10-CM | POA: Diagnosis not present

## 2013-09-30 DIAGNOSIS — J45909 Unspecified asthma, uncomplicated: Secondary | ICD-10-CM | POA: Diagnosis not present

## 2013-09-30 DIAGNOSIS — Z79899 Other long term (current) drug therapy: Secondary | ICD-10-CM | POA: Insufficient documentation

## 2013-09-30 DIAGNOSIS — X58XXXA Exposure to other specified factors, initial encounter: Secondary | ICD-10-CM | POA: Insufficient documentation

## 2013-09-30 DIAGNOSIS — K3184 Gastroparesis: Secondary | ICD-10-CM | POA: Insufficient documentation

## 2013-09-30 DIAGNOSIS — K219 Gastro-esophageal reflux disease without esophagitis: Secondary | ICD-10-CM | POA: Insufficient documentation

## 2013-09-30 DIAGNOSIS — Z931 Gastrostomy status: Secondary | ICD-10-CM | POA: Diagnosis not present

## 2013-09-30 DIAGNOSIS — Z885 Allergy status to narcotic agent status: Secondary | ICD-10-CM | POA: Insufficient documentation

## 2013-09-30 DIAGNOSIS — F509 Eating disorder, unspecified: Secondary | ICD-10-CM

## 2013-09-30 HISTORY — PX: BOTOX INJECTION: SHX5754

## 2013-09-30 HISTORY — PX: ESOPHAGOGASTRODUODENOSCOPY: SHX5428

## 2013-09-30 LAB — GLUCOSE, CAPILLARY: Glucose-Capillary: 108 mg/dL — ABNORMAL HIGH (ref 70–99)

## 2013-09-30 SURGERY — EGD (ESOPHAGOGASTRODUODENOSCOPY)
Anesthesia: Moderate Sedation

## 2013-09-30 MED ORDER — SODIUM CHLORIDE 0.9 % IV SOLN: INTRAVENOUS | Status: DC

## 2013-09-30 MED ORDER — FENTANYL CITRATE 0.05 MG/ML IJ SOLN
INTRAMUSCULAR | Status: DC | PRN
  Administered 2013-09-30 (×3): 25 ug via INTRAVENOUS

## 2013-09-30 MED ORDER — ONABOTULINUMTOXINA 100 UNITS IJ SOLR
100.0000 [IU] | Freq: Once | INTRAMUSCULAR | Status: DC
  Filled 2013-09-30: qty 100

## 2013-09-30 MED ORDER — BUTAMBEN-TETRACAINE-BENZOCAINE 2-2-14 % EX AERO
INHALATION_SPRAY | CUTANEOUS | Status: DC | PRN
  Administered 2013-09-30: 2 via TOPICAL

## 2013-09-30 MED ORDER — DIPHENHYDRAMINE HCL 50 MG/ML IJ SOLN
INTRAMUSCULAR | Status: AC
  Filled 2013-09-30: qty 1

## 2013-09-30 MED ORDER — SODIUM CHLORIDE 0.9 % IJ SOLN: 100.0000 [IU] | Freq: Once | INTRAMUSCULAR | Status: DC

## 2013-09-30 MED ORDER — MIDAZOLAM HCL 10 MG/2ML IJ SOLN
INTRAMUSCULAR | Status: DC | PRN
  Administered 2013-09-30 (×3): 2 mg via INTRAVENOUS

## 2013-09-30 MED ORDER — FENTANYL CITRATE 0.05 MG/ML IJ SOLN
INTRAMUSCULAR | Status: AC
  Filled 2013-09-30: qty 2

## 2013-09-30 MED ORDER — SODIUM CHLORIDE 0.9 % IJ SOLN
INTRAMUSCULAR | Status: DC | PRN
  Administered 2013-09-30: 12:00:00 via SUBMUCOSAL

## 2013-09-30 MED ORDER — MIDAZOLAM HCL 10 MG/2ML IJ SOLN
INTRAMUSCULAR | Status: AC
  Filled 2013-09-30: qty 2

## 2013-09-30 NOTE — Addendum Note (Signed)
Addended by: Teena Irani on: 09/30/2013 09:06 AM   Modules accepted: Orders

## 2013-09-30 NOTE — H&P (Signed)
[facility] Admission History & Physical  Chief Complaint: Nausea vomiting early satiety. HPI: Becky [name] is an 58 y.o. white female.  Idiopathic gastroparesis who has benefited from pyloric Botox injections in the past in terms of relief of her presenting symptoms when medical therapy has failed. Diffusely last about a year. Her last one was done in October 2014 and gave good relief until 2 or 3 months ago. She presents here for repeat EGD with Botox injection of the pyloric sphincter  Past Medical History  Diagnosis Date  . Asthma   . GERD (gastroesophageal reflux disease)   . Chronic pain syndrome   . Migraine   . Gastroparesis     botox injections  . Anorexia nervosa   . Diabetes mellitus   . Multiple allergies   . Migraine   . Personality change due to conditions classified elsewhere   . Hyperglycemia   . Painful respiration   . Raynaud's syndrome   . Encounter for long-term (current) use of other medications   . Ventral hernia   . Vitamin D deficiency   . Tremor   . Arthritis     bilateral knees  . Murmur, cardiac     Past Surgical History  Procedure Laterality Date  . Nissen fundoplication  1937    Kaylyn Lim  . Cholecystectomy    . Peg tube placement      removed 1996  . Tonsillectomy and adenoidectomy    . Hernia repair    . Esophagogastroduodenoscopy  08/20/2011    Procedure: ESOPHAGOGASTRODUODENOSCOPY (EGD);  Surgeon: Missy Sabins, MD;  Location: [facility] ENDOSCOPY;  Service: Endoscopy;  Laterality: N/A;  . Esophagogastroduodenoscopy N/A 11/18/2012    Procedure: ESOPHAGOGASTRODUODENOSCOPY (EGD);  Surgeon: Missy Sabins, MD;  Location: [facility] ENDOSCOPY;  Service: Endoscopy;  Laterality: N/A;  . Botox injection N/A 11/18/2012    Procedure: BOTOX INJECTION;  Surgeon: Missy Sabins, MD;  Location: [facility] ENDOSCOPY;  Service: Endoscopy;  Laterality: N/A;    Medications Prior to Admission  Medication Sig Dispense Refill  . albuterol (PROAIR HFA) 108 (90  BASE) MCG/ACT inhaler INHALE 2 PUFFS INTO THE LUNGS EVERY 6 HOURS AS NEEDED  1 Inhaler  6  . benztropine (COGENTIN) 0.5 MG tablet Take 1 tablet (0.5 mg total) by mouth daily.  30 tablet  12  . buPROPion (WELLBUTRIN XL) 150 MG 24 hr tablet Take 3 tablets by mouth daily to = 450mg       . clonazePAM (KLONOPIN) 0.5 MG tablet Take 0.5 mg by mouth 4 (four) times daily.       . DULoxetine (CYMBALTA) 30 MG capsule Take 30 mg by mouth daily.       . DULoxetine (CYMBALTA) 60 MG capsule Take 120 mg by mouth daily. Taken at lunch time.      . eszopiclone (LUNESTA) 2 MG TABS Take 2 mg by mouth at bedtime. Take immediately before bedtime       . fluticasone (FLONASE) 50 MCG/ACT nasal spray Place 2 sprays into both nostrils daily.  16 g  11  . gabapentin (NEURONTIN) 100 MG capsule Take 100 mg by mouth 2 (two) times daily.       Marland Kitchen HYDROCHLOROTHIAZIDE PO Take by mouth.      Marland Kitchen HYDROcodone-acetaminophen (NORCO/VICODIN) 5-325 MG per tablet Take 1 tablet by mouth every 6 (six) hours as needed for moderate pain.      Marland Kitchen ibuprofen (ADVIL,MOTRIN) 600 MG tablet Take 600 mg by mouth 2 (two) times daily as  needed. 08/20/11 per pt, taking in morning and at night per MD instructions      . losartan (COZAAR) 50 MG tablet Take 50 mg by mouth daily.      . meclizine (ANTIVERT) 25 MG tablet Take 1 tablet by mouth as needed.      . metFORMIN (GLUCOPHAGE) 500 MG tablet Take 500 mg by mouth daily after breakfast.      . metoprolol tartrate (LOPRESSOR) 25 MG tablet Take 25 mg by mouth 2 (two) times daily.      . mometasone-formoterol (DULERA) 100-5 MCG/ACT AERO Inhale 2 puffs into the lungs 2 (two) times daily.  3 Inhaler  1  . montelukast (SINGULAIR) 10 MG tablet Take 1 tablet by mouth Daily.      Marland Kitchen omeprazole (PRILOSEC) 20 MG capsule TAKE 1 CAPSULE BY MOUTH TWICE DAILY  180 capsule  1  . promethazine (PHENERGAN) 25 MG tablet Take 25 mg by mouth every 6 (six) hours as needed.      . traMADol (ULTRAM) 50 MG tablet Take 1-2 two times a  day as needed for knee pain  60 tablet  3  . traZODone (DESYREL) 50 MG tablet Take 50 mg by mouth at bedtime.       . ziprasidone (GEODON) 20 MG capsule Take 20 mg by mouth every morning.      . ziprasidone (GEODON) 40 MG capsule Take by mouth. Taking 40 at dinner      . ziprasidone (GEODON) 80 MG capsule Take 80 mg by mouth at bedtime. Taking 80 mg at bedtime      . albuterol (PROVENTIL) (2.5 MG/3ML) 0.083% nebulizer solution Take 3 mLs (2.5 mg total) by nebulization every 6 (six) hours as needed. DX:  493.00 FILE WITH MCR PART B  300 mL  5  . cetirizine (ZYRTEC) 10 MG tablet Take 10 mg by mouth every morning.        . lidocaine (LIDODERM) 5 % Place 1 patch onto the skin daily as needed. Remove & Discard patch within 12 hours or as directed by MD        Allergies:  Allergies  Allergen Reactions  . Methadone Shortness Of Breath and Other (See Comments)    Chest Pain  . Nitrofurantoin Shortness Of Breath and Other (See Comments)    Chest Pain  . Percocet [Oxycodone-Acetaminophen] Shortness Of Breath and Other (See Comments)    Chest pain  . Codeine Nausea And Vomiting  . Ms Contin Cleone Slim Sulfate] Nausea And Vomiting  . Penicillins Nausea And Vomiting, Rash and Other (See Comments)    Fever, including amoxicillin   . Amoxicillin Rash  . Aspirin Other (See Comments)    Reaction: stomach pain  . Clarithromycin Rash and Other (See Comments)    Fever    Family History  Problem Relation Age of Onset  . Emphysema Paternal Grandfather     was a smoker  . Diabetes Father   . High blood pressure Father   . Cancer - Prostate Father   . Urinary tract infection Mother   . Sleep disorder Mother     Social History:  reports that she quit smoking about 20 years ago. Her smoking use included Cigarettes. She has a 1 pack-year smoking history. She has never used smokeless tobacco. She reports that she does not drink alcohol or use illicit drugs.  Review of Systems: negative except as  above   Blood pressure 110/62, pulse 57, temperature 98.1 F (36.7 C), temperature source  Oral, resp. rate 16, SpO2 95.00%. Head: Normocephalic, without obvious abnormality, atraumatic Neck: no adenopathy, no carotid bruit, no JVD, supple, symmetrical, trachea midline and thyroid not enlarged, symmetric, no tenderness/mass/nodules Resp: clear to auscultation bilaterally Cardio: regular rate and rhythm, S1, S2 normal, no murmur, click, rub or gallop GI: Abdomen soft nondistended with normoactive bowel sounds. No hepatomegaly masses or guarding Extremities: extremities normal, atraumatic, no cyanosis or edema  Results for orders placed during the hospital encounter of 09/30/13 (from the past 48 hour(s))  GLUCOSE, CAPILLARY     Status: Abnormal   Collection Time    09/30/13 11:42 AM      Result Value Ref Range   Glucose-Capillary 108 (*) 70 - 99 mg/dL   No results found.  Assessment: Gastroparesis, with good response to Botox injection the pylorus in the past Plan: Proceed with EGD and Botox injection.  Ladarren Steiner C 09/30/2013, 11:54 AM

## 2013-09-30 NOTE — Op Note (Signed)
[facility] [place], [address]   ENDOSCOPY PROCEDURE REPORT  PATIENT: Becky, [name]  MR#: [id number] BIRTHDATE: May 18, 1955 , 45  yrs. old GENDER: Female ENDOSCOPIST:Lenyx Boody Amedeo Plenty, MD REFERRED BY: PROCEDURE DATE:  09/30/2013 PROCEDURE: ASA CLASS: INDICATIONS:  idiopathic gastroparesis MEDICATION:    fentanyl 75 mcg, Versed 6 mg TOPICAL ANESTHETIC:    Cetacaine spray  DESCRIPTION OF PROCEDURE:   esophagus: Normal Stomach: Mild distal gastritis, moderate amount of retained bezoar in the stomach. Pylorus appeared patent 25 units of botulinum toxin was injected in each of 4 quadrants intramuscularly with success. Duodenum: Normal     COMPLICATIONS: None  ENDOSCOPIC IMPRESSION:retained food in the stomach, gastritis, known gastroparesis, status post Botox injections.  [place] home and observe response to therapy. Followup procedure when necessary.    _______________________________ Lorrin MaisTeena Irani, MD 09/30/2013 12:13 PM

## 2013-10-01 ENCOUNTER — Encounter (HOSPITAL_COMMUNITY): Payer: Self-pay | Admitting: Gastroenterology

## 2013-10-06 ENCOUNTER — Ambulatory Visit (INDEPENDENT_AMBULATORY_CARE_PROVIDER_SITE_OTHER)
Admission: RE | Admit: 2013-10-06 | Discharge: 2013-10-06 | Disposition: A | Discharge status: Home / Self Care | Payer: Medicare Other | Source: Ambulatory Visit | Attending: Adult Health | Admitting: Adult Health

## 2013-10-06 ENCOUNTER — Encounter: Payer: Self-pay | Admitting: Adult Health

## 2013-10-06 ENCOUNTER — Ambulatory Visit (INDEPENDENT_AMBULATORY_CARE_PROVIDER_SITE_OTHER): Payer: Medicare Other | Admitting: Adult Health

## 2013-10-06 VITALS — BP 112/62 | HR 62 | Temp 98.2°F | Ht 60.5 in | Wt 164.5 lb

## 2013-10-06 DIAGNOSIS — J45909 Unspecified asthma, uncomplicated: Secondary | ICD-10-CM

## 2013-10-06 DIAGNOSIS — R059 Cough, unspecified: Secondary | ICD-10-CM

## 2013-10-06 DIAGNOSIS — R05 Cough: Secondary | ICD-10-CM

## 2013-10-06 MED ORDER — PREDNISONE 10 MG PO TABS: ORAL_TABLET | ORAL | Status: DC

## 2013-10-06 MED ORDER — HYDROCODONE-HOMATROPINE 5-1.5 MG/5ML PO SYRP: 5.0000 mL | ORAL_SOLUTION | Freq: Four times a day (QID) | ORAL | Status: DC | PRN

## 2013-10-06 NOTE — Progress Notes (Signed)
   Subjective:    Patient ID: Becky [name], female    DOB: [birth year], 58 y.o.   MRN: [id number]  HPI 59 yo female with known hx of Asthma   10/06/2013 Acute  Complains of persistent dry cough x6 months that causes sore throat, hoarseness, gagging, occasional phlegm in throat that is clear or yellow, dyspnea, head congestion w/ PND.  Congestion is mainly clear. Complains of coughing so hard she could vomit.  Denies any wheezing, tightness, hemoptysis Using water and mint candy for cough  On Dulera Twice daily   Has seen PCP , was not given anything per pt. Has seen allergist , changed to allegra and flonase.  Had EGD on 09/2013 w/ noted gastritis/known gastroparesis s/p botox injections , cont on prilosec  Cough comes and goes. Does not take any cough meds.  She denies any fever, chest pain, orthopnea, PND, leg swelling, hemoptysis, or weight loss.     Review of Systems Constitutional:   No  weight loss, night sweats,  Fevers, chills, fatigue, or  lassitude.  HEENT:   No headaches,  Tooth/dental problems, or  Sore throat,                No sneezing, itching, ear ache, + nasal congestion, post nasal drip,   CV:  No chest pain,  Orthopnea, PND, swelling in lower extremities, anasarca, dizziness, palpitations, syncope.   GI  No   abdominal pain, nausea, vomiting, diarrhea, change in bowel habits, loss of appetite, bloody stools.   Resp:  No chest wall deformity  Skin: no rash or lesions.  GU: no dysuria, change in color of urine, no urgency or frequency.  No flank pain, no hematuria   MS:  No joint pain or swelling.  No decreased range of motion.  No back pain.  Psych:  No change in mood or affect. No depression or anxiety.  No memory loss.         Objective:   Physical Exam GEN: A/Ox3; pleasant , NAD  HEENT:  [place]/AT,  EACs-clear, TMs-wnl, NOSE-clear, THROAT-clear, no lesions, no postnasal drip or exudate noted.   NECK:  Supple w/ fair ROM; no JVD; normal  carotid impulses w/o bruits; no thyromegaly or nodules palpated; no lymphadenopathy.  RESP  Decreased BS in bases, no stridor , barking cough  no accessory muscle use, no dullness to percussion   CARD:  RRR, no m/r/g  , no peripheral edema, pulses intact, no cyanosis or clubbing.  GI:   Soft & nt; nml bowel sounds; no organomegaly or masses detected.  Musco: Warm bil, no deformities or joint swelling noted.   Neuro: alert, no focal deficits noted.    Skin: Warm, no lesions or rashes         Assessment & Plan:

## 2013-10-06 NOTE — Patient Instructions (Signed)
Prednisone taper over next week.  Delsym 2 tsp Twice daily  For cough.  Hydromet 1 tsp every 6hr As needed  Cough, may make you sleepy.  Add Chlortrimeton 4mg  2 At bedtime  For drainage .  Continue on Allegra in am .  Continue on Prilosec 20mg  Twice daily   GERD diet  Sips of water and sugarless candy to help with cough and throat clearing , NO MINTS Please contact office for sooner follow up if symptoms do not improve or worsen or seek emergency care  Follow up Dr. Joya Gaskins  In 6 weeks and As needed

## 2013-10-07 NOTE — Assessment & Plan Note (Addendum)
Flare with Upper airway cough w/ triggers of GERD/Drainage/AR Check cxr   Plan  Prednisone taper over next week.  Delsym 2 tsp Twice daily  For cough.  Hydromet 1 tsp every 6hr As needed  Cough, may make you sleepy.  Add Chlortrimeton 4mg  2 At bedtime  For drainage .  Continue on Allegra in am .  Continue on Prilosec 20mg  Twice daily   GERD diet  Sips of water and sugarless candy to help with cough and throat clearing , NO MINTS Please contact office for sooner follow up if symptoms do not improve or worsen or seek emergency care  Follow up Dr. Joya Gaskins  In 6 weeks and As needed

## 2013-10-12 NOTE — Progress Notes (Signed)
Quick Note:  Called spoke with patient, advised of cxr results / recs as stated by TP. Pt verbalized her understanding and denied any questions. ______ 

## 2013-10-13 ENCOUNTER — Other Ambulatory Visit: Payer: Self-pay | Admitting: Neurosurgery

## 2013-10-13 DIAGNOSIS — M5416 Radiculopathy, lumbar region: Secondary | ICD-10-CM

## 2013-10-20 ENCOUNTER — Ambulatory Visit
Admission: RE | Admit: 2013-10-20 | Discharge: 2013-10-20 | Disposition: A | Discharge status: Home / Self Care | Payer: Medicare Other | Source: Ambulatory Visit | Attending: Neurosurgery | Admitting: Neurosurgery

## 2013-10-20 VITALS — BP 111/74 | HR 51

## 2013-10-20 DIAGNOSIS — M549 Dorsalgia, unspecified: Secondary | ICD-10-CM

## 2013-10-20 DIAGNOSIS — M5416 Radiculopathy, lumbar region: Secondary | ICD-10-CM

## 2013-10-20 MED ORDER — IOHEXOL 180 MG/ML  SOLN
17.0000 mL | Freq: Once | INTRAMUSCULAR | Status: AC | PRN
  Administered 2013-10-20: 17 mL via INTRATHECAL

## 2013-10-20 MED ORDER — DIAZEPAM 5 MG PO TABS
10.0000 mg | ORAL_TABLET | Freq: Once | ORAL | Status: AC
  Administered 2013-10-20: 10 mg via ORAL

## 2013-10-20 NOTE — Progress Notes (Signed)
Patient states she has been off Cymbalta, Phenergan, Tramadol, Trazodone and Wellbutrin for at least the past two days.  jkl

## 2013-10-20 NOTE — Discharge Instructions (Signed)
Myelogram Discharge Instructions  1. Go home and rest quietly for the next 24 hours.  It is important to lie flat for the next 24 hours.  Get up only to go to the restroom.  You may lie in the bed or on a couch on your back, your stomach, your left side or your right side.  You may have one pillow under your head.  You may have pillows between your knees while you are on your side or under your knees while you are on your back.  2. DO NOT drive today.  Recline the seat as far back as it will go, while still wearing your seat belt, on the way home.  3. You may get up to go to the bathroom as needed.  You may sit up for 10 minutes to eat.  You may resume your normal diet and medications unless otherwise indicated.  Drink plenty of extra fluids today and tomorrow.  4. The incidence of a spinal headache with nausea and/or vomiting is about 5% (one in 20 patients).  If you develop a headache, lie flat and drink plenty of fluids until the headache goes away.  Caffeinated beverages may be helpful.  If you develop severe nausea and vomiting or a headache that does not go away with flat bed rest, call [phone].  5. You may resume normal activities after your 24 hours of bed rest is over; however, do not exert yourself strongly or do any heavy lifting tomorrow.  6. Call your physician for a follow-up appointment.   You may resume Cymbalta, Promethazine/Phenergan, Tramadol, Trazodone and Wellbutrin on Thursday, October 21, 2013 after 11:00a.m.

## 2013-10-25 ENCOUNTER — Encounter: Payer: Self-pay | Admitting: Family Medicine

## 2013-10-25 ENCOUNTER — Ambulatory Visit (INDEPENDENT_AMBULATORY_CARE_PROVIDER_SITE_OTHER): Payer: Medicare Other | Admitting: Family Medicine

## 2013-10-25 VITALS — Ht 60.25 in | Wt 157.7 lb

## 2013-10-25 DIAGNOSIS — F509 Eating disorder, unspecified: Secondary | ICD-10-CM

## 2013-10-25 DIAGNOSIS — E119 Type 2 diabetes mellitus without complications: Secondary | ICD-10-CM

## 2013-10-25 NOTE — Patient Instructions (Signed)
-   Great job with eating more consistently and getting vegetables! - Food goals remain the same:  1. Eat at least 3 meals and 1-2 snacks per day.  Aim for no more than 5 hours between eating.  Eat breakfast within one hour of getting up.    - Emphasis on no more than 5 hrs between eating!  2. Vegetables twice a day.     - Remind your mom as needed to keep veg's (and protein sources) on hand as well.   3. Advance planning to make sure you have the foods you need to follow through with above goals.    - Let me know your test results.    - Next appts:  Oct 26 at 11 and Nov  23 at 10:30 AM.

## 2013-10-25 NOTE — Progress Notes (Signed)
Medical Nutrition Therapy:  Appt start time: 1100 end time:  1130.  Assessment:  Primary concerns today: Elevated blood glucose (pre-DM) levels and eating disorder. Lajoya got a myelogram done last week for the right side pain (back, hip, leg), but won't get results until this afternoon when she sees Dr Leeroy Cha.  Gudelia still feels a lot of fatigue, but she has been eating more regularly, 2-3 meals a day, with veg's twice daily on most days.  Glynnis has walked ~60 min (3.2 mi) 1 X wk and walks her dog 15-20 min daily.  Back, hip, and leg pain is worse after walking.  She has occasionally taken Tramedol, and rarely, Hydrocodon.   She is not currently checking BG, but has had no hypoglycemic symptoms.  Congratulated her on better consistency of meals.    24-hr recall suggests intake of ~1000 kcal:  (Up at 7 AM) B (7:30 AM)-  1 pkt flavored inst oatmeal, 5.3 oz [place] yogurt, 6 oz soy milk 350 Snk ( AM)-   L (2 PM)-  1 chs (2 oz) high-pro sandwich, carrots, water   390 Snk (4 PM)-  1 apple           60 D (7 PM)-  3 oz tuna fish, mustard, 1 c steamed broccoli, diet swt tea  200 Snk ( PM)-   Typical day? Yes.    Progress:  In Progress.    Nutritional Diagnosis:  Slight progress noted on NB-1.5 Disordered eating pattern as related to weight anxiety as evidenced by self-report of 2-3 meals daily, with 3 meals on most days.      Intervention:  Nutrition counseling.     Monitoring/Evaluation:  Dietary intake, body weight, and physical activity in 6 weeks.

## 2013-10-28 ENCOUNTER — Other Ambulatory Visit: Payer: Self-pay | Admitting: Neurosurgery

## 2013-10-28 DIAGNOSIS — M545 Low back pain: Secondary | ICD-10-CM

## 2013-10-29 ENCOUNTER — Other Ambulatory Visit: Payer: Self-pay | Admitting: Neurosurgery

## 2013-10-29 ENCOUNTER — Ambulatory Visit
Admission: RE | Admit: 2013-10-29 | Discharge: 2013-10-29 | Disposition: A | Discharge status: Home / Self Care | Payer: Medicare Other | Source: Ambulatory Visit | Attending: Neurosurgery | Admitting: Neurosurgery

## 2013-10-29 VITALS — BP 145/71 | HR 58

## 2013-10-29 DIAGNOSIS — M545 Low back pain: Secondary | ICD-10-CM

## 2013-10-29 DIAGNOSIS — M549 Dorsalgia, unspecified: Secondary | ICD-10-CM

## 2013-10-29 MED ORDER — IOHEXOL 180 MG/ML  SOLN
1.0000 mL | Freq: Once | INTRAMUSCULAR | Status: AC | PRN
Start: 2013-10-29 — End: 2013-10-29
  Administered 2013-10-29: 1 mL via EPIDURAL

## 2013-10-29 NOTE — Discharge Instructions (Signed)

## 2013-11-11 ENCOUNTER — Other Ambulatory Visit: Payer: Self-pay | Admitting: Neurosurgery

## 2013-11-17 ENCOUNTER — Ambulatory Visit: Payer: Medicare Other | Admitting: Critical Care Medicine

## 2013-11-22 ENCOUNTER — Ambulatory Visit: Payer: Medicare Other | Admitting: Family Medicine

## 2013-11-23 ENCOUNTER — Encounter (HOSPITAL_COMMUNITY): Payer: Self-pay | Admitting: Pharmacy Technician

## 2013-11-30 ENCOUNTER — Encounter (HOSPITAL_COMMUNITY)
Admission: RE | Admit: 2013-11-30 | Discharge: 2013-11-30 | Disposition: A | Discharge status: Home / Self Care | Payer: Medicare Other | Source: Ambulatory Visit | Attending: Neurosurgery | Admitting: Neurosurgery

## 2013-11-30 ENCOUNTER — Encounter (HOSPITAL_COMMUNITY): Payer: Self-pay

## 2013-11-30 DIAGNOSIS — R001 Bradycardia, unspecified: Secondary | ICD-10-CM | POA: Insufficient documentation

## 2013-11-30 DIAGNOSIS — Z01818 Encounter for other preprocedural examination: Secondary | ICD-10-CM | POA: Insufficient documentation

## 2013-11-30 HISTORY — DX: Essential (primary) hypertension: I10

## 2013-11-30 HISTORY — DX: Polyneuropathy, unspecified: G62.9

## 2013-11-30 HISTORY — DX: Personal history of other medical treatment: Z92.89

## 2013-11-30 HISTORY — DX: Anemia, unspecified: D64.9

## 2013-11-30 HISTORY — DX: Anxiety disorder, unspecified: F41.9

## 2013-11-30 HISTORY — DX: Major depressive disorder, single episode, unspecified: F32.9

## 2013-11-30 LAB — CBC
HEMATOCRIT: 36 % (ref 36.0–46.0)
HEMOGLOBIN: 11.9 g/dL — AB (ref 12.0–15.0)
MCH: 27.8 pg (ref 26.0–34.0)
MCHC: 33.1 g/dL (ref 30.0–36.0)
MCV: 84.1 fL (ref 78.0–100.0)
Platelets: 291 10*3/uL (ref 150–400)
RBC: 4.28 MIL/uL (ref 3.87–5.11)
RDW: 15.5 % (ref 11.5–15.5)
WBC: 4.4 10*3/uL (ref 4.0–10.5)

## 2013-11-30 LAB — BASIC METABOLIC PANEL
Anion gap: 12 (ref 5–15)
BUN: 15 mg/dL (ref 6–23)
CHLORIDE: 92 meq/L — AB (ref 96–112)
CO2: 27 meq/L (ref 19–32)
CREATININE: 0.85 mg/dL (ref 0.50–1.10)
Calcium: 9.3 mg/dL (ref 8.4–10.5)
GFR calc Af Amer: 87 mL/min — ABNORMAL LOW (ref >90)
GFR calc non Af Amer: 75 mL/min — ABNORMAL LOW (ref >90)
GLUCOSE: 105 mg/dL — AB (ref 70–99)
Potassium: 4 mEq/L (ref 3.7–5.3)
Sodium: 131 mEq/L — ABNORMAL LOW (ref 137–147)

## 2013-11-30 LAB — SURGICAL PCR SCREEN
MRSA, PCR: NEGATIVE
Staphylococcus aureus: NEGATIVE

## 2013-11-30 NOTE — Pre-Procedure Instructions (Signed)
KIMARI LIENHARD  11/30/2013   Your procedure is scheduled on:  Tuesday, October 27th  Report to [facility] Admitting at 0930 AM.  Call this number if you have problems the morning of surgery: [phone]   Remember:   Do not eat food or drink liquids after midnight.   Take these medicines the morning of surgery with A SIP OF WATER: lopressor, prilosec, geodon, klonopin, cogentin, wellbutrin, cymbalta, flonase, neurontin, ultram if needed   Do not wear jewelry, make-up or nail polish.  Do not wear lotions, powders, or perfumes. You may wear deodorant.  Do not shave 48 hours prior to surgery. Men may shave face and neck.  Do not bring valuables to the hospital.  [facility] is not responsible  for any belongings or valuables.               Contacts, dentures or bridgework may not be worn into surgery.  Leave suitcase in the car. After surgery it may be brought to your room.  For patients admitted to the hospital, discharge time is determined by your treatment team.               Patients discharged the day of surgery will not be allowed to drive home.  Please read over the following fact sheets that you were given: Pain Booklet, Coughing and Deep Breathing, MRSA Information and Surgical Site Infection Prevention [facility] - Preparing for Surgery  Before surgery, you can play an important role.  Because skin is not sterile, your skin needs to be as free of germs as possible.  You can reduce the number of germs on you skin by washing with CHG (chlorahexidine gluconate) soap before surgery.  CHG is an antiseptic cleaner which kills germs and bonds with the skin to continue killing germs even after washing.  Please DO NOT use if you have an allergy to CHG or antibacterial soaps.  If your skin becomes reddened/irritated stop using the CHG and inform your nurse when you arrive at Short Stay.  Do not shave (including legs and underarms) for at least 48 hours prior to the  first CHG shower.  You may shave your face.  Please follow these instructions carefully:   1.  Shower with CHG Soap the night before surgery and the morning of Surgery.  2.  If you choose to wash your hair, wash your hair first as usual with your normal shampoo.  3.  After you shampoo, rinse your hair and body thoroughly to remove the shampoo.  4.  Use CHG as you would any other liquid soap.  You can apply CHG directly to the skin and wash gently with scrungie or a clean washcloth.  5.  Apply the CHG Soap to your body ONLY FROM THE NECK DOWN.  Do not use on open wounds or open sores.  Avoid contact with your eyes, ears, mouth and genitals (private parts).  Wash genitals (private parts) with your normal soap.  6.  Wash thoroughly, paying special attention to the area where your surgery will be performed.  7.  Thoroughly rinse your body with warm water from the neck down.  8.  DO NOT shower/wash with your normal soap after using and rinsing off the CHG Soap.  9.  Pat yourself dry with a clean towel.            10.  Wear clean pajamas.            11.  Place clean sheets on your bed the night of your first shower and do not sleep with pets.  Day of Surgery  Do not apply any lotions/deoderants the morning of surgery.  Please wear clean clothes to the hospital/surgery center.

## 2013-11-30 NOTE — Progress Notes (Signed)
Primary - dr. Idelle Crouch wilson No cardiologist No recent cardiac testing

## 2013-12-06 ENCOUNTER — Ambulatory Visit (INDEPENDENT_AMBULATORY_CARE_PROVIDER_SITE_OTHER): Payer: Medicare Other | Admitting: Family Medicine

## 2013-12-06 ENCOUNTER — Encounter: Payer: Self-pay | Admitting: Family Medicine

## 2013-12-06 VITALS — Ht 60.25 in | Wt 157.3 lb

## 2013-12-06 DIAGNOSIS — E119 Type 2 diabetes mellitus without complications: Secondary | ICD-10-CM

## 2013-12-06 MED ORDER — VANCOMYCIN HCL IN DEXTROSE 1-5 GM/200ML-% IV SOLN
1000.0000 mg | INTRAVENOUS | Status: AC
  Administered 2013-12-07: 1000 mg via INTRAVENOUS
  Filled 2013-12-06: qty 200

## 2013-12-06 NOTE — Progress Notes (Signed)
Medical Nutrition Therapy:  Appt start time: 1100 end time:  1130.  Assessment:  Primary concerns today: Elevated blood glucose (pre-DM) levels and eating disorder. April has been pretty consistent in getting 3 meals a day.  Her energy levels are still poor, however; she is not sleeping well b/c of back pain.  She goes for a lumbar laminectomy tomorrow, and will be staying at her parents for recovery.    24-hr recall:  (Up at 5 AM) B (5 AM)-  1 apple Snk (7 AM)-  1 peach instant oatmeal, 6 oz soymilk, water L (1 PM)-  1 c mixed frozen veg's, pb sandwich, diet lemonade  Snk ( PM)-  water D ( PM)-  2 c mixed frozen veg's, baked potato w/ 1 oz cheese, diet lemonade Snk ( PM)-  --- Typical day? Yes.      Progress:  In Progress.    Nutritional Diagnosis:  Slight progress noted on NB-1.5 Disordered eating pattern as related to weight anxiety as evidenced by self-report of 2-3 meals daily, with 3 meals on most days.      Intervention:  Nutrition counseling.     Monitoring/Evaluation:  Dietary intake, body weight, and physical activity in 6 weeks.

## 2013-12-06 NOTE — Patient Instructions (Addendum)
-   A couple of PTs at [facility] I know are great:  Raeford Razor and Pearson Forster (on [address]). - Ask your mom to be sure to have plenty of veg's on hand for your recovery time with them.    - Also check that she has protein sources:  BEANS, veg burgers, tuna, cheese, eggs, Greek yogurt.  - Continue your emphasis on 3 REAL meals/day, getting veg's twice a day, and protein with each meal! - As you are able to post-surgery, get back to your eating routine of 3 real meals per day.    - Good luck with tomorrow's surgery!  - Please schedule 30-min appt at front desk:   NOV 23 at 11 AM  DEC 14 at 10:30

## 2013-12-06 NOTE — H&P (Signed)
Becky [name] is an 58 y.o. female.   Chief Complaint: right leg pain HPI: patient seen with lumbar pain with radiation to the right leg for several months. She has failed with conservative treatment including epidural injections.  Past Medical History  Diagnosis Date  . GERD (gastroesophageal reflux disease)   . Chronic pain syndrome   . Migraine   . Gastroparesis     botox injections  . Anorexia nervosa   . Diabetes mellitus   . Multiple allergies   . Migraine   . Personality change due to conditions classified elsewhere   . Hyperglycemia   . Painful respiration   . Raynaud's syndrome   . Encounter for long-term (current) use of other medications   . Ventral hernia   . Vitamin D deficiency   . Tremor   . Arthritis     bilateral knees  . Hypertension   . Murmur, cardiac     seen cardiologist in past - no workup required  . Asthma     mainly problems in cold weather  . Anxiety   . Depression   . Neuropathy     right foot  . Anemia   . History of blood transfusion     Past Surgical History  Procedure Laterality Date  . Nissen fundoplication  2778    Kaylyn Lim  . Cholecystectomy    . Peg tube placement      removed 1996  . Tonsillectomy and adenoidectomy    . Hernia repair    . Esophagogastroduodenoscopy  08/20/2011    Procedure: ESOPHAGOGASTRODUODENOSCOPY (EGD);  Surgeon: Missy Sabins, MD;  Location: [facility] ENDOSCOPY;  Service: Endoscopy;  Laterality: N/A;  . Esophagogastroduodenoscopy N/A 11/18/2012    Procedure: ESOPHAGOGASTRODUODENOSCOPY (EGD);  Surgeon: Missy Sabins, MD;  Location: [facility] ENDOSCOPY;  Service: Endoscopy;  Laterality: N/A;  . Botox injection N/A 11/18/2012    Procedure: BOTOX INJECTION;  Surgeon: Missy Sabins, MD;  Location: [facility] ENDOSCOPY;  Service: Endoscopy;  Laterality: N/A;  . Esophagogastroduodenoscopy N/A 09/30/2013    Procedure: ESOPHAGOGASTRODUODENOSCOPY (EGD);  Surgeon: Missy Sabins, MD;  Location: [facility] ENDOSCOPY;  Service: Endoscopy;   Laterality: N/A;  . Botox injection N/A 09/30/2013    Procedure: BOTOX INJECTION;  Surgeon: Missy Sabins, MD;  Location: [facility] ENDOSCOPY;  Service: Endoscopy;  Laterality: N/A;    Family History  Problem Relation Age of Onset  . Emphysema Paternal Grandfather     was a smoker  . Diabetes Father   . High blood pressure Father   . Cancer - Prostate Father   . Urinary tract infection Mother   . Sleep disorder Mother    Social History:  reports that she quit smoking about 20 years ago. Her smoking use included Cigarettes. She has a 1 pack-year smoking history. She has never used smokeless tobacco. She reports that she does not drink alcohol or use illicit drugs.  Allergies:  Allergies  Allergen Reactions  . Methadone Shortness Of Breath and Other (See Comments)    Chest Pain  . Nitrofurantoin Shortness Of Breath and Other (See Comments)    Chest Pain  . Percocet [Oxycodone-Acetaminophen] Shortness Of Breath and Other (See Comments)    Chest pain  . Penicillins Nausea And Vomiting, Rash and Other (See Comments)    Fever, including amoxicillin   . Hydrocodone-Acetaminophen Itching  . Propoxyphene   . Amoxicillin Rash  . Aspirin Other (See Comments)    stomach pain  . Clarithromycin Rash and Other (See Comments)  Fever  . Codeine Nausea And Vomiting  . Ms Contin Cleone Slim Sulfate] Nausea And Vomiting    No prescriptions prior to admission    No results found for this or any previous visit (from the past 48 hour(s)). No results found.  Review of Systems  Constitutional: Negative.   HENT: Negative.   Eyes: Negative.   Respiratory: Positive for wheezing.   Cardiovascular: Negative.   Gastrointestinal: Negative.   Genitourinary: Positive for dysuria and frequency.  Musculoskeletal: Positive for back pain.  Skin: Negative.   Neurological: Positive for sensory change and focal weakness.  Endo/Heme/Allergies: Negative.   Psychiatric/Behavioral: Positive for depression. The  patient is nervous/anxious.     There were no vitals taken for this visit. Physical Exam hent, nl. Neck, nl. Cv, nl. Lungs, some ronchii bilaterally. Abdomen, soft. Extremities, grade 1 edema. NEURO WEAKNESS OF df OF THE RIGHT FOOT.  dtr  Wnl. Myelogram shows stenosis at right l5s1 secondary to hypertrophy of the facet  The left side is wnl. Assessment/Plan Patient to go ahead with right l5-s1 foraminotomy and possible discectomy. She is aware of risks and benefits  Jillayne Witte M 12/06/2013, 5:13 PM

## 2013-12-07 ENCOUNTER — Encounter (HOSPITAL_COMMUNITY): Payer: Medicare Other | Admitting: Anesthesiology

## 2013-12-07 ENCOUNTER — Inpatient Hospital Stay (HOSPITAL_COMMUNITY): Payer: Medicare Other

## 2013-12-07 ENCOUNTER — Encounter (HOSPITAL_COMMUNITY)
Admission: RE | Disposition: A | Discharge status: Home / Self Care | Payer: Self-pay | Source: Ambulatory Visit | Attending: Neurosurgery

## 2013-12-07 ENCOUNTER — Inpatient Hospital Stay (HOSPITAL_COMMUNITY)
Admission: RE | Admit: 2013-12-07 | Discharge: 2013-12-09 | DRG: 517 | Disposition: A | Discharge status: Home / Self Care | Payer: Medicare Other | Source: Ambulatory Visit | Attending: Neurosurgery | Admitting: Neurosurgery

## 2013-12-07 ENCOUNTER — Inpatient Hospital Stay (HOSPITAL_COMMUNITY): Payer: Medicare Other | Admitting: Anesthesiology

## 2013-12-07 ENCOUNTER — Encounter (HOSPITAL_COMMUNITY): Payer: Self-pay | Admitting: *Deleted

## 2013-12-07 DIAGNOSIS — Z885 Allergy status to narcotic agent status: Secondary | ICD-10-CM | POA: Diagnosis not present

## 2013-12-07 DIAGNOSIS — Z87891 Personal history of nicotine dependence: Secondary | ICD-10-CM | POA: Diagnosis not present

## 2013-12-07 DIAGNOSIS — F419 Anxiety disorder, unspecified: Secondary | ICD-10-CM | POA: Diagnosis present

## 2013-12-07 DIAGNOSIS — K3184 Gastroparesis: Secondary | ICD-10-CM | POA: Diagnosis present

## 2013-12-07 DIAGNOSIS — Z886 Allergy status to analgesic agent status: Secondary | ICD-10-CM | POA: Diagnosis not present

## 2013-12-07 DIAGNOSIS — Z419 Encounter for procedure for purposes other than remedying health state, unspecified: Secondary | ICD-10-CM

## 2013-12-07 DIAGNOSIS — M1990 Unspecified osteoarthritis, unspecified site: Secondary | ICD-10-CM | POA: Diagnosis present

## 2013-12-07 DIAGNOSIS — I10 Essential (primary) hypertension: Secondary | ICD-10-CM | POA: Diagnosis present

## 2013-12-07 DIAGNOSIS — M5137 Other intervertebral disc degeneration, lumbosacral region: Secondary | ICD-10-CM | POA: Diagnosis present

## 2013-12-07 DIAGNOSIS — J45909 Unspecified asthma, uncomplicated: Secondary | ICD-10-CM | POA: Diagnosis present

## 2013-12-07 DIAGNOSIS — F329 Major depressive disorder, single episode, unspecified: Secondary | ICD-10-CM | POA: Diagnosis present

## 2013-12-07 DIAGNOSIS — M4806 Spinal stenosis, lumbar region: Secondary | ICD-10-CM | POA: Diagnosis present

## 2013-12-07 DIAGNOSIS — Z888 Allergy status to other drugs, medicaments and biological substances status: Secondary | ICD-10-CM | POA: Diagnosis not present

## 2013-12-07 DIAGNOSIS — Z880 Allergy status to penicillin: Secondary | ICD-10-CM | POA: Diagnosis not present

## 2013-12-07 DIAGNOSIS — K219 Gastro-esophageal reflux disease without esophagitis: Secondary | ICD-10-CM | POA: Diagnosis present

## 2013-12-07 DIAGNOSIS — Z881 Allergy status to other antibiotic agents status: Secondary | ICD-10-CM

## 2013-12-07 DIAGNOSIS — G629 Polyneuropathy, unspecified: Secondary | ICD-10-CM | POA: Diagnosis present

## 2013-12-07 DIAGNOSIS — E119 Type 2 diabetes mellitus without complications: Secondary | ICD-10-CM | POA: Diagnosis present

## 2013-12-07 DIAGNOSIS — M48061 Spinal stenosis, lumbar region without neurogenic claudication: Secondary | ICD-10-CM | POA: Diagnosis present

## 2013-12-07 HISTORY — PX: LUMBAR LAMINECTOMY/DECOMPRESSION MICRODISCECTOMY: SHX5026

## 2013-12-07 LAB — GLUCOSE, CAPILLARY
GLUCOSE-CAPILLARY: 150 mg/dL — AB (ref 70–99)
Glucose-Capillary: 106 mg/dL — ABNORMAL HIGH (ref 70–99)
Glucose-Capillary: 103 mg/dL — ABNORMAL HIGH (ref 70–99)
Glucose-Capillary: 138 mg/dL — ABNORMAL HIGH (ref 70–99)

## 2013-12-07 SURGERY — LUMBAR LAMINECTOMY/DECOMPRESSION MICRODISCECTOMY 1 LEVEL
Anesthesia: General | Laterality: Right

## 2013-12-07 MED ORDER — ZOLPIDEM TARTRATE 5 MG PO TABS
5.0000 mg | ORAL_TABLET | Freq: Every evening | ORAL | Status: DC | PRN
  Administered 2013-12-08: 5 mg via ORAL
  Filled 2013-12-07: qty 1

## 2013-12-07 MED ORDER — SODIUM CHLORIDE 0.9 % IV SOLN
INTRAVENOUS | Status: DC
Start: 2013-12-07 — End: 2013-12-08

## 2013-12-07 MED ORDER — ONDANSETRON HCL 4 MG/2ML IJ SOLN
INTRAMUSCULAR | Status: AC
  Filled 2013-12-07: qty 2

## 2013-12-07 MED ORDER — ONDANSETRON HCL 4 MG/2ML IJ SOLN: 4.0000 mg | Freq: Four times a day (QID) | INTRAMUSCULAR | Status: DC | PRN

## 2013-12-07 MED ORDER — LACTATED RINGERS IV SOLN
INTRAVENOUS | Status: DC | PRN
  Administered 2013-12-07 (×2): via INTRAVENOUS

## 2013-12-07 MED ORDER — PANTOPRAZOLE SODIUM 40 MG PO TBEC
40.0000 mg | DELAYED_RELEASE_TABLET | Freq: Every day | ORAL | Status: DC
  Administered 2013-12-08 – 2013-12-09 (×2): 40 mg via ORAL
  Filled 2013-12-07 (×2): qty 1

## 2013-12-07 MED ORDER — SODIUM CHLORIDE 0.9 % IJ SOLN
INTRAMUSCULAR | Status: AC
  Filled 2013-12-07: qty 10

## 2013-12-07 MED ORDER — AMLODIPINE BESYLATE 5 MG PO TABS: 5.0000 mg | ORAL_TABLET | Freq: Every day | ORAL | Status: DC

## 2013-12-07 MED ORDER — BUPROPION HCL ER (XL) 150 MG PO TB24
450.0000 mg | ORAL_TABLET | Freq: Every day | ORAL | Status: DC
  Administered 2013-12-08 – 2013-12-09 (×2): 450 mg via ORAL
  Filled 2013-12-07 (×3): qty 3

## 2013-12-07 MED ORDER — MIDAZOLAM HCL 2 MG/2ML IJ SOLN
INTRAMUSCULAR | Status: AC
  Filled 2013-12-07: qty 2

## 2013-12-07 MED ORDER — FENTANYL CITRATE 0.05 MG/ML IJ SOLN
25.0000 ug | INTRAMUSCULAR | Status: DC | PRN
  Administered 2013-12-07 (×2): 25 ug via INTRAVENOUS

## 2013-12-07 MED ORDER — FENTANYL 10 MCG/ML IV SOLN
INTRAVENOUS | Status: DC
  Administered 2013-12-07: 16:00:00 via INTRAVENOUS
  Administered 2013-12-07: 38.01 ug via INTRAVENOUS
  Administered 2013-12-08: 2.98 ug via INTRAVENOUS
  Administered 2013-12-08: 30 ug via INTRAVENOUS
  Filled 2013-12-07 (×2): qty 50

## 2013-12-07 MED ORDER — LIDOCAINE HCL (CARDIAC) 20 MG/ML IV SOLN
INTRAVENOUS | Status: AC
  Filled 2013-12-07: qty 5

## 2013-12-07 MED ORDER — EPHEDRINE SULFATE 50 MG/ML IJ SOLN
INTRAMUSCULAR | Status: DC | PRN
  Administered 2013-12-07 (×2): 10 mg via INTRAVENOUS
  Administered 2013-12-07: 15 mg via INTRAVENOUS

## 2013-12-07 MED ORDER — FENTANYL CITRATE 0.05 MG/ML IJ SOLN
INTRAMUSCULAR | Status: AC
  Filled 2013-12-07: qty 2

## 2013-12-07 MED ORDER — CEFAZOLIN SODIUM 1-5 GM-% IV SOLN: 1.0000 g | Freq: Three times a day (TID) | INTRAVENOUS | Status: DC

## 2013-12-07 MED ORDER — DIPHENHYDRAMINE HCL 12.5 MG/5ML PO ELIX: 12.5000 mg | ORAL_SOLUTION | Freq: Four times a day (QID) | ORAL | Status: DC | PRN

## 2013-12-07 MED ORDER — METFORMIN HCL 500 MG PO TABS
500.0000 mg | ORAL_TABLET | Freq: Every day | ORAL | Status: DC
  Administered 2013-12-08 – 2013-12-09 (×2): 500 mg via ORAL
  Filled 2013-12-07 (×2): qty 1

## 2013-12-07 MED ORDER — THROMBIN 5000 UNITS EX SOLR
CUTANEOUS | Status: DC | PRN
  Administered 2013-12-07 (×2): 5000 [IU] via TOPICAL

## 2013-12-07 MED ORDER — DULOXETINE HCL 60 MG PO CPEP
150.0000 mg | ORAL_CAPSULE | Freq: Every day | ORAL | Status: DC
  Administered 2013-12-08 – 2013-12-09 (×2): 150 mg via ORAL
  Filled 2013-12-07: qty 1
  Filled 2013-12-07: qty 2
  Filled 2013-12-07: qty 1
  Filled 2013-12-07: qty 2

## 2013-12-07 MED ORDER — METHYLPREDNISOLONE ACETATE 80 MG/ML IJ SUSP
INTRAMUSCULAR | Status: DC | PRN
  Administered 2013-12-07: 80 mg

## 2013-12-07 MED ORDER — ZIPRASIDONE HCL 20 MG PO CAPS
20.0000 mg | ORAL_CAPSULE | Freq: Every morning | ORAL | Status: DC
  Filled 2013-12-07: qty 1

## 2013-12-07 MED ORDER — BENZTROPINE MESYLATE 0.5 MG PO TABS
0.5000 mg | ORAL_TABLET | Freq: Every day | ORAL | Status: DC
  Administered 2013-12-07 – 2013-12-08 (×2): 0.5 mg via ORAL
  Filled 2013-12-07 (×3): qty 1

## 2013-12-07 MED ORDER — PROPOFOL 10 MG/ML IV BOLUS
INTRAVENOUS | Status: AC
  Filled 2013-12-07: qty 20

## 2013-12-07 MED ORDER — ALBUTEROL SULFATE (2.5 MG/3ML) 0.083% IN NEBU: 2.5000 mg | INHALATION_SOLUTION | Freq: Four times a day (QID) | RESPIRATORY_TRACT | Status: DC | PRN

## 2013-12-07 MED ORDER — ZIPRASIDONE HCL 20 MG PO CAPS
20.0000 mg | ORAL_CAPSULE | Freq: Every day | ORAL | Status: DC
  Administered 2013-12-08 – 2013-12-09 (×2): 20 mg via ORAL
  Filled 2013-12-07 (×3): qty 1

## 2013-12-07 MED ORDER — MIDAZOLAM HCL 5 MG/5ML IJ SOLN
INTRAMUSCULAR | Status: DC | PRN
  Administered 2013-12-07: 2 mg via INTRAVENOUS

## 2013-12-07 MED ORDER — ONDANSETRON HCL 4 MG/2ML IJ SOLN
INTRAMUSCULAR | Status: DC | PRN
Start: 2013-12-07 — End: 2013-12-07
  Administered 2013-12-07: 4 mg via INTRAVENOUS

## 2013-12-07 MED ORDER — BUPIVACAINE-EPINEPHRINE (PF) 0.5% -1:200000 IJ SOLN
INTRAMUSCULAR | Status: DC | PRN
  Administered 2013-12-07: 10 mL via PERINEURAL

## 2013-12-07 MED ORDER — SODIUM CHLORIDE 0.9 % IJ SOLN: 9.0000 mL | INTRAMUSCULAR | Status: DC | PRN

## 2013-12-07 MED ORDER — SODIUM CHLORIDE 0.9 % IJ SOLN: 3.0000 mL | INTRAMUSCULAR | Status: DC | PRN

## 2013-12-07 MED ORDER — PROPOFOL 10 MG/ML IV BOLUS
INTRAVENOUS | Status: DC | PRN
  Administered 2013-12-07: 130 mg via INTRAVENOUS

## 2013-12-07 MED ORDER — EPHEDRINE SULFATE 50 MG/ML IJ SOLN
INTRAMUSCULAR | Status: AC
  Filled 2013-12-07: qty 1

## 2013-12-07 MED ORDER — GLYCOPYRROLATE 0.2 MG/ML IJ SOLN
INTRAMUSCULAR | Status: DC | PRN
  Administered 2013-12-07: 0.4 mg via INTRAVENOUS
  Administered 2013-12-07: 0.2 mg via INTRAVENOUS

## 2013-12-07 MED ORDER — FLUTICASONE PROPIONATE 50 MCG/ACT NA SUSP
2.0000 | Freq: Every day | NASAL | Status: DC
  Filled 2013-12-07: qty 16

## 2013-12-07 MED ORDER — TRAZODONE HCL 50 MG PO TABS
50.0000 mg | ORAL_TABLET | Freq: Every day | ORAL | Status: DC
  Administered 2013-12-08: 50 mg via ORAL
  Filled 2013-12-07 (×2): qty 1

## 2013-12-07 MED ORDER — VANCOMYCIN HCL IN DEXTROSE 750-5 MG/150ML-% IV SOLN
750.0000 mg | Freq: Once | INTRAVENOUS | Status: AC
  Administered 2013-12-08: 750 mg via INTRAVENOUS
  Filled 2013-12-07: qty 150

## 2013-12-07 MED ORDER — PNEUMOCOCCAL VAC POLYVALENT 25 MCG/0.5ML IJ INJ
0.5000 mL | INJECTION | INTRAMUSCULAR | Status: AC
  Administered 2013-12-08: 0.5 mL via INTRAMUSCULAR
  Filled 2013-12-07: qty 0.5

## 2013-12-07 MED ORDER — ZIPRASIDONE HCL 80 MG PO CAPS
80.0000 mg | ORAL_CAPSULE | Freq: Every day | ORAL | Status: DC
  Administered 2013-12-07 – 2013-12-08 (×2): 80 mg via ORAL
  Filled 2013-12-07 (×3): qty 1

## 2013-12-07 MED ORDER — LIDOCAINE HCL (CARDIAC) 20 MG/ML IV SOLN
INTRAVENOUS | Status: DC | PRN
  Administered 2013-12-07: 60 mg via INTRAVENOUS

## 2013-12-07 MED ORDER — ALBUTEROL SULFATE HFA 108 (90 BASE) MCG/ACT IN AERS
2.0000 | INHALATION_SPRAY | Freq: Four times a day (QID) | RESPIRATORY_TRACT | Status: DC | PRN
  Filled 2013-12-07: qty 6.7

## 2013-12-07 MED ORDER — DULOXETINE HCL 30 MG PO CPEP: 30.0000 mg | ORAL_CAPSULE | Freq: Every day | ORAL | Status: DC

## 2013-12-07 MED ORDER — BUPROPION HCL ER (XL) 150 MG PO TB24: 150.0000 mg | ORAL_TABLET | Freq: Every day | ORAL | Status: DC

## 2013-12-07 MED ORDER — SUCCINYLCHOLINE CHLORIDE 20 MG/ML IJ SOLN
INTRAMUSCULAR | Status: AC
  Filled 2013-12-07: qty 1

## 2013-12-07 MED ORDER — LOSARTAN POTASSIUM 50 MG PO TABS: 50.0000 mg | ORAL_TABLET | Freq: Every day | ORAL | Status: DC

## 2013-12-07 MED ORDER — GABAPENTIN 100 MG PO CAPS
100.0000 mg | ORAL_CAPSULE | Freq: Two times a day (BID) | ORAL | Status: DC
  Administered 2013-12-07 – 2013-12-09 (×4): 100 mg via ORAL
  Filled 2013-12-07 (×4): qty 1

## 2013-12-07 MED ORDER — FENTANYL CITRATE 0.05 MG/ML IJ SOLN
INTRAMUSCULAR | Status: AC
  Filled 2013-12-07: qty 5

## 2013-12-07 MED ORDER — ARTIFICIAL TEARS OP OINT
TOPICAL_OINTMENT | OPHTHALMIC | Status: AC
  Filled 2013-12-07: qty 3.5

## 2013-12-07 MED ORDER — HEMOSTATIC AGENTS (NO CHARGE) OPTIME
TOPICAL | Status: DC | PRN
  Administered 2013-12-07: 1 via TOPICAL

## 2013-12-07 MED ORDER — MENTHOL 3 MG MT LOZG: 1.0000 | LOZENGE | OROMUCOSAL | Status: DC | PRN

## 2013-12-07 MED ORDER — ZIPRASIDONE HCL 40 MG PO CAPS
40.0000 mg | ORAL_CAPSULE | Freq: Every day | ORAL | Status: DC
  Administered 2013-12-07 – 2013-12-08 (×2): 40 mg via ORAL
  Filled 2013-12-07 (×3): qty 1

## 2013-12-07 MED ORDER — ROCURONIUM BROMIDE 50 MG/5ML IV SOLN
INTRAVENOUS | Status: AC
  Filled 2013-12-07: qty 1

## 2013-12-07 MED ORDER — FENTANYL CITRATE 0.05 MG/ML IJ SOLN
INTRAMUSCULAR | Status: DC | PRN
  Administered 2013-12-07 (×2): 50 ug via INTRAVENOUS

## 2013-12-07 MED ORDER — SODIUM CHLORIDE 0.9 % IV SOLN: 250.0000 mL | INTRAVENOUS | Status: DC

## 2013-12-07 MED ORDER — MOMETASONE FURO-FORMOTEROL FUM 100-5 MCG/ACT IN AERO
2.0000 | INHALATION_SPRAY | Freq: Two times a day (BID) | RESPIRATORY_TRACT | Status: DC
  Administered 2013-12-08 – 2013-12-09 (×2): 2 via RESPIRATORY_TRACT
  Filled 2013-12-07: qty 8.8

## 2013-12-07 MED ORDER — PHENOL 1.4 % MT LIQD: 1.0000 | OROMUCOSAL | Status: DC | PRN

## 2013-12-07 MED ORDER — HYDROCHLOROTHIAZIDE 25 MG PO TABS: 25.0000 mg | ORAL_TABLET | Freq: Every day | ORAL | Status: DC

## 2013-12-07 MED ORDER — PROMETHAZINE HCL 25 MG/ML IJ SOLN: 6.2500 mg | INTRAMUSCULAR | Status: DC | PRN

## 2013-12-07 MED ORDER — SODIUM CHLORIDE 0.9 % IJ SOLN: 3.0000 mL | Freq: Two times a day (BID) | INTRAMUSCULAR | Status: DC

## 2013-12-07 MED ORDER — METOPROLOL TARTRATE 50 MG PO TABS
50.0000 mg | ORAL_TABLET | Freq: Two times a day (BID) | ORAL | Status: DC
  Administered 2013-12-08: 50 mg via ORAL
  Filled 2013-12-07: qty 1

## 2013-12-07 MED ORDER — DIAZEPAM 5 MG PO TABS: 5.0000 mg | ORAL_TABLET | Freq: Four times a day (QID) | ORAL | Status: DC | PRN

## 2013-12-07 MED ORDER — NALOXONE HCL 0.4 MG/ML IJ SOLN: 0.4000 mg | INTRAMUSCULAR | Status: DC | PRN

## 2013-12-07 MED ORDER — DIPHENHYDRAMINE HCL 50 MG/ML IJ SOLN: 12.5000 mg | Freq: Four times a day (QID) | INTRAMUSCULAR | Status: DC | PRN

## 2013-12-07 MED ORDER — MEPERIDINE HCL 25 MG/ML IJ SOLN
6.2500 mg | INTRAMUSCULAR | Status: DC | PRN
Start: 2013-12-07 — End: 2013-12-07

## 2013-12-07 MED ORDER — ONDANSETRON HCL 4 MG/2ML IJ SOLN: 4.0000 mg | INTRAMUSCULAR | Status: DC | PRN

## 2013-12-07 MED ORDER — LACTATED RINGERS IV SOLN
INTRAVENOUS | Status: DC
  Administered 2013-12-07: 50 mL/h via INTRAVENOUS

## 2013-12-07 MED ORDER — NEOSTIGMINE METHYLSULFATE 10 MG/10ML IV SOLN
INTRAVENOUS | Status: DC | PRN
  Administered 2013-12-07: 3 mg via INTRAVENOUS

## 2013-12-07 MED ORDER — 0.9 % SODIUM CHLORIDE (POUR BTL) OPTIME
TOPICAL | Status: DC | PRN
  Administered 2013-12-07: 1000 mL

## 2013-12-07 MED ORDER — ALBUTEROL SULFATE HFA 108 (90 BASE) MCG/ACT IN AERS: 2.0000 | INHALATION_SPRAY | Freq: Four times a day (QID) | RESPIRATORY_TRACT | Status: DC | PRN

## 2013-12-07 MED ORDER — ROCURONIUM BROMIDE 100 MG/10ML IV SOLN
INTRAVENOUS | Status: DC | PRN
  Administered 2013-12-07: 40 mg via INTRAVENOUS
  Administered 2013-12-07: 10 mg via INTRAVENOUS

## 2013-12-07 MED ORDER — CLONAZEPAM 0.5 MG PO TABS
0.5000 mg | ORAL_TABLET | Freq: Four times a day (QID) | ORAL | Status: DC
  Administered 2013-12-07 – 2013-12-09 (×6): 0.5 mg via ORAL
  Filled 2013-12-07 (×7): qty 1

## 2013-12-07 SURGICAL SUPPLY — 61 items
ADH SKN CLS APL DERMABOND .7 (GAUZE/BANDAGES/DRESSINGS) ×1
APL SKNCLS STERI-STRIP NONHPOA (GAUZE/BANDAGES/DRESSINGS) ×1
BENZOIN TINCTURE PRP APPL 2/3 (GAUZE/BANDAGES/DRESSINGS) ×2 IMPLANT
BLADE CLIPPER SURG (BLADE) IMPLANT
BUR ACORN 6.0 (BURR) ×2 IMPLANT
BUR MATCHSTICK NEURO 3.0 LAGG (BURR) ×1 IMPLANT
CANISTER SUCT 3000ML (MISCELLANEOUS) ×2 IMPLANT
CONT SPEC 4OZ CLIKSEAL STRL BL (MISCELLANEOUS) ×2 IMPLANT
DERMABOND ADVANCED (GAUZE/BANDAGES/DRESSINGS) ×1
DERMABOND ADVANCED .7 DNX12 (GAUZE/BANDAGES/DRESSINGS) IMPLANT
DRAPE LAPAROTOMY 100X72X124 (DRAPES) ×2 IMPLANT
DRAPE MICROSCOPE LEICA (MISCELLANEOUS) ×2 IMPLANT
DRAPE POUCH INSTRU U-SHP 10X18 (DRAPES) ×2 IMPLANT
DRSG AQUACEL AG ADV 3.5X 4 (GAUZE/BANDAGES/DRESSINGS) ×1 IMPLANT
DRSG PAD ABDOMINAL 8X10 ST (GAUZE/BANDAGES/DRESSINGS) IMPLANT
DURAPREP 26ML APPLICATOR (WOUND CARE) ×2 IMPLANT
ELECT REM PT RETURN 9FT ADLT (ELECTROSURGICAL) ×2
ELECTRODE REM PT RTRN 9FT ADLT (ELECTROSURGICAL) ×1 IMPLANT
GAUZE SPONGE 4X4 12PLY STRL (GAUZE/BANDAGES/DRESSINGS) ×1 IMPLANT
GAUZE SPONGE 4X4 16PLY XRAY LF (GAUZE/BANDAGES/DRESSINGS) IMPLANT
GLOVE BIOGEL M 8.0 STRL (GLOVE) ×2 IMPLANT
GLOVE ECLIPSE 7.0 STRL STRAW (GLOVE) ×1 IMPLANT
GLOVE ECLIPSE 7.5 STRL STRAW (GLOVE) ×3 IMPLANT
GLOVE EXAM NITRILE LRG STRL (GLOVE) IMPLANT
GLOVE EXAM NITRILE MD LF STRL (GLOVE) IMPLANT
GLOVE EXAM NITRILE XL STR (GLOVE) IMPLANT
GLOVE EXAM NITRILE XS STR PU (GLOVE) IMPLANT
GLOVE INDICATOR 7.5 STRL GRN (GLOVE) ×4 IMPLANT
GOWN STRL REUS W/ TWL LRG LVL3 (GOWN DISPOSABLE) ×1 IMPLANT
GOWN STRL REUS W/ TWL XL LVL3 (GOWN DISPOSABLE) IMPLANT
GOWN STRL REUS W/TWL 2XL LVL3 (GOWN DISPOSABLE) IMPLANT
GOWN STRL REUS W/TWL LRG LVL3 (GOWN DISPOSABLE) ×4
GOWN STRL REUS W/TWL XL LVL3 (GOWN DISPOSABLE) ×2
KIT BASIN OR (CUSTOM PROCEDURE TRAY) ×2 IMPLANT
KIT ROOM TURNOVER OR (KITS) ×2 IMPLANT
NDL HYPO 18GX1.5 BLUNT FILL (NEEDLE) IMPLANT
NDL HYPO 21X1.5 SAFETY (NEEDLE) IMPLANT
NDL HYPO 25X1 1.5 SAFETY (NEEDLE) IMPLANT
NDL SPNL 20GX3.5 QUINCKE YW (NEEDLE) IMPLANT
NEEDLE HYPO 18GX1.5 BLUNT FILL (NEEDLE) ×2 IMPLANT
NEEDLE HYPO 21X1.5 SAFETY (NEEDLE) IMPLANT
NEEDLE HYPO 25X1 1.5 SAFETY (NEEDLE) IMPLANT
NEEDLE SPNL 20GX3.5 QUINCKE YW (NEEDLE) IMPLANT
NS IRRIG 1000ML POUR BTL (IV SOLUTION) ×2 IMPLANT
PACK LAMINECTOMY NEURO (CUSTOM PROCEDURE TRAY) ×2 IMPLANT
PAD ARMBOARD 7.5X6 YLW CONV (MISCELLANEOUS) ×6 IMPLANT
PATTIES SURGICAL .5 X1 (DISPOSABLE) ×2 IMPLANT
RUBBERBAND STERILE (MISCELLANEOUS) ×4 IMPLANT
SPONGE LAP 4X18 X RAY DECT (DISPOSABLE) IMPLANT
SPONGE SURGIFOAM ABS GEL SZ50 (HEMOSTASIS) ×2 IMPLANT
STRIP CLOSURE SKIN 1/2X4 (GAUZE/BANDAGES/DRESSINGS) ×2 IMPLANT
SUT VIC AB 0 CT1 18XCR BRD8 (SUTURE) ×1 IMPLANT
SUT VIC AB 0 CT1 8-18 (SUTURE) ×2
SUT VIC AB 2-0 CP2 18 (SUTURE) ×2 IMPLANT
SUT VIC AB 3-0 SH 8-18 (SUTURE) ×2 IMPLANT
SYR 20CC LL (SYRINGE) IMPLANT
SYR 20ML ECCENTRIC (SYRINGE) ×2 IMPLANT
SYR 5ML LL (SYRINGE) ×1 IMPLANT
TOWEL OR 17X24 6PK STRL BLUE (TOWEL DISPOSABLE) ×2 IMPLANT
TOWEL OR 17X26 10 PK STRL BLUE (TOWEL DISPOSABLE) ×2 IMPLANT
WATER STERILE IRR 1000ML POUR (IV SOLUTION) ×2 IMPLANT

## 2013-12-07 NOTE — Transfer of Care (Signed)
Immediate Anesthesia Transfer of Care Note  Patient: Becky [name]  Procedure(s) Performed: Procedure(s): LUMBAR LAMINECTOMY/DECOMPRESSION MICRODISCECTOMY RIGHT  LUMBAR FIVE-SACRAL ONE ,FORAMINOTOMY (Right)  Patient Location: PACU  Anesthesia Type:General  Level of Consciousness: awake, alert  and oriented  Airway & Oxygen Therapy: Patient Spontanous Breathing and Patient connected to nasal cannula oxygen  Post-op Assessment: Report given to PACU RN and Patient moving all extremities X 4  Post vital signs: Reviewed and stable  Complications: No apparent anesthesia complications

## 2013-12-07 NOTE — Anesthesia Postprocedure Evaluation (Signed)
  Anesthesia Post-op Note  Patient: Becky [name]  Procedure(s) Performed: Procedure(s): LUMBAR LAMINECTOMY/DECOMPRESSION MICRODISCECTOMY RIGHT  LUMBAR FIVE-SACRAL ONE ,FORAMINOTOMY (Right)  Patient Location: PACU  Anesthesia Type:General  Level of Consciousness: awake  Airway and Oxygen Therapy: Patient Spontanous Breathing  Post-op Pain: mild  Post-op Assessment: Post-op Vital signs reviewed, Patient's Cardiovascular Status Stable and Respiratory Function Stable  Post-op Vital Signs: Reviewed  Last Vitals:  Filed Vitals:   12/07/13 0929  BP: 105/53  Pulse: 60  Temp: 36.2 C  Resp: 20    Complications: No apparent anesthesia complications

## 2013-12-07 NOTE — Progress Notes (Signed)
Patient is AAOx4 and walking to the bathroom with one assist. Patient has also voided. Doing well and will continue to monitor. Jaylend Reiland, Rande Brunt

## 2013-12-07 NOTE — Anesthesia Preprocedure Evaluation (Addendum)
Anesthesia Evaluation  Patient identified by MRN, date of birth, ID band Patient awake    Reviewed: Allergy & Precautions, H&P , NPO status , Patient's Chart, lab work & pertinent test results  Airway Mallampati: II   Neck ROM: Full    Dental  (+) Teeth Intact   Pulmonary asthma , former smoker,  inhalers breath sounds clear to auscultation        Cardiovascular hypertension, Pt. on medications Rhythm:Regular     Neuro/Psych Anxiety Identity disorder    GI/Hepatic GERD-  Medicated,  Endo/Other  diabetes, Well Controlled, Type 2, Oral Hypoglycemic Agents  Renal/GU      Musculoskeletal   Abdominal (+)  Abdomen: soft.    Peds  Hematology  (+) anemia , 12/36 H/H   Anesthesia Other Findings   Reproductive/Obstetrics                            Anesthesia Physical Anesthesia Plan  ASA: III  Anesthesia Plan: General   Post-op Pain Management:    Induction: Intravenous  Airway Management Planned: Oral ETT  Additional Equipment:   Intra-op Plan:   Post-operative Plan: Extubation in OR  Informed Consent: I have reviewed the patients History and Physical, chart, labs and discussed the procedure including the risks, benefits and alternatives for the proposed anesthesia with the patient or authorized representative who has indicated his/her understanding and acceptance.     Plan Discussed with:   Anesthesia Plan Comments: (Verify allergy to Tylenol, if not allergic will give, SEVO, possible post op nebulizer RX)        Anesthesia Quick Evaluation

## 2013-12-07 NOTE — OR Nursing (Signed)
FENTANYL 50 MCG/ML 2 ML GIVEN BY DR Joya Salm AT SURGICAL SITE

## 2013-12-08 ENCOUNTER — Encounter (HOSPITAL_COMMUNITY): Payer: Self-pay | Admitting: Neurosurgery

## 2013-12-08 DIAGNOSIS — M4806 Spinal stenosis, lumbar region: Secondary | ICD-10-CM | POA: Diagnosis not present

## 2013-12-08 LAB — GLUCOSE, CAPILLARY
GLUCOSE-CAPILLARY: 133 mg/dL — AB (ref 70–99)
GLUCOSE-CAPILLARY: 147 mg/dL — AB (ref 70–99)
Glucose-Capillary: 152 mg/dL — ABNORMAL HIGH (ref 70–99)
Glucose-Capillary: 109 mg/dL — ABNORMAL HIGH (ref 70–99)

## 2013-12-08 MED ORDER — HYDROCODONE-ACETAMINOPHEN 7.5-325 MG/15ML PO SOLN
10.0000 mL | ORAL | Status: DC | PRN
  Administered 2013-12-08 – 2013-12-09 (×6): 10 mL via ORAL
  Filled 2013-12-08 (×6): qty 15

## 2013-12-08 NOTE — Op Note (Signed)
NAMECHRISTYANN, [name]         ACCOUNT NO.:  [id number]  MEDICAL RECORD NO.:  [id number]  LOCATION:                                 FACILITY:  PHYSICIAN:  Leeroy Cha, M.D.   DATE OF BIRTH:  04-Feb-1956  DATE OF PROCEDURE:  12/06/2013 DATE OF DISCHARGE:  12/09/2013                              OPERATIVE REPORT   PREOPERATIVE DIAGNOSIS:  Chronic L5-S1 radiculopathy.  Right L5-S1 foraminal stenosis.  Degenerative disk disease L5-S1.  POSTOPERATIVE DIAGNOSIS:  Chronic L5-S1 radiculopathy.  Right L5-S1 foraminal stenosis.  Degenerative disk disease L5-S1.  PROCEDURE:  Right L5-S1 laminotomy and foraminotomy to decompress the L5 and S1 nerve root.  Microscope.  SURGEON:  Leeroy Cha, M.D.  ASSISTANT:  Dr. Kathyrn Sheriff.  CLINICAL HISTORY:  The patient had been seen in my office complaining of back pain _with radiation_________ to the left leg.  She had failed conservative treatment.  MRI showed that she has mostly finding to the left side including extraforaminal disk at the level of L5-S1 to the left. Nothing was found on the right side.  The myelogram showed that she has had degenerative disk disease with foraminal narrow, mostly on the right side with a bulging disk.  In view of no improvement with several treatment including epidural injection, surgery was advised.  She knew the risk of the surgery including possibility of further surgery.  DESCRIPTION OF PROCEDURE:  The patient was taken to the OR.  After intubation, she was positioned in prone manner.  The area was cleaned with Betadine and DuraPrep.  Drapes were applied.  We were unable to feel any bony structure.  A midline incision was made through the skin through a thick adipose tissue, straight down to the fascia.  The fascia was opened on the right side.  X-ray showed that we were at the level L5- S1 on the right side.  Then with the help of the microscope and the drill, we removed the lower part of L5 _and the  upper_________ S1 and one-third medial facet.  Thick yellow ligament was also excised.  We retracted the thecal sac and there was a bulging disk going laterally.  There was some mild protrusion, but indeed there was no ruptured disk but bulging __________.  The foramen was quite narrow.  We did a foraminotomy using 1, 2, and 3 mm Kerrison punch to decompress the L5-S1 nerve root.  At the end, we have good space for the L5-S1 nerve root as well as the thecal sac.  The area was irrigated. Valsalva maneuver was negative.  Depo-Medrol and fentanyl were left in the epidural space and the wound was closed with Vicryl and Steri- Strips.          ______________________________ Leeroy Cha, M.D.     EB/MEDQ  D:  12/07/2013  T:  12/07/2013  Job:  [id number]

## 2013-12-08 NOTE — Progress Notes (Signed)
Patient ID: Becky [name], female   DOB: [birth year], 58 y.o.   MRN: [id number] No right leg pain as preop. No wweakness. Feels tired. To be off fentanyl

## 2013-12-08 NOTE — Progress Notes (Signed)
Occupational Therapy Evaluation Patient Details Name: Becky [name] MRN: [id number] DOB: [birth year] Today's Date: 12/08/2013    History of Present Illness Becky [name] is a 58 y.o. Female s/p lumbar laminectomy/decompression 1 level on 12/07/13. PMH of DM, tremor, atrhitis, HTN, anxiety, peripheral neuropathy, and depression.    Clinical Impression   PTA pt lived at home alone and was independent with ADLs and functional mobility. Pt is mobilizing well however limited by 8/10 pain when walking despite PCA. Pt will benefit from acute OT to address LB ADLs and functional mobility prior to d/c home.     Follow Up Recommendations  No OT follow up;Supervision/Assistance - 24 hour    Equipment Recommendations  3 in 1 bedside comode    Recommendations for Other Services       Precautions / Restrictions Precautions Precautions: Back Precaution Booklet Issued: Yes (comment) Precaution Comments: Educated pt on 3/3 back precautions and incorporating into ADLs.  Restrictions Weight Bearing Restrictions: No      Mobility Bed Mobility               General bed mobility comments: Pt sitting up in recliner. Reviewed log roll technique for bed mobility.  Transfers Overall transfer level: Needs assistance Equipment used: Rolling walker (2 wheeled) Transfers: Sit to/from Stand Sit to Stand: Min guard         General transfer comment: Pt with good hand placement and technique. Min guard due to safety and pain.          ADL Overall ADL's : Needs assistance/impaired Eating/Feeding: Independent;Sitting   Grooming: Set up;Sitting   Upper Body Bathing: Set up;Sitting   Lower Body Bathing: Min guard;Sit to/from stand   Upper Body Dressing : Set up;Sitting   Lower Body Dressing: Minimal assistance;Sit to/from stand Lower Body Dressing Details (indicate cue type and reason): due to pain; once controlled pt will be able to cross legs to reach Bil  socks Toilet Transfer: Min guard;Ambulation;RW     Toileting - Clothing Manipulation Details (indicate cue type and reason): educated pt on use of tongs and baby wipes for increased independence with toilet hygiene.    Tub/Shower Transfer Details (indicate cue type and reason): educated pt on safe tub transfer and encouraged not to attempt until improved mobility and someone available to assist Functional mobility during ADLs: Min guard;Rolling walker General ADL Comments: Pt is mobilizing well and educated on incorporating precautions into ADLs. Pt appears slightly anxious however all questions addressed. Pt limited by pain and encouraged PCA.      Vision  Pt wears glasses at all times and reports no change from baseline.                    Perception Perception Perception Tested?: No   Praxis Praxis Praxis tested?: Within functional limits    Pertinent Vitals/Pain Pain Assessment: 0-10 Pain Score: 8  Pain Location: back, surgical Pain Descriptors / Indicators: Sore (surgical) Pain Intervention(s): Limited activity within patient's tolerance;Monitored during session;Repositioned;PCA encouraged     Hand Dominance     Extremity/Trunk Assessment Upper Extremity Assessment Upper Extremity Assessment: Overall WFL for tasks assessed   Lower Extremity Assessment Lower Extremity Assessment: Defer to PT evaluation   Cervical / Trunk Assessment Cervical / Trunk Assessment: Normal   Communication Communication Communication: No difficulties   Cognition Arousal/Alertness: Awake/alert Behavior During Therapy: WFL for tasks assessed/performed;Anxious Overall Cognitive Status: Within Functional Limits for tasks assessed  Home Living Family/patient expects to be discharged to:: Private residence Living Arrangements: Parent (plans to stay with parents ~10 days) Available Help at Discharge: Family;Available 24 hours/day Type of  Home: House Home Access: Stairs to enter [employer] of Steps: 1   Home Layout: One level     Bathroom Shower/Tub: Tub/shower unit Shower/tub characteristics: Curtain Biochemist, clinical: Standard     Home Equipment: Environmental consultant - 2 wheels;Shower seat   Additional Comments: Pt plans to stay with her parents for ~10 days. Pt lives in a 2nd floor condo with ~18 stairs to get inside and has a small dog who needs to be let out frequently during the day.       Prior Functioning/Environment Level of Independence: Independent             OT Diagnosis: Generalized weakness;Acute pain   OT Problem List: Decreased strength;Decreased range of motion;Decreased activity tolerance;Impaired balance (sitting and/or standing);Decreased knowledge of use of DME or AE;Decreased knowledge of precautions;Pain   OT Treatment/Interventions: Self-care/ADL training;Energy conservation;DME and/or AE instruction;Therapeutic activities;Patient/family education;Balance training    OT Goals(Current goals can be found in the care plan section) Acute Rehab OT Goals Patient Stated Goal: to have less pain when walking OT Goal Formulation: With patient Time For Goal Achievement: 12/22/13 Potential to Achieve Goals: Good ADL Goals Pt Will Perform Grooming: with supervision;standing Pt Will Perform Lower Body Bathing: with supervision;with set-up;sit to/from stand Pt Will Perform Lower Body Dressing: with set-up;with supervision;sit to/from stand Pt Will Transfer to Toilet: with set-up;with supervision;bedside commode;ambulating Pt Will Perform Toileting - Clothing Manipulation and hygiene: with set-up;with supervision;with adaptive equipment;sit to/from stand Pt Will Perform Tub/Shower Transfer: with supervision;ambulating;shower seat;grab bars;rolling walker  OT Frequency: Min 2X/week    End of Session Equipment Utilized During Treatment: Gait belt;Rolling walker Nurse Communication: Mobility  status  Activity Tolerance: Patient tolerated treatment well Patient left: in chair;with call bell/phone within reach;with chair alarm set;with family/visitor present   Time: 0826-0902 OT Time Calculation (min): 36 min Charges:  OT General Charges $OT Visit: 1 Procedure OT Evaluation $Initial OT Evaluation Tier I: 1 Procedure OT Treatments $Self Care/Home Management : 8-22 mins $Therapeutic Activity: 8-22 mins  Juluis Rainier 12/08/2013, 9:12 AM  Cyndie Chime, OTR/L Occupational Therapist [phone] (pager)

## 2013-12-08 NOTE — Progress Notes (Signed)
Surgical dressing was rolling from bottom of dressing.  Steri strips were compromised, sterile dressing reapplied using sterile technique after cleansing with sterile NS.  Incision edges well approximated.

## 2013-12-08 NOTE — Progress Notes (Signed)
CARE MANAGEMENT NOTE 12/08/2013  Patient:  Becky [name], Becky [name]   Account Number:  [id number]  Date Initiated:  12/08/2013  Documentation initiated by:  Olga Coaster  Subjective/Objective Assessment:   ADMITTED FOR SURGERY     Action/Plan:   CM FOLLOWING FOR DCP   Anticipated DC Date:  12/11/2013   Anticipated DC Plan:  AWAITING FOR PT/OT EVALS FOR DISCHARGE NEEDS WITH ATTENDING MD IN AGREEMENT     DC Planning Services  CM consult         Status of service:  In process, will continue to follow Medicare Important Message given?   (If response is "NO", the following Medicare IM given date fields will be blank)  Per UR Regulation:  Reviewed for med. necessity/level of care/duration of stay  Comments:  10/28/2015Mindi Slicker RN,BSN,MHA [phone]

## 2013-12-08 NOTE — Evaluation (Signed)
Physical Therapy Evaluation Patient Details Name: Becky [name] MRN: [id number] DOB: 05-Mar-1955 Today's Date: 12/08/2013   History of Present Illness  Becky [name]. Shear is a 58 y.o. Female s/p lumbar laminectomy/decompression 1 level on 12/07/13. PMH of DM, tremor, atrhitis, HTN, anxiety, peripheral neuropathy, and depression.    Clinical Impression  Patient presents with functional limitations due to deficits listed in PT problem (see below). Pt with pain and balance deficits limiting safe mobility. Education provided on back precautions. Some difficulty with bed mobility due to having an elevated bed height at home. Pt to stay with mother at discharge for supervision. Need to assess ability to negotiate steps prior to discharge. Pt would benefit from acute PT and HHPT to improve transfers, gait, balance and safe mobility to maximize independence so pt can return to PLOF.    Follow Up Recommendations Home health PT;Supervision/Assistance - 24 hour    Equipment Recommendations  None recommended by PT    Recommendations for Other Services       Precautions / Restrictions Precautions Precautions: Back Precaution Booklet Issued: Yes (comment) Precaution Comments: Reviewed back precautions. Restrictions Weight Bearing Restrictions: No      Mobility  Bed Mobility Overal bed mobility: Needs Assistance Bed Mobility: Sit to Sidelying;Rolling;Sidelying to Sit Rolling: Min guard Sidelying to sit: Min guard     Sit to sidelying: Min guard General bed mobility comments: VC's for log roll technique. Pt with high bed at home, raised bed height prior to transfer. Increased time. HOB flat, no use of rails to simulate home environment.  Transfers Overall transfer level: Needs assistance Equipment used: Rolling walker (2 wheeled) Transfers: Sit to/from Stand Sit to Stand: Min guard         General transfer comment: Pt with good hand placement and technique. Min guard due  to safety and pain.   Ambulation/Gait Ambulation/Gait assistance: Min guard Ambulation Distance (Feet): 100 Feet Assistive device: Rolling walker (2 wheeled) Gait Pattern/deviations: Step-through pattern;Decreased stride length Gait velocity: decreased Gait velocity interpretation: Below normal speed for age/gender General Gait Details: Slow, steady gait. VC for RW management and safety. Reminders for back precautions during gait.  Stairs            Wheelchair Mobility    Modified Rankin (Stroke Patients Only)       Balance Overall balance assessment: Needs assistance Sitting-balance support: Feet supported Sitting balance-Leahy Scale: Fair     Standing balance support: During functional activity Standing balance-Leahy Scale: Fair Standing balance comment: Able to stand for short periods without UE support however requires UE support for gait training for safety/balance and pain management.                             Pertinent Vitals/Pain Pain Assessment: 0-10 Pain Score: 7  Pain Location: back Pain Descriptors / Indicators: Sore Pain Intervention(s): Limited activity within patient's tolerance;Monitored during session;Repositioned    Home Living Family/patient expects to be discharged to:: Private residence Living Arrangements: Parent Available Help at Discharge: Family;Available 24 hours/day Type of Home: House Home Access: Stairs to enter   [employer] of Steps: 1 Home Layout: One level Home Equipment: Walker - 2 wheels;Shower seat Additional Comments: Pt plans to stay with her parents for ~10 days. Pt lives in a 2nd floor condo with ~18 stairs to get inside and has a small dog who needs to be let out frequently during the day.     Prior Function  Level of Independence: Independent               Hand Dominance        Extremity/Trunk Assessment   Upper Extremity Assessment: Overall WFL for tasks assessed            Lower Extremity Assessment: Generalized weakness      Cervical / Trunk Assessment: Normal  Communication   Communication: No difficulties  Cognition Arousal/Alertness: Awake/alert Behavior During Therapy: WFL for tasks assessed/performed Overall Cognitive Status: Within Functional Limits for tasks assessed                      General Comments General comments (skin integrity, edema, etc.): Pt on PCA pump - not used during PT evaluation. Sa02 stayed >90% on RA during gait training.     Exercises        Assessment/Plan    PT Assessment Patient needs continued PT services  PT Diagnosis Acute pain;Generalized weakness   PT Problem List Decreased strength;Pain;Decreased activity tolerance;Decreased balance;Decreased safety awareness;Decreased mobility;Decreased knowledge of precautions  PT Treatment Interventions Balance training;Gait training;Patient/family education;Functional mobility training;Therapeutic activities;Therapeutic exercise;Stair training   PT Goals (Current goals can be found in the Care Plan section) Acute Rehab PT Goals Patient Stated Goal: to return to PLOF. PT Goal Formulation: With patient Time For Goal Achievement: 12/22/13 Potential to Achieve Goals: Good    Frequency Min 5X/week   Barriers to discharge        Co-evaluation               End of Session Equipment Utilized During Treatment: Gait belt Activity Tolerance: Patient limited by pain Patient left: in chair;with call bell/phone within reach;with chair alarm set;with family/visitor present Nurse Communication: Mobility status;Precautions         Time: 0321-2248 PT Time Calculation (min): 32 min   Charges:   PT Evaluation $Initial PT Evaluation Tier I: 1 Procedure PT Treatments $Gait Training: 8-22 mins   PT G CodesCandy Sledge A 12/08/2013, Fraser, Cameron, DPT [phone]

## 2013-12-08 NOTE — Progress Notes (Signed)
Patient ID: Becky [name], female   DOB: [birth year], 58 y.o.   MRN: [id number] Patient tells me she can take hydrocodone

## 2013-12-09 LAB — GLUCOSE, CAPILLARY
Glucose-Capillary: 129 mg/dL — ABNORMAL HIGH (ref 70–99)
Glucose-Capillary: 94 mg/dL (ref 70–99)

## 2013-12-09 NOTE — Progress Notes (Signed)
Occupational Therapy Treatment Patient Details Name: Becky [name] MRN: [id number] DOB: Dec 22, 1955 Today's Date: 12/09/2013    History of present illness Becky [name]. Becky [name] is a 58 y.o. Female s/p lumbar laminectomy/decompression 1 level on 12/07/13. PMH of DM, tremor, atrhitis, HTN, anxiety, peripheral neuropathy, and depression.    OT comments  Patient resting in recliner upon arrival.  Engaged in sponge bath, dress, toilet transfer toileting, groom and practiced tub/shower transfers using shower chair.  Patient was able to complete all tasks without use of AE except when she dropped items on the floor she needed the reacher.  She will obtain one after discharge.  Patient did required vcs to adhere to back precautions for above tasks and was able to guide this OT to stabilize RW during tub/shower transfer as though she was guiding her mother.  Patient ready for discharge to parents home today when medically ready and she hopes to practice stairs with PT before she goes.    Follow Up Recommendations  No OT follow up;Supervision/Assistance - 24 hour    Equipment Recommendations   (patient to use parent's shower chair and 3 in 1)    Precautions / Restrictions Precautions Precautions: Back Precaution Comments: Reviewed back precautions. Restrictions Weight Bearing Restrictions: No     Mobility Bed Mobility General bed mobility comments: Pt sitting up in recliner.   Transfers Overall transfer level: Needs assistance Equipment used: Rolling walker (2 wheeled)   Sit to Stand: Supervision  General transfer comment: Pt with good hand placement and technique. Cues to adhere to back precautions during all mobility.    Balance Overall balance assessment: Modified Independent Sitting-balance support: Feet supported Sitting balance-Leahy Scale: Fair     Standing balance support: During functional activity;Single extremity supported;Bilateral upper extremity supported;No upper  extremity supported Standing balance-Leahy Scale: Fair     ADL   Eating/Feeding: Independent;Sitting   Grooming: Wash/dry hands;Wash/dry face;Oral care;Applying deodorant;Brushing hair;Supervision/safety (cues to adhere to back precautions)   Upper Body Bathing: Supervision/ safety;Standing (cues to adhere to back precautions)   Lower Body Bathing: Supervison/ safety;Cueing for back precautions;Sit to/from stand   Upper Body Dressing : Standing;Supervision/safety (cues to adhere to back precautions)   Lower Body Dressing: Cueing for back precautions;Supervision/safety;Sit to/from stand   Toilet Transfer: Supervision/safety;Ambulation;RW;Comfort height toilet  Tub/Shower Transfer Details (indicate cue type and reason): Patient ambulated with RW to tub and practiced safe tub transfer and on/off shower chair.  Patient able to return demonstrate with only 1 vc for twisting and able to instruct this OT to stabilize walker for transfer as though I was her mother  Functional mobility during ADLs: Rolling walker;Supervision/safety (cues to adhere to back precautions) General ADL Comments: Pt is mobilizing well, moving slowly and educated on incorporating precautions into ADLs. Patient required cues to adhere to back precautions-no family present to understand need to cue patient for back precautions.       Cognition   Behavior During Therapy: WFL for tasks assessed/performed Overall Cognitive Status: Within Functional Limits for tasks assessed     General Comments      Pertinent Vitals/ Pain       Pain Score: 4  Pain Location: back Pain Descriptors / Indicators: Sore Pain Intervention(s): Limited activity within patient's tolerance;Monitored during session;Premedicated before session;Repositioned;Relaxation   Progress Toward Goals  OT Goals(current goals can now be found in the care plan section)  Progress towards OT goals: Goals met/education completed, patient discharged from  OT  Acute Rehab OT Goals Patient Stated Goal: to  return to PLOF.  Plan All goals met and education completed, patient discharged from OT services    End of Session Equipment Utilized During Treatment: Rolling walker   Activity Tolerance Patient tolerated treatment well   Patient Left in chair;with chair alarm set;with call bell/phone within reach    Time: 5830-9407 OT Time Calculation (min): 61 min  Charges: OT General Charges $OT Visit: 1 Procedure OT Treatments $Self Care/Home Management : 53-67 mins  Jossilyn Benda 12/09/2013, 9:45 AM

## 2013-12-09 NOTE — Discharge Summary (Signed)
Physician Discharge Summary  Patient ID: Becky [name] MRN: [id number] DOB/AGE: 56-Aug-1957 56 y.o.  Admit date: 12/07/2013 Discharge date: 12/09/2013  Admission Diagnoses:right l5s1 stenosis  Discharge Diagnoses:  Active Problems:   Lumbar foraminal stenosis   Discharged Condition: no pain  Hospital Course: surgery  Consults: none  Significant Diagnostic Studies: myelogram  Treatments: right l5s1 decompression  Discharge Exam: Blood pressure 139/76, pulse 70, temperature 97.8 F (36.6 C), temperature source Oral, resp. rate 16, height 5' 0.25" (1.53 m), weight 71.351 kg (157 lb 4.8 oz), SpO2 96.00%. Ambulating, no pain  Disposition: home with parents     Medication List    ASK your doctor about these medications       albuterol (2.5 MG/3ML) 0.083% nebulizer solution  Commonly known as:  PROVENTIL  Take 3 mLs (2.5 mg total) by nebulization every 6 (six) hours as needed. DX:  493.00 FILE WITH MCR PART B     albuterol 108 (90 BASE) MCG/ACT inhaler  Commonly known as:  PROAIR HFA  INHALE 2 PUFFS INTO THE LUNGS EVERY 6 HOURS AS NEEDED     amLODipine 5 MG tablet  Commonly known as:  NORVASC  Take 5 mg by mouth daily.     benztropine 0.5 MG tablet  Commonly known as:  COGENTIN  Take 0.5 mg by mouth at bedtime.     buPROPion 150 MG 24 hr tablet  Commonly known as:  WELLBUTRIN XL  Take 3 tablets by mouth daily to = 450mg      cholecalciferol 1000 UNITS tablet  Commonly known as:  VITAMIN D  Take 3,000 Units by mouth daily.     clonazePAM 0.5 MG tablet  Commonly known as:  KLONOPIN  Take 0.5 mg by mouth 4 (four) times daily.     DULoxetine 30 MG capsule  Commonly known as:  CYMBALTA  Take 30 mg by mouth daily. Along with 120mg  in the morning     DULoxetine 60 MG capsule  Commonly known as:  CYMBALTA  Take 120 mg by mouth daily. Along with 30mg  in the morning     eszopiclone 2 MG Tabs tablet  Commonly known as:  LUNESTA  Take 2 mg by mouth at  bedtime. Take immediately before bedtime     fexofenadine 30 MG tablet  Commonly known as:  ALLEGRA  Take 30 mg by mouth 2 (two) times daily.     fluticasone 50 MCG/ACT nasal spray  Commonly known as:  FLONASE  Place 2 sprays into both nostrils daily.     gabapentin 100 MG capsule  Commonly known as:  NEURONTIN  Take 100 mg by mouth 2 (two) times daily.     hydrochlorothiazide 25 MG tablet  Commonly known as:  HYDRODIURIL  Take 25 mg by mouth daily.     ibuprofen 200 MG tablet  Commonly known as:  ADVIL,MOTRIN  Take 800 mg by mouth every 6 (six) hours as needed for mild pain or moderate pain.     losartan 50 MG tablet  Commonly known as:  COZAAR  Take 50 mg by mouth daily.     meclizine 25 MG tablet  Commonly known as:  ANTIVERT  Take 1 tablet by mouth as needed.     metFORMIN 500 MG tablet  Commonly known as:  GLUCOPHAGE  Take 500 mg by mouth daily after breakfast.     metoprolol 50 MG tablet  Commonly known as:  LOPRESSOR  Take 50 mg by mouth 2 (two) times daily.  mometasone-formoterol 100-5 MCG/ACT Aero  Commonly known as:  DULERA  Inhale 2 puffs into the lungs 2 (two) times daily.     omeprazole 20 MG capsule  Commonly known as:  PRILOSEC  Take 20 mg by mouth 2 (two) times daily.     promethazine 25 MG tablet  Commonly known as:  PHENERGAN  Take 25 mg by mouth every 6 (six) hours as needed.     traMADol 50 MG tablet  Commonly known as:  ULTRAM  Take 1-2 two times a day as needed for knee pain     traZODone 50 MG tablet  Commonly known as:  DESYREL  Take 50 mg by mouth at bedtime.     ziprasidone 40 MG capsule  Commonly known as:  GEODON  Take by mouth. Taking 40 at dinner     ziprasidone 80 MG capsule  Commonly known as:  GEODON  Take 80 mg by mouth at bedtime. Taking 80 mg at bedtime     ziprasidone 20 MG capsule  Commonly known as:  GEODON  Take 20 mg by mouth every morning.         Signed: Floyce Stakes 12/09/2013, 9:27 AM

## 2013-12-09 NOTE — Progress Notes (Signed)
Discharge instructions reviewed with patient/family. RX given to patient from Dr. Joya Salm. No other concerns voice at this time. Patient is waiting for next dose of pain medication and transportation from parents prior to leaving.  Ave Filter, RN

## 2013-12-09 NOTE — Progress Notes (Signed)
Physical Therapy Treatment Patient Details Name: Becky [name] MRN: [id number] DOB: 06-Aug-1955 Today's Date: 12/09/2013    History of Present Illness Becky [name] is a 58 y.o. Female s/p lumbar laminectomy/decompression 1 level on 12/07/13. PMH of DM, tremor, atrhitis, HTN, anxiety, peripheral neuropathy, and depression.     PT Comments    Patient progressing well with mobility. Tolerated increase in ambulation distance and negotiated steps with supervision for safety. Pt able to recall 3/3 back precautions without cues. Continues to exhibit slow, guarded gait pattern. Reviewed log roll technique for bed mobility. Will continue to progress as tolerated.   Follow Up Recommendations  Home health PT;Supervision/Assistance - 24 hour     Equipment Recommendations  None recommended by PT    Recommendations for Other Services       Precautions / Restrictions Precautions Precautions: Back Precaution Comments: Reviewed back precautions. Restrictions Weight Bearing Restrictions: No    Mobility  Bed Mobility               General bed mobility comments: Pt sitting up in recliner upon PT arrival.   Transfers Overall transfer level: Needs assistance Equipment used: Rolling walker (2 wheeled) Transfers: Sit to/from Stand Sit to Stand: Supervision         General transfer comment: Supervision for safety. Adhered to back precautions during transfers.  Ambulation/Gait Ambulation/Gait assistance: Supervision Ambulation Distance (Feet): 500 Feet Assistive device: Rolling walker (2 wheeled) Gait Pattern/deviations: Step-through pattern;Decreased stride length Gait velocity: .84 ft/sec Gait velocity interpretation: <1.8 ft/sec, indicative of risk for recurrent falls General Gait Details: Slow, steady gait. VC for RW proximity. No LOB.   Stairs Stairs: Yes Stairs assistance: Supervision Stair Management: Two rails;Step to pattern Number of Stairs: 3 (x2  bouts) General stair comments: Safe stair negotiation technique.  Wheelchair Mobility    Modified Rankin (Stroke Patients Only)       Balance Overall balance assessment: Modified Independent Sitting-balance support: Feet supported Sitting balance-Leahy Scale: Fair     Standing balance support: During functional activity Standing balance-Leahy Scale: Fair Standing balance comment: Able to stand for short periods w/out UE support however requires UE support for gait for balance/safety and pain management.                    Cognition Arousal/Alertness: Awake/alert Behavior During Therapy: WFL for tasks assessed/performed Overall Cognitive Status: Within Functional Limits for tasks assessed                      Exercises      General Comments General comments (skin integrity, edema, etc.): Reviewed back precautions and pt able to recall 3/3. Adhered throughout all mobility.       Pertinent Vitals/Pain Pain Assessment: 0-10 Pain Score: 3  Pain Location: back Pain Descriptors / Indicators: Sore Pain Intervention(s): Monitored during session;Repositioned    Home Living                      Prior Function            PT Goals (current goals can now be found in the care plan section) Acute Rehab PT Goals Patient Stated Goal: to return to PLOF. Progress towards PT goals: Progressing toward goals    Frequency  Min 5X/week    PT Plan Current plan remains appropriate    Co-evaluation             End of Session Equipment Utilized During Treatment: Gait  belt Activity Tolerance: Patient tolerated treatment well Patient left: in chair;with call bell/phone within reach;with chair alarm set     Time: 5909-3112 PT Time Calculation (min): 19 min  Charges:  $Gait Training: 8-22 mins                    G CodesCandy Sledge A 01-Jan-2014, 10:31 AM Candy Sledge, PT, DPT [phone]

## 2013-12-09 NOTE — Progress Notes (Signed)
CM following for DCP; No HHC ordered by attending MD; Aneta Mins [phone]

## 2013-12-27 ENCOUNTER — Encounter: Payer: Self-pay | Admitting: Critical Care Medicine

## 2013-12-27 ENCOUNTER — Ambulatory Visit (INDEPENDENT_AMBULATORY_CARE_PROVIDER_SITE_OTHER): Payer: Medicare Other | Admitting: Critical Care Medicine

## 2013-12-27 ENCOUNTER — Ambulatory Visit: Payer: Medicare Other | Admitting: Critical Care Medicine

## 2013-12-27 ENCOUNTER — Ambulatory Visit: Payer: Medicare Other | Admitting: Family Medicine

## 2013-12-27 VITALS — BP 118/80 | HR 81 | Temp 97.0°F | Ht 60.5 in

## 2013-12-27 DIAGNOSIS — J4540 Moderate persistent asthma, uncomplicated: Secondary | ICD-10-CM

## 2013-12-27 MED ORDER — BUDESONIDE-FORMOTEROL FUMARATE 160-4.5 MCG/ACT IN AERO: 2.0000 | INHALATION_SPRAY | Freq: Two times a day (BID) | RESPIRATORY_TRACT | Status: DC

## 2013-12-27 NOTE — Patient Instructions (Signed)
Stop dulera Start symbicort 160 two puff twice daily Reduce singulair to one daily Return  4 months

## 2013-12-27 NOTE — Progress Notes (Signed)
Subjective:    Patient ID: Becky [name], female    DOB: [birth year], 58 y.o.   MRN: [id number]  HPI 58 yo female with known hx of Asthma   12/27/2013 Chief Complaint  Patient presents with  . Follow-up    increased SOB when climbing stairs x 6 months. Cough with clear mucus.  No wheezing or chest tightness/pain.  Notes more dyspnea up incline and stairs.  Coughing clear mucus.   Pt allergic to dust/mold with allergy testing at [place] 12/27/2013 03/01/2013 09/09/2012 01/19/2012 08/14/2011 04/10/2011  Symptoms 0-2 days/week Daily >2 days/week 0-2 days/week >2 days/week Daily  Nighttime awakenings 0-2/month 0-2/month 0-2/month 0-2/month 0-2/month Often--7/wk  Interference with activity Some limitations Minor limitations No limitations No limitations Minor limitations Some limitations  SABA use > 2 days/wk--not > 1 x/day 0-2 days/wk 0-2 days/wk 0-2 days/wk 0-2 days/wk Daily  Exacerbations requiring oral steroids 0-1 / year 2 or more / year 0-1 / year 0-1 / year 0-1 / year 2 or more / year       Review of Systems Constitutional:   No  weight loss, night sweats,  Fevers, chills, fatigue, or  lassitude.  HEENT:   No headaches,  Tooth/dental problems, or  Sore throat,                No sneezing, itching, ear ache, + nasal congestion, post nasal drip,   CV:  No chest pain,  Orthopnea, PND, swelling in lower extremities, anasarca, dizziness, palpitations, syncope.   GI  No   abdominal pain, nausea, vomiting, diarrhea, change in bowel habits, loss of appetite, bloody stools.   Resp:  No chest wall deformity  Skin: no rash or lesions.  GU: no dysuria, change in color of urine, no urgency or frequency.  No flank pain, no hematuria   MS:  No joint pain or swelling.  No decreased range of motion.  No back pain.  Psych:  No change in mood or affect. No depression or anxiety.  No memory loss.     Objective:   Physical Exam  BP 118/80 mmHg  Pulse 81   Temp(Src) 97 F (36.1 C) (Oral)  Ht 5' 0.5" (1.537 m)  SpO2 95%  GEN: A/Ox3; pleasant , NAD  HEENT:  Linda/AT,  EACs-clear, TMs-wnl, NOSE-clear, THROAT-clear, no lesions, no postnasal drip or exudate noted.   NECK:  Supple w/ fair ROM; no JVD; normal carotid impulses w/o bruits; no thyromegaly or nodules palpated; no lymphadenopathy.  RESP  Decreased BS in bases, no stridor , barking cough  no accessory muscle use, no dullness to percussion   CARD:  RRR, no m/r/g  , no peripheral edema, pulses intact, no cyanosis or clubbing.  GI:   Soft & nt; nml bowel sounds; no organomegaly or masses detected.  Musco: Warm bil, no deformities or joint swelling noted.   Neuro: alert, no focal deficits noted.    Skin: Warm, no lesions or rashes     Assessment & Plan:   Severe persistent asthma with atopic and reflux precipitating factors Severe persistent asthma with significant atopic features and reflux Note this patient cannot take Dulera because of insurance reasons Plan Stop dulera Start symbicort 160 two puff twice daily Reduce singulair to one daily Return  4 months    Updated Medication List Outpatient Encounter Prescriptions as of 12/27/2013  Medication Sig  . albuterol (PROAIR HFA) 108 (90 BASE) MCG/ACT inhaler INHALE 2 PUFFS INTO THE LUNGS EVERY 6  HOURS AS NEEDED  . albuterol (PROVENTIL) (2.5 MG/3ML) 0.083% nebulizer solution Take 3 mLs (2.5 mg total) by nebulization every 6 (six) hours as needed. DX:  493.00 FILE WITH MCR PART B  . amLODipine (NORVASC) 5 MG tablet Take 5 mg by mouth daily.  . benztropine (COGENTIN) 0.5 MG tablet Take 0.5 mg by mouth at bedtime.  Marland Kitchen buPROPion (WELLBUTRIN XL) 150 MG 24 hr tablet Take 3 tablets by mouth daily to = 450mg   . cholecalciferol (VITAMIN D) 1000 UNITS tablet Take 3,000 Units by mouth daily.  . clonazePAM (KLONOPIN) 0.5 MG tablet Take 0.5 mg by mouth 4 (four) times daily.   . DULoxetine (CYMBALTA) 30 MG capsule Take 30 mg by mouth daily.  Along with 120mg  in the morning  . DULoxetine (CYMBALTA) 60 MG capsule Take 120 mg by mouth daily. Along with 30mg  in the morning  . eszopiclone (LUNESTA) 2 MG TABS Take 2 mg by mouth at bedtime. Take immediately before bedtime   . fexofenadine (ALLEGRA) 30 MG tablet Take 30 mg by mouth 2 (two) times daily.  . fluticasone (FLONASE) 50 MCG/ACT nasal spray Place 2 sprays into both nostrils daily.  Marland Kitchen gabapentin (NEURONTIN) 100 MG capsule Take 100 mg by mouth 2 (two) times daily.   . hydrochlorothiazide (HYDRODIURIL) 25 MG tablet Take 25 mg by mouth daily.  Marland Kitchen ibuprofen (ADVIL,MOTRIN) 200 MG tablet Take 800 mg by mouth every 6 (six) hours as needed for mild pain or moderate pain.  . meclizine (ANTIVERT) 25 MG tablet Take 1 tablet by mouth as needed.  . metFORMIN (GLUCOPHAGE) 500 MG tablet Take 500 mg by mouth at bedtime.   . metoprolol (LOPRESSOR) 50 MG tablet Take 50 mg by mouth 2 (two) times daily.  . montelukast (SINGULAIR) 10 MG tablet Take 1 tablet by mouth 2 (two) times daily.  Marland Kitchen omeprazole (PRILOSEC) 20 MG capsule Take 20 mg by mouth 2 (two) times daily.  . promethazine (PHENERGAN) 25 MG tablet Take 25 mg by mouth every 6 (six) hours as needed.  . traMADol (ULTRAM) 50 MG tablet Take 1-2 two times a day as needed for knee pain  . traZODone (DESYREL) 50 MG tablet Take 50 mg by mouth at bedtime.   . ziprasidone (GEODON) 20 MG capsule Take 20 mg by mouth every morning.  . ziprasidone (GEODON) 40 MG capsule Take by mouth. Taking 40 at dinner  . ziprasidone (GEODON) 80 MG capsule Take 80 mg by mouth at bedtime. Taking 80 mg at bedtime  . [DISCONTINUED] mometasone-formoterol (DULERA) 100-5 MCG/ACT AERO Inhale 2 puffs into the lungs 2 (two) times daily.  . budesonide-formoterol (SYMBICORT) 160-4.5 MCG/ACT inhaler Inhale 2 puffs into the lungs 2 (two) times daily.  . [DISCONTINUED] losartan (COZAAR) 50 MG tablet Take 50 mg by mouth daily.

## 2013-12-28 NOTE — Assessment & Plan Note (Signed)
Severe persistent asthma with significant atopic features and reflux Note this patient cannot take Dulera because of insurance reasons Plan Stop dulera Start symbicort 160 two puff twice daily Reduce singulair to one daily Return  4 months

## 2014-01-03 ENCOUNTER — Ambulatory Visit: Payer: Medicare Other | Admitting: Family Medicine

## 2014-01-10 ENCOUNTER — Ambulatory Visit: Payer: Medicare Other | Admitting: Critical Care Medicine

## 2014-01-24 ENCOUNTER — Encounter: Payer: Self-pay | Admitting: Family Medicine

## 2014-01-24 ENCOUNTER — Ambulatory Visit (INDEPENDENT_AMBULATORY_CARE_PROVIDER_SITE_OTHER): Payer: Medicare Other | Admitting: Family Medicine

## 2014-01-24 DIAGNOSIS — E119 Type 2 diabetes mellitus without complications: Secondary | ICD-10-CM

## 2014-01-24 DIAGNOSIS — F509 Eating disorder, unspecified: Secondary | ICD-10-CM

## 2014-01-24 NOTE — Patient Instructions (Signed)
-   Goal:  Mix up your protein sources to include more than cheese and peanut butter:  1/2 can of beans; soy burger, etc.; soy milk; (1/3 c powdered milk added to oatmeal); protein powders for snacks and occasional smoothies as meal replacements; nut butters; yogurt; tuna - at least once a week; eggs - at least once a week.    - Scoop 6-8 oz of yogurt into a bowl; add mashed banana and cinnamon, and mix.  For breakfast (more calories), add some nuts and berries.    - Another good use of plain yogurt is to add ~1/2 c to salads; especially good with black beans on salad (also great on black bean soup).    - Try mixing cottage cheese and fruit with your yogurt.   - Goal:  Include vegetables at both lunch and dinner.   - Goal: Walk the dog as tolerated:  To the park once a week and 15 min 4 X day.  - Monitor goals on Goals Sheet provided; email [name] at the end of each week to report progress.

## 2014-01-24 NOTE — Progress Notes (Signed)
Medical Nutrition Therapy:  Appt start time: 1030 end time:  1100.  Assessment:  Primary concerns today: Elevated blood glucose (pre-DM) levels and eating disorder. Myya had laminectomy surgery at L5 S1 on 11/28.  She is walking much better, but is still having pain when she sits for for longer than 5 minutes or lies down.  Plans to follow up with Dr. Nori Riis at [place] on January 8.      Caterin has often been getting only a sandwich for lunch, no veg's or fruit.  As usual, she has not had any appetite, but is consistently getting protein at each meal, often resorting to cheese or peanut butter.    24-hr recall:  (Up at 9 AM; fell asleep at 6:30 AM; worrying about her parents) B (9:30 AM)-  1 pkt inst flavored oatmeal, water Snk ( AM)-   L (1:30 PM)-  1/2 [place] wrap (feta chs, let, tom), water Snk ( PM)-   D (7 PM)-  1 baked pot, >1/2 c spinach, 1.5 oz chs, water Snk ( PM)-   Typical day? Yes.    Recent FBG have been running 98-150s; most often 115-130.  Higher than usual; probably stress-related from her own surgery as well as from worries related to her parents' failing health, including a number of recent ER visits for her dad.    Progress:  In Progress.    Nutritional Diagnosis:  Slight progress noted on NB-1.5 Disordered eating pattern as related to weight anxiety as evidenced by self-report of 3 meals on most days, although some meals are minimal.      Intervention:  Nutrition counseling.     Monitoring/Evaluation:  Dietary intake, body weight, and physical activity in 5 weeks.  No appts available sooner.

## 2014-02-10 ENCOUNTER — Other Ambulatory Visit: Payer: Self-pay | Admitting: Critical Care Medicine

## 2014-02-18 ENCOUNTER — Encounter: Payer: Self-pay | Admitting: Family Medicine

## 2014-02-18 ENCOUNTER — Ambulatory Visit (INDEPENDENT_AMBULATORY_CARE_PROVIDER_SITE_OTHER): Payer: Medicare Other | Admitting: Family Medicine

## 2014-02-18 VITALS — BP 109/68 | Ht 60.5 in | Wt 150.0 lb

## 2014-02-18 DIAGNOSIS — IMO0002 Reserved for concepts with insufficient information to code with codable children: Secondary | ICD-10-CM

## 2014-02-18 DIAGNOSIS — M25562 Pain in left knee: Secondary | ICD-10-CM

## 2014-02-18 DIAGNOSIS — M8580 Other specified disorders of bone density and structure, unspecified site: Secondary | ICD-10-CM

## 2014-02-18 DIAGNOSIS — M25561 Pain in right knee: Secondary | ICD-10-CM

## 2014-02-18 DIAGNOSIS — M5440 Lumbago with sciatica, unspecified side: Secondary | ICD-10-CM

## 2014-02-18 DIAGNOSIS — M1710 Unilateral primary osteoarthritis, unspecified knee: Secondary | ICD-10-CM

## 2014-02-18 DIAGNOSIS — M179 Osteoarthritis of knee, unspecified: Secondary | ICD-10-CM

## 2014-02-18 MED ORDER — METHYLPREDNISOLONE ACETATE 40 MG/ML IJ SUSP
40.0000 mg | Freq: Once | INTRAMUSCULAR | Status: AC
Start: 2014-02-18 — End: 2014-02-18
  Administered 2014-02-18: 40 mg via INTRA_ARTICULAR

## 2014-02-18 MED ORDER — METHYLPREDNISOLONE ACETATE 40 MG/ML IJ SUSP
40.0000 mg | Freq: Once | INTRAMUSCULAR | Status: AC
  Administered 2014-02-18: 40 mg via INTRA_ARTICULAR

## 2014-02-18 NOTE — Assessment & Plan Note (Signed)
B CSI today

## 2014-02-18 NOTE — Assessment & Plan Note (Addendum)
Had L5S1 right decompression surgical procedure with Dr Joya Salm. Has a lot of questions. I reviewed her MRI and her myelogram pictures with her. She was also told she had 'osteoporosis".Marland Kitchenibuprofen looked up her DEXXA scan and it said osteopenia. We discussed this briefly. She has already had some (IV? Type )  Treatment for that. Not clear what that is exactly and referred her back to rx dosctor for more discussion. We discussed all of these issues at length Greater than 50% of our 20 minute office visit was spent in counseling and education regarding these issues.

## 2014-02-18 NOTE — Progress Notes (Signed)
Patient ID: Becky [name], female   DOB: Jun 24, 1955, 59 y.o.   MRN: [id number]  Becky [name] - 59 y.o. female MRN [id number]  Date of birth: 10/21/1955    SUBJECTIVE:     1. B knee pain is worse over last 203 months. CSi typically helps. Would like injections today 2. Had a back surgery done by Dr Botero--(discectomy?)  For some radicular type low back pain. Had MRI and myelogram. Had 75% overall improvement initially but now having pain at night when she lies down. No pain standing or with activities. Pain 2-4/10 and occasionally keeps her from sleeping. Wants me to advise her next step ROS:     Pertinent review of systems: negative for fever or unusual weight change. See HPI  PERTINENT  PMH / [facility] / / SH:  Past Medical, Surgical, Social, and Family History Reviewed & Updated in the EMR.  Pertinent findings include:  Recent Right L5S1 decompression for stenosis. (March 2015) Severe persistent asthma Dissociative identity disorder Eating disorder DJD knee B Osteopenia (DEXXA 2015)   OBJECTIVE: BP 109/68 mmHg  Ht 5' 0.5" (1.537 m)  Wt 150 lb (68.04 kg)  BMI 28.80 kg/m2  Physical Exam:  Vital signs are reviewed. WD WN NAD. KNEES B FROM in extension and flexion. Crepitus on extension L>R Medial joint line tenderness B L>R Popliteal space is benign SKIN around knee area intact no rash or lesion or erythema or warmth NEURO  distally NV intact   INJECTION: Patient was given informed consent, signed copy in the chart. Appropriate time out was taken. Area prepped and draped in usual sterile fashion. 1 cc of methylprednisolone 40 mg/ml plus  4 cc of 1% lidocaine without epinephrine was injected into the Bilateral knees using a(n) anterior medial approach. The patient tolerated the procedure well. There were no complications. Post procedure instructions were given.   ASSESSMENT & PLAN:  See problem based charting & AVS for pt instructions.

## 2014-03-07 ENCOUNTER — Ambulatory Visit: Payer: Medicare Other | Admitting: Family Medicine

## 2014-03-14 ENCOUNTER — Ambulatory Visit (INDEPENDENT_AMBULATORY_CARE_PROVIDER_SITE_OTHER): Payer: Medicare Other | Admitting: Family Medicine

## 2014-03-14 ENCOUNTER — Encounter: Payer: Self-pay | Admitting: Family Medicine

## 2014-03-14 VITALS — Ht 60.25 in | Wt 148.4 lb

## 2014-03-14 DIAGNOSIS — E119 Type 2 diabetes mellitus without complications: Secondary | ICD-10-CM

## 2014-03-14 DIAGNOSIS — F509 Eating disorder, unspecified: Secondary | ICD-10-CM

## 2014-03-14 NOTE — Progress Notes (Signed)
Medical Nutrition Therapy:  Appt start time: 1030 end time:  1100.  Assessment:  Primary concerns today: Elevated blood glucose (pre-DM) levels and eating disorder (F50.9).     Becky [name]'s knee pain has decreased since the injection by Dr. Nori Riis last week.  She has not yet followed up with Dr. Joya Salm about the pain that's radiating down her leg from her back, but plans to do so today.  This leg pain is disrupting her sleep most nights of the week.    Becky [name] has been getting 3 meals a day since her last MNT appt, although she has not increased protein variety as much as recommended.  Normally she has a large amt of veg's for her dinner meal, usually broccoli.  She has been able to walk her dog 10 min 5-6 X day.    Becky [name]'s weight is down ~16 lb since August, although she is still uncertain if she has lost weight.    24-hr recall suggests intake of 1080 kcal and 54 g protein:  (Up at 7 AM) B (9 AM)-  1 pb sandwich on [employer] Healthy Nut bread*, water Snk ( AM)-   L (12:15 PM)-  1 pb sandwich, diet sweet tea Snk ( PM)-   D (6:15 PM)-  1 pb sandwich, water Snk ( PM)-   Typical day? No. Usually get veg's and fruits, which she was out of yesterday.  Plans to shop today.   *5 g protein per slice.  Progress:  In Progress.    Nutritional Diagnosis:  Slight progress noted on NB-1.5 Disordered eating pattern as related to weight anxiety as evidenced by self-report of 3 meals on most days, although some meals are minimal.      Intervention:  Nutrition counseling.     Monitoring/Evaluation:  Dietary intake, body weight, and physical activity in 4 weeks.

## 2014-03-14 NOTE — Patient Instructions (Signed)
-   On today's shopping trip:  Remember beans, yogurt, cheese, eggs, peanut butter, soy burgers, powdered milk (to add to oatmeal).  - Powdered milk:  1/3 c = 1 fluid cup (add this amt to one serving of oatmeal.) - Keep up the great job on your eating, with the following goals:  1. 3 meals a day and 1-2 snacks.    2. Protein with each meal, aiming for 55 grams/day.    3. Vegetables twice a day.   - Keep up your routine with the light box! - Email Edmonia Lynch a progress report weekly.

## 2014-04-04 ENCOUNTER — Ambulatory Visit: Payer: Medicare Other | Admitting: Family Medicine

## 2014-04-11 ENCOUNTER — Encounter: Payer: Self-pay | Admitting: Family Medicine

## 2014-04-11 ENCOUNTER — Ambulatory Visit (INDEPENDENT_AMBULATORY_CARE_PROVIDER_SITE_OTHER): Payer: Medicare Other | Admitting: Family Medicine

## 2014-04-11 VITALS — Ht 60.25 in | Wt 149.8 lb

## 2014-04-11 DIAGNOSIS — E119 Type 2 diabetes mellitus without complications: Secondary | ICD-10-CM

## 2014-04-11 DIAGNOSIS — F509 Eating disorder, unspecified: Secondary | ICD-10-CM

## 2014-04-11 NOTE — Patient Instructions (Addendum)
-   Attention to adequate protein and veg's: GOALS: - Protein with each meal. - Veg's with both lunch and dinner.   - Include at least one snack per day in addn to three meals.

## 2014-04-11 NOTE — Progress Notes (Signed)
Medical Nutrition Therapy:  Appt start time: 1030 end time:  1100.  Assessment:  Primary concerns today: Elevated blood glucose (pre-DM) levels and eating disorder (F50.9).     Phoebie continues to struggle with a chronic cough and difficulty breathing on exertion.  Her allergist is going to start her on injections for densitization to mold and mildew, which she has in her condo right now.  She is unsure of plans to get it cleaned up.   She will see Dr. Joya Salm for results of recent MRI this afternoon; she has continued to have back/hip pain.    24-hr recall suggests intake of ~720 kcal:  (Up at 5 AM) B (9 AM)-  1 pkt inst strawberry oatmeal, 1 c soy milk Snk ( AM)-   L (2 PM)-  1 pb sandwich, water Snk ( PM)-   D (7 PM)-  1 pb sandwich, water  Snk ( PM)-   Typical day? No.  Has usually been getting veg's 1-2 X daily; had vertigo yesterday, so didn't feel like eating much.    Progress:  In Progress.    Nutritional Diagnosis:  NB-1.5 Disordered eating pattern as related to weight anxiety as evidenced by less consistency in her meals (protein, veg's).      Intervention:  Nutrition counseling.     Monitoring/Evaluation:  Dietary intake, body weight, and physical activity in 4 weeks.

## 2014-05-09 ENCOUNTER — Ambulatory Visit (INDEPENDENT_AMBULATORY_CARE_PROVIDER_SITE_OTHER): Payer: Medicare Other | Admitting: Family Medicine

## 2014-05-09 ENCOUNTER — Encounter: Payer: Self-pay | Admitting: Family Medicine

## 2014-05-09 VITALS — Ht 60.25 in | Wt 152.4 lb

## 2014-05-09 DIAGNOSIS — F509 Eating disorder, unspecified: Secondary | ICD-10-CM

## 2014-05-09 DIAGNOSIS — E119 Type 2 diabetes mellitus without complications: Secondary | ICD-10-CM | POA: Diagnosis not present

## 2014-05-09 DIAGNOSIS — R7309 Other abnormal glucose: Secondary | ICD-10-CM | POA: Diagnosis not present

## 2014-05-09 NOTE — Patient Instructions (Addendum)
Goals remain the same: 1. Protein with each meal; aim for adequate amt at each meal, and more variety.    - cheese, eggs, beans, soyburgers, tuna, Greek yogurt.    - Aim for 55 g protein per day.   2. Vegetables with both lunch and dinner.   3. Eat at least 3 meals and 1-2 snacks per day.  Aim for no more than 5 hours between eating.  Eat breakfast within one hour of getting up.  4. Lean on your siblings as needed for emotional support.

## 2014-05-09 NOTE — Progress Notes (Signed)
Medical Nutrition Therapy:  Appt start time: 1030 end time:  1100.  Assessment:  Primary concerns today: Elevated blood glucose (pre-DM) levels (R73.09) and eating disorder (F50.9).     Becky [name] continues to have a dry cough and trouble breathing.  She sees pulmonologist Saralyn Pilar [facility] for her asthma and Dr. Tiajuana Amass for allergies, although Dr. Orvil Feil has started to treat Becky [name]'s asthma as well, which has caused some confusion about medications.  Becky [name] hopes to clarify today when she goes in to start on allergy shots for desensitization for mold and mildew.  She has a plan in place for dealing with the mold and mildew in her condo, as well.    Becky [name] MRI for her hip and back pain revealed compression at L4, so Dr Joya Salm referred her to Dr. Brien Few, who recommends doing injections of some sort.    Becky [name] has been agonizing over her father's behavior (related to his drinking and spending money), which impacts her eating behavior, as it contributes to her depression and motivation for self-care.  Becky [name] said she is doing "ok" with respect to meeting food goals, but she admits to not always getting much protein or variety of protein, and does not always get veg's at lunch/dinner.    24-hr recall suggests intake of ~1100 kcal:  (Up at 11 AM, after not having slept much the previous 2 nights) B (11 AM)-  1 pkt inst oatmeal, 1 c soy milk, 5 oz Greek yogurt  280 Snk ( AM)-  water L (2 PM)-  Egg, chs, spinach on thin bagel, water    410 Snk ( PM)-   D (7 PM)-  Egg, chs, spinach on thin bagel, water    410 Snk ( PM)-   Typical day? Yes.  But usually has fruit.    Progress:  In Progress.    Nutritional Diagnosis:  NB-1.5 Disordered eating pattern as related to weight anxiety as evidenced by less consistency in her meals (protein, veg's).      Intervention:  Nutrition counseling.     Monitoring/Evaluation:  Dietary intake, body weight, and physical activity in 4 weeks.

## 2014-05-23 ENCOUNTER — Telehealth: Payer: Self-pay | Admitting: Critical Care Medicine

## 2014-05-23 NOTE — Telephone Encounter (Signed)
Spoke with pt. States that she is not feeling well and would like an appointment with PW only. Saw her PCP on Saturday and was given an antibiotic and pred taper. Reports increased cough x1 week. Offered an appointment with TP but pt declined. OV has been scheduled with PW on 05/30/14 at 11am. Pt would like to see if antibiotic and pred taper help before appointment with PW. Advised her that if she gets worse before next Monday to please call our office.

## 2014-05-30 ENCOUNTER — Ambulatory Visit (INDEPENDENT_AMBULATORY_CARE_PROVIDER_SITE_OTHER): Payer: Medicare Other | Admitting: Critical Care Medicine

## 2014-05-30 ENCOUNTER — Encounter: Payer: Self-pay | Admitting: Critical Care Medicine

## 2014-05-30 VITALS — BP 112/82 | HR 66 | Temp 98.4°F | Ht 60.0 in | Wt 157.2 lb

## 2014-05-30 DIAGNOSIS — J4540 Moderate persistent asthma, uncomplicated: Secondary | ICD-10-CM

## 2014-05-30 DIAGNOSIS — R059 Cough, unspecified: Secondary | ICD-10-CM

## 2014-05-30 DIAGNOSIS — R05 Cough: Secondary | ICD-10-CM | POA: Diagnosis not present

## 2014-05-30 DIAGNOSIS — R490 Dysphonia: Secondary | ICD-10-CM | POA: Diagnosis not present

## 2014-05-30 MED ORDER — BENZONATATE 100 MG PO CAPS: ORAL_CAPSULE | ORAL | Status: DC

## 2014-05-30 MED ORDER — DEXTROMETHORPHAN POLISTIREX ER 30 MG/5ML PO SUER: ORAL | Status: DC

## 2014-05-30 MED ORDER — BUDESONIDE 180 MCG/ACT IN AEPB: 2.0000 | INHALATION_SPRAY | Freq: Two times a day (BID) | RESPIRATORY_TRACT | Status: DC

## 2014-05-30 NOTE — Assessment & Plan Note (Signed)
Mod persistent asthma with normal spirometry Cyclical cough upper airway instability Plan Stop prilosec Start Pulmicort two puff twice daily Stop Symbicort Stop allegra/fexofenidine Stay on Xyzal Stay on fluticasone nasal Cough protocol with Delsym/benzonatate Sinus and Neck CT will be obtained

## 2014-05-30 NOTE — Progress Notes (Signed)
Subjective:    Patient ID: Becky [name], female    DOB: August 15, 1955, 59 y.o.   MRN: [id number]  HPI 59 y.o.  female with known hx of Asthma   05/30/2014 Chief Complaint  Patient presents with  . Follow-up    Pt states she has an increased prod cough with yellow mucus. Hoarseness x 2 weeks. Pt has took prednisone and zpak and she feels like it has not helped.   Notes more cough x 2 weeks (worse over baseline)  Notes some yellow mucus.  Notes more hoarseness. Pt notes mild pndrip.  pred and zpak did not help . Pred 10mg  3/d x 3 and then down by 1 every 3 days till off.  No real wheezing. Notes DOE, notes chest tightness, notes dyspena and cough at night .  No sinus pressure. No indigestion or gerd symptoms   PUL ASTHMA HISTORY 05/30/2014 12/27/2013 03/01/2013 09/09/2012 01/19/2012 08/14/2011 04/10/2011  Symptoms Throughout the day 0-2 days/week Daily >2 days/week 0-2 days/week >2 days/week Daily  Nighttime awakenings Often--7/wk 0-2/month 0-2/month 0-2/month 0-2/month 0-2/month Often--7/wk  Interference with activity Some limitations Some limitations Minor limitations No limitations No limitations Minor limitations Some limitations  SABA use Several times/day > 2 days/wk--not > 1 x/day 0-2 days/wk 0-2 days/wk 0-2 days/wk 0-2 days/wk Daily  Exacerbations requiring oral steroids 2 or more / year 0-1 / year 2 or more / year 0-1 / year 0-1 / year 0-1 / year 2 or more / year   Review of Systems Constitutional:   No  weight loss, night sweats,  Fevers, chills, fatigue, or  lassitude.  HEENT:   No headaches,  Tooth/dental problems, or  Sore throat,                No sneezing, itching, ear ache, + nasal congestion, post nasal drip,   CV:  No chest pain,  Orthopnea, PND, swelling in lower extremities, anasarca, dizziness, palpitations, syncope.   GI  No   abdominal pain, nausea, vomiting, diarrhea, change in bowel habits, loss of appetite, bloody stools.   Resp:  No chest wall  deformity  Skin: no rash or lesions.  GU: no dysuria, change in color of urine, no urgency or frequency.  No flank pain, no hematuria   MS:  No joint pain or swelling.  No decreased range of motion.  No back pain.  Psych:  No change in mood or affect. No depression or anxiety.  No memory loss.     Objective:   Physical Exam  BP 112/82 mmHg  Pulse 66  Temp(Src) 98.4 F (36.9 C) (Oral)  Ht 5' (1.524 m)  Wt 157 lb 3.2 oz (71.305 kg)  BMI 30.70 kg/m2  SpO2 98%  GEN: A/Ox3; pleasant , NAD  HEENT:  [place]/AT,  EACs-clear, TMs-wnl, NOSE-clear, THROAT-clear, no lesions, no postnasal drip or exudate noted.   NECK:  Supple w/ fair ROM; no JVD; normal carotid impulses w/o bruits; no thyromegaly or nodules palpated; no lymphadenopathy.  RESP  Upper airway stridor, mod pseudowheeze  CARD:  RRR, no m/r/g  , no peripheral edema, pulses intact, no cyanosis or clubbing.  GI:   Soft & nt; nml bowel sounds; no organomegaly or masses detected.  Musco: Warm bil, no deformities or joint swelling noted.   Neuro: alert, no focal deficits noted.    Skin: Warm, no lesions or rashes  05/30/2014: spiro: NORMAL    Assessment & Plan:   Asthma, moderate persistent Mod persistent asthma with normal  spirometry Cyclical cough upper airway instability Plan Stop prilosec Start Pulmicort two puff twice daily Stop Symbicort Stop allegra/fexofenidine Stay on Xyzal Stay on fluticasone nasal Cough protocol with Delsym/benzonatate Sinus and Neck CT will be obtained       Updated Medication List Outpatient Encounter Prescriptions as of 05/30/2014  Medication Sig  . albuterol (PROAIR HFA) 108 (90 BASE) MCG/ACT inhaler INHALE 2 PUFFS INTO THE LUNGS EVERY 6 HOURS AS NEEDED  . albuterol (PROVENTIL) (2.5 MG/3ML) 0.083% nebulizer solution Take 3 mLs (2.5 mg total) by nebulization every 6 (six) hours as needed. DX:  493.00 FILE WITH MCR PART B  . amLODipine (NORVASC) 5 MG tablet Take 5 mg by mouth daily.   . benzonatate (TESSALON) 100 MG capsule 1-2 every 4 hours per cough protocol  . benztropine (COGENTIN) 0.5 MG tablet Take 0.5 mg by mouth 2 (two) times daily at 8 am and 10 pm.   . buPROPion (WELLBUTRIN XL) 150 MG 24 hr tablet Take 3 tablets by mouth daily to = 450mg   . cholecalciferol (VITAMIN D) 1000 UNITS tablet Take 5,000 Units by mouth daily.   . clonazePAM (KLONOPIN) 0.5 MG tablet Take 0.5 mg by mouth 4 (four) times daily.   . cycloSPORINE (RESTASIS) 0.05 % ophthalmic emulsion 1 drop 2 (two) times daily.  . DULoxetine (CYMBALTA) 30 MG capsule Take 30 mg by mouth daily. Along with 120mg  in the morning  . EPIPEN 2-PAK 0.3 MG/0.3ML SOAJ injection See admin instructions.  . eszopiclone (LUNESTA) 2 MG TABS Take 2 mg by mouth at bedtime. Take immediately before bedtime   . fluticasone (FLONASE) 50 MCG/ACT nasal spray Place 2 sprays into both nostrils daily.  Marland Kitchen gabapentin (NEURONTIN) 100 MG capsule Take 100 mg by mouth 2 (two) times daily.   . hydrochlorothiazide (HYDRODIURIL) 25 MG tablet Take 25 mg by mouth daily.  Marland Kitchen ibuprofen (ADVIL,MOTRIN) 200 MG tablet Take 800 mg by mouth every 6 (six) hours as needed for mild pain or moderate pain.  Marland Kitchen ipratropium (ATROVENT) 0.03 % nasal spray Place 2 sprays into both nostrils 2 (two) times daily.   Marland Kitchen levocetirizine (XYZAL) 5 MG tablet Take by mouth every evening.   . meclizine (ANTIVERT) 25 MG tablet Take 1 tablet by mouth as needed.  . metFORMIN (GLUCOPHAGE) 500 MG tablet Take 500 mg by mouth daily with breakfast.   . metoprolol (LOPRESSOR) 50 MG tablet Take 50 mg by mouth 2 (two) times daily.  . montelukast (SINGULAIR) 10 MG tablet Take 1 tablet by mouth 1 day or 1 dose.   . promethazine (PHENERGAN) 25 MG tablet Take 25 mg by mouth every 6 (six) hours as needed.  . traMADol (ULTRAM) 50 MG tablet Take 1-2 two times a day as needed for knee pain  . traZODone (DESYREL) 50 MG tablet Take 50 mg by mouth at bedtime.   . ziprasidone (GEODON) 20 MG capsule  Take 20 mg by mouth 2 (two) times daily before a meal.   . ziprasidone (GEODON) 80 MG capsule Take 60 mg by mouth at bedtime. Taking 80 mg at bedtime  . [DISCONTINUED] benzonatate (TESSALON) 100 MG capsule 3 (three) times daily.   . [DISCONTINUED] budesonide-formoterol (SYMBICORT) 160-4.5 MCG/ACT inhaler Inhale 2 puffs into the lungs 2 (two) times daily.  . [DISCONTINUED] fexofenadine (ALLEGRA) 180 MG tablet Take 180 mg by mouth daily.  . [DISCONTINUED] omeprazole (PRILOSEC) 20 MG capsule Take 20 mg by mouth daily.   . budesonide (PULMICORT FLEXHALER) 180 MCG/ACT inhaler Inhale 2 puffs into  the lungs 2 (two) times daily.  Marland Kitchen dextromethorphan (DELSYM) 30 MG/5ML liquid 5ML every 6 hours per cough protocol

## 2014-05-30 NOTE — Patient Instructions (Signed)
Stop prilosec Start Pulmicort two puff twice daily Stop Symbicort Stop allegra/fexofenidine Stay on Xyzal Stay on fluticasone nasal Cough protocol with Delsym/benzonatate Sinus and Neck CT will be obtained

## 2014-06-03 ENCOUNTER — Ambulatory Visit (INDEPENDENT_AMBULATORY_CARE_PROVIDER_SITE_OTHER)
Admission: RE | Admit: 2014-06-03 | Discharge: 2014-06-03 | Disposition: A | Discharge status: Home / Self Care | Payer: Medicare Other | Source: Ambulatory Visit | Attending: Critical Care Medicine | Admitting: Critical Care Medicine

## 2014-06-03 DIAGNOSIS — R059 Cough, unspecified: Secondary | ICD-10-CM

## 2014-06-03 DIAGNOSIS — R490 Dysphonia: Secondary | ICD-10-CM

## 2014-06-03 DIAGNOSIS — R05 Cough: Secondary | ICD-10-CM

## 2014-06-06 ENCOUNTER — Ambulatory Visit: Payer: Medicare Other | Admitting: Family Medicine

## 2014-06-06 ENCOUNTER — Encounter: Payer: Self-pay | Admitting: Family Medicine

## 2014-06-06 ENCOUNTER — Ambulatory Visit (INDEPENDENT_AMBULATORY_CARE_PROVIDER_SITE_OTHER): Payer: Medicare Other | Admitting: Family Medicine

## 2014-06-06 VITALS — Ht 60.25 in | Wt 154.9 lb

## 2014-06-06 DIAGNOSIS — E119 Type 2 diabetes mellitus without complications: Secondary | ICD-10-CM

## 2014-06-06 DIAGNOSIS — F509 Eating disorder, unspecified: Secondary | ICD-10-CM | POA: Diagnosis not present

## 2014-06-06 NOTE — Patient Instructions (Signed)
-   Listen to your body:    - If it's telling you to take a nap, then DO! (up no later than 3 PM.) - GOALS remain the same (adequate protein, eating at least 3 X day, and veg's 2 X day):  - Emphasize:  Veg's at lunch as well (or at least fruit, if no veg's) - Grocery shop; include good variety of protein foods as well as plenty of frozen veg's.    Your June appt will be on 6/20 at 11 AM.

## 2014-06-06 NOTE — Progress Notes (Signed)
Medical Nutrition Therapy:  Appt start time: 1100 end time:  1130.  Assessment:  Primary concerns today: Elevated blood glucose (pre-DM) levels (R73.09) and eating disorder (F50.9).     Becky [name] still has had no voice for about 3 wks now despite seeing pulmonologist Dr. Joya Gaskins 1 wk ago, so communication was challenging at today's appt, with her writing some responses.  Becky [name] feels her eating has been pretty good in the past month:  Getting 3 meals a day; including protein at each meal; getting at least 1 c of veg's usually just at dinner.  Not doing so well on variety of protein foods.    No regular exercise currently, but takes the dog out ~5 X day.  Energy levels are very low.  She has not been able to sleep well recently; wakes early and cannot get back to sleep, usually worrying about her parents' financial situation and her dad's alchololism.    24-hr recall suggests intake of ~680 kcal:  (Up at 3 AM) B (6:30 AM)-  1 peanut butter sandwich, water Snk ( AM)-  water L (2 PM)-  1 cheese sandwich, mustard, water Snk ( PM)-  1 apple, water D (7:15 PM)-  1 baked potato, mozzarella, chs, >1 c  Broccoli, water Snk ( PM)-  water Typical day? Yes.     Progress:  In Progress.    Nutritional Diagnosis:  NB-1.5 Disordered eating pattern as related to weight anxiety as evidenced by intake of <700 kcal yesterday.      Intervention:  Nutrition counseling.     Monitoring/Evaluation:  Dietary intake, body weight, and physical activity in 4 weeks.

## 2014-06-07 NOTE — Progress Notes (Signed)
Quick Note:  Notify the patient that the CT of the neck and sinuses are completely normal. No anatomic cause seen for her current symptoms. Stay on nasal sprays and other Rx as given last ov  ______

## 2014-06-08 ENCOUNTER — Telehealth: Payer: Self-pay | Admitting: Critical Care Medicine

## 2014-06-08 NOTE — Telephone Encounter (Signed)
Pt is requesting results from CT done on 06/03/14.  PW >> please advise. Thanks.

## 2014-06-08 NOTE — Progress Notes (Signed)
Quick Note:  Spoke with pt. Discussed CT results and recs per Dr. Joya Gaskins. She verbalized understanding. ______

## 2014-06-08 NOTE — Telephone Encounter (Signed)
Pt aware of CT results and recs.  Please see images tab under results for additional information.

## 2014-06-08 NOTE — Telephone Encounter (Signed)
i had crystal to call the pt already

## 2014-06-15 ENCOUNTER — Encounter: Payer: Medicare Other | Admitting: Adult Health

## 2014-06-15 ENCOUNTER — Ambulatory Visit (INDEPENDENT_AMBULATORY_CARE_PROVIDER_SITE_OTHER): Payer: Medicare Other | Admitting: Adult Health

## 2014-06-15 ENCOUNTER — Encounter: Payer: Self-pay | Admitting: Adult Health

## 2014-06-15 VITALS — BP 122/74 | HR 56 | Temp 98.0°F

## 2014-06-15 DIAGNOSIS — I10 Essential (primary) hypertension: Secondary | ICD-10-CM

## 2014-06-15 DIAGNOSIS — J4541 Moderate persistent asthma with (acute) exacerbation: Secondary | ICD-10-CM | POA: Diagnosis not present

## 2014-06-15 DIAGNOSIS — R05 Cough: Secondary | ICD-10-CM

## 2014-06-15 DIAGNOSIS — R053 Chronic cough: Secondary | ICD-10-CM | POA: Insufficient documentation

## 2014-06-15 HISTORY — DX: Essential (primary) hypertension: I10

## 2014-06-15 MED ORDER — NEBIVOLOL HCL 10 MG PO TABS: 10.0000 mg | ORAL_TABLET | Freq: Every day | ORAL | Status: DC

## 2014-06-15 NOTE — Patient Instructions (Addendum)
Stop Metoprolol .  Begin Bystolic 10mg  daily .  Continue on  Pulmicort two puff twice daily Continue on Xyzal Stay on fluticasone nasal Cough protocol with Delsym/benzonatate follow up Dr. Joya Gaskins  In  6 weeks and As needed   Please contact office for sooner follow up if symptoms do not improve or worsen or seek emergency care

## 2014-06-15 NOTE — Progress Notes (Signed)
   Subjective:    Patient ID: Becky [name], female    DOB: 04-Feb-1956, 59 y.o.   MRN: [id number]  HPI 59 yo female with known hx of Asthma   06/15/2014 Follow up : Asthma /cough  Patient returns for a two-week follow-up. Complains of persistent dry cough for months that causes sore throat, hoarseness, gagging, occasional phlegm in throat that is clear or yellow, dyspnea, head congestion w/ PND.  Congestion is mainly clear.  She was seen last visit and recommended to stop her Prilosec, Symbicort , and Allegra. She was started on Pulmicort and Xyzal .  Patient underwent a CT sinus and neck that was negative for acute process. She was referred to ENT. Patient was told that is possible that her metoprolol could be aggravating her cough.   Reports cough and hoarseness are unchanged since last ov.  Patient denies any chest pain, orthopnea, PND, hemoptysis.     Review of Systems Constitutional:   No  weight loss, night sweats,  Fevers, chills, fatigue, or  lassitude.  HEENT:   No headaches,  Tooth/dental problems, or  Sore throat,                No sneezing, itching, ear ache, + nasal congestion, post nasal drip,   CV:  No chest pain,  Orthopnea, PND, swelling in lower extremities, anasarca, dizziness, palpitations, syncope.   GI  No   abdominal pain, nausea, vomiting, diarrhea, change in bowel habits, loss of appetite, bloody stools.   Resp:  No chest wall deformity  Skin: no rash or lesions.  GU: no dysuria, change in color of urine, no urgency or frequency.  No flank pain, no hematuria   MS:  No joint pain or swelling.  No decreased range of motion.  No back pain.  Psych:  No change in mood or affect. No depression or anxiety.  No memory loss.         Objective:   Physical Exam GEN: A/Ox3; pleasant , NAD  HEENT:  [place]/AT,  EACs-clear, TMs-wnl, NOSE-clear, THROAT-clear, no lesions, no postnasal drip or exudate noted.   NECK:  Supple w/ fair ROM; no JVD; normal  carotid impulses w/o bruits; no thyromegaly or nodules palpated; no lymphadenopathy.  RESP  Decreased BS in bases, no stridor   no accessory muscle use, no dullness to percussion   CARD:  RRR, no m/r/g  , no peripheral edema, pulses intact, no cyanosis or clubbing.  GI:   Soft & nt; nml bowel sounds; no organomegaly or masses detected.  Musco: Warm bil, no deformities or joint swelling noted.   Neuro: alert, no focal deficits noted.    Skin: Warm, no lesions or rashes         Assessment & Plan:

## 2014-06-15 NOTE — Assessment & Plan Note (Signed)
Cyclical cough  Cont w/ trigger control  CT neck/sinus neg  Change non selective BB   Plan  Stop Metoprolol .  Begin Bystolic 10mg  daily .  Continue on  Pulmicort two puff twice daily Continue on Xyzal Stay on fluticasone nasal Cough protocol with Delsym/benzonatate follow up Dr. Joya Gaskins  In  6 weeks and As needed   Please contact office for sooner follow up if symptoms do not improve or worsen or seek emergency care

## 2014-06-15 NOTE — Assessment & Plan Note (Signed)
Trial off nonselective BB   Plan  Stop Metoprolol .  Begin Bystolic 10mg  daily .   follow up Dr. Joya Gaskins  In  6 weeks and As needed   Please contact office for sooner follow up if symptoms do not improve or worsen or seek emergency care

## 2014-06-15 NOTE — Addendum Note (Signed)
Addended by: Parke Poisson E on: 06/15/2014 05:07 PM   Modules accepted: Orders

## 2014-06-15 NOTE — Assessment & Plan Note (Signed)
Recurrent flare vs VCD vs Cyclical cough   Plan  Stop Metoprolol .  Begin Bystolic 10mg  daily .  Continue on  Pulmicort two puff twice daily Continue on Xyzal Stay on fluticasone nasal Cough protocol with Delsym/benzonatate follow up Dr. Joya Gaskins  In  6 weeks and As needed   Please contact office for sooner follow up if symptoms do not improve or worsen or seek emergency care

## 2014-06-20 ENCOUNTER — Encounter: Payer: Self-pay | Admitting: Pharmacist

## 2014-06-20 ENCOUNTER — Ambulatory Visit (INDEPENDENT_AMBULATORY_CARE_PROVIDER_SITE_OTHER): Payer: Medicare Other | Admitting: Pharmacist

## 2014-06-20 DIAGNOSIS — R053 Chronic cough: Secondary | ICD-10-CM

## 2014-06-20 DIAGNOSIS — R05 Cough: Secondary | ICD-10-CM

## 2014-06-20 NOTE — Patient Instructions (Signed)
Start nasal saline 2-4 sprays in each nostril 2-4 times per day.    Try to reduce use of Atrovent nasal spray: try 1 spray in one nostril in the morning, and 1 spray in the other nostril in the evening.   Try reducing olopatadine nasal spray by using only once a day.  Continue to work with your other physicians to discuss reducing the number of times you take your clonazepam, gabapentin, and metformin.    Please bring all of your medications during your next visit so we can sort through which ones you need to keep.

## 2014-06-20 NOTE — Progress Notes (Signed)
S: Patient arrives for medication review at request of Iver Nestle R.D.; however, she did not bring her medications with her at this visit.  Patient complains of dry, hacking cough. Reports recent discontinuation of metoprolol tartrate 50mg  BID, which was replaced with Bystolic 10mg  daily to help with cough. She also reports recent switch from Symbicort inhaler to Pulmicort. Pt reports trying to lower trazodone dose to 25mg , but increasing back to 50mg  due to difficulty staying asleep. She reports working with her physicians to decrease clonazepam dose.  She reports using olopatadine, fluticasone, and ipratropium nasal sprays BID, but that onset of cough was prior to starting nasal sprays. Pt displays interest in discontinuing trazodone and metformin. Pt has no other complaints, but admits to dry mouth. A: Fluticasone nasal spray may be contributing to dysphonia, while dryness from ipratropium & olopatadine nasal sprays may be exacerbating cough, with symptoms ongoing for >12 months.   P: Pt instructed to rinse & spit after using Pulmicort inhaler.  Start nasal saline 2-4 sprays in each nostril 2-4 times per day.    Try to reduce use of Atrovent nasal spray: try 1 spray in one nostril in the morning, and 1 spray in the other nostril in the evening.   Try reducing olopatadine nasal spray by using only once a day. Reassess symptom control & change in nasal symptoms at follow up in 6-8 wks.  Continue to work with your other physicians to discuss reducing the number of times you take your clonazepam, gabapentin, and metformin.

## 2014-06-20 NOTE — Progress Notes (Signed)
Patient ID: Becky [name], female   DOB: April 23, 1955, 59 y.o.   MRN: [id number] Reviewed: Agree with Dr. Graylin Shiver documentation and management.

## 2014-06-20 NOTE — Assessment & Plan Note (Addendum)
Fluticasone nasal spray may be contributing to dysphonia, while dryness from ipratropium & olopatadine nasal sprays may be exacerbating cough, with symptoms ongoing for >12 months.   P: Pt instructed to rinse & spit after using Pulmicort inhaler.  Start nasal saline 2-4 sprays in each nostril 2-4 times per day.    Try to reduce use of Atrovent nasal spray: try 1 spray in one nostril in the morning, and 1 spray in the other nostril in the evening.   Try reducing olopatadine nasal spray by using only once a day. Reassess symptom control & change in nasal symptoms at follow up in 6-8 wks.  Continue to work with your other physicians to discuss reducing the number of times you take your clonazepam, gabapentin, and metformin.

## 2014-06-23 ENCOUNTER — Telehealth: Payer: Self-pay | Admitting: Critical Care Medicine

## 2014-06-23 NOTE — Telephone Encounter (Signed)
Pt needed a referral.  This was taken care of. Nothing further needed at this time.

## 2014-07-04 ENCOUNTER — Ambulatory Visit (INDEPENDENT_AMBULATORY_CARE_PROVIDER_SITE_OTHER): Payer: Medicare Other | Admitting: Family Medicine

## 2014-07-04 ENCOUNTER — Encounter: Payer: Self-pay | Admitting: Family Medicine

## 2014-07-04 VITALS — Ht 60.25 in | Wt 155.8 lb

## 2014-07-04 DIAGNOSIS — E119 Type 2 diabetes mellitus without complications: Secondary | ICD-10-CM

## 2014-07-04 DIAGNOSIS — F509 Eating disorder, unspecified: Secondary | ICD-10-CM

## 2014-07-04 NOTE — Progress Notes (Signed)
Medical Nutrition Therapy:  Appt start time: 1100 end time:  1130.  Assessment:  Primary concerns today: Elevated blood glucose (pre-DM) levels (R73.09) and eating disorder (F50.9).     Becky [name] has an appt with Dr. Patrice Paradise, a larynx specialist at [facility] on June 7.  She still has severe laryngitis and cough, which has not improved for 2 months.  Sleep has been poor b/c of coughing, so she feels exhausted most days.  She has been unable to walk much b/c of fatigue.  Becky [name] has usually been meeting two of her dietary goals (adequate protein, eat 3 X day).  She still struggles to get veg's at mid-day.  Weight is relatively stable at 155.8 lb today.    24-hr recall suggests intake of 660 kcal:  (Up at 8 AM) B (9 AM)-  5 oz Grk fruit yogurt, water       80 Snk ( AM)-  --- L (2 PM)-  2 slc flatbread veg pizza, side salad, 2 tbsp ranch, water 350 Snk ( PM)-  --- D ( PM)-  Diet swt tea, 2 slc flatbread veg pizza, water   230 Snk ( PM)-  --- Typical day? No. b/c of eating out at lunch time.    Progress:  In Progress.    Nutritional Diagnosis:  NB-1.5 Disordered eating pattern as related to weight anxiety as evidenced by intake of <700 kcal yesterday.      Intervention:  Nutrition counseling.     Monitoring/Evaluation:  Dietary intake, body weight, and physical activity in 4 weeks.

## 2014-07-04 NOTE — Patient Instructions (Signed)
-   Emphasize getting veg's twice a day, instead of only at dinner:  - Quick lunch that includes veg's:  Microwave some frozen veg's; add beans and reheat.  ([employer] has the least expensive no-salt-added canned beans.)  - Other options:  Carrots or any other raw veg (pepper, cucumber, celery, tomatoes) with a sandwich or burrito.    - AND:  Salad or soup (if using canned, first microwave veg's, add soup, and re- heat).  - Keep up your efforts to get enough protein (and variety) and to eat at least 3 X day (4 or 5 may be even better.)

## 2014-07-14 ENCOUNTER — Ambulatory Visit: Payer: Medicare Other | Admitting: Critical Care Medicine

## 2014-08-01 ENCOUNTER — Ambulatory Visit (INDEPENDENT_AMBULATORY_CARE_PROVIDER_SITE_OTHER): Payer: Medicare Other | Admitting: Family Medicine

## 2014-08-01 ENCOUNTER — Encounter: Payer: Self-pay | Admitting: Family Medicine

## 2014-08-01 VITALS — Ht 60.25 in | Wt 156.7 lb

## 2014-08-01 DIAGNOSIS — F509 Eating disorder, unspecified: Secondary | ICD-10-CM | POA: Diagnosis not present

## 2014-08-01 DIAGNOSIS — E119 Type 2 diabetes mellitus without complications: Secondary | ICD-10-CM

## 2014-08-01 NOTE — Progress Notes (Signed)
Medical Nutrition Therapy:  Appt start time: 1100 end time:  1130.  Assessment:  Primary concerns today: Elevated blood glucose (pre-DM) levels (R73.09) and eating disorder (F50.9).     Daris had an evaluation for her throat by a specialist over in [place] a couple weeks ago.  He recommended discontinuing her antihistamines and at least one of her nasal sprays.  In addn, she has started voice therapy weekly, where she is learning to breathe differently.  Already her voice has started to sound much better, not like the laryngitis she's had for almost 3 mo.  Coughing is much improved, and her throat feels better.  Still feeling fatigued and severely depressed, however.  Also feels that her temper is shorter, which is very unusual for Haruye, who "never gets mad."  She has been doing pretty well with food; getting veg's 2 X day.    24-hr recall suggests intake of ~1000 kcal:  (Up at 8 AM) B (8:30 AM)-  1 pb sandwich, water Snk ( AM)-   water L (3 PM)-  1 1/2 c carrots, 1 pb sandwich, 1 fig newton, water Snk (4:30)-  water 4 PM - Got upset with brother b/c he yelled at her mother.  D (6:30 PM)-  1 c rice, 2 spring rolls, 1 c mixed veg's, blk tea Snk (7:30)-  1 c vanilla Grk yogurt Typical day? No.   Progress:  In Progress.    Nutritional Diagnosis:  NB-1.5 Disordered eating pattern as related to weight anxiety as evidenced by intake of ~1000 kcal yesterday.      Intervention:  Nutrition counseling.     Monitoring/Evaluation:  Dietary intake, body weight, and physical activity in 4 weeks.

## 2014-08-01 NOTE — Patient Instructions (Signed)
-   When you next see Remo Lipps, talk to her about our discussion today about anger and feelings, and learning to regulate feelings such as anger.   - Food goals:  1. 3 meals and at least one snack per day.   2. Veg's at both lunch and dinner.   3. Aim for 55 grams of protein daily; some protein at each meal.    - Consider journaling about your feelings:  Why you feel that way; what that feeling is prompting you to do.

## 2014-08-08 ENCOUNTER — Ambulatory Visit (INDEPENDENT_AMBULATORY_CARE_PROVIDER_SITE_OTHER): Payer: Medicare Other | Admitting: Pharmacist

## 2014-08-08 ENCOUNTER — Encounter: Payer: Self-pay | Admitting: Pharmacist

## 2014-08-08 ENCOUNTER — Other Ambulatory Visit: Payer: Self-pay

## 2014-08-08 DIAGNOSIS — R053 Chronic cough: Secondary | ICD-10-CM

## 2014-08-08 DIAGNOSIS — R05 Cough: Secondary | ICD-10-CM | POA: Diagnosis not present

## 2014-08-08 NOTE — Patient Instructions (Signed)
Thank you for coming in today!  Try to cut back on nasal sprays if you are getting too dry.  Follow up again in pharmacy clinic in 6 months.

## 2014-08-08 NOTE — Progress Notes (Signed)
Patient ID: Becky [name], female   DOB: October 24, 1955, 59 y.o.   MRN: [id number] Reviewed: Agree with Dr. Graylin Shiver documentation and management.

## 2014-08-08 NOTE — Progress Notes (Signed)
S: Patient presents for medication review. She presents with 3 boxes and 1 bag of medications.  She is willing to give medication up for destruction.   We reviewed and noted all medications in her possession.   In addition to medications on med list patient has: hydrocodone-acetaminophen prn. Has #65 tablets of 10-325 mg; #25 tablets of 5-325 mg. vitamin C 500 mg prn cough docusate prn bacitracin prn abrasion neomycin/polymixin/hydrocortisone otic suspension prn ear infection  Patient reports recently coming off of antihistamines and atrovent nasal spray and reports her cough and dry nose is much better. She reports trying to back off on gabapentin to 1 tablet per day but had to go back up to 1 tablet twice daily. She also reports trying to back down on clonazepam to 3 tabs per day but reports she had suicidal thoughts and went back up to 4 tabs per day.   A/P: Patient has no new complaints and brought in 3 boxes of medications for destruction. Cough and dry nose much improved since previous visit.  Recommended cutting down on nasal sprays if she feels she is getting too dry over the next 6-8 weeks. Patient appeared relieved at "donating" her old medication overstock/expired supply.   She left with one bag of her medication completely organized. Offered follow up for additional medication review in 6 months or PRN.  Follow up appointment with Iver Nestle on 08/29/14. Total time in face-to-face counseling 45 minutes. Patient seen with Nilsa Nutting, PharmD Candidate.

## 2014-08-08 NOTE — Assessment & Plan Note (Signed)
Patient has no new complaints and brought in 3 boxes of medications for destruction. Cough and dry nose much improved since previous visit.  Recommended cutting down on nasal sprays if she feels she is getting too dry over the next 6-8 weeks. Patient appeared relieved at "donating" her old medication overstock/expired supply.   She left with one bag of her medication completely organized. Offered follow up for additional medication review in 6 months or PRN.

## 2014-08-09 ENCOUNTER — Ambulatory Visit: Payer: Medicare Other | Admitting: Pharmacist

## 2014-08-29 ENCOUNTER — Encounter: Payer: Self-pay | Admitting: Family Medicine

## 2014-08-29 ENCOUNTER — Ambulatory Visit (INDEPENDENT_AMBULATORY_CARE_PROVIDER_SITE_OTHER): Payer: Medicare Other | Admitting: Family Medicine

## 2014-08-29 VITALS — Ht 60.25 in | Wt 159.4 lb

## 2014-08-29 DIAGNOSIS — E119 Type 2 diabetes mellitus without complications: Secondary | ICD-10-CM

## 2014-08-29 DIAGNOSIS — R7309 Other abnormal glucose: Secondary | ICD-10-CM | POA: Diagnosis not present

## 2014-08-29 DIAGNOSIS — F5000 Anorexia nervosa, unspecified: Secondary | ICD-10-CM

## 2014-08-29 NOTE — Progress Notes (Signed)
Medical Nutrition Therapy:  Appt start time: 1100 end time:  1130. Therapist: Sherilyn Cooter Psychiatrist: [name], MD  Assessment:  Primary concerns today: Elevated blood glucose (pre-DM) levels (R73.09) and eating disorder (F50.9).     Becky [name] felt she's been meeting her nutrition goals pretty well, although had veg's at lunch only ~half the time.  Estimates she is getting ~55-60 g protein per day.     Becky [name] did not do any journaling re. feelings, as discussed at last appt, but she did talk to her therapist Becky [name] about this, who encouraged her to follow up with this recommended activity.    24-hr recall:  (Up at 7 AM; had been awake since 4 AM) B (7 AM)-  5 oz Grk yogurt, pb sandwich, water Snk ( AM)-  water L (2 PM)-  1 Boca burger, 2 slc bread, salad, water Snk ( PM)-  water D (7 PM)-  1 bk'd potato, 3 oz cheese, 1(+) c broccoli, water Snk ( PM)-  water Typical day? Yes.     Progress:  In Progress.    Nutritional Diagnosis:  NB-1.5 Disordered eating pattern as related to weight anxiety as evidenced by continued denial of hunger signals and expressed disinterest in food.      Intervention:  Nutrition counseling.     Monitoring/Evaluation:  Dietary intake, body weight, and physical activity in 8 weeks.

## 2014-08-29 NOTE — Patient Instructions (Signed)
-   Food goals remain the same: 1. 3 meals and at least one snack per day.  2. Veg's at both lunch and dinner.  3. Aim for 55 grams of protein daily; some protein at each meal.  - Emphasis on #2 above.    - Get the book Eating in the Light of the [place] by Romie Minus, and read with a highlighter in hand.  - Journal about your feelings: Why you feel that way or what feeling are you trying to avoid; what that feeling/avoidance of that feeling is prompting you to do.

## 2014-10-18 ENCOUNTER — Ambulatory Visit (INDEPENDENT_AMBULATORY_CARE_PROVIDER_SITE_OTHER): Payer: Medicare Other | Admitting: Family Medicine

## 2014-10-18 ENCOUNTER — Encounter: Payer: Self-pay | Admitting: Family Medicine

## 2014-10-18 VITALS — Ht 60.25 in | Wt 155.7 lb

## 2014-10-18 DIAGNOSIS — F509 Eating disorder, unspecified: Secondary | ICD-10-CM | POA: Diagnosis not present

## 2014-10-18 DIAGNOSIS — R7309 Other abnormal glucose: Secondary | ICD-10-CM

## 2014-10-18 NOTE — Patient Instructions (Addendum)
-   Plan gym days and times for the week (starting out with a goal of 5 days a week).  Aim for consistency day-to-day.   - Journal about your feelings, as recommended by Opal Sidles.   - Food goals remain the same: 1. 3 meals and at least one snack per day.  2. Veg's at both lunch and dinner.  3. Aim for 55 grams of protein daily; some protein at each meal.

## 2014-10-18 NOTE — Progress Notes (Signed)
Medical Nutrition Therapy:  Appt start time: 1100 end time:  1130. Therapist: Sherilyn Cooter Psychiatrist: [name], MD  Assessment:  Primary concerns today: Elevated blood glucose (pre-DM) levels (R73.09) and eating disorder (F50.9).     Cheryln continues to have significant stress related to her father's drinking and the choices he has been making (especially regarding spending money).  She said her eating has "not been great" partly b/c she feels that taking care of others (including her parents and sister-in-law) has taken precedence over her own self-care.  She has been having problems with dissociating several times a week, and had a couple episodes of suicidal ideation in the past few weeks, for which she has been in touch with both her therapist and her psychiatrist.    Ieesha just joined the [employer], along with her ex-husband.  So far, she has gone only when she meets her ex-husband there; waits for his call to say he is going.  She has ridden the recumbent bike ~45 min and 20 min on the TM.    She has not yet bought Eating in the Light of the [place], and she has not started journaling yet, although this was recommended by Giada's psychiatrist [name], especially as a means to better understanding what has been triggering her dissociating.    Progress:  In Progress.    Nutritional Diagnosis:  NB-1.5 Disordered eating pattern as related to weight anxiety as evidenced by continued denial of hunger signals and expressed disinterest in food.      Intervention:  Nutrition counseling.     Monitoring/Evaluation:  Dietary intake, body weight, and physical activity in 4 weeks.

## 2014-10-27 ENCOUNTER — Other Ambulatory Visit: Payer: Self-pay | Admitting: Adult Health

## 2014-10-27 MED ORDER — NEBIVOLOL HCL 10 MG PO TABS
10.0000 mg | ORAL_TABLET | Freq: Every day | ORAL | Status: DC
Start: 2014-10-27 — End: 2015-03-06

## 2014-10-27 NOTE — Telephone Encounter (Signed)
Received paper refill for  Bystolic 10mg  1 tab daily Quantity 90  Med refilled per ov note to [facility], [address], [place] Nothing further needed

## 2014-11-18 ENCOUNTER — Encounter: Payer: Self-pay | Admitting: Family Medicine

## 2014-11-18 ENCOUNTER — Ambulatory Visit (INDEPENDENT_AMBULATORY_CARE_PROVIDER_SITE_OTHER): Payer: Medicare Other | Admitting: Family Medicine

## 2014-11-18 VITALS — BP 143/77 | HR 63 | Ht 61.0 in | Wt 150.0 lb

## 2014-11-18 DIAGNOSIS — M48061 Spinal stenosis, lumbar region without neurogenic claudication: Secondary | ICD-10-CM

## 2014-11-18 DIAGNOSIS — M4806 Spinal stenosis, lumbar region: Secondary | ICD-10-CM

## 2014-11-18 DIAGNOSIS — M25561 Pain in right knee: Secondary | ICD-10-CM | POA: Diagnosis not present

## 2014-11-18 DIAGNOSIS — G8929 Other chronic pain: Secondary | ICD-10-CM

## 2014-11-18 DIAGNOSIS — M25551 Pain in right hip: Secondary | ICD-10-CM

## 2014-11-18 MED ORDER — METHYLPREDNISOLONE ACETATE 40 MG/ML IJ SUSP
40.0000 mg | Freq: Once | INTRAMUSCULAR | Status: AC
  Administered 2014-11-18: 40 mg via INTRA_ARTICULAR

## 2014-11-19 DIAGNOSIS — M25551 Pain in right hip: Secondary | ICD-10-CM | POA: Insufficient documentation

## 2014-11-19 DIAGNOSIS — M25561 Pain in right knee: Secondary | ICD-10-CM | POA: Insufficient documentation

## 2014-11-19 NOTE — Assessment & Plan Note (Signed)
Previously has done well with steroid injections in bilateral knees.  We do not have any updated films so I will set those up.  Steroid injection into the right knee today.  Follow-up 1 month.

## 2014-11-19 NOTE — Progress Notes (Signed)
   Subjective:    Patient ID: Becky [name], female    DOB: 04-09-1955, 59 y.o.   MRN: [id number]  HPI  Complaining of right hip and right knee pain.  The pain is similar to previous episodes.  It has been greater than 6 months since she had any type of injection therapy.  Left knee is currently not bothering her.  Right hip pain is new, bothersome if she stands for a long period time or attempts to climb stairs.  She is having some radiation to the groin.  Most of the pain is on the lateral portion of the hip.  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  Review of Systems Pertinent review of systems: negative for fever or unusual weight change. No large family swelling.  No leg weakness.  Continues to have some chronic bilateral low and thoracic back pain unchanged since surgical procedure.    Objective:   Physical Exam  Vital signs are reviewed GENERAL: Well-developed female no acute distress.  Hips: Bilaterally symmetrical.  Right hip tender to palpation of the greater trochanteric area and this reproduces some for pain internal/external rotation is full but she has some pain with external rotation.  Hip flexor strength is normal bilaterally. KNEES: Symmetrical.  No sign of effusion, no erythema and no warmth.  Popliteal space is benign.  Soft.  Tender to palpation right medial joint line.  IMAGING: X-ray knee 2011 shows medial joint space narrowing.   INJECTION: Patient was given informed consent, signed copy in the chart. Appropriate time out was taken. Area prepped and draped in usual sterile fashion. 1 cc of methylprednisolone 40 mg/ml plus  4 cc of 1% lidocaine without epinephrine was injected into the Right knee using a(n) Anterior medial approach. The patient tolerated the procedure well. There were no complications. Post procedure instructions were given.  INJECTION: Patient was given informed consent, signed  copy in the chart. Appropriate time out was taken. Area prepped and draped in usual sterile fashion. 1 cc of methylprednisolone 40 mg/ml plus  5 cc of 1% lidocaine without epinephrine was injected into the Right greater trochanteric bursa using a(n) Perpendicular approach. The patient tolerated the procedure well. There were no complications. Post procedure instructions were given.      Assessment & Plan:  Discussion regarding her back surgery.  She still having a lot of back pain.  She evidently had MRI done knowing ever reviewed her films with her.  I reviewed today the CT scan she had and the op note.  She would like to get the MRI disc, drop it by review it together.  If it will be appropriately can review her upcoming hip and knee films as well.

## 2014-11-19 NOTE — Assessment & Plan Note (Signed)
Clearly she has some component of greater trochanteric bursitis so we if her corticosteroid injection today for that.  Given her symptoms of groin pain, I think she may have some degenerative changes as well we will get hip films and follow that up in about a month.

## 2014-11-22 ENCOUNTER — Ambulatory Visit: Payer: Medicare Other | Admitting: Family Medicine

## 2014-12-05 ENCOUNTER — Ambulatory Visit: Payer: Medicare Other | Admitting: Family Medicine

## 2014-12-20 ENCOUNTER — Encounter: Payer: Self-pay | Admitting: Family Medicine

## 2014-12-20 ENCOUNTER — Ambulatory Visit (INDEPENDENT_AMBULATORY_CARE_PROVIDER_SITE_OTHER): Payer: Medicare Other | Admitting: Family Medicine

## 2014-12-20 VITALS — Ht 60.25 in | Wt 154.2 lb

## 2014-12-20 DIAGNOSIS — F509 Eating disorder, unspecified: Secondary | ICD-10-CM

## 2014-12-20 DIAGNOSIS — E119 Type 2 diabetes mellitus without complications: Secondary | ICD-10-CM | POA: Diagnosis not present

## 2014-12-20 NOTE — Progress Notes (Signed)
Medical Nutrition Therapy:  Appt start time: 1100 end time:  1130. Therapist: Sherilyn Cooter Psychiatrist: [name], MD  Assessment:  Primary concerns today: Elevated blood glucose (pre-DM) levels (R73.09) and eating disorder (F50.9).     Jream said she is not doing well.  She is very distressed that her weight is not decreasing enough so she can see it.  She has not been exercising b/c of knee and back pain.  When her ex-husband gets back in town tomorrow, she plans to get back to they gym, however, and do whatever exercises she can do.    Giannie is feeling much stress related to both family dynamics and family financial problems.  For her, this translates into not wanting to eat, as well as not wanting to spend money on food.    24-hr recall suggests intake of <1000 kcal:  B ( AM)-  1 pimento chs sandw, water Snk ( AM)-  Black tea L ( PM)-  1 pb sandw, water Snk ( PM)-  Black tea D ( PM)-  1 Swiss cheese sandw, water Snk ( PM)-  --- Typical day? Yes.       Progress:  In Progress.    Nutritional Diagnosis:  NB-1.5 Disordered eating pattern as related to weight anxiety as evidenced by continued denial of hunger signals and expressed disinterest in food.      Intervention:  Nutrition counseling.     Monitoring/Evaluation:  Dietary intake, body weight, and physical activity in 4 weeks.

## 2014-12-20 NOTE — Patient Instructions (Signed)
-   Food goals remain the same:  1. 3 meals and at least one snack per day.   2. Veg's at both lunch and dinner.   3. Aim for 55 grams of protein daily; some protein at each meal.  - [email or web address]:  Get the app, and link to [name]'s account.    - Record intake as desired, but do make use of the messaging to stay in touch:  Let me know at least weekly how your are doing with respect to goals.    - Read some of eating in the Light of the [place], and JOURNAL!

## 2014-12-26 ENCOUNTER — Ambulatory Visit
Admission: RE | Admit: 2014-12-26 | Discharge: 2014-12-26 | Disposition: A | Discharge status: Home / Self Care | Payer: Medicare Other | Source: Ambulatory Visit | Attending: Family Medicine | Admitting: Family Medicine

## 2014-12-26 DIAGNOSIS — M25561 Pain in right knee: Principal | ICD-10-CM

## 2014-12-26 DIAGNOSIS — G8929 Other chronic pain: Secondary | ICD-10-CM

## 2014-12-26 DIAGNOSIS — M25551 Pain in right hip: Secondary | ICD-10-CM

## 2015-01-02 ENCOUNTER — Encounter: Payer: Self-pay | Admitting: Family Medicine

## 2015-01-17 ENCOUNTER — Ambulatory Visit (INDEPENDENT_AMBULATORY_CARE_PROVIDER_SITE_OTHER): Payer: Medicare Other | Admitting: Family Medicine

## 2015-01-17 VITALS — Ht 60.25 in | Wt 155.0 lb

## 2015-01-17 DIAGNOSIS — F509 Eating disorder, unspecified: Secondary | ICD-10-CM | POA: Diagnosis not present

## 2015-01-17 DIAGNOSIS — E089 Diabetes mellitus due to underlying condition without complications: Secondary | ICD-10-CM

## 2015-01-17 NOTE — Patient Instructions (Addendum)
-   Call Dr Amedeo Plenty' office today, and ask:  1. When is your appt for endo and colonoscopy?  2. Blood tests for iron are complete; can you start supplementing now? Food goals: 1. 3 meals and at least one snack per day.  2. Veg's at both lunch and dinner.   Keep frozen veg's on hand as well as carrots.   3. Aim for 55 grams of protein daily; some protein at each meal.  - VARY your protein sources:  Tuna, egg, beans, yogurt, peanut butter.    - Continue to record intake in [email or web address]   - Do your best to get better sleep.

## 2015-01-17 NOTE — Progress Notes (Signed)
Medical Nutrition Therapy:  Appt start time: 1100 end time:  1130. Therapist: Sherilyn Cooter Psychiatrist: [name], MD  Assessment:  Primary concerns today: Elevated blood glucose (pre-DM) levels (R73.09) and eating disorder (F50.9).     Becky [name] has had a very stressful time recently; her dad had surgery related to a foot infection on Nov 25; has been in the nursing home since Nov 30.    Becky [name] has been recording intake daily on [email or web address].  Intake looks pretty similar to yesterday's meals; very repetitive, but usually getting something resembling a meal 3 X day.  There has been no time for exercise; Becky [name] has been spending a lot of time at the nursing home, as well as driving her mother to visit or to do errands.    12/27/14 labs indicate slightly low Hgb (10.7), Hct (32.4), and %sat (9% vs normal of at least 20).  Becky [name] is delaying taking Fe supplement until she hears from her GI dr. Donivan Scull. previous recommendation to not supplement until after endo- and colonoscopy, which have not yet been scheduled.    24-hr recall suggests intake of ~910 kcal:  (Up at  AM) B (10:40 AM)-  Peanut butter and honey sandwich, water  360 Snk ( AM)-  --- L ( PM)-  1/2 [place] wrap (feta chs), diet swt tea  200 Snk ( PM)-  --- D ( PM)-  1 potato with broccoli and cheese, diet swt tea 350 Snk ( PM)-  --- Typical day? Yes.     Progress:  In Progress.    Nutritional Diagnosis:  NB-1.5 Disordered eating pattern as related to weight anxiety as evidenced by continued denial of hunger signals and expressed disinterest in food.      Intervention:  Nutrition counseling.     Monitoring/Evaluation:  Dietary intake, body weight, and physical activity in 4 weeks.

## 2015-02-14 ENCOUNTER — Ambulatory Visit: Payer: Medicare Other | Admitting: Family Medicine

## 2015-02-27 ENCOUNTER — Encounter: Payer: Self-pay | Admitting: Family Medicine

## 2015-02-27 ENCOUNTER — Ambulatory Visit (INDEPENDENT_AMBULATORY_CARE_PROVIDER_SITE_OTHER): Payer: Medicare Other | Admitting: Family Medicine

## 2015-02-27 VITALS — Ht 60.25 in | Wt 144.9 lb

## 2015-02-27 DIAGNOSIS — E46 Unspecified protein-calorie malnutrition: Secondary | ICD-10-CM | POA: Diagnosis not present

## 2015-02-27 DIAGNOSIS — E089 Diabetes mellitus due to underlying condition without complications: Secondary | ICD-10-CM | POA: Diagnosis not present

## 2015-02-27 DIAGNOSIS — F509 Eating disorder, unspecified: Secondary | ICD-10-CM

## 2015-02-27 NOTE — Progress Notes (Signed)
Medical Nutrition Therapy:  Appt start time: 1100 end time:  1130. Therapist: Sherilyn Cooter Psychiatrist: [name], MD  Assessment:  Primary concerns today: Elevated blood glucose (pre-DM) levels (R73.09) and eating disorder (F50.9).     [name] has lost 10 lb in the past 6 weeks, likely related to her recent stress levels, which have been severe.  Documentation in RR.com suggests intake is minimal, i.e., yogurt or 1 pb sandwich for a meal.  Her dad is home from the nursing home, and although she has help from siblings, she is the primary care giver to both parents, both of whom are in wheelchairs.  Difficulties are exacerbated by the constant conflict between her parents.  [name] has been recording daily intake on [email or web address], and over the weekend sent a message that she was overwhelmed, and is just giving up on eating, and does not plan to follow up with nutrition appts after today.  By the end of today's appt, she was willing to commit to continuing efforts to eat, even when she does not feel hungry.  We had a long discussion about self-care, and ways in which she can try to preserve her own well-being while she is in the tough position of care giver to her parents.    Progress:  In Progress.    Nutritional Diagnosis:  NB-1.5 Disordered eating pattern as related to weight anxiety as evidenced by continued denial of hunger signals and expressed disinterest in food.      Intervention:  Nutrition counseling.     Monitoring/Evaluation:  Dietary intake, body weight, and physical activity in 4 weeks.

## 2015-02-27 NOTE — Patient Instructions (Signed)
-   Groceries to keep on hand: Yogurt, milk, fruit, veg's (esp frozen), salad greens, carrots, cheese, tuna, beans, high-fiber cereal (at least 5 g fiber/serving), bread, sweet potatoes.  - Food goals: 1. Eat at least 3 meals and 1-2 snacks per day.  Aim for no more than 5 hours between eating.  2. Veg's 2 X day.   - Record intake on http://www.richardson.info/.  - Remember your leverage, and use this to further your ability for self-care.      -They need you!  And if you are to continue helping out in the capacity you have been, there are some definite and significant changes that need to take place for you to do so while maintaining your sanity.  (In other words, you CAN continue to help if certain changes are made that will allow you to preserve some of your own well-being.)

## 2015-03-06 ENCOUNTER — Other Ambulatory Visit: Payer: Self-pay | Admitting: Adult Health

## 2015-03-21 ENCOUNTER — Encounter: Payer: Self-pay | Admitting: Family Medicine

## 2015-03-21 ENCOUNTER — Ambulatory Visit (INDEPENDENT_AMBULATORY_CARE_PROVIDER_SITE_OTHER): Payer: Medicare Other | Admitting: Family Medicine

## 2015-03-21 VITALS — Ht 60.25 in | Wt 146.3 lb

## 2015-03-21 DIAGNOSIS — E46 Unspecified protein-calorie malnutrition: Secondary | ICD-10-CM

## 2015-03-21 DIAGNOSIS — E089 Diabetes mellitus due to underlying condition without complications: Secondary | ICD-10-CM

## 2015-03-21 DIAGNOSIS — F509 Eating disorder, unspecified: Secondary | ICD-10-CM

## 2015-03-21 NOTE — Patient Instructions (Addendum)
-   Consider for therapy:  Dorene Grebe, PhD at [facility]: [phone]. - Also look at:  [email or web address]/  Food goals: Eat at least 4 X per day.  Five or 6 would be better.   - Aim for small amounts of food more frequently.   Get at least one serving of fruit and/or veg's per day UNLESS you determine these are tolerated even less well.   Continue to record intake on http://www.richardson.info/.

## 2015-03-21 NOTE — Progress Notes (Signed)
Medical Nutrition Therapy:  Appt start time: 1100 end time:  1130. Therapist: Sherilyn Cooter Psychiatrist: [name], MD  Assessment:  Primary concerns today: Elevated blood glucose (pre-DM) levels (R73.09) and eating disorder (F50.9).     Syrena has been recording daily intake in http://www.richardson.info/.  She continues to struggle with eating secondary to depression and the stress of dealing with her ailing parents.  (She is living at their home.)  Does not feel she has much support from her 3 siblings.  She has been having chronic stomach pain each time she eats, which also limits her eating.  Milk is especially bad.  She is scheduled for both colonoscopy and endoscopy on Feb 15, but she might have to reschedule it b/c none of her siblings can stay with her parents or drive Annorah to/from her appt.    24-hr recall suggests intake of ~980 kcal:  (Up at  AM) B (9 AM)-  1 orange       60 Snk ( AM)-   L (3 PM)-  1 slice of broccoli and cheese quiche  460 Snk ( PM)-   D ([id number] PM)-  1 slice of broccoli and cheese quiche 460 Snk ( PM)-   Typical day? Yes.    Progress:  In Progress.    Nutritional Diagnosis:  NB-1.5 Disordered eating pattern as related to weight anxiety as evidenced by continued denial of hunger signals and expressed disinterest in food.      Intervention:  Nutrition counseling.     Monitoring/Evaluation:  Dietary intake, body weight, and physical activity in 4 weeks.

## 2015-03-24 ENCOUNTER — Other Ambulatory Visit: Payer: Self-pay | Admitting: Gastroenterology

## 2015-03-28 ENCOUNTER — Encounter (HOSPITAL_COMMUNITY): Payer: Self-pay | Admitting: *Deleted

## 2015-03-28 ENCOUNTER — Ambulatory Visit: Payer: Medicare Other | Admitting: Family Medicine

## 2015-03-28 NOTE — Addendum Note (Signed)
Addended by: Teena Irani on: 03/28/2015 09:13 AM   Modules accepted: Orders

## 2015-03-28 NOTE — Progress Notes (Signed)
Pt denies SOB, chest pain, and being under the care of a cardiologist. Pt denies having a cardiac cath and stress test but stated that an echo was performed in 1987. Pt made aware to stop taking Aspirin, otc vitamins, fish oil and herbal medications. Do not take any NSAIDs ie: Ibuprofen, Advil, Naproxen or any medication containing Aspirin. Pt made aware of diabetic protocol to check BS every 1-2 hours prior to arrival, interventions for a BS <70 and >220 and # to Endo. Pt stated that she took her last dose of Metformin on Sunday. Pt made aware to bring in inhaler on DOS. Pt verbalized understanding of all pre-op instructions. Christina, nursing staff at MD's office, clarified that it was okay for pt to take some  AM medications on DOS with a sip of water.

## 2015-03-29 ENCOUNTER — Encounter (HOSPITAL_COMMUNITY): Payer: Self-pay | Admitting: *Deleted

## 2015-03-29 ENCOUNTER — Ambulatory Visit (HOSPITAL_COMMUNITY): Payer: Medicare Other | Admitting: Certified Registered Nurse Anesthetist

## 2015-03-29 ENCOUNTER — Ambulatory Visit (HOSPITAL_COMMUNITY)
Admission: RE | Admit: 2015-03-29 | Discharge: 2015-03-29 | Disposition: A | Discharge status: Home / Self Care | Payer: Medicare Other | Source: Ambulatory Visit | Attending: Gastroenterology | Admitting: Gastroenterology

## 2015-03-29 ENCOUNTER — Encounter (HOSPITAL_COMMUNITY)
Admission: RE | Disposition: A | Discharge status: Home / Self Care | Payer: Self-pay | Source: Ambulatory Visit | Attending: Gastroenterology

## 2015-03-29 DIAGNOSIS — Z87891 Personal history of nicotine dependence: Secondary | ICD-10-CM | POA: Insufficient documentation

## 2015-03-29 DIAGNOSIS — D125 Benign neoplasm of sigmoid colon: Secondary | ICD-10-CM | POA: Diagnosis not present

## 2015-03-29 DIAGNOSIS — K3184 Gastroparesis: Secondary | ICD-10-CM | POA: Diagnosis present

## 2015-03-29 DIAGNOSIS — J45909 Unspecified asthma, uncomplicated: Secondary | ICD-10-CM | POA: Insufficient documentation

## 2015-03-29 DIAGNOSIS — K2970 Gastritis, unspecified, without bleeding: Secondary | ICD-10-CM | POA: Diagnosis not present

## 2015-03-29 DIAGNOSIS — I10 Essential (primary) hypertension: Secondary | ICD-10-CM | POA: Insufficient documentation

## 2015-03-29 DIAGNOSIS — D127 Benign neoplasm of rectosigmoid junction: Secondary | ICD-10-CM | POA: Insufficient documentation

## 2015-03-29 HISTORY — PX: ESOPHAGOGASTRODUODENOSCOPY (EGD) WITH PROPOFOL: SHX5813

## 2015-03-29 HISTORY — PX: COLONOSCOPY WITH PROPOFOL: SHX5780

## 2015-03-29 HISTORY — PX: BOTOX INJECTION: SHX5754

## 2015-03-29 LAB — GLUCOSE, CAPILLARY: Glucose-Capillary: 82 mg/dL (ref 65–99)

## 2015-03-29 SURGERY — ESOPHAGOGASTRODUODENOSCOPY (EGD) WITH PROPOFOL
Anesthesia: Monitor Anesthesia Care

## 2015-03-29 MED ORDER — PROPOFOL 10 MG/ML IV BOLUS
INTRAVENOUS | Status: DC | PRN
  Administered 2015-03-29 (×2): 20 mg via INTRAVENOUS

## 2015-03-29 MED ORDER — SODIUM CHLORIDE 0.9 % IV SOLN: INTRAVENOUS | Status: DC

## 2015-03-29 MED ORDER — ONABOTULINUMTOXINA 100 UNITS IJ SOLR
INTRAMUSCULAR | Status: DC | PRN
  Administered 2015-03-29: 100 mL via SUBMUCOSAL

## 2015-03-29 MED ORDER — PROPOFOL 500 MG/50ML IV EMUL
INTRAVENOUS | Status: DC | PRN
  Administered 2015-03-29: 60 ug/kg/min via INTRAVENOUS

## 2015-03-29 MED ORDER — LACTATED RINGERS IV SOLN
INTRAVENOUS | Status: DC
  Administered 2015-03-29 (×2): via INTRAVENOUS

## 2015-03-29 MED ORDER — SODIUM CHLORIDE 0.9 % IJ SOLN
100.0000 [IU] | Freq: Once | INTRAMUSCULAR | Status: DC
  Filled 2015-03-29: qty 100

## 2015-03-29 NOTE — Transfer of Care (Signed)
Immediate Anesthesia Transfer of Care Note  Patient: Becky [name]  Procedure(s) Performed: Procedure(s): ESOPHAGOGASTRODUODENOSCOPY (EGD) WITH PROPOFOL (N/A) BOTOX INJECTION (N/A) COLONOSCOPY WITH PROPOFOL (N/A)  Patient Location: PACU  Anesthesia Type:MAC  Level of Consciousness: awake, alert  and oriented  Airway & Oxygen Therapy: Patient Spontanous Breathing  Post-op Assessment: Report given to RN and Post -op Vital signs reviewed and stable  Post vital signs: stable  Last Vitals:  Filed Vitals:   03/29/15 0818 03/29/15 1006  BP: 93/59   Pulse: 59 64  Temp: 36.7 C   Resp: 9 12    Complications: No apparent anesthesia complications

## 2015-03-29 NOTE — Op Note (Signed)
[facility] [place], [address]   COLONOSCOPY PROCEDURE REPORT     EXAM DATE: Apr 26, 2015  PATIENT NAME:      Becky [name], Becky [name]           MR #: [id number] BIRTHDATE:       January 29, 1956      VISIT #:     [phone]  ATTENDING:     Teena Irani, MD     STATUS:     outpatient ASSISTANT:  INDICATIONS:  The patient is a 60 yr old female here for a colonoscopy due to PROCEDURE PERFORMED:     colonoscopy with polypectomy MEDICATIONS:     M a C ESTIMATED BLOOD LOSS:     None  CONSENT: The patient understands the risks and benefits of the procedure and understands that these risks include, but are not limited to: sedation, allergic reaction, infection, perforation and/or bleeding. Alternative means of evaluation and treatment include, among others: physical exam, x-rays, and/or surgical intervention. The patient elects to proceed with this endoscopic procedure.  DESCRIPTION OF PROCEDURE: During intra-op preparation period all mechanical & medical equipment was checked for proper function. Hand hygiene and appropriate measures for infection prevention was taken. After the risks, benefits and alternatives of the procedure were thoroughly explained, Informed consent was verified, confirmed and timeout was successfully executed by the treatment team. A digital exam    The Pentax colonoscope      endoscope was introduced through the anus and advanced to the cecum     . prep quality was somewhat limited and I could not rule out lesions less than 1 cm in all areas. The instrument was then slowly withdrawn as the colon was fully examined.Estimated blood loss is zero unless otherwise noted in this procedure report. the cecum and ascending transverse descending and sigmoid colon all appeared normal with no masses polyps diverticula or other mucosal abnormalities. At the rectosigmoid junction there was a 6 x 3 mm sessile polyp fulgurated by hot  biopsy. The remainder of the rectum appeared normal      retroflexed view of anus was unremarkable.      The scope was then completely withdrawn from the patient and the procedure terminated. SCOPE WITHDRAWAL TIME:    ADVERSE EVENTS:      There were no immediate complications.  IMPRESSIONS:     rectosigmoid polyp  RECOMMENDATIONS:     await histology to determine interval for next colonoscopy RECALL:  _____________________________ Teena Irani, MD eSigned:  Teena Irani, MD 26-Apr-2015 10:05 AM   cc:   CPT CODES: ICD CODES:  The ICD and CPT codes recommended by this software are interpretations from the data that the clinical staff has captured with the software.  The verification of the translation of this report to the ICD and CPT codes and modifiers is the sole responsibility of the health care institution and practicing physician where this report was generated.  [employer]. will not be held responsible for the validity of the ICD and CPT codes included on this report.  AMA assumes no liability for data contained or not contained herein. CPT is a Designer, television/film set of the [employer].   PATIENT NAME:  Becky [name], Becky [name] MR#: [id number]

## 2015-03-29 NOTE — H&P (View-Only) (Signed)
Pt denies SOB, chest pain, and being under the care of a cardiologist. Pt denies having a cardiac cath and stress test but stated that an echo was performed in 1987. Pt made aware to stop taking Aspirin, otc vitamins, fish oil and herbal medications. Do not take any NSAIDs ie: Ibuprofen, Advil, Naproxen or any medication containing Aspirin. Pt made aware of diabetic protocol to check BS every 1-2 hours prior to arrival, interventions for a BS <70 and >220 and # to Endo. Pt stated that she took her last dose of Metformin on Sunday. Pt made aware to bring in inhaler on DOS. Pt verbalized understanding of all pre-op instructions. Christina, nursing staff at MD's office, clarified that it was okay for pt to take some  AM medications on DOS with a sip of water.

## 2015-03-29 NOTE — Op Note (Signed)
[facility] [place], [address]   ENDOSCOPY PROCEDURE REPORT  PATIENT: Becky [name], Becky [name]  MR#: [id number] BIRTHDATE: [birth year] , 82  yrs. old GENDER: female ENDOSCOPIST: Teena Irani, MD REFERRED BY: PROCEDURE DATE:  2015/03/30 PROCEDURE: ASA CLASS: INDICATIONS:  gastroparesis MEDICATIONS: MATC TOPICAL ANESTHETIC:  DESCRIPTION OF PROCEDURE: After the risks benefits and alternatives of the procedure were thoroughly explained, informed consent was obtained.  The Pentax endoscope      endoscope was introduced through the mouth and advanced to the second portion of the duodenum , Without limitations.  The instrument was slowly withdrawn as the mucosa was fully examined. Estimated blood loss is zero unless otherwise noted in this procedure report.    esophagus: Normal,    stomach: Evidence of previous fundoplication on retroflexion with suture present.   small superficial gastric nodules, not biopsied. Pylorus patent. Injected in 4 quadrants with 25 units of botulinum toxin. Duodenum normal.          The scope was then withdrawn from the patient and the procedure completed.  COMPLICATIONS: There were no immediate complications.  ENDOSCOPIC IMPRESSION: mild gastritis, previous fundoplication, otherwise normal study. Status post botulinum toxin injection.  RECOMMENDATIONS: advance diet and observe response to today's procedure.  REPEAT EXAM:  eSigned:  Teena Irani, MD 03/30/15 9:34 AM    CC:  CPT CODES: ICD CODES:  The ICD and CPT codes recommended by this software are interpretations from the data that the clinical staff has captured with the software.  The verification of the translation of this report to the ICD and CPT codes and modifiers is the sole responsibility of the health care institution and practicing physician where this report was generated.  [place]. will not be held responsible for  the validity of the ICD and CPT codes included on this report.  AMA assumes no liability for data contained or not contained herein. CPT is a Designer, television/film set of the [employer].

## 2015-03-29 NOTE — Anesthesia Postprocedure Evaluation (Signed)
Anesthesia Post Note  Patient: Becky [name]  Procedure(s) Performed: Procedure(s) (LRB): ESOPHAGOGASTRODUODENOSCOPY (EGD) WITH PROPOFOL (N/A) BOTOX INJECTION (N/A) COLONOSCOPY WITH PROPOFOL (N/A)  Patient location during evaluation: PACU Anesthesia Type: MAC Level of consciousness: awake and alert Pain management: pain level controlled Vital Signs Assessment: post-procedure vital signs reviewed and stable Respiratory status: spontaneous breathing, nonlabored ventilation, respiratory function stable and patient connected to nasal cannula oxygen Cardiovascular status: stable and blood pressure returned to baseline Anesthetic complications: no    Last Vitals:  Filed Vitals:   03/29/15 1040 03/29/15 1050  BP: 100/63 105/57  Pulse: 62 62  Temp:    Resp: 11 12    Last Pain:  Filed Vitals:   03/29/15 1056  PainSc: 2                  Catalina Gravel

## 2015-03-29 NOTE — Interval H&P Note (Signed)
History and Physical Interval Note:  03/29/2015 9:12 AM  Becky [name]  has presented today for surgery, with the diagnosis of gastroparesis/anemia/constipation  The various methods of treatment have been discussed with the patient and family. After consideration of risks, benefits and other options for treatment, the patient has consented to  Procedure(s): ESOPHAGOGASTRODUODENOSCOPY (EGD) WITH PROPOFOL (N/A) BOTOX INJECTION (N/A) COLONOSCOPY WITH PROPOFOL (N/A) as a surgical intervention .  The patient's history has been reviewed, patient examined, no change in status, stable for surgery.  I have reviewed the patient's chart and labs.  Questions were answered to the patient's satisfaction.     Terrie Grajales C

## 2015-03-29 NOTE — Anesthesia Preprocedure Evaluation (Addendum)
Anesthesia Evaluation  Patient identified by MRN, date of birth, ID band Patient awake    Reviewed: Allergy & Precautions, NPO status , Patient's Chart, lab work & pertinent test results, reviewed documented beta blocker date and time   History of Anesthesia Complications Negative for: history of anesthetic complications  Airway Mallampati: I  TM Distance: >3 FB Neck ROM: Full    Dental  (+) Dental Advisory Given, Missing, Partial Lower   Pulmonary asthma , former smoker,    Pulmonary exam normal breath sounds clear to auscultation       Cardiovascular hypertension, Pt. on medications and Pt. on home beta blockers (-) angina+ Peripheral Vascular Disease  (-) CAD Normal cardiovascular exam Rhythm:Regular Rate:Normal     Neuro/Psych  Headaches, PSYCHIATRIC DISORDERS Anxiety Depression  Neuromuscular disease (right foot neuropathy)    GI/Hepatic Neg liver ROS, GERD  Medicated,gastroparesis   Endo/Other  diabetes, Type 2, Oral Hypoglycemic Agents  Renal/GU negative Renal ROS     Musculoskeletal  (+) Arthritis , Osteoarthritis,    Abdominal   Peds  Hematology  (+) Blood dyscrasia, anemia ,   Anesthesia Other Findings Day of surgery medications reviewed with the patient.  Reproductive/Obstetrics                            Anesthesia Physical Anesthesia Plan  ASA: III  Anesthesia Plan: MAC   Post-op Pain Management:    Induction: Intravenous  Airway Management Planned: Nasal Cannula  Additional Equipment:   Intra-op Plan:   Post-operative Plan:   Informed Consent: I have reviewed the patients History and Physical, chart, labs and discussed the procedure including the risks, benefits and alternatives for the proposed anesthesia with the patient or authorized representative who has indicated his/her understanding and acceptance.   Dental advisory given  Plan Discussed with: CRNA and  Anesthesiologist  Anesthesia Plan Comments: (Discussed risks/benefits/alternatives to MAC sedation including need for ventilatory support, hypotension, need for conversion to general anesthesia.  All patient questions answered.  Patient wished to proceed.)        Anesthesia Quick Evaluation

## 2015-03-30 ENCOUNTER — Encounter (HOSPITAL_COMMUNITY): Payer: Self-pay | Admitting: Gastroenterology

## 2015-04-07 ENCOUNTER — Ambulatory Visit: Payer: Medicare Other | Admitting: Pulmonary Disease

## 2015-04-12 ENCOUNTER — Other Ambulatory Visit: Payer: Self-pay | Admitting: Gastroenterology

## 2015-04-12 DIAGNOSIS — R109 Unspecified abdominal pain: Secondary | ICD-10-CM

## 2015-04-17 ENCOUNTER — Ambulatory Visit
Admission: RE | Admit: 2015-04-17 | Discharge: 2015-04-17 | Disposition: A | Discharge status: Home / Self Care | Payer: Medicare Other | Source: Ambulatory Visit | Attending: Gastroenterology | Admitting: Gastroenterology

## 2015-04-17 DIAGNOSIS — R109 Unspecified abdominal pain: Secondary | ICD-10-CM

## 2015-04-17 MED ORDER — IOPAMIDOL (ISOVUE-300) INJECTION 61%
100.0000 mL | Freq: Once | INTRAVENOUS | Status: AC | PRN
  Administered 2015-04-17: 100 mL via INTRAVENOUS

## 2015-04-18 ENCOUNTER — Ambulatory Visit (INDEPENDENT_AMBULATORY_CARE_PROVIDER_SITE_OTHER): Payer: Medicare Other | Admitting: Family Medicine

## 2015-04-18 ENCOUNTER — Encounter: Payer: Self-pay | Admitting: Family Medicine

## 2015-04-18 VITALS — Ht 60.25 in | Wt 146.9 lb

## 2015-04-18 DIAGNOSIS — F509 Eating disorder, unspecified: Secondary | ICD-10-CM | POA: Diagnosis not present

## 2015-04-18 DIAGNOSIS — E089 Diabetes mellitus due to underlying condition without complications: Secondary | ICD-10-CM | POA: Diagnosis not present

## 2015-04-18 DIAGNOSIS — E46 Unspecified protein-calorie malnutrition: Secondary | ICD-10-CM | POA: Diagnosis not present

## 2015-04-18 NOTE — Patient Instructions (Addendum)
-   Iron supplement:  Dr. Amedeo Plenty might want to write a Rx for Integra, which is a Rx supplement that includes iron and vit C.    - Another option is to just get 325 mg ferrous sulphate or gluconate, and take with 200-500 mg vitamin C.   - Make a grocery list before you shop each time; add to your mom's list.    - Canned beans  - Canned soups, i.e., lentil, black bean  - Frozen lima beans  - Frozen veg's  - Carrots  - Fresh fruit  - Yogurt  - Cereal  - Soymilk  - Tuna  - Sweet potatoes  Include a veg and/or fruit at at least 2 meals a day.   Default dinner:  1/2 can of beans, frozen veg, rice or sweet potato.  When you can't decide, have this meal.

## 2015-04-18 NOTE — Progress Notes (Signed)
Medical Nutrition Therapy:  Appt start time: 1100 end time:  1130. Therapist: Sherilyn Cooter Psychiatrist: [name], MD  Assessment:  Primary concerns today: Elevated blood glucose (pre-DM) levels (R73.09) and eating disorder (F50.9).     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  This has relieved some of the symptoms of gastroparesis, but she is still eating less than optimal as she continues to live at her parents' and to deal with the stresses involved in helping to care for them.  Her dad may be out of a wheelchair in 3-4 weeks, so she may be able to move back home when this happens.  Becky [name] is still having lower abdominal pain, for which she had a CT scan with contrast yesterday, but at least her discomfort is improved since procedure on 2/15.    24-hr recall:  (Up at 8 AM) B (9 AM)-  No bkfast b/c of CT scan on lower abdominal Snk ( AM)-  water L ( PM)-  1/2 c broccoli, 1 oz cheese, 1 baked potato, diet tea Snk ( PM)-  --- D ( PM)-  1 c mashed potatoes, 1 1/2 oz cheese, 1 c green beans, 2 round slices canned pineapple, diet tea Snk ( PM)-  --- Typical day? Yes.   other than skipping breakfast.    Progress:  In Progress.    Nutritional Diagnosis:  NB-1.5 Disordered eating pattern as related to weight anxiety as evidenced by continued denial of hunger signals and expressed disinterest in food.      Intervention:  Nutrition counseling.     Monitoring/Evaluation:  Dietary intake, body weight, and physical activity in 4 weeks.

## 2015-05-12 ENCOUNTER — Other Ambulatory Visit: Payer: Self-pay

## 2015-05-12 DIAGNOSIS — Z1231 Encounter for screening mammogram for malignant neoplasm of breast: Secondary | ICD-10-CM

## 2015-05-16 ENCOUNTER — Ambulatory Visit: Payer: Medicare Other | Admitting: Family Medicine

## 2015-05-16 ENCOUNTER — Ambulatory Visit (INDEPENDENT_AMBULATORY_CARE_PROVIDER_SITE_OTHER): Payer: Medicare Other | Admitting: Family Medicine

## 2015-05-16 ENCOUNTER — Encounter: Payer: Self-pay | Admitting: Family Medicine

## 2015-05-16 VITALS — Ht 60.25 in | Wt 147.2 lb

## 2015-05-16 DIAGNOSIS — E089 Diabetes mellitus due to underlying condition without complications: Secondary | ICD-10-CM

## 2015-05-16 DIAGNOSIS — E46 Unspecified protein-calorie malnutrition: Secondary | ICD-10-CM | POA: Diagnosis not present

## 2015-05-16 DIAGNOSIS — F509 Eating disorder, unspecified: Secondary | ICD-10-CM | POA: Diagnosis not present

## 2015-05-16 NOTE — Patient Instructions (Addendum)
Therapists:  Try https://eatingdisorderprofessionals.com/ for list of therapists.  Also check out [facility].    Goals:  - Iron supplement: 325 mg ferrous sulphate or gluconate, and take with 200-500 mg vitamin C (or ask Dr. Amedeo Plenty for Rx for Integra). - Include a veg and/or fruit at at least 2 meals a day.  - Default dinner: 1/2 can of beans, frozen veg, rice or sweet potato. When you can't decide, have this meal.  - Ask your siblings for some relief on your parent responsibilities!

## 2015-05-16 NOTE — Progress Notes (Signed)
Medical Nutrition Therapy:  Appt start time: 1100 end time:  1130. Therapist: Sherilyn Cooter Psychiatrist: [name], MD  Assessment:  Primary concerns today: Elevated blood glucose (pre-DM) levels (R73.09) and eating disorder (F50.9).     Adysin is still staying with her parents full-time.  She feels exhausted, needing to battle with her dad most of the time to get him to eat or do other self-care practices.  Still says she is never hungry, and has poor motivation to eat or to spend any effort at meal preparation.   Kenetha is usually getting 3 small meals daily, and 0-1 snack of fruit daily.  Getting veg's about 1 X day.    24-hr recall suggests intake of 1420 kcal:  (Up at 8:30 AM) B (9 AM)-  1 PB&J, water    360 Snk ( AM)-  water L (1:30 PM)-  1 slc chs-broc quiche, diet tea  460 Snk (3 PM)-  1 orange      60 D (7 PM)-  1 slc chs-broc quiche, diet tea  460 Snk ( PM)-  --- Typical day? Yes.    Progress:  In Progress.    Nutritional Diagnosis:  NB-1.5 Disordered eating pattern as related to weight anxiety as evidenced by continued denial of hunger signals and expressed disinterest in food.      Intervention:  Nutrition counseling.     Monitoring/Evaluation:  Dietary intake, body weight, and physical activity in 4 weeks.

## 2015-05-19 ENCOUNTER — Telehealth: Payer: Self-pay | Admitting: Adult Health

## 2015-05-19 NOTE — Telephone Encounter (Signed)
Spoke with [employer] with [employer].  Was advised they faxed over a form for neb and neb supplies on 05/05/15, 05/11/15 and 05/19/15 under TP's name to be completed and signed.  Requesting status.  Amanda/Anita, have either of you seen this form?

## 2015-05-22 NOTE — Telephone Encounter (Signed)
I just checked TP CMN folder and there isn't a form for this patient in there. If I remember correctly we faxed one of the forms back stating we were not going to sign because the patient has not been seen since 06/2014 and she has cancelled her appts. A form was signed on 03/06 and faxed on 04/18/2015. I just tried calling [facility] [employer] and had to leave a message wanted to see if they got the other fax with denial of signature. Waiting for call back

## 2015-05-22 NOTE — Telephone Encounter (Signed)
I just spoke with Richardson Landry with [employer] [facility] and explained that this patient has not been seen since 06/2014 and she cancelled the last 2 appts. She needs to be seen before we will give new Rx for Neb meds. He is making a note in their records about patient needing appt.

## 2015-06-02 ENCOUNTER — Ambulatory Visit: Payer: Medicare Other

## 2015-06-07 ENCOUNTER — Ambulatory Visit: Payer: Medicare Other

## 2015-06-13 ENCOUNTER — Ambulatory Visit: Payer: Medicare Other | Admitting: Family Medicine

## 2015-06-15 ENCOUNTER — Other Ambulatory Visit: Payer: Self-pay | Admitting: Adult Health

## 2015-06-19 ENCOUNTER — Telehealth: Payer: Self-pay | Admitting: Adult Health

## 2015-06-19 MED ORDER — NEBIVOLOL HCL 10 MG PO TABS: 10.0000 mg | ORAL_TABLET | Freq: Every day | ORAL | Status: DC

## 2015-06-19 NOTE — Telephone Encounter (Signed)
Spoke with [facility]. The pt needs a refill on Bystolic. Rx has been sent in. Nothing further was needed.

## 2015-06-20 ENCOUNTER — Ambulatory Visit: Payer: Medicare Other | Admitting: Family Medicine

## 2015-06-26 ENCOUNTER — Ambulatory Visit (INDEPENDENT_AMBULATORY_CARE_PROVIDER_SITE_OTHER): Payer: Medicare Other | Admitting: Family Medicine

## 2015-06-26 ENCOUNTER — Encounter: Payer: Self-pay | Admitting: Family Medicine

## 2015-06-26 VITALS — Ht 60.25 in | Wt 148.4 lb

## 2015-06-26 DIAGNOSIS — F509 Eating disorder, unspecified: Secondary | ICD-10-CM

## 2015-06-26 DIAGNOSIS — E46 Unspecified protein-calorie malnutrition: Secondary | ICD-10-CM | POA: Diagnosis not present

## 2015-06-26 DIAGNOSIS — E089 Diabetes mellitus due to underlying condition without complications: Secondary | ICD-10-CM | POA: Diagnosis not present

## 2015-06-26 NOTE — Patient Instructions (Signed)
-   Get back to the gym:  1. Set a schedule:  At  least 30 min 3 X wk this week, and at least 30 min walking 3 X wk starting next week and until you return from [employer].  Increase 4 X wk starting in June.     2. Have a specific plan for the gym, i.e., ___ min on TM; ___ min bike; various machines and/or calisthenics, targeting quadriceps, ab's, and back (i.e., row), and some stretching.    3. Record on your Goals Sheet when you work out.    - Food goals:  Vegetables twice a day; protein with each meal.  Continue to get variety of each.    - Pull out the workbook for AN recovery, and give it a try.

## 2015-06-26 NOTE — Progress Notes (Signed)
Medical Nutrition Therapy:  Appt start time: 1100 end time:  1130. Therapist: Sherilyn Cooter Psychiatrist: [name], MD  Assessment:  Primary concerns today: Elevated blood glucose (pre-DM) levels (R73.09) and eating disorder (F50.9).     [name] was sick sick last week with a URI, but is feeling better now.  Appetite was affected some b/c of sore throat.  She has still been spending a lot of time at her parents' b/c her mother is very uncomfortable with [name] not being there.  At the end of the month, [name] will be caring for her niece's children in [place] for a few days, then her sister's pets in early June, so her mom is going to have to get used to not having Becky [name] there.   [name] would like to start exercising at the gym again, but hasn't managed to commit to this.  She feels torn by her mom's insistence that she be there most of the time, and also said she really wants to go to the gym just when her ex-husband is working out.  I pointed out to her that this is making her own decision as to getting more fit completely dependent on others.  She agreed, and said she feels she can commit to going on her own.    24-hr recall suggests intake of 2010 kcal:  (Up at  AM) B (9 AM)-  1 peanut butter and (sug-free) jelly sandwich, 1 banana, water  480 Snk ( AM)-   L (2:30 PM)-  1 pimiento cheese sandwich, tossed salad (no dressing), unswt tea 380 Snk (5 PM)-  1 orange D (7 PM)-  1 soy hotdog, tossed salad (no dressing), unswt tea   150 Snk ( PM)-   Typical day? Yes.    Progress:  In Progress.    Nutritional Diagnosis:  Some progress on NB-1.5 Disordered eating pattern as related to weight anxiety as evidenced by consistent pattern of 3 meals/day (although food quantity is limited), consistent protein intake, and increasing number of meals containing vegetables.     Intervention:  Nutrition counseling.     Monitoring/Evaluation:  Dietary intake, body weight, and physical activity  in 4 weeks.

## 2015-07-13 ENCOUNTER — Ambulatory Visit: Payer: Medicare Other

## 2015-07-17 ENCOUNTER — Ambulatory Visit (INDEPENDENT_AMBULATORY_CARE_PROVIDER_SITE_OTHER): Payer: Medicare Other | Admitting: Family Medicine

## 2015-07-17 ENCOUNTER — Encounter: Payer: Self-pay | Admitting: Family Medicine

## 2015-07-17 VITALS — Ht 60.25 in | Wt 148.2 lb

## 2015-07-17 DIAGNOSIS — R7309 Other abnormal glucose: Secondary | ICD-10-CM

## 2015-07-17 DIAGNOSIS — F509 Eating disorder, unspecified: Secondary | ICD-10-CM | POA: Diagnosis not present

## 2015-07-17 NOTE — Progress Notes (Signed)
Medical Nutrition Therapy:  Appt start time: 1100 end time:  1130. Therapist: Sherilyn Cooter Psychiatrist: [name], MD  Assessment:  Primary concerns today: Elevated blood glucose (pre-DM) levels (R73.09) and eating disorder (F50.9).     Becky [name] recently spent almost a week at her niece's in [place], helping watch her 2- and 6-YO kids, cats, and a dog while they were away.  It was pretty stressful, and food choices were limited while there, i.e., few fruits or veg's.  She has not been home yet; has been staying at her parents again to help care for them.  Starting today, Becky [name] will be staying at her sister's to care for her two dogs for the next week.    Becky [name]'s plans to start exercising regularly were side-tracked while in [place], and fatigue is likely to keep her procrastinating.  Sleep is very poor most nights.    In addn, Becky [name] has been having daily headaches for the past several months, and these have been increasing in severity.  No recall done today.  Since Becky [name] has been home from [place], she has eaten almost nothing but cheese pizza (2 slc) and PB&J sandwiches (1 T pb).  She said she is not sure why the fruits and veg's have fallen off so much; just is not thinking about meal preparation.  When at her parents' there is little interest in eating; neither of her parents follow a routine schedule of eating either.    Progress:  In Progress.    Nutritional Diagnosis:  Regression on NB-1.5 Disordered eating pattern as related to weight anxiety as evidenced by consistent pattern of 3 small meals/day with only the rare meals containing vegetables.     Intervention:  Nutrition counseling.     Monitoring/Evaluation:  Dietary intake, body weight, and physical activity in 4 weeks.

## 2015-07-17 NOTE — Patient Instructions (Addendum)
-   Check to see what foods you need to get.  [facility] ST[facility] RUN TODAY!  Be sure to get some veg's.    - When you see Dr. Albertine Patricia June 21, tell her about your headaches.    - Talk again to your mom about a meal schedule.    - Start using the Eating D/O art workbook TODAY!  Bring to follow-up appt.    - Food goals:  1. Eat at least 3 meals and 1 snack daily.    2. Include veg's at both lunch and dinner.   3. Get at least one serving of fruit daily.

## 2015-08-06 ENCOUNTER — Emergency Department (HOSPITAL_COMMUNITY): Payer: Medicare Other

## 2015-08-06 ENCOUNTER — Emergency Department (HOSPITAL_COMMUNITY)
Admission: EM | Admit: 2015-08-06 | Discharge: 2015-08-06 | Disposition: A | Discharge status: Home / Self Care | Payer: Medicare Other | Attending: Emergency Medicine | Admitting: Emergency Medicine

## 2015-08-06 ENCOUNTER — Encounter (HOSPITAL_COMMUNITY): Payer: Self-pay | Admitting: *Deleted

## 2015-08-06 DIAGNOSIS — Z791 Long term (current) use of non-steroidal anti-inflammatories (NSAID): Secondary | ICD-10-CM | POA: Insufficient documentation

## 2015-08-06 DIAGNOSIS — F329 Major depressive disorder, single episode, unspecified: Secondary | ICD-10-CM | POA: Insufficient documentation

## 2015-08-06 DIAGNOSIS — M170 Bilateral primary osteoarthritis of knee: Secondary | ICD-10-CM | POA: Insufficient documentation

## 2015-08-06 DIAGNOSIS — Y939 Activity, unspecified: Secondary | ICD-10-CM | POA: Insufficient documentation

## 2015-08-06 DIAGNOSIS — Y929 Unspecified place or not applicable: Secondary | ICD-10-CM | POA: Insufficient documentation

## 2015-08-06 DIAGNOSIS — E119 Type 2 diabetes mellitus without complications: Secondary | ICD-10-CM | POA: Diagnosis not present

## 2015-08-06 DIAGNOSIS — M79675 Pain in left toe(s): Secondary | ICD-10-CM | POA: Diagnosis present

## 2015-08-06 DIAGNOSIS — S92535A Nondisplaced fracture of distal phalanx of left lesser toe(s), initial encounter for closed fracture: Secondary | ICD-10-CM | POA: Insufficient documentation

## 2015-08-06 DIAGNOSIS — J45909 Unspecified asthma, uncomplicated: Secondary | ICD-10-CM | POA: Diagnosis not present

## 2015-08-06 DIAGNOSIS — Z7984 Long term (current) use of oral hypoglycemic drugs: Secondary | ICD-10-CM | POA: Diagnosis not present

## 2015-08-06 DIAGNOSIS — Z7951 Long term (current) use of inhaled steroids: Secondary | ICD-10-CM | POA: Diagnosis not present

## 2015-08-06 DIAGNOSIS — Z87891 Personal history of nicotine dependence: Secondary | ICD-10-CM | POA: Diagnosis not present

## 2015-08-06 DIAGNOSIS — X58XXXA Exposure to other specified factors, initial encounter: Secondary | ICD-10-CM | POA: Insufficient documentation

## 2015-08-06 DIAGNOSIS — Z79899 Other long term (current) drug therapy: Secondary | ICD-10-CM | POA: Insufficient documentation

## 2015-08-06 DIAGNOSIS — I10 Essential (primary) hypertension: Secondary | ICD-10-CM | POA: Insufficient documentation

## 2015-08-06 DIAGNOSIS — Y999 Unspecified external cause status: Secondary | ICD-10-CM | POA: Diagnosis not present

## 2015-08-06 MED ORDER — ACETAMINOPHEN 500 MG PO TABS: 500.0000 mg | ORAL_TABLET | Freq: Four times a day (QID) | ORAL | Status: DC | PRN

## 2015-08-06 NOTE — ED Notes (Signed)
Pt complains of pain, bruising to her left 5th toe since yesterday morning. Pt does not recall injuring her foot. Pt has limited ROM in her 5th toe.

## 2015-08-06 NOTE — Discharge Instructions (Signed)
Toe Fracture °A toe fracture is a break in one of the toe bones (phalanges). °CAUSES °This condition may be caused by: °· Dropping a heavy object on your toe. °· Stubbing your toe. °· Overusing your toe or doing repetitive exercise. °· Twisting or stretching your toe out of place. °RISK FACTORS °This condition is more likely to develop in people who: °· Play contact sports. °· Have a bone disease. °· Have a low calcium level. °SYMPTOMS °The main symptoms of this condition are swelling and pain in the toe. The pain may get worse with standing or walking. Other symptoms include: °· Bruising. °· Stiffness. °· Numbness. °· A change in the way the toe looks. °· Broken bones that poke through the skin. °· Blood beneath the toenail. °DIAGNOSIS °This condition is diagnosed with a physical exam. You may also have X-rays. °TREATMENT  °Treatment for this condition depends on the type of fracture and its severity. Treatment may involve: °· Taping the broken toe to a toe that is next to it (buddy taping). This is the most common treatment for fractures in which the bone has not moved out of place (nondisplaced fracture). °· Wearing a shoe that has a wide, rigid sole to protect the toe and to limit its movement. °· Wearing a walking cast. °· Having a procedure to move the toe back into place. °· Surgery. This may be needed: °¨ If there are many pieces of broken bone that are out of place (displaced). °¨ If the toe joint breaks. °¨ If the bone breaks through the skin. °· Physical therapy. This is done to help regain movement and strength in the toe. °You may need follow-up X-rays to make sure that the bone is healing well and staying in position. °HOME CARE INSTRUCTIONS °If You Have a Cast: °· Do not stick anything inside the cast to scratch your skin. Doing that increases your risk of infection. °· Check the skin around the cast every day. Report any concerns to your health care provider. You may put lotion on dry skin around the  edges of the cast. Do not apply lotion to the skin underneath the cast. °· Do not put pressure on any part of the cast until it is fully hardened. This may take several hours. °· Keep the cast clean and dry. °Bathing °· Do not take baths, swim, or use a hot tub until your health care provider approves. Ask your health care provider if you can take showers. You may only be allowed to take sponge baths for bathing. °· If your health care provider approves bathing and showering, cover the cast or bandage (dressing) with a watertight plastic bag to protect it from water. Do not let the cast or dressing get wet. °Managing Pain, Stiffness, and Swelling °· If you do not have a cast, apply ice to the injured area, if directed. °¨ Put ice in a plastic bag. °¨ Place a towel between your skin and the bag. °¨ Leave the ice on for 20 minutes, 2-3 times per day. °· Move your toes often to avoid stiffness and to lessen swelling. °· Raise (elevate) the injured area above the level of your heart while you are sitting or lying down. °Driving °· Do not drive or operate heavy machinery while taking pain medicine. °· Do not drive while wearing a cast on a foot that you use for driving. °Activity °· Return to your normal activities as directed by your health care provider. Ask your health care   provider what activities are safe for you.  Perform exercises daily as directed by your health care provider or physical therapist. Safety  Do not use the injured limb to support your body weight until your health care provider says that you can. Use crutches or other assistive devices as directed by your health care provider. General Instructions  If your toe was treated with buddy taping, follow your health care provider's instructions for changing the gauze and tape. Change it more often:  The gauze and tape get wet. If this happens, dry the space between the toes.  The gauze and tape are too tight and cause your toe to become pale  or numb.  Wear a protective shoe as directed by your health care provider. If you were not given a protective shoe, wear sturdy, supportive shoes. Your shoes should not pinch your toes and should not fit tightly against your toes.  Do not use any tobacco products, including cigarettes, chewing tobacco, or e-cigarettes. Tobacco can delay bone healing. If you need help quitting, ask your health care provider.  Take medicines only as directed by your health care provider.  Keep all follow-up visits as directed by your health care provider. This is important. SEEK MEDICAL CARE IF:  You have a fever.  Your pain medicine is not helping.  Your toe is cold.  Your toe is numb.  You still have pain after one week of rest and treatment.  You still have pain after your health care provider has said that you can start walking again.  You have pain, tingling, or numbness in your foot that is not going away. SEEK IMMEDIATE MEDICAL CARE IF:  You have severe pain.  You have redness or inflammation in your toe that is getting worse.  You have pain or numbness in your toe that is getting worse.  Your toe turns blue.   This information is not intended to replace advice given to you by your health care provider. Make sure you discuss any questions you have with your health care provider.   Document Released: 01/26/2000 Document Revised: 10/19/2014 Document Reviewed: 11/24/2013 Elsevier Interactive Patient Education 2016 [place] have been given a podiatric or cast shoe. These rigid shoes are used to help you walk with a cast or treat minor foot sprains and fractures. By providing a stiff platform, these shoes reduce the amount of movement in the joints of the foot which makes walking less painful. As long as your injury is significantly painful, you should use crutches to get around and avoid weight bearing. When your doctor says it is safe to put weight on your injured leg  or foot, you can begin to use your podiatric shoe. Make sure it is adjusted correctly; most of these shoes have velcro straps which makes this easy. You should avoid using any other footwear until your doctor says it is safe.   This information is not intended to replace advice given to you by your health care provider. Make sure you discuss any questions you have with your health care provider.   Document Released: 03/07/2004 Document Revised: 02/18/2014 Document Reviewed: 08/17/2014 Elsevier Interactive Patient Education [employer].

## 2015-08-06 NOTE — ED Provider Notes (Signed)
CSN: [id number]     Arrival date & time 08/06/15  1239 History  By signing my name below, I, Dora Sims, attest that this documentation has been prepared under the direction and in the presence of Will Herold Salguero, PA-C. Electronically Signed: Dora Sims, Scribe. 08/06/2015. 2:45 PM.   Chief Complaint  Patient presents with  . Toe Pain    The history is provided by the patient. No language interpreter was used.     HPI Comments: Becky [name] is a 60 y.o. female with extensive PMHx, including arthritis (bilateral knees), neuropathy (right foot), and chronic pain syndrome, who presents to the Emergency Department complaining of sudden onset, constant, worsening, left 5th toe pain, swelling, and ecchymosis beginning yesterday morning. She denies known injury to her left foot/left toes. She states that she woke up yesterday with mild bruising and severe pain to her left fifth toe; she reports that she experienced worsening swelling and bruising as the day progressed. Pt endorses severe pain exacerbation with bearing weight on her left foot; she has been ambulating with a cane since onset.  Pt denies current numbness/tingling, weakness, neuro deficits, or any other associated symptoms.   Past Medical History  Diagnosis Date  . GERD (gastroesophageal reflux disease)   . Chronic pain syndrome   . Migraine   . Gastroparesis     botox injections  . Anorexia nervosa   . Diabetes mellitus   . Multiple allergies   . Migraine   . Personality change due to conditions classified elsewhere   . Hyperglycemia   . Painful respiration   . Raynaud's syndrome   . Encounter for long-term (current) use of other medications   . Ventral hernia   . Vitamin D deficiency   . Tremor   . Arthritis     bilateral knees  . Hypertension   . Murmur, cardiac     seen cardiologist in past - no workup required  . Asthma     mainly problems in cold weather  . Anxiety   . Depression   . Neuropathy ([place])      right foot  . Anemia   . History of blood transfusion    Past Surgical History  Procedure Laterality Date  . Nissen fundoplication  [id number]    Kaylyn Lim  . Cholecystectomy    . Peg tube placement      removed 1996  . Tonsillectomy and adenoidectomy    . Hernia repair    . Esophagogastroduodenoscopy  08/20/2011    Procedure: ESOPHAGOGASTRODUODENOSCOPY (EGD);  Surgeon: Missy Sabins, MD;  Location: [facility] ENDOSCOPY;  Service: Endoscopy;  Laterality: N/A;  . Esophagogastroduodenoscopy N/A 11/18/2012    Procedure: ESOPHAGOGASTRODUODENOSCOPY (EGD);  Surgeon: Missy Sabins, MD;  Location: [facility] ENDOSCOPY;  Service: Endoscopy;  Laterality: N/A;  . Botox injection N/A 11/18/2012    Procedure: BOTOX INJECTION;  Surgeon: Missy Sabins, MD;  Location: [facility] ENDOSCOPY;  Service: Endoscopy;  Laterality: N/A;  . Esophagogastroduodenoscopy N/A 09/30/2013    Procedure: ESOPHAGOGASTRODUODENOSCOPY (EGD);  Surgeon: Missy Sabins, MD;  Location: [facility] ENDOSCOPY;  Service: Endoscopy;  Laterality: N/A;  . Botox injection N/A 09/30/2013    Procedure: BOTOX INJECTION;  Surgeon: Missy Sabins, MD;  Location: [facility] ENDOSCOPY;  Service: Endoscopy;  Laterality: N/A;  . Lumbar laminectomy/decompression microdiscectomy Right 12/07/2013    Procedure: LUMBAR LAMINECTOMY/DECOMPRESSION MICRODISCECTOMY RIGHT  LUMBAR FIVE-SACRAL ONE ,FORAMINOTOMY;  Surgeon: Floyce Stakes, MD;  Location: [facility] NEURO ORS;  Service: Neurosurgery;  Laterality: Right;  .  Colonoscopy    . Breast surgery    . Esophagogastroduodenoscopy (egd) with propofol N/A 03/29/2015    Procedure: ESOPHAGOGASTRODUODENOSCOPY (EGD) WITH PROPOFOL;  Surgeon: Teena Irani, MD;  Location: [place];  Service: Endoscopy;  Laterality: N/A;  . Botox injection N/A 03/29/2015    Procedure: BOTOX INJECTION;  Surgeon: Teena Irani, MD;  Location: [place];  Service: Endoscopy;  Laterality: N/A;  . Colonoscopy with propofol N/A 03/29/2015    Procedure: COLONOSCOPY WITH PROPOFOL;  Surgeon: Teena Irani, MD;  Location: [place];  Service: Endoscopy;  Laterality: N/A;   Family History  Problem Relation Age of Onset  . Emphysema Paternal Grandfather     was a smoker  . Diabetes Father   . High blood pressure Father   . Cancer - Prostate Father   . Urinary tract infection Mother   . Sleep disorder Mother    Social History  Substance Use Topics  . Smoking status: Former Smoker -- 0.50 packs/day for 2 years    Types: Cigarettes    Quit date: 02/11/1993  . Smokeless tobacco: Never Used  . Alcohol Use: No     Comment: quit ETOH in 1982   OB History    No data available     Review of Systems  Constitutional: Negative for fever.  Musculoskeletal: Positive for joint swelling (left pinky toe) and arthralgias (left pinky toe).  Skin: Negative for rash and wound.  Neurological: Negative for weakness and numbness.       Negative for sensation loss.   Allergies  Methadone; Nitrofurantoin; Percocet; Penicillins; Amoxicillin; Aspirin; Clarithromycin; Codeine; Hydrocodone-acetaminophen; Morphine; Ms contin; Propoxyphene; and Propoxyphene  Home Medications   Prior to Admission medications   Medication Sig Start Date End Date Taking? Authorizing Provider  acetaminophen (TYLENOL) 500 MG tablet Take 1 tablet (500 mg total) by mouth every 6 (six) hours as needed. 08/06/15   Waynetta Pean, PA-C  albuterol (PROAIR HFA) 108 (90 BASE) MCG/ACT inhaler INHALE 2 PUFFS INTO THE LUNGS EVERY 6 HOURS AS NEEDED Patient taking differently: Inhale 2 puffs into the lungs every 6 (six) hours as needed for wheezing or shortness of breath.  03/01/13   [name], MD  albuterol (PROVENTIL) (2.5 MG/3ML) 0.083% nebulizer solution Take 3 mLs (2.5 mg total) by nebulization every 6 (six) hours as needed. DX:  493.00 FILE WITH MCR PART B Patient taking differently: Take 2.5 mg by nebulization every 6 (six) hours as needed for wheezing or shortness of breath. DX:  493.00 FILE WITH MCR PART B 01/03/12    [name], MD  amLODipine (NORVASC) 5 MG tablet Take 5 mg by mouth daily.    Historical Provider, MD  benztropine (COGENTIN) 0.5 MG tablet Take 0.5 mg by mouth at bedtime.     Historical Provider, MD  budesonide (PULMICORT FLEXHALER) 180 MCG/ACT inhaler Inhale 2 puffs into the lungs 2 (two) times daily. 05/30/14   [name], MD  buPROPion (WELLBUTRIN XL) 150 MG 24 hr tablet Take 450 mg by mouth every morning. Take 3 tablets by mouth daily to = 450mg     Historical Provider, MD  Cholecalciferol (VITAMIN D3) 5000 UNITS CAPS Take 4,000 Units by mouth daily.     Historical Provider, MD  clonazePAM (KLONOPIN) 0.5 MG tablet Take 0.5 mg by mouth 3 (three) times daily.     Historical Provider, MD  cycloSPORINE (RESTASIS) 0.05 % ophthalmic emulsion Place 1 drop into both eyes 2 (two) times daily.     Historical Provider,  MD  DULoxetine (CYMBALTA) 30 MG capsule Take 30 mg by mouth every morning. Uses 60 mg and 30 mg capsules    Historical Provider, MD  DULoxetine (CYMBALTA) 60 MG capsule Take 120 mg by mouth daily. Use 30 mg and 60 mg capsules 10/20/14   Historical Provider, MD  EPIPEN 2-PAK 0.3 MG/0.3ML SOAJ injection See admin instructions. 04/05/14   Historical Provider, MD  eszopiclone (LUNESTA) 2 MG TABS Take 2 mg by mouth at bedtime. Take immediately before bedtime     Historical Provider, MD  fluticasone (FLONASE) 50 MCG/ACT nasal spray Place 2 sprays into both nostrils daily. Patient taking differently: Place 1 spray into both nostrils every morning.  03/01/13   [name], MD  gabapentin (NEURONTIN) 100 MG capsule Take 100 mg by mouth every evening.     Historical Provider, MD  hydrochlorothiazide (HYDRODIURIL) 12.5 MG tablet Take 12.5 mg by mouth daily. 01/18/15   Historical Provider, MD  HYDROcodone-acetaminophen (NORCO/VICODIN) 5-325 MG tablet TAKE 1 TABLET BY MOUTH EVERY 4 TO 6 HOURS AS NEEDED FOR SEVERE BREAKTHROUGH PAIN 08/25/14   Historical Provider, MD  ibuprofen (ADVIL,MOTRIN)  200 MG tablet Take 800 mg by mouth daily as needed for headache or mild pain. Alternates between 600 mg Rx and 4-200 mg OTC    Historical Provider, MD  ibuprofen (ADVIL,MOTRIN) 600 MG tablet Take 600 mg by mouth daily as needed for headache. Alternates between 600 mg Rx and 4-200 mg OTC 08/25/14   Historical Provider, MD  ipratropium (ATROVENT) 0.03 % nasal spray 2 sprays each nostril twice a day 05/01/14   Historical Provider, MD  meclizine (ANTIVERT) 25 MG tablet Take 1 tablet by mouth daily as needed for dizziness.     Historical Provider, MD  metFORMIN (GLUCOPHAGE) 500 MG tablet Take 500 mg by mouth at bedtime.     Historical Provider, MD  nebivolol (BYSTOLIC) 10 MG tablet Take 1 tablet (10 mg total) by mouth daily. 06/19/15   Tammy S Parrett, NP  omeprazole (PRILOSEC) 20 MG capsule Take 20 mg by mouth daily.    Historical Provider, MD  promethazine (PHENERGAN) 25 MG tablet Take 25 mg by mouth every 6 (six) hours as needed for nausea.     Historical Provider, MD  traMADol (ULTRAM) 50 MG tablet Take 1-2 two times a day as needed for knee pain Patient taking differently: Take 50-100 mg by mouth daily as needed for moderate pain (for headache and knee pain).  04/20/13   Dickie La, MD  traZODone (DESYREL) 50 MG tablet Take 75 mg by mouth at bedtime.     Historical Provider, MD  ziprasidone (GEODON) 20 MG capsule Take 40 mg by mouth 3 (three) times daily after meals.     Historical Provider, MD   BP 99/65 mmHg  Pulse 59  Temp(Src) 98.1 F (36.7 C) (Oral)  Resp 16  SpO2 97% Physical Exam  Constitutional: She appears well-developed and well-nourished. No distress.  HENT:  Head: Normocephalic and atraumatic.  Eyes: Right eye exhibits no discharge. Left eye exhibits no discharge.  Cardiovascular: Normal rate, regular rhythm and intact distal pulses.   Bilateral dorsalis pedis and posterior tibialis pulses are intact. Good capillary refill to her left distal toes.  Pulmonary/Chest: Effort normal. No  respiratory distress.  Musculoskeletal: Normal range of motion. She exhibits tenderness.  Tender along lateral aspect of the left foot including left fifth toe. Slight ecchymosis to the plantar aspect of left fifth toe; no open wounds. Good cap refill.  No left calf edema.  Neurological: She is alert. Coordination normal.  Sensation is intact in her bilateral distal toes.  Skin: Skin is warm and dry. No rash noted. She is not diaphoretic. No pallor.  Psychiatric: She has a normal mood and affect. Her behavior is normal.  Nursing note and vitals reviewed.   ED Course  Procedures (including critical care time)  DIAGNOSTIC STUDIES: Oxygen Saturation is 97% on RA, normal by my interpretation.    COORDINATION OF CARE: 2:45 PM Discussed treatment plan with pt at bedside and pt agreed to plan.  Labs Review Labs Reviewed - No data to display  Imaging Review Dg Foot Complete Left  08/06/2015  CLINICAL DATA:  Left lateral foot pain.  No known injury. EXAM: LEFT FOOT - COMPLETE 3+ VIEW COMPARISON:  None. FINDINGS: The joint spaces are maintained. No significant degenerative changes. Suspect a midshaft proximal phalanx fracture of the fifth toe. There appears to be associated soft tissue swelling. Recommend correlation with area of pain and tenderness. IMPRESSION: Suspect fracture involving the proximal phalanx of the fifth toe. Electronically Signed   By: Marijo Sanes M.D.   On: 08/06/2015 14:37   I have personally reviewed and evaluated these images as part of my medical decision-making.   EKG Interpretation None      Filed Vitals:   08/06/15 1250  BP: 99/65  Pulse: 59  Temp: 98.1 F (36.7 C)  TempSrc: Oral  Resp: 16  SpO2: 97%     MDM   Meds given in ED:  Medications - No data to display  New Prescriptions   ACETAMINOPHEN (TYLENOL) 500 MG TABLET    Take 1 tablet (500 mg total) by mouth every 6 (six) hours as needed.    Final diagnoses:  Nondisplaced fracture of distal  phalanx of lesser toe of left foot, closed, initial encounter   This  is a 60 y.o. female who presents to the Emergency Department complaining of sudden onset, constant, worsening, left 5th toe pain, swelling, and ecchymosis beginning yesterday morning. She denies known injury to her left foot/left toes. She states that she woke up yesterday with mild bruising and severe pain to her left fifth toe; she reports that she experienced worsening swelling and bruising as the day progressed. Pt endorses severe pain exacerbation with bearing weight on her left foot; she has been ambulating with a cane since onset. On exam the patient has some slight ecchymosis noted to the plantar aspect of her left fifth toe. She has tenderness on her left fifth toe and left lateral foot with some mild edema. She is neurovascularly intact. X-ray shows possible fracture involving the proximal phalanx of the fifth toe. Will buddy tape and provide with postop shoe and have her follow-up with orthopedic surgeon Dr. Ninfa Linden in follow-up. Is using cane for nonweightbearing. I encouraged her to use Tylenol and ice for treatment of her pain. I discussed return precautions. I advised the patient to follow-up with their primary care provider this week. I advised the patient to return to the emergency department with new or worsening symptoms or new concerns. The patient verbalized understanding and agreement with plan.    I personally performed the services described in this documentation, which was scribed in my presence. The recorded information has been reviewed and is accurate.        Waynetta Pean, PA-C 08/06/15 1501  Leo Grosser, MD 08/07/15 [phone]

## 2015-08-09 IMAGING — CR DG CHEST 2V
2 series · 2 of 2 positions shown · non-contrast
Comparison: 12/17/2011.

CLINICAL DATA: 58-year-old female with cough for 6 months. Initial
encounter. Shortness of breath hypertension diabetes.

EXAM:
CHEST  2 VIEW

[view not recorded (1 of 2)]
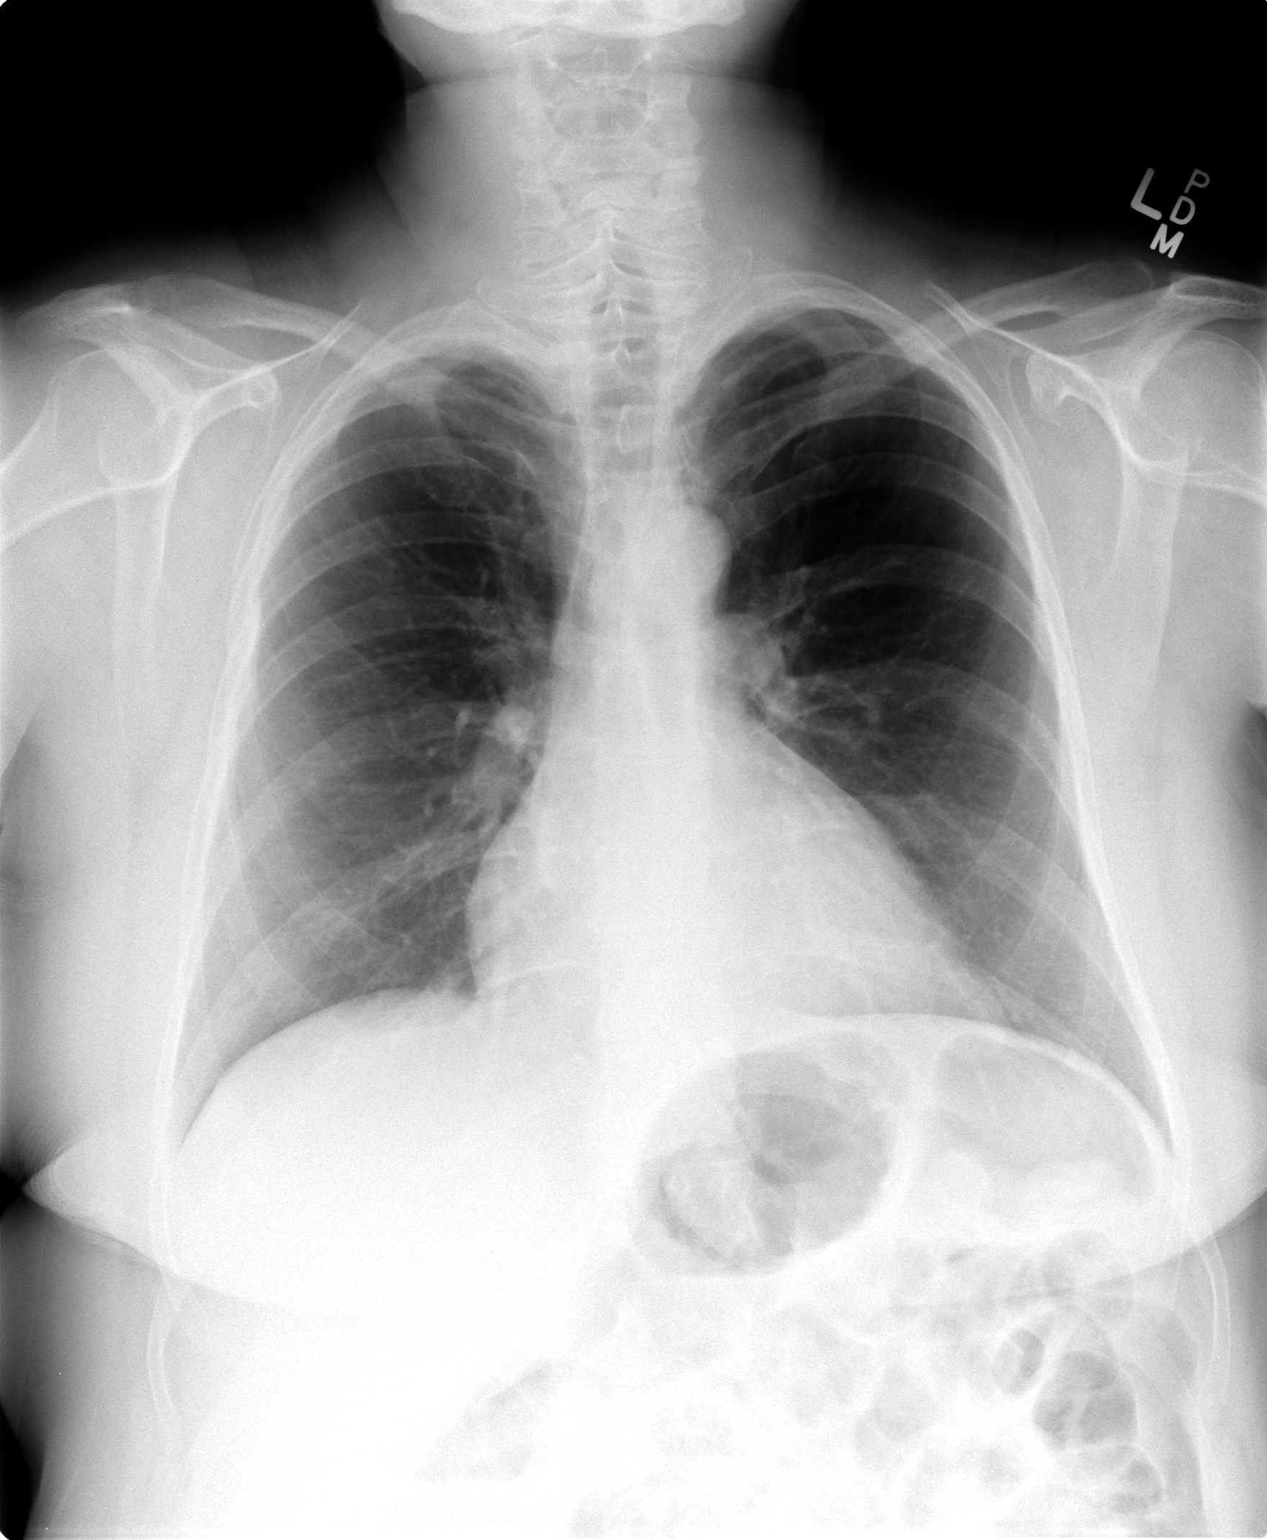

[view not recorded (2 of 2)]
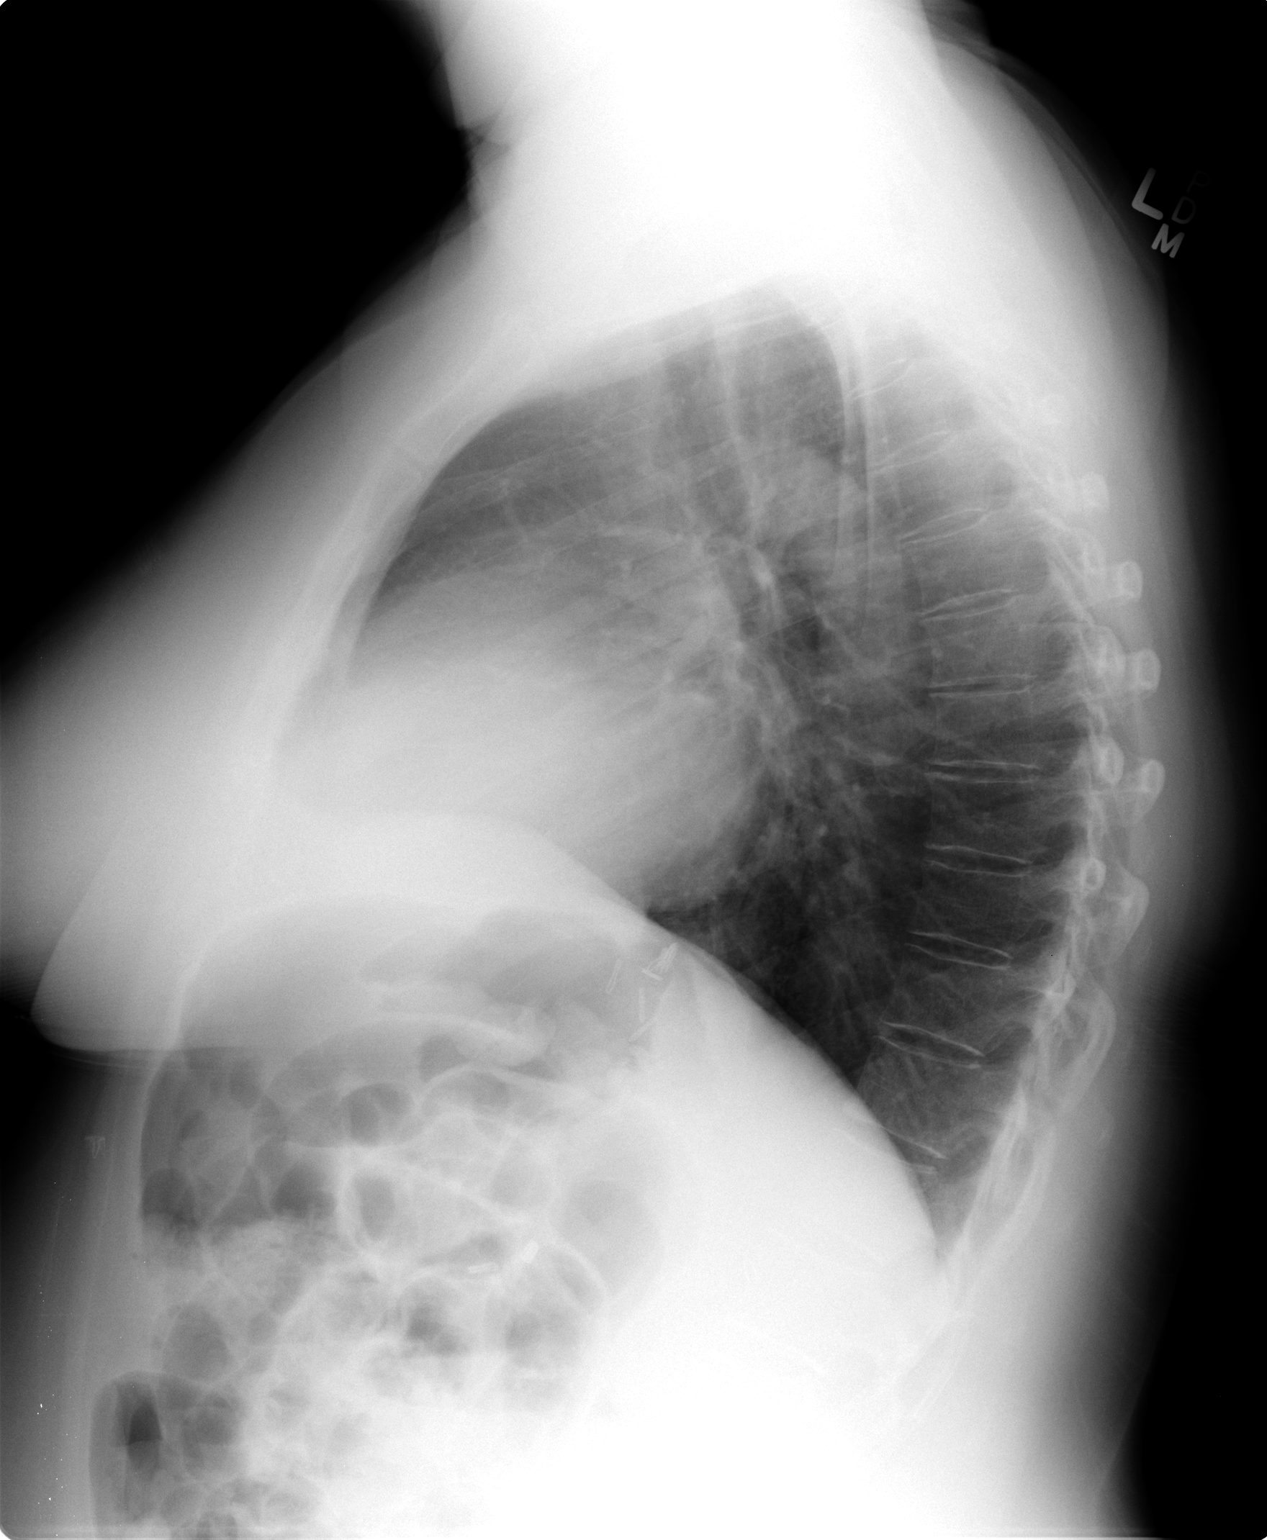

[2 of 2 positions shown; findings below may reference images not displayed]

FINDINGS: Stable lung volumes. Cardiac size is stable at the upper limits of
normal. Other mediastinal contours are within normal limits.
Visualized tracheal air column is within normal limits. No
pneumothorax, pulmonary edema, pleural effusion or confluent
pulmonary opacity. Stable abdominal surgical clips. No acute osseous
abnormality identified.
IMPRESSION: Stable and negative, no acute cardiopulmonary abnormality.

## 2015-08-21 ENCOUNTER — Encounter: Payer: Self-pay | Admitting: Family Medicine

## 2015-08-21 ENCOUNTER — Ambulatory Visit (INDEPENDENT_AMBULATORY_CARE_PROVIDER_SITE_OTHER): Payer: Medicare Other | Admitting: Family Medicine

## 2015-08-21 VITALS — Ht 60.25 in | Wt 142.6 lb

## 2015-08-21 DIAGNOSIS — R7309 Other abnormal glucose: Secondary | ICD-10-CM

## 2015-08-21 DIAGNOSIS — F509 Eating disorder, unspecified: Secondary | ICD-10-CM | POA: Diagnosis not present

## 2015-08-21 NOTE — Patient Instructions (Addendum)
-   Call Dr. Jess Barters office, and ask how long you should continue to wear the orthopedic boot.    Goals: - Get more variety in sources of protein (beans, soy burgers/hot dogs, tuna, eggs, yogurt). - Increase vegetables:  Get some at both lunch and dinner, even if it's just carrots or tomato slices.

## 2015-08-21 NOTE — Progress Notes (Signed)
Medical Nutrition Therapy:  Appt start time: 1100 end time:  1130. Therapist: Sherilyn Cooter Psychiatrist: [name], MD  Assessment:  Primary concerns today: Elevated blood glucose (pre-DM) levels (R73.09) and eating disorder (F50.9).     Becky [name]'s dad will be having toes amputated in a couple of weeks.  This is stressful to everyone in the family, and hits Becky [name] especially hard, as she is a main caregiver for them.  Also, Becky [name] broke her foot a couple weeks ago; not even sure how she did it.  She hopes to have a meeting with her siblings to talk about getting more support in caring for her parents.  She has again been spending all of her time at her parents', including overnight.    Becky [name]'s food record (http://[email or web address].info/) indicates that she is relying almost exclusively on cheese as a protein source.  In the past month, she did not have any beans, egg, or tuna (and she does not eat meat or other fish).  Over the 16 days reviewed, Becky [name] had 15 veg servings, 19 fruit, ad 15 PB&J sandwiches.    Progress:  In Progress.    Nutritional Diagnosis:  Regression on NB-1.5 Disordered eating pattern as related to weight anxiety as evidenced by consistent pattern of 3 small meals/day with only the rare meals containing vegetables.     Intervention:  Nutrition counseling.     Monitoring/Evaluation:  Dietary intake, body weight, and physical activity in 4 weeks.

## 2015-08-31 ENCOUNTER — Other Ambulatory Visit: Payer: Self-pay | Admitting: Family Medicine

## 2015-08-31 ENCOUNTER — Ambulatory Visit
Admission: RE | Admit: 2015-08-31 | Discharge: 2015-08-31 | Disposition: A | Discharge status: Home / Self Care | Payer: Medicare Other | Source: Ambulatory Visit

## 2015-08-31 DIAGNOSIS — Z1231 Encounter for screening mammogram for malignant neoplasm of breast: Secondary | ICD-10-CM

## 2015-09-18 ENCOUNTER — Encounter: Payer: Self-pay | Admitting: Family Medicine

## 2015-09-18 ENCOUNTER — Ambulatory Visit (INDEPENDENT_AMBULATORY_CARE_PROVIDER_SITE_OTHER): Payer: Medicare Other | Admitting: Family Medicine

## 2015-09-18 DIAGNOSIS — F509 Eating disorder, unspecified: Secondary | ICD-10-CM

## 2015-09-18 DIAGNOSIS — R7309 Other abnormal glucose: Secondary | ICD-10-CM | POA: Diagnosis not present

## 2015-09-18 NOTE — Progress Notes (Signed)
Medical Nutrition Therapy:  Appt start time: 1100 end time:  1130. Therapist: Sherilyn Cooter Psychiatrist: [name], MD  Assessment:  Primary concerns today: Elevated blood glucose (pre-DM) levels (R73.09) and eating disorder (F50.9).     Becky [name] has been staying at her parents most nights again, rarely getting home to her own place.  This has added to the stress of helping to care for her parents.  Her dad is in a nursing home now, having had toe amputations.    She has increased her Trazedone to help her stay asleep; typically falling asleep, but waking after a couple of hours, and getting a total of only 4-5 hours nightly, with no sleep at all about once a week.  Becky [name] said she worries about her parents and that anxiety about her weight is also keeping her awake at times.  She is still being weighed blindly, so does not know her current weight, but she expressed much distress about her perceived body weight.    24-hr recall suggests intake of ~820 kcal:  (Up at 8 AM) B (9:30 AM)-  Kem Kays: 2 c mixed fruit, water  200 Snk ( AM)-  --- L (3:30 PM)-  Soyburger on bun, salad   320 Snk ( PM)-  --- D (7 PM)-  pimiento cheese (2 T) sandwich   300 Snk ( PM)-  --- Typical day? Yes.    Progress:  In Progress.    Nutritional Diagnosis:  No progress on NB-1.5 Disordered eating pattern as related to weight anxiety as evidenced by consistent pattern of 3 small meals/day with little variety.     Intervention:  Nutrition counseling.     Monitoring/Evaluation:  Dietary intake, body weight, and physical activity in 8 weeks.

## 2015-09-18 NOTE — Patient Instructions (Addendum)
-   Get home to sleep at least 3 times a week.   - PLAN a schedule of workout days.    - Stationary Bike: 3 X wk for (as tolerated) 2 weeks, moving up to 5 X wk after that IF foot feels ok.    - Mornings ~9:00, MWF.  (At 5 days a week, plan the days ahead for each week if not consistent.) - As you increase activity, you need to increase food intake some - even if you're trying to lose weight.    - Be sure you eat at least 3 REAL meals a day.  (A real meal includes protein, starch, and veg's and/or fruit.)  - The best carb's include fiber.    - Continue to increase variety of protein foods.  Remember BEANS!

## 2015-09-30 ENCOUNTER — Other Ambulatory Visit: Payer: Self-pay | Admitting: Adult Health

## 2015-10-17 ENCOUNTER — Other Ambulatory Visit: Payer: Self-pay | Admitting: Adult Health

## 2015-10-19 ENCOUNTER — Telehealth: Payer: Self-pay | Admitting: Adult Health

## 2015-10-19 MED ORDER — NEBIVOLOL HCL 10 MG PO TABS: 10.0000 mg | ORAL_TABLET | Freq: Every day | ORAL | 0 refills | Status: DC

## 2015-10-19 NOTE — Telephone Encounter (Signed)
Called spoke with Ebony Hail from [facility]. She states the patient needs a refill on her bystolic. I explained to her that the patient has not been seen since 07/01/14 and we will give a 30 day supply but the patient will need to make an ov for future refills. She voiced understanding and had no further questions. Nothing further needed.  Rx sent.

## 2015-11-13 ENCOUNTER — Encounter: Payer: Self-pay | Admitting: Family Medicine

## 2015-11-13 ENCOUNTER — Ambulatory Visit (INDEPENDENT_AMBULATORY_CARE_PROVIDER_SITE_OTHER): Payer: Medicare Other | Admitting: Family Medicine

## 2015-11-13 DIAGNOSIS — F509 Eating disorder, unspecified: Secondary | ICD-10-CM

## 2015-11-13 NOTE — Progress Notes (Signed)
Medical Nutrition Therapy:  Appt start time: 1100 end time:  1130. Therapist: Sherilyn Cooter Psychiatrist: [name], MD  Assessment:  Primary concerns today: eating disorder (F50.9).     Becky [name] has not eaten anything for the past 3 days (except oatmeal for breakfast yesterday), a culmination of a miserable time at the beach with her sister-in-law [name] and 9-YO nephew 9/24 - 9/27.  While there, eating was not especially good, i.e., one restaurant meal she did not eat b/c it was too spicy, but she did not attempt to send it back, intimidated by the waiter.  Becky [name] included a comment in her food log on 9/26: "I wish I was home. I am being disrespected and treated like a [age] year old."  Becky [name]'s tendency in these situations is to withdraw rather than to get mad or to stand up for herself.  She had a big argument with her dad upon returning home, which has further escalated her depression and anxiety.    Food records prior to her beach trip of 9/24 showed that she was eating something 3 X day most days, although seldom getting a fruit or veg.  Many meals consisted of a pb sandwich.    Progress:  In Progress.    Nutritional Diagnosis:  No progress on NB-1.5 Disordered eating pattern as related to weight anxiety as evidenced by virtually no food intake for the past three days in response to stress.     Intervention:  Nutrition counseling.     Monitoring/Evaluation:  Dietary intake, body weight, and physical activity in 4 weeks.

## 2015-11-13 NOTE — Patient Instructions (Addendum)
Your plan: - Stay at your own condo each night, regardless of what you do during the day.  - Plan ahead for any meals eaten not at home, i.e., if having a meal at your parents', what vegetable(s) will you include; and what protein food will you have?  - What foods do you need to keep on hand?  - Your homework assignment: Make a list of your "inner knowledge & strengths."     - Email [name] your list no later Mon, Oct 9.    - Food goals: 1. Eat at least 3 REAL meals and 1-2 snacks per day.  Aim for no more than 5 hours between eating.      - A real meal = includes a protein, a starch, veg's and/or fruit.   2. Get some variety of protein foods, including BEANS.

## 2015-12-18 ENCOUNTER — Ambulatory Visit: Payer: Medicare Other | Admitting: Family Medicine

## 2015-12-25 ENCOUNTER — Ambulatory Visit (INDEPENDENT_AMBULATORY_CARE_PROVIDER_SITE_OTHER): Payer: Medicare Other | Admitting: Family Medicine

## 2015-12-25 ENCOUNTER — Encounter: Payer: Self-pay | Admitting: Family Medicine

## 2015-12-25 DIAGNOSIS — F5000 Anorexia nervosa, unspecified: Secondary | ICD-10-CM

## 2015-12-25 NOTE — Patient Instructions (Addendum)
-   Grocery shop tomorrow with list from your mom to which you have added whatever foods you need for meals and snacks, including fresh and/or frozen veg's.  Also include ingredients for the Th'giving dish you can try out this week.   Food goals:  1. Vegetables twice a day, at both lunch and dinner.  (Make sure your meals are REAL meals - include a source of protein, starch, and veg's and/or fruit.)  2. Major food prep once a week, freezing leftovers portioned for later meals (in which your mom is also involved).      Make this a Friday or Saturday, following usual Thursday shopping day.   3. Get variety in protein sources, i.e., no two days in a row of the same protein food for dinner.    - Ask your mom about her med's when you take your own each day.   - I will be in touch re. follow-up appt - most likely Dec 11 at noon.     For your dad's upcoming appt: - Keep a 3-day food record (what, how much, and what time food is consumed) of intake prior to his Dec 5 appt at 2:30.  Ask him to sign off on each day to confirm accuracy.  This needs to include all beverages and snacks.

## 2015-12-25 NOTE — Progress Notes (Signed)
Medical Nutrition Therapy:  Appt start time: 1200 end time:  1230. Therapist: Sherilyn Cooter Psychiatrist: [name], MD  Assessment:  Primary concerns today: eating disorder (F50.9).     Becky [name] has had a pretty severe URI for which she has been on a Z pack, and prednisone.  She is back living with her parents mainly b/c her mother's depression has worsened so much.  Her father has been drinking more than ever, which has made it difficult for everyone.  Becky [name] has seldom been to the grocery store b/c of feeling so sick, which shows in her food record - meals again consist mainly of pb sandwiches or cereal and milk.      Progress:  In Progress.    Nutritional Diagnosis:  No progress on NB-1.5 Disordered eating pattern as related to weight anxiety as evidenced by virtually restricted food intake (<1000 kcal/day) for past several weeks.     Intervention:  Nutrition counseling.     Monitoring/Evaluation:  Dietary intake, body weight, and physical activity in 4 weeks.

## 2016-01-15 ENCOUNTER — Ambulatory Visit: Payer: Medicare Other | Admitting: Family Medicine

## 2016-01-22 ENCOUNTER — Encounter: Payer: Self-pay | Admitting: Family Medicine

## 2016-01-22 ENCOUNTER — Ambulatory Visit (INDEPENDENT_AMBULATORY_CARE_PROVIDER_SITE_OTHER): Payer: Medicare Other | Admitting: Family Medicine

## 2016-01-22 DIAGNOSIS — F5000 Anorexia nervosa, unspecified: Secondary | ICD-10-CM | POA: Diagnosis not present
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
## 2016-01-22 NOTE — Patient Instructions (Addendum)
-   Ask your mom to make a veg-bean soup (or make it yourself).    Goals:  1. Avoid tea (and any caffeine source) any time after 2 PM.   2. Vegetables twice a day.  3. Protein with each meal; vary sources.   4. Have that conversation with your siblings about your parents' situation.

## 2016-02-26 ENCOUNTER — Ambulatory Visit (INDEPENDENT_AMBULATORY_CARE_PROVIDER_SITE_OTHER): Payer: Medicare Other | Admitting: Family Medicine

## 2016-02-26 ENCOUNTER — Encounter: Payer: Self-pay | Admitting: Family Medicine

## 2016-02-26 DIAGNOSIS — F509 Eating disorder, unspecified: Secondary | ICD-10-CM
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
## 2016-02-26 NOTE — Patient Instructions (Addendum)
-   WHENEVER YOU GO TO THE GROCERY STORE, BE SURE TO ADD YOUR OWN FOOD NEEDS TO THE LIST!  For example:   - [place] Burgers  - Canned beans  - Frozen veg's  - Peanut butter  - Cheese - Using the form provided today, add to the list as needed.  Also use this form for an ongoing list of needs.    GOALS:  - Limit tea to before noon each day, and water after that.  Record tea intake, including # of ounces, on [email or web address].[email or web address]   - Vegetables at least 2 X day (with both lunch and dinner).   - Spend at least a few minutes each weekend planning groceries you need.  When you do this, record it in [email or web address].[email or web address]    - Send an [email or web address] message to let me know your transition-to-home plan.

## 2016-03-25 ENCOUNTER — Encounter: Payer: Self-pay | Admitting: Family Medicine

## 2016-03-25 ENCOUNTER — Ambulatory Visit (INDEPENDENT_AMBULATORY_CARE_PROVIDER_SITE_OTHER): Payer: Medicare Other | Admitting: Family Medicine

## 2016-03-25 DIAGNOSIS — F509 Eating disorder, unspecified: Secondary | ICD-10-CM | POA: Diagnosis not present
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
## 2016-03-25 NOTE — Patient Instructions (Addendum)
GOALS remain the same:  - Limit tea to before noon each day, and water after that.  Record tea intake, including # of ounces, on [email or web address].[email or web address].[email or web address]   - Vegetables at least 2 X day (with both lunch and dinner).   - Spend at least a few minutes each weekend planning groceries you need.  When you do this, record it in [email or web address]    - Use the grocery planning form provided.   - Continue to record intake on [email or web address].[email or web address]

## 2016-04-22 ENCOUNTER — Ambulatory Visit (INDEPENDENT_AMBULATORY_CARE_PROVIDER_SITE_OTHER): Payer: Medicare Other | Admitting: Family Medicine

## 2016-04-22 ENCOUNTER — Encounter: Payer: Self-pay | Admitting: Family Medicine

## 2016-04-22 DIAGNOSIS — F509 Eating disorder, unspecified: Secondary | ICD-10-CM
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
## 2016-04-22 NOTE — Patient Instructions (Addendum)
GOALS:  - Limit tea to before noon each day, and water after that. Record tea intake, including # of ounces, on [email or web address].[email or web address]  - Vegetables 2 X day (with both lunch and dinner).  - Spend at least a few minutes each weekend planning groceries you need. When you do this, record it in [email or web address]              - Use the grocery planning form provided.  - Physical activity:  Walk at least 45-60 minutes 3 X wk.   - Record # of minutes in [email or web address].info/, and total minutes per week.    - Use the "5-minute rule" for days you don't feel motivated.    - Consider looking into online videos for exercise you can to at home.    - Send a weekly message in [email or web address] when you have done your planning AND shopping.    Include also:  Average # of ounces of tea you had the previous week and total minutes of exercise.     - Continue to record intake on [email or web address].info/.

## 2016-05-20 ENCOUNTER — Encounter: Payer: Self-pay | Admitting: Family Medicine

## 2016-05-20 ENCOUNTER — Ambulatory Visit (INDEPENDENT_AMBULATORY_CARE_PROVIDER_SITE_OTHER): Payer: Medicare Other | Admitting: Family Medicine

## 2016-05-20 DIAGNOSIS — F509 Eating disorder, unspecified: Secondary | ICD-10-CM | POA: Diagnosis not present

## 2016-05-20 NOTE — Patient Instructions (Addendum)
Please check to see how much cheese you are using on a cheese sandwich, i.e., 1, 2, or 3 oz?  Great job on limiting your tea intake!  Goals remain the same: 1. Veg's 2 X day.  2. Once-a-week planning for meals and shopping.      Please send a message in [email or web address] when you have done both of these.   3. Exercise: At least 30 min 3 X wk.
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
## 2016-05-21 ENCOUNTER — Encounter (INDEPENDENT_AMBULATORY_CARE_PROVIDER_SITE_OTHER): Payer: Self-pay | Admitting: Orthopedic Surgery

## 2016-05-21 ENCOUNTER — Ambulatory Visit (INDEPENDENT_AMBULATORY_CARE_PROVIDER_SITE_OTHER): Payer: Medicare Other

## 2016-05-21 ENCOUNTER — Ambulatory Visit (INDEPENDENT_AMBULATORY_CARE_PROVIDER_SITE_OTHER): Payer: Medicare Other | Admitting: Orthopedic Surgery

## 2016-05-21 VITALS — Ht 60.25 in | Wt 158.0 lb

## 2016-05-21 DIAGNOSIS — M21611 Bunion of right foot: Secondary | ICD-10-CM

## 2016-05-21 NOTE — Progress Notes (Signed)
Office Visit Note   Patient: Becky [name]           Date of Birth: 08-Sep-1955           MRN: [id number] Visit Date: 05/21/2016              Requested by: [name], MD [address] [place], [place] [address] PCP: Woody Seller, MD  Chief Complaint  Patient presents with  . Right Foot - New Evaluation    Bunion      HPI: Patient is a 61 year old woman who complains of bunion pain right great toe. She states the great toe and second toe are overlapping at this time. Despite wide shoe wear she still has pain over the medial eminence of her bunion. Patient has tried shoewear modification.  Assessment & Plan: Visit Diagnoses:  1. Bunion of great toe of right foot     Plan: Discussed with the patient risks and benefits of surgery postoperative care need to avoid driving postoperatively. Patient states she understands and wishes to proceed with surgery at this time we will set this up at her convenience.  Plan for a [employer] osteotomy of the first metatarsal possible Akin osteotomy of the great toe proximal phalanx and a Weil osteotomy of the second metatarsal for clawing of the second toe. Risks and benefits including infection neurovascular injury pain were discussed patient states she understands wish to proceed.  Follow-Up Instructions: Return in about 1 week (around 05/28/2016).   Ortho Exam  Patient is alert, oriented, no adenopathy, well-dressed, normal affect, normal respiratory effort. Patient has a normal gait. She is a good dorsalis pedis and posterior tibial pulses she has good ankle and subtalar motion. She has a bony prominence over the medial eminence of her bunion. She has good range of motion of great toenail hallux rigidus. She has slight clawing of the second toe but has no pain beneath the second metatarsal head to palpation. Radiographs shows a moderate hallux valgus deformity right great toe.  Imaging: Xr Foot Complete Right  Result Date:  05/21/2016 Three-view radiographs of the right foot shows moderate hallux valgus deformity of the great toe with a long second metatarsal. The MTP joint is congruent   Labs: Lab Results  Component Value Date   HGBA1C 5.5 09/13/2009   HGBA1C 5.5 04/06/2008    Orders:  Orders Placed This Encounter  Procedures  . XR Foot Complete Right   No orders of the defined types were placed in this encounter.    Procedures: No procedures performed  Clinical Data: No additional findings.  ROS:  All other systems negative, except as noted in the HPI. Review of Systems  Objective: Vital Signs: Ht 5' 0.25" (1.53 m)   Wt 158 lb (71.7 kg)   BMI 30.60 kg/m   Specialty Comments:  No specialty comments available.  PMFS History: Patient Active Problem List   Diagnosis Date Noted  . Bunion of great toe of right foot 05/21/2016  . Knee pain, right 11/19/2014  . Right hip pain 11/19/2014  . Chronic cough 06/15/2014  . HTN (hypertension) 06/15/2014  . Osteopenia determined by [facility] 02/18/2014  . Lumbar foraminal stenosis 12/07/2013  . Benign paroxysmal positional vertigo 03/01/2013  . Nonsuppurative otitis media, not specified as acute or chronic 03/01/2013  . 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 Diabetes mellitus 07/01/2011  . Asthma, moderate persistent 01/15/2011  . Osteoarthrosis, unspecified whether generalized or localized, involving lower leg 10/30/2009  . Backache  10/30/2009  . ORTHOSTATIC HYPOTENSION 05/17/2008  . EATING DISORDER, UNSPECIFIED 11/10/2007  . HYPERGLYCEMIA 02/23/2007  . GASTROPARESIS 04/25/2006   Past Medical History:  Diagnosis Date  . Anemia   . Anorexia nervosa   . Anxiety   . Arthritis    bilateral knees  . Asthma    mainly problems in cold weather  . Chronic pain syndrome   . Depression   . Diabetes mellitus   . Encounter for long-term (current) use of other medications   . Gastroparesis    botox injections  . GERD (gastroesophageal  reflux disease)   . History of blood transfusion   . Hyperglycemia   . Hypertension   . Migraine   . Migraine   . Multiple allergies   . Murmur, cardiac    seen cardiologist in past - no workup required  . Neuropathy ([place])    right foot  . Painful respiration   . Personality change due to conditions classified elsewhere   . Raynaud's syndrome   . Tremor   . Ventral hernia   . Vitamin D deficiency     Family History  Problem Relation Age of Onset  . Urinary tract infection Mother   . Sleep disorder Mother   . Diabetes Father   . High blood pressure Father   . Cancer - Prostate Father   . Emphysema Paternal Grandfather     was a smoker    Past Surgical History:  Procedure Laterality Date  . BOTOX INJECTION N/A 11/18/2012   Procedure: BOTOX INJECTION;  Surgeon: Missy Sabins, MD;  Location: [facility];  Service: Endoscopy;  Laterality: N/A;  . BOTOX INJECTION N/A 09/30/2013   Procedure: BOTOX INJECTION;  Surgeon: Missy Sabins, MD;  Location: [facility];  Service: Endoscopy;  Laterality: N/A;  . BOTOX INJECTION N/A 03/29/2015   Procedure: BOTOX INJECTION;  Surgeon: Teena Irani, MD;  Location: [place];  Service: Endoscopy;  Laterality: N/A;  . BREAST SURGERY    . CHOLECYSTECTOMY    . COLONOSCOPY    . COLONOSCOPY WITH PROPOFOL N/A 03/29/2015   Procedure: COLONOSCOPY WITH PROPOFOL;  Surgeon: Teena Irani, MD;  Location: [place];  Service: Endoscopy;  Laterality: N/A;  . ESOPHAGOGASTRODUODENOSCOPY  08/20/2011   Procedure: ESOPHAGOGASTRODUODENOSCOPY (EGD);  Surgeon: Missy Sabins, MD;  [facility] END[facility]COPY;  Service: Endoscopy;  Laterality: N/A;  . ESOPHAGOGASTRODUODENOSCOPY N/A 11/18/2012   Procedure: ESOPHAGOGASTRODUODENOSCOPY (EGD);  Surgeon: Missy Sabins, MD;  Location: [facility];  Service: Endoscopy;  Laterality: N/A;  . ESOPHAGOGASTRODUODENOSCOPY N/A 09/30/2013   Procedure: ESOPHAGOGASTRODUODENOSCOPY (EGD);  Surgeon: Missy Sabins, MD;  Location: [facility];   Service: Endoscopy;  Laterality: N/A;  . ESOPHAGOGASTRODUODENOSCOPY (EGD) WITH PROPOFOL N/A 03/29/2015   Procedure: ESOPHAGOGASTRODUODENOSCOPY (EGD) WITH PROPOFOL;  Surgeon: Teena Irani, MD;  Location: [place];  Service: Endoscopy;  Laterality: N/A;  . HERNIA REPAIR    . LUMBAR LAMINECTOMY/DECOMPRESSION MICRODISCECTOMY Right 12/07/2013   Procedure: LUMBAR LAMINECTOMY/DECOMPRESSION MICRODISCECTOMY RIGHT  LUMBAR FIVE-SACRAL ONE ,FORAMINOTOMY;  Surgeon: Floyce Stakes, MD;  Location: [facility];  Service: Neurosurgery;  Laterality: Right;  . NISSEN FUNDOPLICATION  [id number]   Kaylyn Lim  . PEG TUBE PLACEMENT     removed 1996  . TONSILLECTOMY AND ADENOIDECTOMY     Social History   Occupational History  . Disabled Unemployed   Social History Main Topics  . Smoking status: Former Smoker    Packs/day: 0.50    Years: 2.00    Types: Cigarettes    Quit  date: 02/11/1993  . Smokeless tobacco: Never Used  . Alcohol use No     Comment: quit ETOH in 1982  . Drug use: No  . Sexual activity: Not on file

## 2016-05-31 ENCOUNTER — Telehealth (INDEPENDENT_AMBULATORY_CARE_PROVIDER_SITE_OTHER): Payer: Self-pay | Admitting: Orthopedic Surgery

## 2016-05-31 NOTE — Telephone Encounter (Signed)
Patient called asked if she will need crutches after her surgery. The number to contact patient is   [phone]

## 2016-06-03 NOTE — Telephone Encounter (Signed)
Advised by [name] that ok for patient to pick these up in the office she is approximately 51ft tall.

## 2016-06-04 ENCOUNTER — Inpatient Hospital Stay (INDEPENDENT_AMBULATORY_CARE_PROVIDER_SITE_OTHER): Payer: Medicare Other | Admitting: Orthopedic Surgery

## 2016-06-04 DIAGNOSIS — M2041 Other hammer toe(s) (acquired), right foot: Secondary | ICD-10-CM | POA: Diagnosis not present

## 2016-06-04 DIAGNOSIS — M2011 Hallux valgus (acquired), right foot: Secondary | ICD-10-CM | POA: Diagnosis not present

## 2016-06-12 ENCOUNTER — Encounter (INDEPENDENT_AMBULATORY_CARE_PROVIDER_SITE_OTHER): Payer: Self-pay | Admitting: Orthopedic Surgery

## 2016-06-12 ENCOUNTER — Ambulatory Visit (INDEPENDENT_AMBULATORY_CARE_PROVIDER_SITE_OTHER): Payer: Medicare Other | Admitting: Orthopedic Surgery

## 2016-06-12 VITALS — Ht 60.0 in | Wt 158.0 lb

## 2016-06-12 DIAGNOSIS — M21611 Bunion of right foot: Secondary | ICD-10-CM

## 2016-06-12 NOTE — Progress Notes (Signed)
Office Visit Note   Patient: Becky [name]           Date of Birth: 10-24-1955           MRN: [id number] Visit Date: 06/12/2016              Requested by: [name], MD [address] [place], [place] [address] PCP: Woody Seller, MD  Chief Complaint  Patient presents with  . Right Foot - Routine Post Op    8 days post op weil osteotomy right 2nd MT bunionectomy      HPI: Patient is one week status post right foot Chevron osteotomy and second toe while osteotomy. She is doing well no complaints of pain.  Assessment & Plan: Visit Diagnoses:  1. Bunion of great toe of right foot     Plan: We'll have her start Dial soap cleansing daily continue crutches nonweightbearing antibiotic ointment for pin tract care follow-up in 1 week to remove the pin and harvest the sutures and begin weightbearing.   2 view radiographs of the right foot follow-up.  Follow-Up Instructions: Return in about 1 week (around 06/19/2016).   Ortho Exam  Patient is alert, oriented, no adenopathy, well-dressed, normal affect, normal respiratory effort. Examination there is some swelling there is no floating of the second toe she has well opposed wound edges with no cellulitis no drainage no signs of infection.  Imaging: No results found.  Labs: Lab Results  Component Value Date   HGBA1C 5.5 09/13/2009   HGBA1C 5.5 04/06/2008    Orders:  No orders of the defined types were placed in this encounter.  No orders of the defined types were placed in this encounter.    Procedures: No procedures performed  Clinical Data: No additional findings.  ROS:  All other systems negative, except as noted in the HPI. Review of Systems  Objective: Vital Signs: Ht 5' (1.524 m)   Wt 158 lb (71.7 kg)   BMI 30.86 kg/m   Specialty Comments:  No specialty comments available.  PMFS History: Patient Active Problem List   Diagnosis Date Noted  . Bunion of great toe of right foot  05/21/2016  . Knee pain, right 11/19/2014  . Right hip pain 11/19/2014  . Chronic cough 06/15/2014  . HTN (hypertension) 06/15/2014  . Osteopenia determined by [facility] 02/18/2014  . Lumbar foraminal stenosis 12/07/2013  . Benign paroxysmal positional vertigo 03/01/2013  . Nonsuppurative otitis media, not specified as acute or chronic 03/01/2013  . 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 Diabetes mellitus 07/01/2011  . Asthma, moderate persistent 01/15/2011  . Osteoarthrosis, unspecified whether generalized or localized, involving lower leg 10/30/2009  . Backache 10/30/2009  . ORTHOSTATIC HYPOTENSION 05/17/2008  . EATING DISORDER, UNSPECIFIED 11/10/2007  . HYPERGLYCEMIA 02/23/2007  . GASTROPARESIS 04/25/2006   Past Medical History:  Diagnosis Date  . Anemia   . Anorexia nervosa   . Anxiety   . Arthritis    bilateral knees  . Asthma    mainly problems in cold weather  . Chronic pain syndrome   . Depression   . Diabetes mellitus   . Encounter for long-term (current) use of other medications   . Gastroparesis    botox injections  . GERD (gastroesophageal reflux disease)   . History of blood transfusion   . Hyperglycemia   . Hypertension   . Migraine   . Migraine   . Multiple allergies   . Murmur, cardiac    seen cardiologist  in past - no workup required  . Neuropathy    right foot  . Painful respiration   . Personality change due to conditions classified elsewhere   . Raynaud's syndrome   . Tremor   . Ventral hernia   . Vitamin D deficiency     Family History  Problem Relation Age of Onset  . Urinary tract infection Mother   . Sleep disorder Mother   . Diabetes Father   . High blood pressure Father   . Cancer - Prostate Father   . Emphysema Paternal Grandfather     was a smoker    Past Surgical History:  Procedure Laterality Date  . BOTOX INJECTION N/A 11/18/2012   Procedure: BOTOX INJECTION;  Surgeon: Missy Sabins, MD;  Location: [facility];  Service:  Endoscopy;  Laterality: N/A;  . BOTOX INJECTION N/A 09/30/2013   Procedure: BOTOX INJECTION;  Surgeon: Missy Sabins, MD;  Location: [facility];  Service: Endoscopy;  Laterality: N/A;  . BOTOX INJECTION N/A 03/29/2015   Procedure: BOTOX INJECTION;  Surgeon: Teena Irani, MD;  Location: [place];  Service: Endoscopy;  Laterality: N/A;  . BREAST SURGERY    . CHOLECYSTECTOMY    . COLONOSCOPY    . COLONOSCOPY WITH PROPOFOL N/A 03/29/2015   Procedure: COLONOSCOPY WITH PROPOFOL;  Surgeon: Teena Irani, MD;  Location: [place];  Service: Endoscopy;  Laterality: N/A;  . ESOPHAGOGASTRODUODENOSCOPY  08/20/2011   Procedure: ESOPHAGOGASTRODUODENOSCOPY (EGD);  Surgeon: Missy Sabins, MD;  Location[facility]COPY;  Service: Endoscopy;  Laterality: N/A;  . ESOPHAGOGASTRODUODENOSCOPY N/A 11/18/2012   Procedure: ESOPHAGOGASTRODUODENOSCOPY (EGD);  Surgeon: Missy Sabins, MD;  Location: [facility];  Service: Endoscopy;  Laterality: N/A;  . ESOPHAGOGASTRODUODENOSCOPY N/A 09/30/2013   Procedure: ESOPHAGOGASTRODUODENOSCOPY (EGD);  Surgeon: Missy Sabins, MD;  Location: [facility];  Service: Endoscopy;  Laterality: N/A;  . ESOPHAGOGASTRODUODENOSCOPY (EGD) WITH PROPOFOL N/A 03/29/2015   Procedure: ESOPHAGOGASTRODUODENOSCOPY (EGD) WITH PROPOFOL;  Surgeon: Teena Irani, MD;  Location: [place];  Service: Endoscopy;  Laterality: N/A;  . HERNIA REPAIR    . LUMBAR LAMINECTOMY/DECOMPRESSION MICRODISCECTOMY Right 12/07/2013   Procedure: LUMBAR LAMINECTOMY/DECOMPRESSION MICRODISCECTOMY RIGHT  LUMBAR FIVE-SACRAL ONE ,FORAMINOTOMY;  Surgeon: Floyce Stakes, MD;  Location: [facility];  Service: Neurosurgery;  Laterality: Right;  . NISSEN FUNDOPLICATION  [id number]   Kaylyn Lim  . PEG TUBE PLACEMENT     removed 1996  . TONSILLECTOMY AND ADENOIDECTOMY     Social History   Occupational History  . Disabled Unemployed   Social History Main Topics  . Smoking status: Former Smoker    Packs/day: 0.50    Years: 2.00    Types:  Cigarettes    Quit date: 02/11/1993  . Smokeless tobacco: Never Used  . Alcohol use No     Comment: quit ETOH in 1982  . Drug use: No  . Sexual activity: Not on file

## 2016-06-17 ENCOUNTER — Ambulatory Visit: Payer: Medicare Other | Admitting: Family Medicine

## 2016-06-18 ENCOUNTER — Ambulatory Visit (INDEPENDENT_AMBULATORY_CARE_PROVIDER_SITE_OTHER): Payer: Medicare Other | Admitting: Orthopedic Surgery

## 2016-06-18 ENCOUNTER — Encounter (INDEPENDENT_AMBULATORY_CARE_PROVIDER_SITE_OTHER): Payer: Self-pay | Admitting: Orthopedic Surgery

## 2016-06-18 VITALS — Ht 60.0 in | Wt 158.0 lb

## 2016-06-18 DIAGNOSIS — M21611 Bunion of right foot: Secondary | ICD-10-CM

## 2016-06-18 NOTE — Progress Notes (Signed)
Office Visit Note   Patient: Becky [name]           Date of Birth: 1956/01/20           MRN: [id number] Visit Date: 06/18/2016              Requested by: [name], [address] [place], [place] [address] PCP: [name], MD  Chief Complaint  Patient presents with  . Right Foot - Routine Post Op    2 weeks s/p right foot chevron and 2nd toe Weil osteotomy       HPI: Patient presents 2 weeks status post chevron and Weil osteotomy right foot. She has no complaints.  Assessment & Plan: Visit Diagnoses:  1. Bunion of great toe of right foot     Plan: The pin was removed Band-Aids were applied she will start moisturizing lotion daily washing with soap and water daily wearing a regular sock and a postoperative shoe weightbearing as tolerated follow up in 2 weeks.  Follow-Up Instructions: Return in about 2 weeks (around 07/02/2016).   Ortho Exam  Patient is alert, oriented, no adenopathy, well-dressed, normal affect, normal respiratory effort. Examination incision is clean and dry there is some swelling the sutures are harvested the pin is removed she has no complications. A pad was placed in the first webspace.  Imaging: No results found.  Labs: Lab Results  Component Value Date   HGBA1C 5.5 09/13/2009   HGBA1C 5.5 04/06/2008    Orders:  No orders of the defined types were placed in this encounter.  No orders of the defined types were placed in this encounter.    Procedures: No procedures performed  Clinical Data: No additional findings.  ROS:  All other systems negative, except as noted in the HPI. Review of Systems  Objective: Vital Signs: Ht 5' (1.524 m)   Wt 158 lb (71.7 kg)   BMI 30.86 kg/m   Specialty Comments:  No specialty comments available.  PMFS History: Patient Active Problem List   Diagnosis Date Noted  . Bunion of great toe of right foot 05/21/2016  . Knee pain, right 11/19/2014  . Right hip pain 11/19/2014    . Chronic cough 06/15/2014  . HTN (hypertension) 06/15/2014  . Osteopenia determined by [facility] 02/18/2014  . Lumbar foraminal stenosis 12/07/2013  . Benign paroxysmal positional vertigo 03/01/2013  . Nonsuppurative otitis media, not specified as acute or chronic 03/01/2013  . 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 Diabetes mellitus 07/01/2011  . Asthma, moderate persistent 01/15/2011  . Osteoarthrosis, unspecified whether generalized or localized, involving lower leg 10/30/2009  . Backache 10/30/2009  . ORTHOSTATIC HYPOTENSION 05/17/2008  . EATING DISORDER, UNSPECIFIED 11/10/2007  . HYPERGLYCEMIA 02/23/2007  . GASTROPARESIS 04/25/2006   Past Medical History:  Diagnosis Date  . Anemia   . Anorexia nervosa   . Anxiety   . Arthritis    bilateral knees  . Asthma    mainly problems in cold weather  . Chronic pain syndrome   . Depression   . Diabetes mellitus   . Encounter for long-term (current) use of other medications   . Gastroparesis    botox injections  . GERD (gastroesophageal reflux disease)   . History of blood transfusion   . Hyperglycemia   . Hypertension   . Migraine   . Migraine   . Multiple allergies   . Murmur, cardiac    seen cardiologist in past - no workup required  .  Neuropathy    right foot  . Painful respiration   . Personality change due to conditions classified elsewhere   . Raynaud's syndrome   . Tremor   . Ventral hernia   . Vitamin D deficiency     Family History  Problem Relation Age of Onset  . Urinary tract infection Mother   . Sleep disorder Mother   . Diabetes Father   . High blood pressure Father   . Cancer - Prostate Father   . Emphysema Paternal Grandfather     was a smoker    Past Surgical History:  Procedure Laterality Date  . BOTOX INJECTION N/A 11/18/2012   Procedure: BOTOX INJECTION;  Surgeon: Missy Sabins, MD;  [facility]: [facility];  Service: Endoscopy;  Laterality: N/A;  . BOTOX INJECTION N/A 09/30/2013    Procedure: BOTOX INJECTION;  Surgeon: Missy Sabins, MD;  Location: [facility];  Service: Endoscopy;  Laterality: N/A;  . BOTOX INJECTION N/A 03/29/2015   Procedure: BOTOX INJECTION;  Surgeon: Teena Irani, MD;  Location: [place];  Service: Endoscopy;  Laterality: N/A;  . BREAST SURGERY    . CHOLECYSTECTOMY    . COLONOSCOPY    . COLONOSCOPY WITH PROPOFOL N/A 03/29/2015   Procedure: COLONOSCOPY WITH PROPOFOL;  Surgeon: Teena Irani, MD;  Location: [place];  Service: Endoscopy;  Laterality: N/A;  . ESOPHAGOGASTRODUODENOSCOPY  08/20/2011   Procedure: ESOPHAGOGASTRODUODENOSCOPY (EGD);  Surgeon: Missy Sabins, MD;  Location[facility] END[facility]COPY;  Service: Endoscopy;  Laterality: N/A;  . ESOPHAGOGASTRODUODENOSCOPY N/A 11/18/2012   Procedure: ESOPHAGOGASTRODUODENOSCOPY (EGD);  Surgeon: Missy Sabins, MD;  Location: [facility];  Service: Endoscopy;  Laterality: N/A;  . ESOPHAGOGASTRODUODENOSCOPY N/A 09/30/2013   Procedure: ESOPHAGOGASTRODUODENOSCOPY (EGD);  Surgeon: Missy Sabins, MD;  Location: [facility];  Service: Endoscopy;  Laterality: N/A;  . ESOPHAGOGASTRODUODENOSCOPY (EGD) WITH PROPOFOL N/A 03/29/2015   Procedure: ESOPHAGOGASTRODUODENOSCOPY (EGD) WITH PROPOFOL;  Surgeon: Teena Irani, MD;  Location: [place];  Service: Endoscopy;  Laterality: N/A;  . HERNIA REPAIR    . LUMBAR LAMINECTOMY/DECOMPRESSION MICRODISCECTOMY Right 12/07/2013   Procedure: LUMBAR LAMINECTOMY/DECOMPRESSION MICRODISCECTOMY RIGHT  LUMBAR FIVE-SACRAL ONE ,FORAMINOTOMY;  Surgeon: Floyce Stakes, MD;  Location: [facility];  Service: Neurosurgery;  Laterality: Right;  . NISSEN FUNDOPLICATION  [id number]   Kaylyn Lim  . PEG TUBE PLACEMENT     removed 1996  . TONSILLECTOMY AND ADENOIDECTOMY     Social History   Occupational History  . Disabled Unemployed   Social History Main Topics  . Smoking status: Former Smoker    Packs/day: 0.50    Years: 2.00    Types: Cigarettes    Quit date: 02/11/1993  . Smokeless tobacco: Never  Used  . Alcohol use No     Comment: quit ETOH in 1982  . Drug use: No  . Sexual activity: Not on file

## 2016-06-19 ENCOUNTER — Ambulatory Visit (INDEPENDENT_AMBULATORY_CARE_PROVIDER_SITE_OTHER): Payer: Medicare Other | Admitting: Orthopedic Surgery

## 2016-06-20 ENCOUNTER — Ambulatory Visit (INDEPENDENT_AMBULATORY_CARE_PROVIDER_SITE_OTHER): Payer: Medicare Other | Admitting: Orthopedic Surgery

## 2016-06-24 ENCOUNTER — Ambulatory Visit (INDEPENDENT_AMBULATORY_CARE_PROVIDER_SITE_OTHER): Payer: Medicare Other | Admitting: Orthopedic Surgery

## 2016-06-24 ENCOUNTER — Encounter (INDEPENDENT_AMBULATORY_CARE_PROVIDER_SITE_OTHER): Payer: Self-pay | Admitting: Orthopedic Surgery

## 2016-06-24 DIAGNOSIS — M21611 Bunion of right foot: Secondary | ICD-10-CM

## 2016-06-24 NOTE — Progress Notes (Signed)
Post-Op Visit Note   Patient: Becky [name]           Date of Birth: 04/14/1955           MRN: [id number] Visit Date: 06/24/2016 PCP: [name], MD  Chief Complaint:  Chief Complaint  Patient presents with  . Right Foot - Routine Post Op    HPI:  Patient presents status post chevron and Weil osteotomy right foot on 06/04/16. She has continued to use crutches and is nonweight bearing.     Ortho Exam Incisions are well healed. No open areas. No erythema, drainage or sign of infection.  Visit Diagnoses:  1. Bunion of great toe of right foot     Plan: Advance weight bearing in post op shoe as tolerated. Follow up in office in 2 weeks.   Follow-Up Instructions: No Follow-up on file.   Imaging: No results found.  Orders:  No orders of the defined types were placed in this encounter.  No orders of the defined types were placed in this encounter.    PMFS History: Patient Active Problem List   Diagnosis Date Noted  . Bunion of great toe of right foot 05/21/2016  . Knee pain, right 11/19/2014  . Right hip pain 11/19/2014  . Chronic cough 06/15/2014  . HTN (hypertension) 06/15/2014  . Osteopenia [facility] 02/18/2014  . Lumbar foraminal stenosis 12/07/2013  . Benign paroxysmal positional vertigo 03/01/2013  . Nonsuppurative otitis media, not specified as acute or chronic 03/01/2013  . 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 Diabetes mellitus 07/01/2011  . Asthma, moderate persistent 01/15/2011  . Osteoarthrosis, unspecified whether generalized or localized, involving lower leg 10/30/2009  . Backache 10/30/2009  . ORTHOSTATIC HYPOTENSION 05/17/2008  . EATING DISORDER, UNSPECIFIED 11/10/2007  . HYPERGLYCEMIA 02/23/2007  . GASTROPARESIS 04/25/2006   Past Medical History:  Diagnosis Date  . Anemia   . Anorexia nervosa   . Anxiety   . Arthritis    bilateral knees  . Asthma    mainly problems in cold weather  . Chronic pain syndrome     . Depression   . Diabetes mellitus   . Encounter for long-term (current) use of other medications   . Gastroparesis    botox injections  . GERD (gastroesophageal reflux disease)   . History of blood transfusion   . Hyperglycemia   . Hypertension   . Migraine   . Migraine   . Multiple allergies   . Murmur, cardiac    seen cardiologist in past - no workup required  . Neuropathy    right foot  . Painful respiration   . Personality change due to conditions classified elsewhere   . Raynaud's syndrome   . Tremor   . Ventral hernia   . Vitamin D deficiency     Family History  Problem Relation Age of Onset  . Urinary tract infection Mother   . Sleep disorder Mother   . Diabetes Father   . High blood pressure Father   . Cancer - Prostate Father   . Emphysema Paternal Grandfather        was a smoker    Past Surgical History:  Procedure Laterality Date  . BOTOX INJECTION N/A 11/18/2012   Procedure: BOTOX INJECTION;  Surgeon: Missy Sabins, MD;  [facility]: [facility];  Service: Endoscopy;  Laterality: N/A;  . BOTOX INJECTION N/A 09/30/2013   Procedure: BOTOX INJECTION;  Surgeon: Missy Sabins, MD;  Location: [facility];  Service: Endoscopy;  Laterality: N/A;  . BOTOX INJECTION N/A 03/29/2015   Procedure: BOTOX INJECTION;  Surgeon: Teena Irani, MD;  Location: [place];  Service: Endoscopy;  Laterality: N/A;  . BREAST SURGERY    . CHOLECYSTECTOMY    . COLONOSCOPY    . COLONOSCOPY WITH PROPOFOL N/A 03/29/2015   Procedure: COLONOSCOPY WITH PROPOFOL;  Surgeon: Teena Irani, MD;  Location: [place];  Service: Endoscopy;  Laterality: N/A;  . ESOPHAGOGASTRODUODENOSCOPY  08/20/2011   Procedure: ESOPHAGOGASTRODUODENOSCOPY (EGD);  Surgeon: Missy Sabins, MD;  Location[facility];  Service: Endoscopy;  Laterality: N/A;  . ESOPHAGOGASTRODUODENOSCOPY N/A 11/18/2012   Procedure: ESOPHAGOGASTRODUODENOSCOPY (EGD);  Surgeon: Missy Sabins, MD;  Location: [facility];  Service: Endoscopy;   Laterality: N/A;  . ESOPHAGOGASTRODUODENOSCOPY N/A 09/30/2013   Procedure: ESOPHAGOGASTRODUODENOSCOPY (EGD);  Surgeon: Missy Sabins, MD;  Location: [facility];  Service: Endoscopy;  Laterality: N/A;  . ESOPHAGOGASTRODUODENOSCOPY (EGD) WITH PROPOFOL N/A 03/29/2015   Procedure: ESOPHAGOGASTRODUODENOSCOPY (EGD) WITH PROPOFOL;  Surgeon: Teena Irani, MD;  Location: [place];  Service: Endoscopy;  Laterality: N/A;  . HERNIA REPAIR    . LUMBAR LAMINECTOMY/DECOMPRESSION MICRODISCECTOMY Right 12/07/2013   Procedure: LUMBAR LAMINECTOMY/DECOMPRESSION MICRODISCECTOMY RIGHT  LUMBAR FIVE-SACRAL ONE ,FORAMINOTOMY;  Surgeon: Floyce Stakes, MD;  Location: [facility];  Service: Neurosurgery;  Laterality: Right;  . NISSEN FUNDOPLICATION  [id number]   Kaylyn Lim  . PEG TUBE PLACEMENT     removed 1996  . TONSILLECTOMY AND ADENOIDECTOMY     Social History   Occupational History  . Disabled Unemployed   Social History Main Topics  . Smoking status: Former Smoker    Packs/day: 0.50    Years: 2.00    Types: Cigarettes    Quit date: 02/11/1993  . Smokeless tobacco: Never Used  . Alcohol use No     Comment: quit ETOH in 1982  . Drug use: No  . Sexual activity: Not on file

## 2016-07-02 ENCOUNTER — Encounter: Payer: Self-pay | Admitting: Family Medicine

## 2016-07-02 ENCOUNTER — Ambulatory Visit (INDEPENDENT_AMBULATORY_CARE_PROVIDER_SITE_OTHER): Payer: Medicare Other | Admitting: Orthopedic Surgery

## 2016-07-02 ENCOUNTER — Ambulatory Visit (INDEPENDENT_AMBULATORY_CARE_PROVIDER_SITE_OTHER): Payer: Medicare Other | Admitting: Family Medicine

## 2016-07-02 DIAGNOSIS — F509 Eating disorder, unspecified: Secondary | ICD-10-CM

## 2016-07-02 NOTE — Patient Instructions (Addendum)
Let's get back to previous goals: 1. Veg's 2 X day.  2. Once-a-week planning for meals and shopping.      Please send a message in [email or web address] when you have done both of these.   3. Obtain enough protein each day, aiming for 55 grams.    - 7 g of protein in: 1 large egg, 1 oz fish, 1/2 cup of most beans, 1 oz cheese  - 8 grams of protein in: 1 cup of milk, 2 Tbsp peanut butter  - Yogurt, soy products:  Check the label.
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
## 2016-07-10 ENCOUNTER — Ambulatory Visit (INDEPENDENT_AMBULATORY_CARE_PROVIDER_SITE_OTHER): Payer: Medicare Other | Admitting: Orthopedic Surgery

## 2016-07-18 ENCOUNTER — Encounter (INDEPENDENT_AMBULATORY_CARE_PROVIDER_SITE_OTHER): Payer: Self-pay | Admitting: Orthopedic Surgery

## 2016-07-18 ENCOUNTER — Telehealth (INDEPENDENT_AMBULATORY_CARE_PROVIDER_SITE_OTHER): Payer: Self-pay

## 2016-07-18 ENCOUNTER — Ambulatory Visit (INDEPENDENT_AMBULATORY_CARE_PROVIDER_SITE_OTHER): Payer: Medicare Other | Admitting: Orthopedic Surgery

## 2016-07-18 VITALS — Ht 60.0 in | Wt 156.0 lb

## 2016-07-18 DIAGNOSIS — M21611 Bunion of right foot: Secondary | ICD-10-CM

## 2016-07-18 NOTE — Progress Notes (Signed)
Office Visit Note   Patient: Becky [name]           Date of Birth: 11/02/1955           MRN: [id number] Visit Date: 07/18/2016              Requested by: [name], [address] [place], [place] [address] PCP: [name], MD  Chief Complaint  Patient presents with  . Right Foot - Routine Post Op    06/04/16 right foot chevron and weil osteotomy       HPI: Patient presents 5 weeks status post Chevron osteotomy for bunion right great toe. Weil osteotomy for clawing of the second toe.  Assessment & Plan: Visit Diagnoses:  1. Bunion of great toe of right foot     Plan: She will advance to lace of sneakers to allow for room for swelling a pad was provided to provide space in the first webspace and patient was given instructions for scar massage and stretching of the second toe.  Follow-Up Instructions: Return in about 4 weeks (around 08/15/2016).   Ortho Exam  Patient is alert, oriented, no adenopathy, well-dressed, normal affect, normal respiratory effort. Examination she does have some swelling in the forefoot there is no redness no cellulitis no drainage no signs of infection.  Imaging: No results found.  Labs: Lab Results  Component Value Date   HGBA1C 5.5 09/13/2009   HGBA1C 5.5 04/06/2008    Orders:  No orders of the defined types were placed in this encounter.  No orders of the defined types were placed in this encounter.    Procedures: No procedures performed  Clinical Data: No additional findings.  ROS:  All other systems negative, except as noted in the HPI. Review of Systems  Objective: Vital Signs: Ht 5' (1.524 m)   Wt 156 lb (70.8 kg)   BMI 30.47 kg/m   Specialty Comments:  No specialty comments available.  PMFS History: Patient Active Problem List   Diagnosis Date Noted  . Bunion of great toe of right foot 05/21/2016  . Knee pain, right 11/19/2014  . Right hip pain 11/19/2014  . Chronic cough 06/15/2014  .  HTN (hypertension) 06/15/2014  . Osteopenia determined by [facility] 02/18/2014  . Lumbar foraminal stenosis 12/07/2013  . Benign paroxysmal positional vertigo 03/01/2013  . Nonsuppurative otitis media, not specified as acute or chronic 03/01/2013  . 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 Diabetes mellitus 07/01/2011  . Asthma, moderate persistent 01/15/2011  . Osteoarthrosis, unspecified whether generalized or localized, involving lower leg 10/30/2009  . Backache 10/30/2009  . ORTHOSTATIC HYPOTENSION 05/17/2008  . EATING DISORDER, UNSPECIFIED 11/10/2007  . HYPERGLYCEMIA 02/23/2007  . GASTROPARESIS 04/25/2006   Past Medical History:  Diagnosis Date  . Anemia   . Anorexia nervosa   . Anxiety   . Arthritis    bilateral knees  . Asthma    mainly problems in cold weather  . Chronic pain syndrome   . Depression   . Diabetes mellitus   . Encounter for long-term (current) use of other medications   . Gastroparesis    botox injections  . GERD (gastroesophageal reflux disease)   . History of blood transfusion   . Hyperglycemia   . Hypertension   . Migraine   . Migraine   . Multiple allergies   . Murmur, cardiac    seen cardiologist in past - no workup required  . Neuropathy    right foot  .  Painful respiration   . Personality change due to conditions classified elsewhere   . Raynaud's syndrome   . Tremor   . Ventral hernia   . Vitamin D deficiency     Family History  Problem Relation Age of Onset  . Urinary tract infection Mother   . Sleep disorder Mother   . Diabetes Father   . High blood pressure Father   . Cancer - Prostate Father   . Emphysema Paternal Grandfather        was a smoker    Past Surgical History:  Procedure Laterality Date  . BOTOX INJECTION N/A 11/18/2012   Procedure: BOTOX INJECTION;  Surgeon: Missy Sabins, MD;  Location: [facility];  Service: Endoscopy;  Laterality: N/A;  . BOTOX INJECTION N/A 09/30/2013   Procedure: BOTOX INJECTION;   Surgeon: Missy Sabins, MD;  Location: [facility];  Service: Endoscopy;  Laterality: N/A;  . BOTOX INJECTION N/A 03/29/2015   Procedure: BOTOX INJECTION;  Surgeon: Teena Irani, MD;  Location: [place];  Service: Endoscopy;  Laterality: N/A;  . BREAST SURGERY    . CHOLECYSTECTOMY    . COLONOSCOPY    . COLONOSCOPY WITH PROPOFOL N/A 03/29/2015   Procedure: COLONOSCOPY WITH PROPOFOL;  Surgeon: Teena Irani, MD;  Location: [place];  Service: Endoscopy;  Laterality: N/A;  . ESOPHAGOGASTRODUODENOSCOPY  08/20/2011   Procedure: ESOPHAGOGASTRODUODENOSCOPY (EGD);  Surgeon: Missy Sabins, MD;  Location: [facility];  Service: Endoscopy;  Laterality: N/A;  . ESOPHAGOGASTRODUODENOSCOPY N/A 11/18/2012   Procedure: ESOPHAGOGASTRODUODENOSCOPY (EGD);  Surgeon: Missy Sabins, MD;  Location: [facility];  Service: Endoscopy;  Laterality: N/A;  . ESOPHAGOGASTRODUODENOSCOPY N/A 09/30/2013   Procedure: ESOPHAGOGASTRODUODENOSCOPY (EGD);  Surgeon: Missy Sabins, MD;  Location: [facility];  Service: Endoscopy;  Laterality: N/A;  . ESOPHAGOGASTRODUODENOSCOPY (EGD) WITH PROPOFOL N/A 03/29/2015   Procedure: ESOPHAGOGASTRODUODENOSCOPY (EGD) WITH PROPOFOL;  Surgeon: Teena Irani, MD;  Location: [place];  Service: Endoscopy;  Laterality: N/A;  . HERNIA REPAIR    . LUMBAR LAMINECTOMY/DECOMPRESSION MICRODISCECTOMY Right 12/07/2013   Procedure: LUMBAR LAMINECTOMY/DECOMPRESSION MICRODISCECTOMY RIGHT  LUMBAR FIVE-SACRAL ONE ,FORAMINOTOMY;  Surgeon: Floyce Stakes, MD;  Location: [facility];  Service: Neurosurgery;  Laterality: Right;  . NISSEN FUNDOPLICATION  6270   Kaylyn Lim  . PEG TUBE PLACEMENT     removed 1996  . TONSILLECTOMY AND ADENOIDECTOMY     Social History   Occupational History  . Disabled Unemployed   Social History Main Topics  . Smoking status: Former Smoker    Packs/day: 0.50    Years: 2.00    Types: Cigarettes    Quit date: 02/11/1993  . Smokeless tobacco: Never Used  . Alcohol use No      Comment: quit ETOH in 1982  . Drug use: No  . Sexual activity: Not on file

## 2016-07-18 NOTE — Telephone Encounter (Signed)
Patient would like to know is she can drive?  CB# is [phone].

## 2016-07-19 ENCOUNTER — Telehealth (INDEPENDENT_AMBULATORY_CARE_PROVIDER_SITE_OTHER): Payer: Self-pay | Admitting: Orthopedic Surgery

## 2016-07-19 NOTE — Telephone Encounter (Signed)
I called and lm on vm to advise that ok per Erin to drive. To call with questions.

## 2016-07-19 NOTE — Telephone Encounter (Signed)
Patient called to leave another number to contact her reference whether or not she can drive. The number is [phone]

## 2016-07-19 NOTE — Telephone Encounter (Signed)
This is a duplicate message will sign off on this and call pt once Dr. Sharol Given has advised.

## 2016-07-22 ENCOUNTER — Ambulatory Visit: Payer: Medicare Other | Admitting: Pharmacist

## 2016-07-22 ENCOUNTER — Ambulatory Visit: Payer: Medicare Other | Admitting: Family Medicine

## 2016-07-29 ENCOUNTER — Encounter: Payer: Self-pay | Admitting: Family Medicine

## 2016-07-29 ENCOUNTER — Ambulatory Visit (INDEPENDENT_AMBULATORY_CARE_PROVIDER_SITE_OTHER): Payer: Medicare Other | Admitting: Family Medicine

## 2016-07-29 DIAGNOSIS — F509 Eating disorder, unspecified: Secondary | ICD-10-CM

## 2016-07-29 NOTE — Progress Notes (Signed)
Medical Nutrition Therapy:  Appt start time: 1200 end time:  1230. Therapist: [employer] Psychiatrist: [name], MD  Assessment:  Primary concerns today: eating disorder (F50.9).   Becky [name]'s foot seems to be healing (from bunion surgery in April), but still has swelling; can't wear a shoe yet.  She has a f/u appt with Dr. Sharol Given in a couple of weeks.  She hopes to go home by this weekend.  Her condo is on the second floor, and requires climbing stairs, about which she is wary with her dog on a leash.    It has continued to be stressful living at her parents'.  Sway has not always remembered to document food intake on http://www.richardson.info/, but meals she has recorded are often inadequate or poorly balanced, i.e., salad and mashed potatoes, yesterday's lunch.  Leetta has continued restricting diet tea intake to morning only.    24-hr recall suggests intake of <500 kcal:  (Up at 8:30 AM) B (9:30 AM)-  1 bagel Snk ( AM)-  64 oz diet tea L (1:45 PM)-  1 large side salad, 1/2 c mashed potatoes, water Snk ( PM)-  --- D ( PM)-  --- Snk ( PM)-  --- Typical day? Yes.  although many days include 3 meals.   Progress:  In Progress.    Nutritional Diagnosis:  No progress on NB-1.5 Disordered eating pattern as related to weight anxiety as evidenced by continued restricted food intake (<1000 kcal/day) with very little variety in meals.     Intervention:  Nutrition counseling.     Monitoring/Evaluation:  Dietary intake, body weight, and physical activity in 6 weeks.

## 2016-07-29 NOTE — Patient Instructions (Signed)
PLAN:  1. Move back home no later than Saturday, 6/23.  2. Go the grocery store before this day (Friday).   Goals: 1. Veg's twice daily.  2. At least 55 grams of protein daily.   3. Record intake on http://www.richardson.info/, including any non-water beverage.    - And: Girls' night out!  No later than Friday, July 6.   - Call usual participants to set location, date, and time.

## 2016-08-09 ENCOUNTER — Encounter: Payer: Self-pay | Admitting: Pharmacist

## 2016-08-09 ENCOUNTER — Ambulatory Visit (INDEPENDENT_AMBULATORY_CARE_PROVIDER_SITE_OTHER): Payer: Medicare Other | Admitting: Pharmacist

## 2016-08-09 DIAGNOSIS — I10 Essential (primary) hypertension: Secondary | ICD-10-CM | POA: Diagnosis not present

## 2016-08-09 NOTE — Patient Instructions (Addendum)
For your bllod pressure, please consider trial OFF of amlodipine 5mg  due to your diziziness and well controlled blood pressure., if your blood pressure increases we can try the lower dose of 2.5mg .  Try to increase you topiramate to 1 of the 25mg  tablets TWICE daily.   See if this further reduces your headache frequency.   Take Omeprazole 20mg  once daily and consider a trial of ranitidine 150mg  as a alternative.   Try adding the ranitidine for a week then try to decrease the omeprazole and observe for GERD.   Gabapentin - Consider decreasing to as needed use one capsule only and then consider stopping.   Clonazepam - Consider decreasing from three pills per day to just two - take 1 pill twice daily ~ 12 hours apart.   Send request for refill for your Epi-Pen.

## 2016-08-09 NOTE — Progress Notes (Signed)
    S:     Chief Complaint  Patient presents with  . Medication Management    Review of Medications    Patient arrives in good spirits, walking slowly without assistance, states she had foot surgery in 2 months ago and is now just starting to walk in shoes again. Presents for comprehensive medication review at the request of Dr. Jenne Campus, previous medication review was performed in June 2016 resulted in a purge of her home medication supply and several medication clarifications.  Patient is managed by Kathryne Eriksson, MD.   Psych medications is managed by Dr. Albertine Patricia.   Family/Social History:  Currently living with father as she is unable to live in her second story apartment post foot surgery.   Patient reports adherence with most medications prescribed.   O:  Physical Exam  Constitutional: She appears well-developed.  Vitals reviewed.  ROS - Right eye ptosis.  Foot pain - 5/10 Right    Lab Results  Component Value Date   HGBA1C 5.5 09/13/2009   Vitals:   08/09/16 1138  BP: 92/62  Pulse: 66     A/P: Following medication review of all bottles.  The following considerations for change were suggested.   If these are acceptabel with her PCPs, which I believe they should be as they are dose adjustments of current therapy.   Suggested:  For your bllod pressure, please consider trial OFF of amlodipine 5mg  due to your diziziness and well controlled blood pressure., if your blood pressure increases we can try the lower dose of 2.5mg .  Try to increase you topiramate to 1 of the 25mg  tablets TWICE daily.   For further reduction in headache frequency.  Goal was described as less than 10 days per month of NSAID use for headach.   Take Omeprazole 20mg  once daily and consider a trial of ranitidine 150mg  as a alternative.   Try adding the ranitidine for a week then try to decrease the omeprazole and observe for GERD.   Gabapentin - Consider decreasing to as needed use one capsule only and  then consider stopping.   Clonazepam - Consider decreasing from three pills per day to just two - take 1 pill twice daily ~ 12 hours apart.   Send request for refill for your Epi-Pen.    Removed from patient's possession:  Promethazine 25mg  #15 Ibuprofen 600mg  #17 Hydrocodone/ APAP  5/325mg  #45 Hydrocodone/APAP  10/325mg   #81 Last two were destroyed onsite - observed by April Zimmerman, CMA Others were sent for destruction at police dept.   Written patient instructions provided.  Total time in face to face counseling 55 minutes.   Follow up in Pharmacist Clinic Visit PRN.   Patient seen with no students.

## 2016-08-09 NOTE — Assessment & Plan Note (Signed)
For your bllod pressure, please consider trial OFF of amlodipine 5mg  due to your diziziness and well controlled blood pressure., if your blood pressure increases we can try the lower dose of 2.5mg .

## 2016-08-12 NOTE — Progress Notes (Signed)
Patient ID: Becky [name], female   DOB: Jan 19, 1956, 61 y.o.   MRN: [id number] Reviewed: Agree with Dr. Graylin Shiver documentation and management.

## 2016-08-15 ENCOUNTER — Ambulatory Visit (INDEPENDENT_AMBULATORY_CARE_PROVIDER_SITE_OTHER): Payer: Medicare Other | Admitting: Orthopedic Surgery

## 2016-08-15 ENCOUNTER — Encounter (INDEPENDENT_AMBULATORY_CARE_PROVIDER_SITE_OTHER): Payer: Self-pay | Admitting: Orthopedic Surgery

## 2016-08-15 VITALS — Ht 60.0 in | Wt 164.0 lb

## 2016-08-15 DIAGNOSIS — M21611 Bunion of right foot: Secondary | ICD-10-CM

## 2016-08-15 NOTE — Progress Notes (Signed)
Office Visit Note   Patient: Becky [name]           Date of Birth: April 11, 1955           MRN: [id number] Visit Date: 08/15/2016              Requested by: [name], [address] [place], [place] [address] PCP: [name], MD  Chief Complaint  Patient presents with  . Right Foot - Routine Post Op    06/04/16 right great toe chevron osteotomy       HPI: Patient is a 61 year old woman who is about 9 weeks status post chevron osteotomy and Weil osteotomy for bunion and claw toe deformity. She has full weightbearing in regular shoewear. Patient states she is having some pain across the forefoot.  Assessment & Plan: Visit Diagnoses:  1. Bunion of great toe of right foot     Plan: Recommended heel cord stretching she has dorsiflexion short of neutral this was demonstrated to her. Recommended stretching of the scar tissue above the second metatarsal. A spacer was provided for the first webspace.  Follow-Up Instructions: Return in about 4 weeks (around 09/12/2016).   Ortho Exam  Patient is alert, oriented, no adenopathy, well-dressed, normal affect, normal respiratory effort. Examination incision is well-healed she does have increased pressure of the great toe on the second toe a spacer was placed she has slight floating of the second toe and she is given instructions for stretching of the scar tissue. She does have heel cord contracture with dorsiflexion short of neutral with her knee extended. Heel cord stretching was demonstrated. The incision is well-healed there is no redness no cellulitis no signs of infection.  Imaging: No results found.  Labs: Lab Results  Component Value Date   HGBA1C 5.5 09/13/2009   HGBA1C 5.5 04/06/2008    Orders:  No orders of the defined types were placed in this encounter.  No orders of the defined types were placed in this encounter.    Procedures: No procedures performed  Clinical Data: No additional  findings.  ROS:  All other systems negative, except as noted in the HPI. Review of Systems  Objective: Vital Signs: Ht 5' (1.524 m)   Wt 164 lb (74.4 kg)   BMI 32.03 kg/m   Specialty Comments:  No specialty comments available.  PMFS History: Patient Active Problem List   Diagnosis Date Noted  . Bunion of great toe of right foot 05/21/2016  . Knee pain, right 11/19/2014  . Right hip pain 11/19/2014  . Chronic cough 06/15/2014  . HTN (hypertension) 06/15/2014  . Osteopenia determined by [facility] 02/18/2014  . Lumbar foraminal stenosis 12/07/2013  . Benign paroxysmal positional vertigo 03/01/2013  . Nonsuppurative otitis media, not specified as acute or chronic 03/01/2013  . 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 Diabetes mellitus 07/01/2011  . Asthma, moderate persistent 01/15/2011  . Osteoarthrosis, unspecified whether generalized or localized, involving lower leg 10/30/2009  . Backache 10/30/2009  . ORTHOSTATIC HYPOTENSION 05/17/2008  . EATING DISORDER, UNSPECIFIED 11/10/2007  . HYPERGLYCEMIA 02/23/2007  . GASTROPARESIS 04/25/2006   Past Medical History:  Diagnosis Date  . Anemia   . Anorexia nervosa   . Anxiety   . Arthritis    bilateral knees  . Asthma    mainly problems in cold weather  . Chronic pain syndrome   . Depression   . Diabetes mellitus   . Encounter for long-term (current) use of other medications   .  Gastroparesis    botox injections  . GERD (gastroesophageal reflux disease)   . History of blood transfusion   . Hyperglycemia   . Hypertension   . Migraine   . Migraine   . Multiple allergies   . Murmur, cardiac    seen cardiologist in past - no workup required  . Neuropathy    right foot  . Painful respiration   . Personality change due to conditions classified elsewhere   . Raynaud's syndrome   . Tremor   . Ventral hernia   . Vitamin D deficiency     Family History  Problem Relation Age of Onset  . Urinary tract infection  Mother   . Sleep disorder Mother   . Diabetes Father   . High blood pressure Father   . Cancer - Prostate Father   . Emphysema Paternal Grandfather        was a smoker    Past Surgical History:  Procedure Laterality Date  . BOTOX INJECTION N/A 11/18/2012   Procedure: BOTOX INJECTION;  Surgeon: Missy Sabins, MD;  Location: [facility];  Service: Endoscopy;  Laterality: N/A;  . BOTOX INJECTION N/A 09/30/2013   Procedure: BOTOX INJECTION;  Surgeon: Missy Sabins, MD;  Location: [facility];  Service: Endoscopy;  Laterality: N/A;  . BOTOX INJECTION N/A 03/29/2015   Procedure: BOTOX INJECTION;  Surgeon: Teena Irani, MD;  Location: [place];  Service: Endoscopy;  Laterality: N/A;  . BREAST SURGERY    . CHOLECYSTECTOMY    . COLONOSCOPY    . COLONOSCOPY WITH PROPOFOL N/A 03/29/2015   Procedure: COLONOSCOPY WITH PROPOFOL;  Surgeon: Teena Irani, MD;  Location: [place];  Service: Endoscopy;  Laterality: N/A;  . ESOPHAGOGASTRODUODENOSCOPY  08/20/2011   Procedure: ESOPHAGOGASTRODUODENOSCOPY (EGD);  Surgeon: Missy Sabins, MD;  Location[facility] ENDOSCOPY;  Service: Endoscopy;  Laterality: N/A;  . ESOPHAGOGASTRODUODENOSCOPY N/A 11/18/2012   Procedure: ESOPHAGOGASTRODUODENOSCOPY (EGD);  Surgeon: Missy Sabins, MD;  Location: [facility];  Service: Endoscopy;  Laterality: N/A;  . ESOPHAGOGASTRODUODENOSCOPY N/A 09/30/2013   Procedure: ESOPHAGOGASTRODUODENOSCOPY (EGD);  Surgeon: Missy Sabins, MD;  Location: [facility];  Service: Endoscopy;  Laterality: N/A;  . ESOPHAGOGASTRODUODENOSCOPY (EGD) WITH PROPOFOL N/A 03/29/2015   Procedure: ESOPHAGOGASTRODUODENOSCOPY (EGD) WITH PROPOFOL;  Surgeon: Teena Irani, MD;  Location: [place];  Service: Endoscopy;  Laterality: N/A;  . HERNIA REPAIR    . LUMBAR LAMINECTOMY/DECOMPRESSION MICRODISCECTOMY Right 12/07/2013   Procedure: LUMBAR LAMINECTOMY/DECOMPRESSION MICRODISCECTOMY RIGHT  LUMBAR FIVE-SACRAL ONE ,FORAMINOTOMY;  Surgeon: Floyce Stakes, MD;  Location: [facility];  Service: Neurosurgery;  Laterality: Right;  . NISSEN FUNDOPLICATION  [id number]   Kaylyn Lim  . PEG TUBE PLACEMENT     removed 1996  . TONSILLECTOMY AND ADENOIDECTOMY     Social History   Occupational History  . Disabled Unemployed   Social History Main Topics  . Smoking status: Former Smoker    Packs/day: 0.50    Years: 2.00    Types: Cigarettes    Quit date: 02/11/1993  . Smokeless tobacco: Never Used  . Alcohol use No     Comment: quit ETOH in 1982  . Drug use: No  . Sexual activity: Not on file

## 2016-09-10 ENCOUNTER — Encounter: Payer: Self-pay | Admitting: Family Medicine

## 2016-09-10 ENCOUNTER — Ambulatory Visit (INDEPENDENT_AMBULATORY_CARE_PROVIDER_SITE_OTHER): Payer: Medicare Other | Admitting: Family Medicine

## 2016-09-10 DIAGNOSIS — F509 Eating disorder, unspecified: Secondary | ICD-10-CM | POA: Diagnosis not present

## 2016-09-10 NOTE — Patient Instructions (Addendum)
Goal: Eat at least 3 meals and 1 snack per day  Record intake on http://www.richardson.info/, including any non-water beverage.    Set up your bicycle trainer, and try it out at least 3 times this week.    - Ultimate goal: SOME kind of physical activity at least 20 min 3 X wk.    Contact your friends for a girls' night out in August!  Send a message on [email or web address] once you have scheduled this.

## 2016-09-10 NOTE — Progress Notes (Signed)
Medical Nutrition Therapy:  Appt start time: 1100 end time:  1130. Therapist: [employer] Psychiatrist: [name], MD Psychologist [name]   Assessment:  Primary concerns today: eating disorder (F50.9).   Tayla's foot post-bunion surgery is not doing well despite doing the recommended exercises 5 X daily.  She sees Dr. Sharol Given next week.  Meanwhile, she is unable to walk comfortably.  She has not yet tried riding a stationary bike.      Kalayla is home 4 nights a week, and spends [place], [place], and Sat at her parents'.  She is also at her parents' during the day much of the time, and [name] is now driving her mother to church every Sunday.    Sumayyah has been struggling emotionally.  She is still seeing therapist [name] every two weeks, and she thinks that some of the work they are currently doing is contributing to the current severity of her depression, as it is overwhelming.  Her food choices reflect her depression, with skipped meals on some days, and most meals being minimal and not nutritionally balanced (see recall below).    24-hr recall:  (Up at  AM) B (10:45 AM)-  Cantaloupe  Snk ( AM)-  --- L ( PM)-  --- Snk (4 PM)-  Ice cream cone D (7 PM)-  Cheese sandwich Snk ( PM)-  --- Typical day? No.  Does not usually have ice cream.  Has had "real meals" (protein, veg, starch) on some days.    Progress:  In Progress.    Nutritional Diagnosis:  Regression on NB-1.5 Disordered eating pattern as related to weight anxiety as evidenced by even more restricted food intake (<1000 kcal/day), sometimes skipping meals.     Intervention:  Nutrition counseling.     Monitoring/Evaluation:  Dietary intake, body weight, and physical activity in 4 weeks.

## 2016-09-16 ENCOUNTER — Encounter (INDEPENDENT_AMBULATORY_CARE_PROVIDER_SITE_OTHER): Payer: Self-pay | Admitting: Orthopedic Surgery

## 2016-09-16 ENCOUNTER — Ambulatory Visit (INDEPENDENT_AMBULATORY_CARE_PROVIDER_SITE_OTHER): Payer: Medicare Other | Admitting: Orthopedic Surgery

## 2016-09-16 DIAGNOSIS — M21611 Bunion of right foot: Secondary | ICD-10-CM

## 2016-09-16 NOTE — Progress Notes (Signed)
Office Visit Note   Patient: Becky [name]           Date of Birth: 1955-05-19           MRN: [id number] Visit Date: 09/16/2016              Requested by: [name], [address] [place], [place] [address] PCP: [name], MD  Chief Complaint  Patient presents with  . Right Great Toe - Follow-up      HPI: Patient is status post bunion surgery she states she's not been doing a very good job stretching her second toe she is having overlapping of the second toe and third toe at this time.  Assessment & Plan: Visit Diagnoses:  1. Bunion of great toe of right foot     Plan: Patient is given a mouse pad and demonstrated stretching for the second toe to alleviate the scar tissue dorsally. With the mouse pad in place and the interdigital spacer between the second and third toe patient has good alignment of the forefoot.  Follow-Up Instructions: Return in about 3 weeks (around 10/07/2016).   Ortho Exam  Patient is alert, oriented, no adenopathy, well-dressed, normal affect, normal respiratory effort. Examination there is no redness no cellulitis no signs of infection she is developing floating of the second toe secondary to scar tissue dorsally.  Imaging: No results found.  Labs: Lab Results  Component Value Date   HGBA1C 5.5 09/13/2009   HGBA1C 5.5 04/06/2008    Orders:  No orders of the defined types were placed in this encounter.  No orders of the defined types were placed in this encounter.    Procedures: No procedures performed  Clinical Data: No additional findings.  ROS:  All other systems negative, except as noted in the HPI. Review of Systems  Objective: Vital Signs: There were no vitals taken for this visit.  Specialty Comments:  No specialty comments available.  PMFS History: Patient Active Problem List   Diagnosis Date Noted  . Bunion of great toe of right foot 05/21/2016  . Knee pain, right 11/19/2014  . Right hip pain  11/19/2014  . Chronic cough 06/15/2014  . HTN (hypertension) 06/15/2014  . Osteopenia determined by [facility] 02/18/2014  . Lumbar foraminal stenosis 12/07/2013  . Benign paroxysmal positional vertigo 03/01/2013  . Nonsuppurative otitis media, not specified as acute or chronic 03/01/2013  . 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 Diabetes mellitus 07/01/2011  . Asthma, moderate persistent 01/15/2011  . Osteoarthrosis, unspecified whether generalized or localized, involving lower leg 10/30/2009  . Backache 10/30/2009  . ORTHOSTATIC HYPOTENSION 05/17/2008  . EATING DISORDER, UNSPECIFIED 11/10/2007  . HYPERGLYCEMIA 02/23/2007  . GASTROPARESIS 04/25/2006   Past Medical History:  Diagnosis Date  . Anemia   . Anorexia nervosa   . Anxiety   . Arthritis    bilateral knees  . Asthma    mainly problems in cold weather  . Chronic pain syndrome   . Depression   . Diabetes mellitus   . Encounter for long-term (current) use of other medications   . Gastroparesis    botox injections  . GERD (gastroesophageal reflux disease)   . History of blood transfusion   . Hyperglycemia   . Hypertension   . Migraine   . Migraine   . Multiple allergies   . Murmur, cardiac    seen cardiologist in past - no workup required  . Neuropathy    right foot  .  Painful respiration   . Personality change due to conditions classified elsewhere   . Raynaud's syndrome   . Tremor   . Ventral hernia   . Vitamin D deficiency     Family History  Problem Relation Age of Onset  . Urinary tract infection Mother   . Sleep disorder Mother   . Diabetes Father   . High blood pressure Father   . Cancer - Prostate Father   . Emphysema Paternal Grandfather        was a smoker    Past Surgical History:  Procedure Laterality Date  . BOTOX INJECTION N/A 11/18/2012   Procedure: BOTOX INJECTION;  Surgeon: Missy Sabins, MD;  Location: [facility];  Service: Endoscopy;  Laterality: N/A;  . BOTOX INJECTION N/A  09/30/2013   Procedure: BOTOX INJECTION;  Surgeon: Missy Sabins, MD;  [facility];  Service: Endoscopy;  Laterality: N/A;  . BOTOX INJECTION N/A 03/29/2015   Procedure: BOTOX INJECTION;  Surgeon: Teena Irani, MD;  Location: [place];  Service: Endoscopy;  Laterality: N/A;  . BREAST SURGERY    . CHOLECYSTECTOMY    . COLONOSCOPY    . COLONOSCOPY WITH PROPOFOL N/A 03/29/2015   Procedure: COLONOSCOPY WITH PROPOFOL;  Surgeon: Teena Irani, MD;  Location: [place];  Service: Endoscopy;  Laterality: N/A;  . ESOPHAGOGASTRODUODENOSCOPY  08/20/2011   Procedure: ESOPHAGOGASTRODUODENOSCOPY (EGD);  Surgeon: Missy Sabins, MD;  Location: [facility]COPY;  Service: Endoscopy;  Laterality: N/A;  . ESOPHAGOGASTRODUODENOSCOPY N/A 11/18/2012   Procedure: ESOPHAGOGASTRODUODENOSCOPY (EGD);  Surgeon: Missy Sabins, MD;  Location: [facility];  Service: Endoscopy;  Laterality: N/A;  . ESOPHAGOGASTRODUODENOSCOPY N/A 09/30/2013   Procedure: ESOPHAGOGASTRODUODENOSCOPY (EGD);  Surgeon: Missy Sabins, MD;  Location: [facility];  Service: Endoscopy;  Laterality: N/A;  . ESOPHAGOGASTRODUODENOSCOPY (EGD) WITH PROPOFOL N/A 03/29/2015   Procedure: ESOPHAGOGASTRODUODENOSCOPY (EGD) WITH PROPOFOL;  Surgeon: Teena Irani, MD;  Location: [facility];  Service: Endoscopy;  Laterality: N/A;  . HERNIA REPAIR    . LUMBAR LAMINECTOMY/DECOMPRESSION MICRODISCECTOMY Right 12/07/2013   Procedure: LUMBAR LAMINECTOMY/DECOMPRESSION MICRODISCECTOMY RIGHT  LUMBAR FIVE-SACRAL ONE ,FORAMINOTOMY;  Surgeon: Floyce Stakes, MD;  Location: [facility];  Service: Neurosurgery;  Laterality: Right;  . NISSEN FUNDOPLICATION  [id number]   Kaylyn Lim  . PEG TUBE PLACEMENT     removed 1996  . TONSILLECTOMY AND ADENOIDECTOMY     Social History   Occupational History  . Disabled Unemployed   Social History Main Topics  . Smoking status: Former Smoker    Packs/day: 0.50    Years: 2.00    Types: Cigarettes    Quit date: 02/11/1993  . Smokeless  tobacco: Never Used  . Alcohol use No     Comment: quit ETOH in 1982  . Drug use: No  . Sexual activity: Not on file

## 2016-10-08 ENCOUNTER — Ambulatory Visit: Payer: Medicare Other | Admitting: Family Medicine

## 2016-10-15 ENCOUNTER — Ambulatory Visit (INDEPENDENT_AMBULATORY_CARE_PROVIDER_SITE_OTHER): Payer: Medicare Other | Admitting: Orthopedic Surgery

## 2016-10-15 DIAGNOSIS — M21611 Bunion of right foot: Secondary | ICD-10-CM

## 2016-10-15 NOTE — Progress Notes (Signed)
Office Visit Note   Patient: Becky [name]           Date of Birth: Jun 04, 1955           MRN: [id number] Visit Date: 10/15/2016              Requested by: [name], [address] [place], [place] [id number] PCP: [name], MD  Chief Complaint  Patient presents with  . Right Foot - Follow-up      HPI: Patient is a 61 year old woman who presents in follow-up status post bunion and claw toe surgery right foot. She states she has numbness around the great toe second and third toes and has pain across the forefoot.  Assessment & Plan: Visit Diagnoses:  1. Bunion of great toe of right foot     Plan: Recommended heel cord stretching this was demonstrated to her. Recommended scar massage with stretching of the second toe.  Follow-Up Instructions: Return in about 4 weeks (around 11/12/2016).   Ortho Exam  Patient is alert, oriented, no adenopathy, well-dressed, normal affect, normal respiratory effort. Examination patient does have heel cord tightness with dorsiflexion only to neutral with her knee extended. She has no pain with range of motion of the great toe MTP joint. She states she does have some feeling around the great toe but feels numb dorsally. She also complains of circumferential numbness in the second and third toes there is minimal swelling. There is no redness no cellulitis no signs of infection. She has a very slight floating of the second toe. Again the importance of scar massage and wearing the mouse pad was discussed with cord stretching was demonstrated. Discussed that with the tight heel cords she is overloading the forefoot.  Imaging: No results found. No images are attached to the encounter.  Labs: Lab Results  Component Value Date   HGBA1C 5.5 09/13/2009   HGBA1C 5.5 04/06/2008    Orders:  No orders of the defined types were placed in this encounter.  No orders of the defined types were placed in this encounter.     Procedures: No procedures performed  Clinical Data: No additional findings.  ROS:  All other systems negative, except as noted in the HPI. Review of Systems  Objective: Vital Signs: There were no vitals taken for this visit.  Specialty Comments:  No specialty comments available.  PMFS History: Patient Active Problem List   Diagnosis Date Noted  . Bunion of great toe of right foot 05/21/2016  . Knee pain, right 11/19/2014  . Right hip pain 11/19/2014  . Chronic cough 06/15/2014  . HTN (hypertension) 06/15/2014  . Osteopenia determined by [facility] 02/18/2014  . Lumbar foraminal stenosis 12/07/2013  . Benign paroxysmal positional vertigo 03/01/2013  . Nonsuppurative otitis media, not specified as acute or chronic 03/01/2013  . 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 Diabetes mellitus 07/01/2011  . Asthma, moderate persistent 01/15/2011  . Osteoarthrosis, unspecified whether generalized or localized, involving lower leg 10/30/2009  . Backache 10/30/2009  . ORTHOSTATIC HYPOTENSION 05/17/2008  . EATING DISORDER, UNSPECIFIED 11/10/2007  . HYPERGLYCEMIA 02/23/2007  . GASTROPARESIS 04/25/2006   Past Medical History:  Diagnosis Date  . Anemia   . Anorexia nervosa   . Anxiety   . Arthritis    bilateral knees  . Asthma    mainly problems in cold weather  . Chronic pain syndrome   . Depression   . Diabetes mellitus   . Encounter for long-term (current)  use of other medications   . Gastroparesis    botox injections  . GERD (gastroesophageal reflux disease)   . History of blood transfusion   . Hyperglycemia   . Hypertension   . Migraine   . Migraine   . Multiple allergies   . Murmur, cardiac    seen cardiologist in past - no workup required  . Neuropathy    right foot  . Painful respiration   . Personality change due to conditions classified elsewhere   . Raynaud's syndrome   . Tremor   . Ventral hernia   . Vitamin D deficiency     Family History  Problem  Relation Age of Onset  . Urinary tract infection Mother   . Sleep disorder Mother   . Diabetes Father   . High blood pressure Father   . Cancer - Prostate Father   . Emphysema Paternal Grandfather        was a smoker    Past Surgical History:  Procedure Laterality Date  . BOTOX INJECTION N/A 11/18/2012   Procedure: BOTOX INJECTION;  Surgeon: Missy Sabins, MD;  [facility];  Service: Endoscopy;  Laterality: N/A;  . BOTOX INJECTION N/A 09/30/2013   Procedure: BOTOX INJECTION;  Surgeon: Missy Sabins, MD;  [facility];  Service: Endoscopy;  Laterality: N/A;  . BOTOX INJECTION N/A 03/29/2015   Procedure: BOTOX INJECTION;  Surgeon: Teena Irani, MD;  Location: [place];  Service: Endoscopy;  Laterality: N/A;  . BREAST SURGERY    . CHOLECYSTECTOMY    . COLONOSCOPY    . COLONOSCOPY WITH PROPOFOL N/A 03/29/2015   Procedure: COLONOSCOPY WITH PROPOFOL;  Surgeon: Teena Irani, MD;  Location: [facility];  Service: Endoscopy;  Laterality: N/A;  . ESOPHAGOGASTRODUODENOSCOPY  08/20/2011   Procedure: ESOPHAGOGASTRODUODENOSCOPY (EGD);  Surgeon: Missy Sabins, MD;  Location: [facility] END[facility]COPY;  Service: Endoscopy;  Laterality: N/A;  . ESOPHAGOGASTRODUODENOSCOPY N/A 11/18/2012   Procedure: ESOPHAGOGASTRODUODENOSCOPY (EGD);  Surgeon: Missy Sabins, MD;  Location: [facility];  Service: Endoscopy;  Laterality: N/A;  . ESOPHAGOGASTRODUODENOSCOPY N/A 09/30/2013   Procedure: ESOPHAGOGASTRODUODENOSCOPY (EGD);  Surgeon: Missy Sabins, MD;  Location: [facility];  Service: Endoscopy;  Laterality: N/A;  . ESOPHAGOGASTRODUODENOSCOPY (EGD) WITH PROPOFOL N/A 03/29/2015   Procedure: ESOPHAGOGASTRODUODENOSCOPY (EGD) WITH PROPOFOL;  Surgeon: Teena Irani, MD;  Location: [place];  Service: Endoscopy;  Laterality: N/A;  . HERNIA REPAIR    . LUMBAR LAMINECTOMY/DECOMPRESSION MICRODISCECTOMY Right 12/07/2013   Procedure: LUMBAR LAMINECTOMY/DECOMPRESSION MICRODISCECTOMY RIGHT  LUMBAR FIVE-SACRAL ONE  ,FORAMINOTOMY;  Surgeon: Floyce Stakes, MD;  Location: [facility];  Service: Neurosurgery;  Laterality: Right;  . NISSEN FUNDOPLICATION  [id number]   Kaylyn Lim  . PEG TUBE PLACEMENT     removed 1996  . TONSILLECTOMY AND ADENOIDECTOMY     Social History   Occupational History  . Disabled Unemployed   Social History Main Topics  . Smoking status: Former Smoker    Packs/day: 0.50    Years: 2.00    Types: Cigarettes    Quit date: 02/11/1993  . Smokeless tobacco: Never Used  . Alcohol use No     Comment: quit ETOH in 1982  . Drug use: No  . Sexual activity: Not on file

## 2016-10-15 NOTE — Progress Notes (Deleted)
Office Visit Note   Patient: Becky [name]           Date of Birth: March 27, 1955           MRN: [id number] Visit Date: 10/15/2016              Requested by: [name], [address] [place], [place] [id number] PCP: [name], MD  Chief Complaint  Patient presents with  . Right Foot - Follow-up      HPI: Patient is a 61 year old woman who is status post bunion  Assessment & Plan: Visit Diagnoses:  1. Bunion of great toe of right foot     Plan: ***  Follow-Up Instructions: Return in about 4 weeks (around 11/12/2016).   Ortho Exam  Patient is alert, oriented, no adenopathy, well-dressed, normal affect, normal respiratory effort. ***  Imaging: No results found. No images are attached to the encounter.  Labs: Lab Results  Component Value Date   HGBA1C 5.5 09/13/2009   HGBA1C 5.5 04/06/2008    Orders:  No orders of the defined types were placed in this encounter.  No orders of the defined types were placed in this encounter.    Procedures: No procedures performed  Clinical Data: No additional findings.  ROS:  All other systems negative, except as noted in the HPI. Review of Systems  Objective: Vital Signs: There were no vitals taken for this visit.  Specialty Comments:  No specialty comments available.  PMFS History: Patient Active Problem List   Diagnosis Date Noted  . Bunion of great toe of right foot 05/21/2016  . Knee pain, right 11/19/2014  . Right hip pain 11/19/2014  . Chronic cough 06/15/2014  . HTN (hypertension) 06/15/2014  . Osteopenia [facility] 02/18/2014  . Lumbar foraminal stenosis 12/07/2013  . Benign paroxysmal positional vertigo 03/01/2013  . Nonsuppurative otitis media, not specified as acute or chronic 03/01/2013  . 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 Diabetes mellitus 07/01/2011  . Asthma, moderate persistent 01/15/2011  . Osteoarthrosis, unspecified whether generalized or  localized, involving lower leg 10/30/2009  . Backache 10/30/2009  . ORTHOSTATIC HYPOTENSION 05/17/2008  . EATING DISORDER, UNSPECIFIED 11/10/2007  . HYPERGLYCEMIA 02/23/2007  . GASTROPARESIS 04/25/2006   Past Medical History:  Diagnosis Date  . Anemia   . Anorexia nervosa   . Anxiety   . Arthritis    bilateral knees  . Asthma    mainly problems in cold weather  . Chronic pain syndrome   . Depression   . Diabetes mellitus   . Encounter for long-term (current) use of other medications   . Gastroparesis    botox injections  . GERD (gastroesophageal reflux disease)   . History of blood transfusion   . Hyperglycemia   . Hypertension   . Migraine   . Migraine   . Multiple allergies   . Murmur, cardiac    seen cardiologist in past - no workup required  . Neuropathy    right foot  . Painful respiration   . Personality change due to conditions classified elsewhere   . Raynaud's syndrome   . Tremor   . Ventral hernia   . Vitamin D deficiency     Family History  Problem Relation Age of Onset  . Urinary tract infection Mother   . Sleep disorder Mother   . Diabetes Father   . High blood pressure Father   . Cancer - Prostate Father   . Emphysema Paternal Grandfather  was a smoker    Past Surgical History:  Procedure Laterality Date  . BOTOX INJECTION N/A 11/18/2012   Procedure: BOTOX INJECTION;  Surgeon: Missy Sabins, MD;  [facility];  Service: Endoscopy;  Laterality: N/A;  . BOTOX INJECTION N/A 09/30/2013   Procedure: BOTOX INJECTION;  Surgeon: Missy Sabins, MD;  [facility];  Service: Endoscopy;  Laterality: N/A;  . BOTOX INJECTION N/A 03/29/2015   Procedure: BOTOX INJECTION;  Surgeon: Teena Irani, MD;  Location: [facility];  Service: Endoscopy;  Laterality: N/A;  . BREAST SURGERY    . CHOLECYSTECTOMY    . COLONOSCOPY    . COLONOSCOPY WITH PROPOFOL N/A 03/29/2015   Procedure: COLONOSCOPY WITH PROPOFOL;  Surgeon: Teena Irani, MD;  Location: [facility];  Service: Endoscopy;  Laterality: N/A;  . ESOPHAGOGASTRODUODENOSCOPY  08/20/2011   Procedure: ESOPHAGOGASTRODUODENOSCOPY (EGD);  Surgeon: Missy Sabins, MD;  Location: [facility] ENDOSCOPY;  Service: Endoscopy;  Laterality: N/A;  . ESOPHAGOGASTRODUODENOSCOPY N/A 11/18/2012   Procedure: ESOPHAGOGASTRODUODENOSCOPY (EGD);  Surgeon: Missy Sabins, MD;  Location: [facility];  Service: Endoscopy;  Laterality: N/A;  . ESOPHAGOGASTRODUODENOSCOPY N/A 09/30/2013   Procedure: ESOPHAGOGASTRODUODENOSCOPY (EGD);  Surgeon: Missy Sabins, MD;  Location: [facility];  Service: Endoscopy;  Laterality: N/A;  . ESOPHAGOGASTRODUODENOSCOPY (EGD) WITH PROPOFOL N/A 03/29/2015   Procedure: ESOPHAGOGASTRODUODENOSCOPY (EGD) WITH PROPOFOL;  Surgeon: Teena Irani, MD;  Location: [facility];  Service: Endoscopy;  Laterality: N/A;  . HERNIA REPAIR    . LUMBAR LAMINECTOMY/DECOMPRESSION MICRODISCECTOMY Right 12/07/2013   Procedure: LUMBAR LAMINECTOMY/DECOMPRESSION MICRODISCECTOMY RIGHT  LUMBAR FIVE-SACRAL ONE ,FORAMINOTOMY;  Surgeon: Floyce Stakes, MD;  Location: [facility];  Service: Neurosurgery;  Laterality: Right;  . NISSEN FUNDOPLICATION  [id number]   Kaylyn Lim  . PEG TUBE PLACEMENT     removed 1996  . TONSILLECTOMY AND ADENOIDECTOMY     Social History   Occupational History  . Disabled Unemployed   Social History Main Topics  . Smoking status: Former Smoker    Packs/day: 0.50    Years: 2.00    Types: Cigarettes    Quit date: 02/11/1993  . Smokeless tobacco: Never Used  . Alcohol use No     Comment: quit ETOH in 1982  . Drug use: No  . Sexual activity: Not on file

## 2016-10-29 ENCOUNTER — Ambulatory Visit (INDEPENDENT_AMBULATORY_CARE_PROVIDER_SITE_OTHER): Payer: Medicare Other | Admitting: Family Medicine

## 2016-10-29 ENCOUNTER — Encounter: Payer: Self-pay | Admitting: Family Medicine

## 2016-10-29 DIAGNOSIS — F509 Eating disorder, unspecified: Secondary | ICD-10-CM | POA: Diagnosis not present

## 2016-10-29 NOTE — Patient Instructions (Addendum)
-   Make a plan to move home.  Include in this plan some specific activities you will work on when home, e.g., get bike back out, and take it in for a tune-up (see [employer] [place] at [facility]).    Food goals: - Continue to eat at least 3 real meals daily.   - Get vegetables at least twice a day.   - Limit tea to before noon.    - Include any tea you drink on http://www.richardson.info/, along with food consumed.

## 2016-10-29 NOTE — Progress Notes (Signed)
Medical Nutrition Therapy:  Appt start time: 1100 end time:  1130. Therapist: Rea College, APRN, MSN, CS ([facility] Psychiatrist: [name], MD  Assessment:  Primary concerns today: eating disorder (F50.9).   Becky [name]'s foot post-bunion surgery is still not doing well, although she has been doing rehab exercises consistently.    Becky [name] saw therapist [name] last week, and was emotionally overcome as she talked about some past family issue.  Becky [name] told her that she is very concerned about Becky [name] being at her parents, which has prompted Becky [name] to take some steps to move back home.  Since this experience with her therapist, Becky [name] has been fearful just going anywhere, not wanting to be alone.  When she has gone to the gym, it has been with her ex-husband.    Food records on [employer] indicate that Becky [name] is consuming 3 meals a day on most days, no snacks, and meals are sometimes no more than a peanut butter sandwich.  She has been drinking tea sometimes later in the day again.  Sleep continues to be bad; not sure how much is related to caffeine intake.    Recent physical activity includes a gym workout, which includes 24 min on the stat bike and 24 on the TM up to 3-4 X wk for the past two weeks.     Progress:  In Progress.    Nutritional Diagnosis:  No progress on NB-1.5 Disordered eating pattern as related to weight anxiety as evidenced by even more restricted food intake (<1000 kcal/day), occasionally skipping meals.     Intervention:  Nutrition counseling.     Monitoring/Evaluation:  Dietary intake, body weight, and physical activity in 6 weeks.

## 2016-11-12 ENCOUNTER — Ambulatory Visit (INDEPENDENT_AMBULATORY_CARE_PROVIDER_SITE_OTHER): Payer: Medicare Other | Admitting: Orthopedic Surgery

## 2016-12-16 ENCOUNTER — Ambulatory Visit (INDEPENDENT_AMBULATORY_CARE_PROVIDER_SITE_OTHER): Payer: Medicare Other | Admitting: Family Medicine

## 2016-12-16 ENCOUNTER — Encounter: Payer: Self-pay | Admitting: Family Medicine

## 2016-12-16 DIAGNOSIS — F509 Eating disorder, unspecified: Secondary | ICD-10-CM | POA: Diagnosis not present
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
## 2016-12-16 NOTE — Patient Instructions (Addendum)
-   Food restriction just adds to depression symptoms.  This means it's really important to eat even when you don't feel like it.   - Grocery shopping:  GET the foods YOU need, and store these foods both at your parents' and your own condo.    - Get frozen vegetables to keep on hand at each place.  This would allow you to at least have a sandwich with some veg's on the side - irrespective of what your parents eat.    - Other foods to add to the grocery list:  Frozen veg's, frozen vegetarian dinners, canned beans, fresh fruit, yogurt, cheese, oatmeal, milk, canned soup*, tuna, bread, eggs.   - Consider seeing Dr. Nori Riis about your foot.    *Microwave veg's, then add canned soup, and re-heat.    Specific Goals: - Physical activity: Use the stationary bike (or walk) at home or at the gym at least 5 minutes 4 X wk.   - Three meals a day.   - Vegetables at least once a day.    - Complete your GOALS SHEET, and bring to follow-up.

## 2016-12-17 ENCOUNTER — Ambulatory Visit: Payer: Medicare Other | Admitting: Family Medicine

## 2017-01-10 ENCOUNTER — Telehealth: Payer: Self-pay | Admitting: Neurology

## 2017-01-10 NOTE — Telephone Encounter (Signed)
IT is ok to switch

## 2017-01-10 NOTE — Telephone Encounter (Signed)
You last saw this patient in 2014 she has been referred back to [place] and she would like to see Dr Jannifer Franklin Is it ok to switch? dg

## 2017-01-10 NOTE — Telephone Encounter (Signed)
This patient has not been seen since 2014, okay to for me to see as a new patient.

## 2017-01-10 NOTE — Telephone Encounter (Signed)
This patient was last here 2014 and saw Dr Krista Blue She has been referred back to [facility] and would like to see you Is it ok to schedule with you? dg

## 2017-01-14 ENCOUNTER — Encounter: Payer: Self-pay | Admitting: Family Medicine

## 2017-01-14 ENCOUNTER — Ambulatory Visit (INDEPENDENT_AMBULATORY_CARE_PROVIDER_SITE_OTHER): Payer: Medicare Other | Admitting: Family Medicine

## 2017-01-14 DIAGNOSIS — F509 Eating disorder, unspecified: Secondary | ICD-10-CM
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
## 2017-01-14 NOTE — Patient Instructions (Addendum)
-   Make a list of activities you would like to pursue, including some activities that feed your soul.   - Email Edmonia Lynch your list no later than Dec 13.   - Make arrangements to do something social with at least one of your friends from the [place].    Goals: 1. Limit diet sweet tea to mornings only, and track what you drink, including when you have tea.  2. Eat at least 3 REAL meals and 1-2 snacks per day.  Aim for no more than 5 hours between eating.  Eat breakfast within one hour of getting up.  A REAL meal includes at least some protein, some starch, and vegetables and/or fruit.    3. Get at least 5 minutes of exercise 4 X wk.    - Complete your Goals Sheet, and bring to follow-up.  IF you misplace your Goals Sheet, let me know!

## 2017-02-24 ENCOUNTER — Encounter: Payer: Self-pay | Admitting: Family Medicine

## 2017-02-24 ENCOUNTER — Ambulatory Visit (INDEPENDENT_AMBULATORY_CARE_PROVIDER_SITE_OTHER): Payer: Medicare Other | Admitting: Family Medicine

## 2017-02-24 DIAGNOSIS — F509 Eating disorder, unspecified: Secondary | ICD-10-CM | POA: Diagnosis not present

## 2017-02-24 NOTE — Patient Instructions (Addendum)
-   Sleep hygiene: Go to bed later than 9 PM, and if you can't fall asleep within ~30 min, get up and read or do something that doesn't involve a screen, or a lot of thinking, or physical activity.  For example: Art, reading, writing.    Specific Goals: 1. Eat at least 3 REAL meals and 1-2 snacks per day.  Aim for no more than 5 hours between eating.  Eat breakfast within one hour of getting up.  A REAL meal includes at least some protein, some starch, and vegetables and/or fruit.  - Work with [name] on establishing some plans to make this happen.  Reminder: Whether or not your parents are in the mood to eat has NO bearing on your own need to have a meal.    2. Physical activity: Get at least 10 minutes 4 X wk.    3. http://www.richardson.info/ message each day, to report how you have done with your goals.
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
## 2017-03-11 ENCOUNTER — Telehealth: Payer: Self-pay | Admitting: Neurology

## 2017-03-11 NOTE — Telephone Encounter (Signed)
Called patient to confirm thurs. 03/13/17 appointment and patient states that she would like for it to be noted that she is having bad migraines.

## 2017-03-11 NOTE — Telephone Encounter (Signed)
Noted.  All the more reason not to miss her new patient appointment.

## 2017-03-13 ENCOUNTER — Encounter: Payer: Self-pay | Admitting: Neurology

## 2017-03-13 ENCOUNTER — Ambulatory Visit (INDEPENDENT_AMBULATORY_CARE_PROVIDER_SITE_OTHER): Payer: Medicare Other | Admitting: Neurology

## 2017-03-13 VITALS — BP 100/69 | HR 56 | Ht 60.0 in | Wt 160.0 lb

## 2017-03-13 DIAGNOSIS — R413 Other amnesia: Secondary | ICD-10-CM | POA: Diagnosis not present

## 2017-03-13 DIAGNOSIS — E538 Deficiency of other specified B group vitamins: Secondary | ICD-10-CM

## 2017-03-13 DIAGNOSIS — G3184 Mild cognitive impairment, so stated: Secondary | ICD-10-CM | POA: Diagnosis not present

## 2017-03-13 HISTORY — DX: Mild cognitive impairment of uncertain or unknown etiology: G31.84

## 2017-03-13 NOTE — Progress Notes (Signed)
Reason for visit: Memory disturbance  Referring physician: Dr. Verlee Rossetti is a 62 y.o. female  History of present illness:  Ms. Barlowe is a 62 year old right-handed white female with a history of significant depression requiring hospitalizations in the past.  The patient indicates that her depression has worsened recently as she has been under stress taking care of her parents.  She is now living with her parents and both parents have dementia.  The patient has reported some problems with insomnia, she will typically wake up around 3 AM and not get back to sleep.  The patient is on trazodone for this.  Over the last year she has noted some problems with word finding and some short-term memory issues.  She still operates a motor vehicle without difficulty, she is having some difficulty keeping up with medications and appointments because she is now keeping up with both of her parents as well.  She is able to manage the finances fairly well.  The patient does report some fatigue during the day, she does not fall asleep however when inactive.  The patient has a history of a tremor, she was seen by Dr. Erling Cruz in the past and was placed on Cogentin which she continues taking 0.5 mg twice daily.  The patient has a history of migraine headaches, she is having daily headaches currently.  She was placed on Topamax 25 mg twice daily in the summer 2018.  The patient remains on several psychoactive medications including Geodon, clonazepam, Wellbutrin, Cymbalta, trazodone, and Topamax.  She is sent to this office for further evaluation.  Past Medical History:  Diagnosis Date  . Anemia   . Anorexia nervosa   . Anxiety   . Arthritis    bilateral knees  . Asthma    mainly problems in cold weather  . Chronic pain syndrome   . Depression   . Diabetes mellitus   . Encounter for long-term (current) use of other medications   . Gastroparesis    botox injections  . GERD  (gastroesophageal reflux disease)   . History of blood transfusion   . Hyperglycemia   . Hypertension   . MCI (mild cognitive impairment) 03/13/2017  . Migraine   . Migraine   . Multiple allergies   . Murmur, cardiac    seen cardiologist in past - no workup required  . Neuropathy    right foot  . Painful respiration   . Personality change due to conditions classified elsewhere   . Raynaud's syndrome   . Tremor   . Ventral hernia   . Vitamin D deficiency     Past Surgical History:  Procedure Laterality Date  . BOTOX INJECTION N/A 11/18/2012   Procedure: BOTOX INJECTION;  Surgeon: Missy Sabins, MD;  Location: [facility];  Service: Endoscopy;  Laterality: N/A;  . BOTOX INJECTION N/A 09/30/2013   Procedure: BOTOX INJECTION;  Surgeon: Missy Sabins, MD;  Location: [facility];  Service: Endoscopy;  Laterality: N/A;  . BOTOX INJECTION N/A 03/29/2015   Procedure: BOTOX INJECTION;  Surgeon: Teena Irani, MD;  Location: [facility];  Service: Endoscopy;  Laterality: N/A;  . BREAST SURGERY    . CHOLECYSTECTOMY    . COLONOSCOPY    . COLONOSCOPY WITH PROPOFOL N/A 03/29/2015   Procedure: COLONOSCOPY WITH PROPOFOL;  Surgeon: Teena Irani, MD;  Location: [facility];  Service: Endoscopy;  Laterality: N/A;  . ESOPHAGOGASTRODUODENOSCOPY  08/20/2011   Procedure: ESOPHAGOGASTRODUODENOSCOPY (EGD);  Surgeon: Missy Sabins, MD;  Location: [facility];  Service: Endoscopy;  Laterality: N/A;  . ESOPHAGOGASTRODUODENOSCOPY N/A 11/18/2012   Procedure: ESOPHAGOGASTRODUODENOSCOPY (EGD);  Surgeon: Missy Sabins, MD;  Location: [facility];  Service: Endoscopy;  Laterality: N/A;  . ESOPHAGOGASTRODUODENOSCOPY N/A 09/30/2013   Procedure: ESOPHAGOGASTRODUODENOSCOPY (EGD);  Surgeon: Missy Sabins, MD;  Location: [facility];  Service: Endoscopy;  Laterality: N/A;  . ESOPHAGOGASTRODUODENOSCOPY (EGD) WITH PROPOFOL N/A 03/29/2015   Procedure: ESOPHAGOGASTRODUODENOSCOPY (EGD) WITH PROPOFOL;  Surgeon: Teena Irani, MD;   Location: [facility];  Service: Endoscopy;  Laterality: N/A;  . HERNIA REPAIR    . LUMBAR LAMINECTOMY/DECOMPRESSION MICRODISCECTOMY Right 12/07/2013   Procedure: LUMBAR LAMINECTOMY/DECOMPRESSION MICRODISCECTOMY RIGHT  LUMBAR FIVE-SACRAL ONE ,FORAMINOTOMY;  Surgeon: Floyce Stakes, MD;  Location: [facility];  Service: Neurosurgery;  Laterality: Right;  . NISSEN FUNDOPLICATION  [id number]   Kaylyn Lim  . PEG TUBE PLACEMENT     removed 1996  . TONSILLECTOMY AND ADENOIDECTOMY      Family History  Problem Relation Age of Onset  . Urinary tract infection Mother   . Sleep disorder Mother   . Dementia Mother   . Diabetes Father   . High blood pressure Father   . Cancer - Prostate Father   . Dementia Father   . Emphysema Paternal Grandfather        was a smoker    Social history:  reports that she quit smoking about 24 years ago. Her smoking use included cigarettes. She has a 1.00 pack-year smoking history. she has never used smokeless tobacco. She reports that she does not drink alcohol or use drugs.  Medications:  Prior to Admission medications   Medication Sig Start Date End Date Taking? Authorizing Provider  albuterol (PROAIR HFA) 108 (90 BASE) MCG/ACT inhaler INHALE 2 PUFFS INTO THE LUNGS EVERY 6 HOURS AS NEEDED 03/01/13  Yes [name], MD  albuterol (PROVENTIL) (2.5 MG/3ML) 0.083% nebulizer solution Take 3 mLs (2.5 mg total) by nebulization every 6 (six) hours as needed. DX:  493.00 FILE WITH MCR PART B Patient taking differently: Take 2.5 mg by nebulization every 6 (six) hours as needed for wheezing or shortness of breath. DX:  493.00 FILE WITH MCR PART B 01/03/12  Yes [name], MD  benztropine (COGENTIN) 0.5 MG tablet Take 0.5 mg by mouth at bedtime.    Yes [provider]  budesonide (PULMICORT FLEXHALER) 180 MCG/ACT inhaler Inhale 2 puffs into the lungs 2 (two) times daily. 05/30/14  Yes [name], MD  buPROPion (WELLBUTRIN XL) 150 MG 24 hr tablet  Take 450 mg by mouth every morning. Take 3 tablets by mouth daily to = 450mg    Yes [provider]  clonazePAM (KLONOPIN) 0.5 MG tablet Take 0.5 mg by mouth 2 (two) times daily. Takes 0.25 mg in AM, and 0.5 mg at bedtime.   Yes [provider]  DULoxetine (CYMBALTA) 60 MG capsule Take 120 mg by mouth daily. Use 30 mg and 60 mg capsules 10/20/14  Yes [provider]  EPIPEN 2-PAK 0.3 MG/0.3ML SOAJ injection See admin instructions. 04/05/14  Yes [provider]  hydrochlorothiazide (HYDRODIURIL) 12.5 MG tablet Take 12.5 mg by mouth daily. 01/18/15  Yes [provider]  ibuprofen (ADVIL,MOTRIN) 200 MG tablet Take 800 mg by mouth daily as needed for headache or mild pain. Alternates between 600 mg Rx and 4-200 mg OTC   Yes [provider]  meclizine (ANTIVERT) 25 MG tablet Take 1 tablet by mouth daily as needed for dizziness.  Yes [provider]  metFORMIN (GLUCOPHAGE-XR) 500 MG 24 hr tablet  03/16/16  Yes [provider]  nebivolol (BYSTOLIC) 10 MG tablet Take 1 tablet (10 mg total) by mouth daily. 10/19/15  Yes Parrett, Tammy S, NP  omeprazole (PRILOSEC) 20 MG capsule Take 20 mg by mouth daily.   Yes [provider]  promethazine (PHENERGAN) 25 MG tablet Take 25 mg by mouth every 6 (six) hours as needed for nausea.    Yes [provider]  topiramate (TOPAMAX) 25 MG tablet 25 mg daily.  05/01/16  Yes [provider]  traZODone (DESYREL) 50 MG tablet Take 100 mg by mouth at bedtime.    Yes [provider]  ziprasidone (GEODON) 60 MG capsule Take 60 mg by mouth 2 (two) times daily.    Yes [provider]      Allergies  Allergen Reactions  . Methadone Shortness Of Breath and Other (See Comments)    Chest Pain  . Nitrofurantoin Shortness Of Breath and Other (See Comments)    Chest Pain  . Percocet [Oxycodone-Acetaminophen] Shortness Of Breath and Other (See Comments)    Chest pain  .  Penicillins Nausea And Vomiting, Rash and Other (See Comments)    Fever, including amoxicillin  Has patient had a PCN reaction causing immediate rash, facial/tongue/throat swelling, SOB or lightheadedness with hypotension: Yes Has patient had a PCN reaction causing severe rash involving mucus membranes or skin necrosis: No Has patient had a PCN reaction that required hospitalization No Has patient had a PCN reaction occurring within the last 10 years: No If all of the above answers are "NO", then may proceed with Cephalosporin use.   Marland Kitchen Amoxicillin Rash  . Aspirin Other (See Comments)    stomach pain  . Clarithromycin Rash and Other (See Comments)    Fever  . Codeine Nausea And Vomiting  . Hydrocodone-Acetaminophen Itching    Premeditate benadryl  . Ms Contin Cleone Slim Sulfate] Nausea And Vomiting    Morphine IR and ER  . Propoxyphene Nausea Only    Sick to stomach    ROS:  Out of a complete 14 system review of symptoms, the patient complains only of the following symptoms, and all other reviewed systems are negative.  Weight gain, weight loss, fatigue Heart murmur Blurred vision Shortness of breath, cough Incontinence of the bowels Increased thirst Joint pain  Blood pressure 100/69, pulse (!) 56, height 5' (1.524 m), weight 160 lb (72.6 kg).  Physical Exam  General: The patient is alert and cooperative at the time of the examination.  Eyes: Pupils are equal, round, and reactive to light. Discs are flat bilaterally.  Neck: The neck is supple, no carotid bruits are noted.  Respiratory: The respiratory examination is clear.  Cardiovascular: The cardiovascular examination reveals a regular rate and rhythm, no obvious murmurs or rubs are noted.  Skin: Extremities are without significant edema.  Neurologic Exam  Mental status: The patient is alert and oriented x 3 at the time of the examination. The patient has apparent normal recent and remote memory, with an apparently  normal attention span and concentration ability.  Mini-Mental status examination done today shows a total score of 29/30.  Cranial nerves: Facial symmetry is present. There is good sensation of the face to pinprick and soft touch bilaterally. The strength of the facial muscles and the muscles to head turning and shoulder shrug are normal bilaterally. Speech is well enunciated, no aphasia or dysarthria is noted. Extraocular movements are  full. Visual fields are full. The tongue is midline, and the patient has symmetric elevation of the soft palate. No obvious hearing deficits are noted.  Mild masking of the face is seen.  Motor: The motor testing reveals 5 over 5 strength of all 4 extremities. Good symmetric motor tone is noted throughout.  Sensory: Sensory testing is intact to pinprick, soft touch, vibration sensation, and position sense on all 4 extremities. No evidence of extinction is noted.  Coordination: Cerebellar testing reveals good finger-nose-finger and heel-to-shin bilaterally.  Gait and station: Gait is normal. Tandem gait is normal. Romberg is negative. No drift is seen.  Reflexes: Deep tendon reflexes are symmetric and normal bilaterally. Toes are downgoing bilaterally.   Assessment/Plan:  1.  Reported memory disturbance  2.  History of depression  The patient reports several issues that may be leading to some troubles with memory.  The patient is under increased stress, her depression has worsened over the last year.  She is not sleeping well at night.  The patient is on multiple psychoactive medications, medications such as Cogentin and Topamax may have significant cognitive side effects.  The patient will undergo blood work today, she will have MRI evaluation of the brain.  The patient will follow-up in 6 months.  I have asked her to taper off of the Topamax and get off of the Cogentin.  The patient will be sent for neuropsychological evaluation as well.  Pseudo-dementia  associated with depression needs to be considered.  Jill Alexanders MD 03/13/2017 11:11 AM  [facility] [address] [place], [place] [address]  Phone [phone] Fax [phone]

## 2017-03-13 NOTE — Patient Instructions (Signed)
   With the medications, try to stop the topamax 25 mg taking one at night for 2 weeks, then stop. Come off of the cogentin if possible.  We will get MRI of the brain and blood work. We will schedule neuropsychological testing.

## 2017-03-14 ENCOUNTER — Telehealth: Payer: Self-pay | Admitting: Neurology

## 2017-03-14 LAB — VITAMIN B12: VITAMIN B 12: 231 pg/mL — AB (ref 232–1245)

## 2017-03-14 LAB — HIV ANTIBODY (ROUTINE TESTING W REFLEX): HIV SCREEN 4TH GENERATION: NONREACTIVE

## 2017-03-14 LAB — RPR: RPR: NONREACTIVE

## 2017-03-14 LAB — SEDIMENTATION RATE: Sed Rate: 2 mm/hr (ref 0–40)

## 2017-03-14 NOTE — Telephone Encounter (Signed)
I called the patient.  The blood work was unremarkable except that the vitamin B12 level was slightly low.  The patient will go on 1000 mcg tablets daily, we will recheck the B12 level in the future.

## 2017-03-17 ENCOUNTER — Encounter: Payer: Self-pay | Admitting: Psychology

## 2017-03-24 ENCOUNTER — Ambulatory Visit
Admission: RE | Admit: 2017-03-24 | Discharge: 2017-03-24 | Disposition: A | Discharge status: Home / Self Care | Payer: Medicare Other | Source: Ambulatory Visit | Attending: Neurology | Admitting: Neurology

## 2017-03-24 ENCOUNTER — Ambulatory Visit (INDEPENDENT_AMBULATORY_CARE_PROVIDER_SITE_OTHER): Payer: Medicare Other | Admitting: Family Medicine

## 2017-03-24 ENCOUNTER — Telehealth: Payer: Self-pay | Admitting: Neurology

## 2017-03-24 ENCOUNTER — Encounter: Payer: Self-pay | Admitting: Family Medicine

## 2017-03-24 DIAGNOSIS — G3184 Mild cognitive impairment, so stated: Secondary | ICD-10-CM

## 2017-03-24 DIAGNOSIS — R413 Other amnesia: Secondary | ICD-10-CM | POA: Diagnosis not present

## 2017-03-24 DIAGNOSIS — F509 Eating disorder, unspecified: Secondary | ICD-10-CM

## 2017-03-24 NOTE — Patient Instructions (Addendum)
-   Ask [name] today: Can the home health aide come over in the afternoon for 4 hours 3 days a week or 3 hours 3 days a week and 4 hours once a week?  This will help ensure they get baths and meals 3 or 4 days a week.    - You did not eat even 500 calories yesterday.  If your goal is to lose some weight, this way of eating is counter-productive b/c this amount of energy and protein makes it impossible for you to build or even maintain lean tissue (muscle).  That means it ensures that you are lowering your resting metabolic rate (and you must be losing bone at a faster rate than normal to make up for what you're not getting in your diet).  - My recommendation: If you cannot nourish yourself with three balanced meals and at least one snack each day while being at your parents' home, then you need to stay home to take care of yourself.     Goals remain the same: 1. Eat at least 3 REAL meals and 1-2 snacks per day.  Aim for no more than 5 hours between eating.  Eat breakfast within one hour of getting up.  A REAL meal includes at least some protein, some starch, and vegetables and/or fruit.        - Work with [name] on establishing some plans to make this happen.  Reminder: Whether or not your parents are in the mood to eat has NO bearing on your own need to have a meal.    2. Physical activity: Get at least 10 minutes 4 X wk.    3. http://www.richardson.info/ message each day, to report how you have done with the two goals above.  When you report any exercise, record the number of minutes, and let me know how many days this week this exercise makes.    AND: Send a [email or web address] message to let me know your plans with respect to how often and how long you will be at your parents this week.  The objective:  HAVE A PLAN, AND STICK WITH IT!  Part of the plan is to let your mom know approximately when and how long you will be there.

## 2017-03-24 NOTE — Telephone Encounter (Signed)
  I called the patient.  MRI of the brain shows minimal white matter changes, no significant atrophy is seen.  The patient will be going for neuropsychological evaluation.   MRI brain 03/23/17:  IMPRESSION:  This MRI of the brain without contrast shows the following: 1.  Few T2/FLAIR hyperintense foci in the subcortical or deep white matter of the left frontal lobe. This is nonspecific but most likely represents age appropriate minimal chronic microvascular ischemic change. The foci were not readily apparent on the 2012 MRI. 2.   Brain volume is normal for age. 3.   There are no acute findings.

## 2017-03-24 NOTE — Progress Notes (Signed)
Medical Nutrition Therapy:  Appt start time: 1130 end time: 1200. PCP: Kathryne Eriksson, MD Therapist: Rea College, APRN, MSN, CS ([facility] Psychiatrist: [name], MD  Assessment:  Primary concerns today: eating disorder (F50.9).   Weight today: 156.4 lb, down ~1 lb in the past month.    Psycho-social: Currently, Becky [name] and her siblings are considering getting a person to come into the home 3-4 times a week.  Meanwhile, Jerrica continues to spend a lot of time at her parents', where she rarely eats meals.  We discussed at length the ramifications of this continued inadequate intake (see Pt Instrxns).    Sleep: Grae still suffers from poor sleep; did not fall asleep last night till ~3:30 AM.    Nutrition: Verenis was diagnosed as low in vitamin B12 (131 vs. normal range of [phone] pg/mL, down from 595 in 2010), so she is now supplementing with 1000 mcg B12 daily.  Neuro workup with Dr. Jannifer Franklin, whom she saw for concerns of memory problems and headaches, was otherwise unremarkable.  She is having a brain MRI later today.  I also discussed with Layton that her symptoms of poor sleep, lethargy, and short-term memory problems may be related to what is now long-term malnutrition.    24-hr recall suggests intake of 470 kcal:  (Up at 9 AM) B (9:15 AM)-  16 oz diet sweet tea Snk (10 AM)-  16 oz diet sweet tea Snk (10:45)-  16 oz diet sweet tea L (1 PM)-  1 soy corn dog (180 kcal), water   Snk ( PM)-  Water (4-5 X 16 oz) D (7 PM)-  1 Sp K protein bar (170 kcal), 5 oz Grk yogurt (120 kcal), water Snk ( PM)-  water Typical day? Yes.   Has usually been eating small meals 2-3 X day.  Diet tea intake is usually this amount or more.    Recent physical activity includes none.  Progress:  In Progress.    Nutritional Diagnosis:  No progress on NB-1.5 Disordered eating pattern as related to weight (and other) anxiety as evidenced by restricted food intake  (<500 kcal yesterday), including frequently skipped meals.     Intervention:  Nutrition counseling.     Monitoring/Evaluation:  Dietary intake, body weight, and physical activity in 4 weeks.

## 2017-04-22 ENCOUNTER — Encounter: Payer: Self-pay | Admitting: Family Medicine

## 2017-04-22 ENCOUNTER — Ambulatory Visit (INDEPENDENT_AMBULATORY_CARE_PROVIDER_SITE_OTHER): Payer: Medicare Other | Admitting: Family Medicine

## 2017-04-22 DIAGNOSIS — F509 Eating disorder, unspecified: Secondary | ICD-10-CM | POA: Diagnosis not present

## 2017-04-22 NOTE — Patient Instructions (Addendum)
-   Self-care goals: - "Do your best to manage the negative, self-destructive thoughts."    How do you do this?  Homework:  - Re-read the pdf file about handing derailing, deceptive negative thoughts. - Make a list of activities you can use to re-focus at times you are struggling.   - Email some of these activities to [place] no later than 3/19.   - Use the "Urge" process discussed today next time you are having negative thoughts that are pushing you in a bad direction.      Goals: 1. Eat at least 3 REAL meals and 1-2 snacks per day. Aim for no more than 5 hours between eating. Eat breakfast within one hour of getting up. A REAL meal includes at least some protein, some starch, and vegetables and/or fruit.   2. Physical activity: Get at least 20 minutes 4 X wk.   3. http://www.richardson.info/ message each day, to report how you have done with the two goals above. When you report any exercise, record the number of minutes, and let me know how many days this week this exercise makes.

## 2017-04-22 NOTE — Progress Notes (Signed)
Medical Nutrition Therapy:  Appt start time: 1130 end time: 1200. PCP: Kathryne Eriksson, MD Therapist: Rea College, APRN, MSN, CS ([facility] Psychiatrist: [name], MD  Assessment:  Primary concerns today: eating disorder (F50.9).   Blind weight today: 151.6 lb, down ~5 lb in the past month.    Psycho-social: Becky [name] had an MRI done last week; negative findings.  The family has decided that they can't afford to have home health aides coming in to help their parents.  Becky [name] has not been to her parents' since last Saturday, when she had another big fight with her dad.  Her therapist has recommended that Becky [name] take a full month away from going to her parents.    Sleep: Becky [name] still suffers from poor sleep; did not fall asleep last night till ~3:30 AM.    Nutrition: Becky [name] has not been eating well or much, experiencing a lot of stress related to spending time at her parents' most days of the week.    Recent physical activity includes none.  Progress:  In Progress.    Nutritional Diagnosis:  No progress on NB-1.5 Disordered eating pattern as related to weight (and other) anxiety as evidenced by restricted food intake, including frequently skipped meals.     Intervention:  Nutrition counseling.     Monitoring/Evaluation:  Dietary intake, body weight, and physical activity in 4 weeks.

## 2017-05-07 ENCOUNTER — Ambulatory Visit (INDEPENDENT_AMBULATORY_CARE_PROVIDER_SITE_OTHER): Payer: Medicare Other | Admitting: Orthopedic Surgery

## 2017-05-13 ENCOUNTER — Ambulatory Visit (INDEPENDENT_AMBULATORY_CARE_PROVIDER_SITE_OTHER): Payer: Medicare Other

## 2017-05-13 ENCOUNTER — Ambulatory Visit (INDEPENDENT_AMBULATORY_CARE_PROVIDER_SITE_OTHER): Payer: Medicare Other | Admitting: Orthopedic Surgery

## 2017-05-13 ENCOUNTER — Encounter (INDEPENDENT_AMBULATORY_CARE_PROVIDER_SITE_OTHER): Payer: Self-pay | Admitting: Orthopedic Surgery

## 2017-05-13 DIAGNOSIS — M205X1 Other deformities of toe(s) (acquired), right foot: Secondary | ICD-10-CM

## 2017-05-13 DIAGNOSIS — M79671 Pain in right foot: Secondary | ICD-10-CM

## 2017-05-13 DIAGNOSIS — M21611 Bunion of right foot: Secondary | ICD-10-CM

## 2017-05-13 NOTE — Progress Notes (Signed)
Office Visit Note   Patient: Becky [name]           Date of Birth: 07-26-1955           MRN: [id number] Visit Date: 05/13/2017              Requested by: [name], [address] [place], [place] [address] PCP: [name], MD  Chief Complaint  Patient presents with  . Right Foot - Pain      HPI: Patient is a 62 year old woman who presents 1 year status post bunion and claw toe surgery of the right foot.  Patient states that she has developed some flexion contracture of the PIP joint of the second toe and this is allowed the great toe increase the valgus deformity.  Assessment & Plan: Visit Diagnoses:  1. Pain in right foot   2. Bunion of great toe of right foot   3. Claw toe, acquired, right     Plan: Examination patient has a good dorsalis pedis pulse.  There is no redness no cellulitis no swelling in the forefoot.  She has developed a fixed flexion contracture of the PIP joint which is allowed for valgus deformity of the great toe.  Patient has had some stretching of the capsule repaired medially.  Review of the radiographs shows a congruent metatarsal head cascade.  No avascular necrosis no redness no cellulitis no signs of infection.  Follow-Up Instructions: Return in about 1 week (around 05/20/2017).   Ortho Exam  Patient is alert, oriented, no adenopathy, well-dressed, normal affect, normal respiratory effort. Due to the progression of the clawing of the second toe would plan for a PIP resection of the second toe with Aiken osteotomy for the great toe.  Patient states she like to proceed with surgery after a hiking trip in June.  Risk and benefits were discussed patient states she understands wishes to proceed at this time.  Imaging: Xr Foot Complete Right  Result Date: 05/13/2017 3 view radiographs of the right foot shows metatarsal cascade across the first second and third metatarsals.  Joint spaces are congruent no avascular necrosis no stress  fractures.  No images are attached to the encounter.  Labs: Lab Results  Component Value Date   HGBA1C 5.5 09/13/2009   HGBA1C 5.5 04/06/2008   ESRSEDRATE 2 03/13/2017    @LABSALLVALUES (HGBA1)@  There is no height or weight on file to calculate BMI.  Orders:  Orders Placed This Encounter  Procedures  . XR Foot Complete Right   No orders of the defined types were placed in this encounter.    Procedures: No procedures performed  Clinical Data: No additional findings.  ROS:  All other systems negative, except as noted in the HPI. Review of Systems  Objective: Vital Signs: There were no vitals taken for this visit.  Specialty Comments:  No specialty comments available.  PMFS History: Patient Active Problem List   Diagnosis Date Noted  . MCI (mild cognitive impairment) 03/13/2017  . Bunion of great toe of right foot 05/21/2016  . Knee pain, right 11/19/2014  . Right hip pain 11/19/2014  . Chronic cough 06/15/2014  . HTN (hypertension) 06/15/2014  . Osteopenia determined by [facility] 02/18/2014  . Lumbar foraminal stenosis 12/07/2013  . Benign paroxysmal positional vertigo 03/01/2013  . Nonsuppurative otitis media, not specified as acute or chronic 03/01/2013  . 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 Diabetes mellitus 07/01/2011  . Asthma, moderate persistent 01/15/2011  .  Osteoarthrosis, unspecified whether generalized or localized, involving lower leg 10/30/2009  . Backache 10/30/2009  . ORTHOSTATIC HYPOTENSION 05/17/2008  . EATING DISORDER, UNSPECIFIED 11/10/2007  . HYPERGLYCEMIA 02/23/2007  . GASTROPARESIS 04/25/2006   Past Medical History:  Diagnosis Date  . Anemia   . Anorexia nervosa   . Anxiety   . Arthritis    bilateral knees  . Asthma    mainly problems in cold weather  . Chronic pain syndrome   . Depression   . Diabetes mellitus   . Encounter for long-term (current) use of other medications   . Gastroparesis    botox  injections  . GERD (gastroesophageal reflux disease)   . History of blood transfusion   . Hyperglycemia   . Hypertension   . MCI (mild cognitive impairment) 03/13/2017  . Migraine   . Migraine   . Multiple allergies   . Murmur, cardiac    seen cardiologist in past - no workup required  . Neuropathy    right foot  . Painful respiration   . Personality change due to conditions classified elsewhere   . Raynaud's syndrome   . Tremor   . Ventral hernia   . Vitamin D deficiency     Family History  Problem Relation Age of Onset  . Urinary tract infection Mother   . Sleep disorder Mother   . Dementia Mother   . Diabetes Father   . High blood pressure Father   . Cancer - Prostate Father   . Dementia Father   . Emphysema Paternal Grandfather        was a smoker    Past Surgical History:  Procedure Laterality Date  . BOTOX INJECTION N/A 11/18/2012   Procedure: BOTOX INJECTION;  Surgeon: Missy Sabins, MD;  Location: [facility];  Service: Endoscopy;  Laterality: N/A;  . BOTOX INJECTION N/A 09/30/2013   Procedure: BOTOX INJECTION;  Surgeon: Missy Sabins, MD;  [facility]: [facility];  Service: Endoscopy;  Laterality: N/A;  . BOTOX INJECTION N/A 03/29/2015   Procedure: BOTOX INJECTION;  Surgeon: Teena Irani, MD;  Location: [facility];  Service: Endoscopy;  Laterality: N/A;  . BREAST SURGERY    . CHOLECYSTECTOMY    . COLONOSCOPY    . COLONOSCOPY WITH PROPOFOL N/A 03/29/2015   Procedure: COLONOSCOPY WITH PROPOFOL;  Surgeon: Teena Irani, MD;  Location: [facility];  Service: Endoscopy;  Laterality: N/A;  . ESOPHAGOGASTRODUODENOSCOPY  08/20/2011   Procedure: ESOPHAGOGASTRODUODENOSCOPY (EGD);  Surgeon: Missy Sabins, MD;  [facility];  Service: Endoscopy;  Laterality: N/A;  . ESOPHAGOGASTRODUODENOSCOPY N/A 11/18/2012   Procedure: ESOPHAGOGASTRODUODENOSCOPY (EGD);  Surgeon: Missy Sabins, MD;  Location: [facility];  Service: Endoscopy;  Laterality: N/A;  . ESOPHAGOGASTRODUODENOSCOPY  N/A 09/30/2013   Procedure: ESOPHAGOGASTRODUODENOSCOPY (EGD);  Surgeon: Missy Sabins, MD;  Location: [facility];  Service: Endoscopy;  Laterality: N/A;  . ESOPHAGOGASTRODUODENOSCOPY (EGD) WITH PROPOFOL N/A 03/29/2015   Procedure: ESOPHAGOGASTRODUODENOSCOPY (EGD) WITH PROPOFOL;  Surgeon: Teena Irani, MD;  Location: [facility];  Service: Endoscopy;  Laterality: N/A;  . HERNIA REPAIR    . LUMBAR LAMINECTOMY/DECOMPRESSION MICRODISCECTOMY Right 12/07/2013   Procedure: LUMBAR LAMINECTOMY/DECOMPRESSION MICRODISCECTOMY RIGHT  LUMBAR FIVE-SACRAL ONE ,FORAMINOTOMY;  Surgeon: Floyce Stakes, MD;  Location: [facility];  Service: Neurosurgery;  Laterality: Right;  . NISSEN FUNDOPLICATION  2671   Kaylyn Lim  . PEG TUBE PLACEMENT     removed 1996  . TONSILLECTOMY AND ADENOIDECTOMY     Social History   Occupational History  . Occupation:  Disabled    Employer: UNEMPLOYED  Tobacco Use  . Smoking status: Former Smoker    Packs/day: 0.50    Years: 2.00    Pack years: 1.00    Types: Cigarettes    Last attempt to quit: 02/11/1993    Years since quitting: 24.2  . Smokeless tobacco: Never Used  Substance and Sexual Activity  . Alcohol use: No    Comment: quit ETOH in 1982  . Drug use: No  . Sexual activity: Not on file

## 2017-05-19 ENCOUNTER — Ambulatory Visit (INDEPENDENT_AMBULATORY_CARE_PROVIDER_SITE_OTHER): Payer: Medicare Other | Admitting: Family Medicine

## 2017-05-19 ENCOUNTER — Encounter: Payer: Self-pay | Admitting: Family Medicine

## 2017-05-19 DIAGNOSIS — F5000 Anorexia nervosa, unspecified: Secondary | ICD-10-CM

## 2017-05-19 NOTE — Progress Notes (Signed)
Medical Nutrition Therapy:  Appt start time: 1130 end time: 1200. PCP: Kathryne Eriksson, MD Therapist: Rea College, APRN, MSN, CS ([facility] Psychiatrist: [name], MD  Assessment:  Primary concerns today: eating disorder (F50.9).   Blind weight today: 151.6 lb, same as last appt.    Psycho-social: Marvel has been going to her parents' daily, usually 11 AM to 6 PM, despite advice from her therapist to take a month off from her parents.  Elena said she had a meltdown last week one day when talking to her brother on the phone.  She has not used the Urge process, saying she was unable to open the pdf file emailed to her.  We spent much time this morning reviewing this process and how to use it, which she will share with her therapist on Thursday.  Nida identified guilt as the main driver of getting her to go to her parents each day, derived from the predominant thought that if she doesn't go, they will not get dinner.  (Neither will make a meals for him/herself.)  Julieana's sister goes there on some days.  Neither of her brothers live in [place].    Sleep: Did not have time to address.    Nutrition: Oree has been eating one to two meals daily.  She has little/no food at her condo, so does not get breakfast before going over to her parents'.    Recent physical activity includes going to the gym ~ 3-4 X wk, sometimes spending as much as an hour working out.  She has found this to be helpful in dealing with the stress of parent care.    Progress:  In Progress.    Nutritional Diagnosis:  No progress on NB-1.5 Disordered eating pattern as related to weight (and other) anxiety as evidenced by restricted food intake, including frequently skipped meals.     Intervention:  Nutrition counseling.     Monitoring/Evaluation:  Dietary intake, body weight, and physical activity in 4 weeks.

## 2017-05-19 NOTE — Patient Instructions (Addendum)
-   Review pdf document about the Urge process, and use as often as you can.   - Share with [name].    - Ask [name] if you can sign a release for [name] to talk with her, and ask her to fax the release to [place] at [phone], [place].   Food Goals: 1. Eat at least 3 REAL meals and 1-2 snacks per day. Aim for no more than 5 hours between eating. Eat breakfast within one hour of getting up. A REAL meal includes at least some protein, some starch, and vegetables and/or fruit.   2. Physical activity: Get at least 20 minutes 4 X wk.   3. http://www.richardson.info/ message each day, to report how you have done with the two goals above. When you report any exercise, record the number of minutes, and let me know how many days this week this exercise makes.

## 2017-05-20 ENCOUNTER — Telehealth (INDEPENDENT_AMBULATORY_CARE_PROVIDER_SITE_OTHER): Payer: Self-pay | Admitting: Orthopedic Surgery

## 2017-05-20 NOTE — Telephone Encounter (Signed)
Called patient left message for return call  [phone]

## 2017-05-28 ENCOUNTER — Other Ambulatory Visit: Payer: Self-pay | Admitting: Family Medicine

## 2017-05-28 ENCOUNTER — Ambulatory Visit
Admission: RE | Admit: 2017-05-28 | Discharge: 2017-05-28 | Disposition: A | Discharge status: Home / Self Care | Payer: Medicare Other | Source: Ambulatory Visit | Attending: Family Medicine | Admitting: Family Medicine

## 2017-05-28 DIAGNOSIS — M542 Cervicalgia: Secondary | ICD-10-CM

## 2017-06-17 ENCOUNTER — Ambulatory Visit (INDEPENDENT_AMBULATORY_CARE_PROVIDER_SITE_OTHER): Payer: Medicare Other | Admitting: Family Medicine

## 2017-06-17 ENCOUNTER — Encounter: Payer: Self-pay | Admitting: Family Medicine

## 2017-06-17 DIAGNOSIS — F5000 Anorexia nervosa, unspecified: Secondary | ICD-10-CM

## 2017-06-17 NOTE — Progress Notes (Signed)
Medical Nutrition Therapy:  Appt start time: 1130 end time: 1200. PCP: Kathryne Eriksson, MD Therapist: Rea College, APRN, MSN, CS ([facility] Psychiatrist: [name], MD  Assessment:  Primary concerns today: eating disorder (F50.9).   Blind weight today: 150.6 lb, down 1 lb in past month.      Psycho-social: Aurora has been at her parents' most days, eating lunch and dinner there where she prepares meals for her parents.  She has not shopped for herself, and still has little food at home.  Shinika wrote some answers to the Urge process, which we reviewed today.  She is still unsure exactly how it works, but is starting to make progress with it.  She clearly recognizes that the thought that her parents might not eat if she is not there, and the associated feelings of guilt are frequently the drivers of her deciding to spend the day at her parents', including when it is her sister's day to do so, and Dolorez has much she needs to do at her own house.    Sleep:  A couple weeks ago, Betheny developed a severe cough for which she received a cortisone shot April 24.  Recent 3-4 hrs of sleep per night is an improvement, but she is not sure why sleep is so limited right now.  Does not fall asleep till well after midnight, and wakes up in the night, unable to get back to sleep.    Nutrition: Samaria has been eating three meals/day most of the past week (except a couple wks ago, when she was sick).    Recent physical activity includes going to the gym ~ 3-4 X wk, usually doing ~30 min of exercise (except when she was sick).   Progress:  In Progress.    Nutritional Diagnosis:  Some progress on NB-1.5 Disordered eating pattern as related to weight (and other) anxiety as evidenced by fewer skipped meals during the past week.     Intervention:  Nutrition counseling.     Monitoring/Evaluation:  Dietary intake, body weight, and physical activity in 4 weeks.

## 2017-06-17 NOTE — Patient Instructions (Addendum)
Goals for this week: 1. Grocery shop, using list made today.  2. STAY HOME onCatherine's days at your parents'.  - Work on those home tasks on the days you stay home.   3. Three meals a day and at least one snack.   4. Exercise at least 4 days a week (3 at the gym, and at least one 20-min walk).  - Send progress reports daily using http://www.richardson.info/:  - # of meals & snacks  - Exercise (what & how many minutes)  - Stayed home or went to parents'  - Tally at the end of the week: How many days you had >3 meals; how many exercise days.  Groceries Cereal Oatmeal Nuts/seeds Bread Peanut butter Yogurt Milk Cheese Onion Zucchini Yellow squash Berries Cantaloupe Clementines Vegetarian crumbles Frozen veg's

## 2017-07-22 ENCOUNTER — Encounter: Payer: Self-pay | Admitting: Family Medicine

## 2017-07-22 ENCOUNTER — Ambulatory Visit (INDEPENDENT_AMBULATORY_CARE_PROVIDER_SITE_OTHER): Payer: Medicare Other | Admitting: Orthopedic Surgery

## 2017-07-22 ENCOUNTER — Ambulatory Visit (INDEPENDENT_AMBULATORY_CARE_PROVIDER_SITE_OTHER): Payer: Medicare Other | Admitting: Family Medicine

## 2017-07-22 ENCOUNTER — Encounter (INDEPENDENT_AMBULATORY_CARE_PROVIDER_SITE_OTHER): Payer: Self-pay | Admitting: Orthopedic Surgery

## 2017-07-22 VITALS — Ht 60.0 in | Wt 147.0 lb

## 2017-07-22 DIAGNOSIS — M21611 Bunion of right foot: Secondary | ICD-10-CM

## 2017-07-22 DIAGNOSIS — M205X1 Other deformities of toe(s) (acquired), right foot: Secondary | ICD-10-CM | POA: Diagnosis not present

## 2017-07-22 DIAGNOSIS — F5000 Anorexia nervosa, unspecified: Secondary | ICD-10-CM

## 2017-07-22 NOTE — Progress Notes (Signed)
Medical Nutrition Therapy:  Appt start time: 1130 end time: 1200. PCP: Kathryne Eriksson, MD Therapist: Rea College, APRN, MSN, CS ([facility] Psychiatrist: [name], MD  Assessment:  Primary concerns today: eating disorder (F50.0).   Blind weight today: 150.6 lb, down 1 lb in past month.      Psycho-social: Jeydi has been at her parents' most days; has avoided going there on only one day in past month.  She has been spending less time there when she does go, however, and she is not staying overnight.  She wants to provide company for her mother.    Sana has foot surgery June 25, and she will be recuperating at her parents' house for at least 6 weeks.    Sleep:  Romonia still has a severe cough that keeps her up most nights.  Estimates she gets 3-5 hrs of sleep per night.  Hopes to start a couple of med's Dr. Redmond Pulling prescribed yesterday, but had not called in by the time Devony went to the pharmacy.    Physical activity:  Dennis has been going to the gym at least 3 X wk, and walking her dog for at least 15-20 min daily.  Her workout usually consists of 30 min TM.    Nutrition: Cory has been eating three meals/day most of the past week (except a couple wks ago, when she was sick).    24-hr recall:  (Up at 7:30 AM) B (8:30 AM)-  2 inst oatmeals Snk (10 AM)-  1(+) c mixed fruit L (1 PM)-  1 pimiento chs sandwich, 1 small salad, water Snk (3 PM)-  1 Sp K protein bar, water D (6 PM)-  5 oz flavored yogurt, 1 1/2 c fresh fruit, water Snk ( PM)-   Typical day? Yes.    Recent physical activity includes going to the gym ~ 3-4 X wk, usually doing ~30 min of exercise (except when she was sick).   Progress:  In Progress.    Nutritional Diagnosis:  Some progress on NB-1.5 Disordered eating pattern as related to weight (and other) anxiety as evidenced by fewer skipped meals during the past week.     Intervention:  Nutrition counseling.      Monitoring/Evaluation:  Dietary intake, body weight, and physical activity in 4 weeks.

## 2017-07-22 NOTE — Progress Notes (Signed)
Office Visit Note   Patient: Becky [name]           Date of Birth: 1955-03-19           MRN: [id number] Visit Date: 07/22/2017              Requested by: [name], [address] [place], [place] [address] PCP: [name], MD  Chief Complaint  Patient presents with  . Right Foot - Follow-up    Discuss surgery       HPI: Patient is seen for evaluation for planned bunion and claw toe surgery.  Assessment & Plan: Visit Diagnoses:  1. Bunion of great toe of right foot   2. Claw toe, acquired, right     Plan: Discussed that we would proceed with an Akin osteotomy for the proximal phalanx right great toe a Weil osteotomy for the second metatarsal right foot.  Will require pin for the great toe screw for the second metatarsal and possibly a pin for the second toe.  Risks and benefits of surgery were discussed Postoperative care and treatment was discussed with protected weightbearing. Follow-Up Instructions: Return in about 1 week (around 07/29/2017).   Ortho Exam  Patient is alert, oriented, no adenopathy, well-dressed, normal affect, normal respiratory effort. Examination patient's foot is plantigrade she has good ankle and subtalar motion she is good pulses she has a great toe bunion with overlapping the great toe and second toe with clawing of the second toe with the great toe under the second toe.  Imaging: No results found. No images are attached to the encounter.  Labs: Lab Results  Component Value Date   HGBA1C 5.5 09/13/2009   HGBA1C 5.5 04/06/2008   ESRSEDRATE 2 03/13/2017     No results found for: ALBUMIN, PREALBUMIN, LABURIC  Body mass index is 28.71 kg/m.  Orders:  No orders of the defined types were placed in this encounter.  No orders of the defined types were placed in this encounter.    Procedures: No procedures performed  Clinical Data: No additional findings.  ROS:  All other systems negative, except as noted in  the HPI. Review of Systems  Objective: Vital Signs: Ht 5' (1.524 m)   Wt 147 lb (66.7 kg)   BMI 28.71 kg/m   Specialty Comments:  No specialty comments available.  PMFS History: Patient Active Problem List   Diagnosis Date Noted  . MCI (mild cognitive impairment) 03/13/2017  . Bunion of great toe of right foot 05/21/2016  . Knee pain, right 11/19/2014  . Right hip pain 11/19/2014  . Chronic cough 06/15/2014  . HTN (hypertension) 06/15/2014  . Osteopenia determined by [facility] 02/18/2014  . Lumbar foraminal stenosis 12/07/2013  . Benign paroxysmal positional vertigo 03/01/2013  . Nonsuppurative otitis media, not specified as acute or chronic 03/01/2013  . 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 Diabetes mellitus 07/01/2011  . Asthma, moderate persistent 01/15/2011  . Osteoarthrosis, unspecified whether generalized or localized, involving lower leg 10/30/2009  . Backache 10/30/2009  . ORTHOSTATIC HYPOTENSION 05/17/2008  . EATING DISORDER, UNSPECIFIED 11/10/2007  . HYPERGLYCEMIA 02/23/2007  . GASTROPARESIS 04/25/2006   Past Medical History:  Diagnosis Date  . Anemia   . Anorexia nervosa   . Anxiety   . Arthritis    bilateral knees  . Asthma    mainly problems in cold weather  . Chronic pain syndrome   . Depression   . Diabetes mellitus   . Encounter  for long-term (current) use of other medications   . Gastroparesis    botox injections  . GERD (gastroesophageal reflux disease)   . History of blood transfusion   . Hyperglycemia   . Hypertension   . MCI (mild cognitive impairment) 03/13/2017  . Migraine   . Migraine   . Multiple allergies   . Murmur, cardiac    seen cardiologist in past - no workup required  . Neuropathy    right foot  . Painful respiration   . Personality change due to conditions classified elsewhere   . Raynaud's syndrome   . Tremor   . Ventral hernia   . Vitamin D deficiency     Family History  Problem Relation Age of Onset    . Urinary tract infection Mother   . Sleep disorder Mother   . Dementia Mother   . Diabetes Father   . High blood pressure Father   . Cancer - Prostate Father   . Dementia Father   . Emphysema Paternal Grandfather        was a smoker    Past Surgical History:  Procedure Laterality Date  . BOTOX INJECTION N/A 11/18/2012   Procedure: BOTOX INJECTION;  Surgeon: Missy Sabins, MD;  [facility];  Service: Endoscopy;  Laterality: N/A;  . BOTOX INJECTION N/A 09/30/2013   Procedure: BOTOX INJECTION;  Surgeon: Missy Sabins, MD;  [facility];  Service: Endoscopy;  Laterality: N/A;  . BOTOX INJECTION N/A 03/29/2015   Procedure: BOTOX INJECTION;  Surgeon: Teena Irani, MD;  Location: [facility] [place];  Service: Endoscopy;  Laterality: N/A;  . BREAST SURGERY    . CHOLECYSTECTOMY    . COLONOSCOPY    . COLONOSCOPY WITH PROPOFOL N/A 03/29/2015   Procedure: COLONOSCOPY WITH PROPOFOL;  Surgeon: Teena Irani, MD;  Location: [facility];  Service: Endoscopy;  Laterality: N/A;  . ESOPHAGOGASTRODUODENOSCOPY  08/20/2011   Procedure: ESOPHAGOGASTRODUODENOSCOPY (EGD);  Surgeon: Missy Sabins, MD;  Location[facility];  Service: Endoscopy;  Laterality: N/A;  . ESOPHAGOGASTRODUODENOSCOPY N/A 11/18/2012   Procedure: ESOPHAGOGASTRODUODENOSCOPY (EGD);  Surgeon: Missy Sabins, MD;  Location: [facility];  Service: Endoscopy;  Laterality: N/A;  . ESOPHAGOGASTRODUODENOSCOPY N/A 09/30/2013   Procedure: ESOPHAGOGASTRODUODENOSCOPY (EGD);  Surgeon: Missy Sabins, MD;  Location: [facility];  Service: Endoscopy;  Laterality: N/A;  . ESOPHAGOGASTRODUODENOSCOPY (EGD) WITH PROPOFOL N/A 03/29/2015   Procedure: ESOPHAGOGASTRODUODENOSCOPY (EGD) WITH PROPOFOL;  Surgeon: Teena Irani, MD;  Location: [facility];  Service: Endoscopy;  Laterality: N/A;  . HERNIA REPAIR    . LUMBAR LAMINECTOMY/DECOMPRESSION MICRODISCECTOMY Right 12/07/2013   Procedure: LUMBAR LAMINECTOMY/DECOMPRESSION MICRODISCECTOMY RIGHT  LUMBAR  FIVE-SACRAL ONE ,FORAMINOTOMY;  Surgeon: Floyce Stakes, MD;  Location: [facility];  Service: Neurosurgery;  Laterality: Right;  . NISSEN FUNDOPLICATION  2683   Kaylyn Lim  . PEG TUBE PLACEMENT     removed 1996  . TONSILLECTOMY AND ADENOIDECTOMY     Social History   Occupational History  . Occupation: Disabled    Employer: UNEMPLOYED  Tobacco Use  . Smoking status: Former Smoker    Packs/day: 0.50    Years: 2.00    Pack years: 1.00    Types: Cigarettes    Last attempt to quit: 02/11/1993    Years since quitting: 24.4  . Smokeless tobacco: Never Used  Substance and Sexual Activity  . Alcohol use: No    Comment: quit ETOH in 1982  . Drug use: No  . Sexual activity: Not on file

## 2017-07-22 NOTE — Patient Instructions (Signed)
Goals:  - Three meals a day and at least one snack.    - With your upcoming surgery, you need good nutrition to help heal well.  Pay attention to getting a balance of protein, car, and veg's at lunch and dinner.   - Exercise at least 4 days a week at the gym, and walk [name] daily.  - Add at least 10 minutes of strength training to your 30 min of cardio.    - Determine a home exercise routine for once you are able to start exercising again following surgery     (however minimal).   - Explain to Dr. Sharol Given that getting driven to PT is likely to be difficult, so you'll need a home routine.   - Send progress reports daily using http://www.richardson.info/:             - # of meals & snacks             - Exercise (what & how many minutes)             - Tally at the end of the week: How many days you had >3 meals; how many exercise days.

## 2017-07-29 ENCOUNTER — Encounter: Payer: Medicare Other | Admitting: Psychology

## 2017-07-29 ENCOUNTER — Encounter: Payer: Self-pay | Admitting: Orthopedic Surgery

## 2017-07-29 DIAGNOSIS — M2011 Hallux valgus (acquired), right foot: Secondary | ICD-10-CM

## 2017-07-29 DIAGNOSIS — M2041 Other hammer toe(s) (acquired), right foot: Secondary | ICD-10-CM

## 2017-08-07 ENCOUNTER — Encounter (INDEPENDENT_AMBULATORY_CARE_PROVIDER_SITE_OTHER): Payer: Self-pay | Admitting: Orthopedic Surgery

## 2017-08-07 ENCOUNTER — Ambulatory Visit (INDEPENDENT_AMBULATORY_CARE_PROVIDER_SITE_OTHER): Payer: Medicare Other | Admitting: Orthopedic Surgery

## 2017-08-07 VITALS — Ht 60.0 in | Wt 147.0 lb

## 2017-08-07 DIAGNOSIS — M205X1 Other deformities of toe(s) (acquired), right foot: Secondary | ICD-10-CM

## 2017-08-07 DIAGNOSIS — M21611 Bunion of right foot: Secondary | ICD-10-CM

## 2017-08-07 NOTE — Progress Notes (Signed)
Office Visit Note   Patient: Becky [name]           Date of Birth: Mar 17, 1955           MRN: [id number] Visit Date: 08/07/2017              Requested by: [name], [place] [address], [place] [address] PCP: [name], MD  Chief Complaint  Patient presents with  . Right Foot - Routine Post Op      HPI: Patient is a 62 year old woman status post claw toe surgery and bunion surgery right foot.  Assessment & Plan: Visit Diagnoses:  1. Bunion of great toe of right foot   2. Claw toe, acquired, right     Plan: Begin Dial soap cleansing continue with her crutches and postoperative shoe dry dressing change follow-up in 1 week to harvest the sutures and remove the pins.  Follow-Up Instructions: Return in about 1 week (around 08/14/2017).   Ortho Exam  Patient is alert, oriented, no adenopathy, well-dressed, normal affect, normal respiratory effort. Examination the incisions are healing nicely there is no redness no cellulitis no signs of infection.  Imaging: No results found. No images are attached to the encounter.  Labs: Lab Results  Component Value Date   HGBA1C 5.5 09/13/2009   HGBA1C 5.5 04/06/2008   ESRSEDRATE 2 03/13/2017     No results found for: ALBUMIN, PREALBUMIN, LABURIC  Body mass index is 28.71 kg/m.  Orders:  No orders of the defined types were placed in this encounter.  No orders of the defined types were placed in this encounter.    Procedures: No procedures performed  Clinical Data: No additional findings.  ROS:  All other systems negative, except as noted in the HPI. Review of Systems  Objective: Vital Signs: Ht 5' (1.524 m)   Wt 147 lb (66.7 kg)   BMI 28.71 kg/m   Specialty Comments:  No specialty comments available.  PMFS History: Patient Active Problem List   Diagnosis Date Noted  . MCI (mild cognitive impairment) 03/13/2017  . Bunion of great toe of right foot 05/21/2016  . Knee pain, right  11/19/2014  . Right hip pain 11/19/2014  . Chronic cough 06/15/2014  . HTN (hypertension) 06/15/2014  . Osteopenia determined by [facility] 02/18/2014  . Lumbar foraminal stenosis 12/07/2013  . Benign paroxysmal positional vertigo 03/01/2013  . Nonsuppurative otitis media, not specified as acute or chronic 03/01/2013  . 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 Diabetes mellitus 07/01/2011  . Asthma, moderate persistent 01/15/2011  . Osteoarthrosis, unspecified whether generalized or localized, involving lower leg 10/30/2009  . Backache 10/30/2009  . ORTHOSTATIC HYPOTENSION 05/17/2008  . EATING DISORDER, UNSPECIFIED 11/10/2007  . HYPERGLYCEMIA 02/23/2007  . GASTROPARESIS 04/25/2006   Past Medical History:  Diagnosis Date  . Anemia   . Anorexia nervosa   . Anxiety   . Arthritis    bilateral knees  . Asthma    mainly problems in cold weather  . Chronic pain syndrome   . Depression   . Diabetes mellitus   . Encounter for long-term (current) use of other medications   . Gastroparesis    botox injections  . GERD (gastroesophageal reflux disease)   . History of blood transfusion   . Hyperglycemia   . Hypertension   . MCI (mild cognitive impairment) 03/13/2017  . Migraine   . Migraine   . Multiple allergies   . Murmur, cardiac  seen cardiologist in past - no workup required  . Neuropathy    right foot  . Painful respiration   . Personality change due to conditions classified elsewhere   . Raynaud's syndrome   . Tremor   . Ventral hernia   . Vitamin D deficiency     Family History  Problem Relation Age of Onset  . Urinary tract infection Mother   . Sleep disorder Mother   . Dementia Mother   . Diabetes Father   . High blood pressure Father   . Cancer - Prostate Father   . Dementia Father   . Emphysema Paternal Grandfather        was a smoker    Past Surgical History:  Procedure Laterality Date  . BOTOX INJECTION N/A 11/18/2012   Procedure: BOTOX  INJECTION;  Surgeon: Missy Sabins, MD;  [facility];  Service: Endoscopy;  Laterality: N/A;  . BOTOX INJECTION N/A 09/30/2013   Procedure: BOTOX INJECTION;  Surgeon: Missy Sabins, MD;  [facility];  Service: Endoscopy;  Laterality: N/A;  . BOTOX INJECTION N/A 03/29/2015   Procedure: BOTOX INJECTION;  Surgeon: Teena Irani, MD;  Location: [place];  Service: Endoscopy;  Laterality: N/A;  . BREAST SURGERY    . CHOLECYSTECTOMY    . COLONOSCOPY    . COLONOSCOPY WITH PROPOFOL N/A 03/29/2015   Procedure: COLONOSCOPY WITH PROPOFOL;  Surgeon: Teena Irani, MD;  Location: [place];  Service: Endoscopy;  Laterality: N/A;  . ESOPHAGOGASTRODUODENOSCOPY  08/20/2011   Procedure: ESOPHAGOGASTRODUODENOSCOPY (EGD);  Surgeon: Missy Sabins, MD;  Location: [facility];  Service: Endoscopy;  Laterality: N/A;  . ESOPHAGOGASTRODUODENOSCOPY N/A 11/18/2012   Procedure: ESOPHAGOGASTRODUODENOSCOPY (EGD);  Surgeon: Missy Sabins, MD;  [facility];  Service: Endoscopy;  Laterality: N/A;  . ESOPHAGOGASTRODUODENOSCOPY N/A 09/30/2013   Procedure: ESOPHAGOGASTRODUODENOSCOPY (EGD);  Surgeon: Missy Sabins, MD;  Location[facility];  Service: Endoscopy;  Laterality: N/A;  . ESOPHAGOGASTRODUODENOSCOPY (EGD) WITH PROPOFOL N/A 03/29/2015   Procedure: ESOPHAGOGASTRODUODENOSCOPY (EGD) WITH PROPOFOL;  Surgeon: Teena Irani, MD;  Location: [place];  Service: Endoscopy;  Laterality: N/A;  . HERNIA REPAIR    . LUMBAR LAMINECTOMY/DECOMPRESSION MICRODISCECTOMY Right 12/07/2013   Procedure: LUMBAR LAMINECTOMY/DECOMPRESSION MICRODISCECTOMY RIGHT  LUMBAR FIVE-SACRAL ONE ,FORAMINOTOMY;  Surgeon: Floyce Stakes, MD;  [facility]: [facility];  Service: Neurosurgery;  Laterality: Right;  . NISSEN FUNDOPLICATION  1937   Kaylyn Lim  . PEG TUBE PLACEMENT     removed 1996  . TONSILLECTOMY AND ADENOIDECTOMY     Social History   Occupational History  . Occupation: Disabled    Employer: UNEMPLOYED  Tobacco  Use  . Smoking status: Former Smoker    Packs/day: 0.50    Years: 2.00    Pack years: 1.00    Types: Cigarettes    Last attempt to quit: 02/11/1993    Years since quitting: 24.5  . Smokeless tobacco: Never Used  Substance and Sexual Activity  . Alcohol use: No    Comment: quit ETOH in 1982  . Drug use: No  . Sexual activity: Not on file

## 2017-08-13 ENCOUNTER — Ambulatory Visit (INDEPENDENT_AMBULATORY_CARE_PROVIDER_SITE_OTHER): Payer: Medicare Other | Admitting: Family

## 2017-08-13 ENCOUNTER — Inpatient Hospital Stay (INDEPENDENT_AMBULATORY_CARE_PROVIDER_SITE_OTHER): Payer: Medicare Other

## 2017-08-13 ENCOUNTER — Ambulatory Visit (INDEPENDENT_AMBULATORY_CARE_PROVIDER_SITE_OTHER): Payer: Self-pay

## 2017-08-13 ENCOUNTER — Encounter (INDEPENDENT_AMBULATORY_CARE_PROVIDER_SITE_OTHER): Payer: Self-pay | Admitting: Family

## 2017-08-13 DIAGNOSIS — M79671 Pain in right foot: Secondary | ICD-10-CM

## 2017-08-13 DIAGNOSIS — M21611 Bunion of right foot: Secondary | ICD-10-CM

## 2017-08-13 NOTE — Progress Notes (Signed)
Post-Op Visit Note   Patient: Becky [name]           Date of Birth: Aug 24, 1955           MRN: [id number] Visit Date: 08/13/2017 PCP: [name], MD  Chief Complaint:  Chief Complaint  Patient presents with  . Right Foot - Routine Post Op    DOS 07/29/17 Right foot bunion surgery and hammertoe correction    HPI:  HPI  Patient is a 62 year old woman seen today 2 weeks status post right bunion repair and hammer toe surgery. Pins in place. Has been non weight bearing on crutches with post op shoe.   Ortho Exam Incisions are well healed. No erythema drainage, or open areas. Pins in place. Great toe is straight.  Visit Diagnoses:  1. Pain in right foot   2. Bunion of great toe of right foot     Plan: pins harvested. Sutures harvested. Cleanse daily with dial soap. Apply dry dressings. May begin full weight bearing in post op shoe. Follow up in office in 2 weeks.   Follow-Up Instructions: Return in about 2 weeks (around 08/27/2017).   Imaging: No results found.  Orders:  Orders Placed This Encounter  Procedures  . XR Foot Complete Right   No orders of the defined types were placed in this encounter.    PMFS History: Patient Active Problem List   Diagnosis Date Noted  . MCI (mild cognitive impairment) 03/13/2017  . Bunion of great toe of right foot 05/21/2016  . Knee pain, right 11/19/2014  . Right hip pain 11/19/2014  . Chronic cough 06/15/2014  . HTN (hypertension) 06/15/2014  . Osteopenia determined by [facility] 02/18/2014  . Lumbar foraminal stenosis 12/07/2013  . Benign paroxysmal positional vertigo 03/01/2013  . Nonsuppurative otitis media, not specified as acute or chronic 03/01/2013  . 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 Diabetes mellitus 07/01/2011  . Asthma, moderate persistent 01/15/2011  . Osteoarthrosis, unspecified whether generalized or localized, involving lower leg 10/30/2009  . Backache 10/30/2009  . ORTHOSTATIC  HYPOTENSION 05/17/2008  . EATING DISORDER, UNSPECIFIED 11/10/2007  . HYPERGLYCEMIA 02/23/2007  . GASTROPARESIS 04/25/2006   Past Medical History:  Diagnosis Date  . Anemia   . Anorexia nervosa   . Anxiety   . Arthritis    bilateral knees  . Asthma    mainly problems in cold weather  . Chronic pain syndrome   . Depression   . Diabetes mellitus   . Encounter for long-term (current) use of other medications   . Gastroparesis    botox injections  . GERD (gastroesophageal reflux disease)   . History of blood transfusion   . Hyperglycemia   . Hypertension   . MCI (mild cognitive impairment) 03/13/2017  . Migraine   . Migraine   . Multiple allergies   . Murmur, cardiac    seen cardiologist in past - no workup required  . Neuropathy    right foot  . Painful respiration   . Personality change due to conditions classified elsewhere   . Raynaud's syndrome   . Tremor   . Ventral hernia   . Vitamin D deficiency     Family History  Problem Relation Age of Onset  . Urinary tract infection Mother   . Sleep disorder Mother   . Dementia Mother   . Diabetes Father   . High blood pressure Father   . Cancer - Prostate Father   . Dementia Father   .  Emphysema Paternal Grandfather        was a smoker    Past Surgical History:  Procedure Laterality Date  . BOTOX INJECTION N/A 11/18/2012   Procedure: BOTOX INJECTION;  Surgeon: Missy Sabins, MD;  [facility];  Service: Endoscopy;  Laterality: N/A;  . BOTOX INJECTION N/A 09/30/2013   Procedure: BOTOX INJECTION;  Surgeon: Missy Sabins, MD;  [facility];  Service: Endoscopy;  Laterality: N/A;  . BOTOX INJECTION N/A 03/29/2015   Procedure: BOTOX INJECTION;  Surgeon: Teena Irani, MD;  Location: [place];  Service: Endoscopy;  Laterality: N/A;  . BREAST SURGERY    . CHOLECYSTECTOMY    . COLONOSCOPY    . COLONOSCOPY WITH PROPOFOL N/A 03/29/2015   Procedure: COLONOSCOPY WITH PROPOFOL;  Surgeon: Teena Irani, MD;   Location: [place];  Service: Endoscopy;  Laterality: N/A;  . ESOPHAGOGASTRODUODENOSCOPY  08/20/2011   Procedure: ESOPHAGOGASTRODUODENOSCOPY (EGD);  Surgeon: Missy Sabins, MD;  Location: [facility];  Service: Endoscopy;  Laterality: N/A;  . ESOPHAGOGASTRODUODENOSCOPY N/A 11/18/2012   Procedure: ESOPHAGOGASTRODUODENOSCOPY (EGD);  Surgeon: Missy Sabins, MD;  [facility];  Service: Endoscopy;  Laterality: N/A;  . ESOPHAGOGASTRODUODENOSCOPY N/A 09/30/2013   Procedure: ESOPHAGOGASTRODUODENOSCOPY (EGD);  Surgeon: Missy Sabins, MD;  Location[facility];  Service: Endoscopy;  Laterality: N/A;  . ESOPHAGOGASTRODUODENOSCOPY (EGD) WITH PROPOFOL N/A 03/29/2015   Procedure: ESOPHAGOGASTRODUODENOSCOPY (EGD) WITH PROPOFOL;  Surgeon: Teena Irani, MD;  Location: [place];  Service: Endoscopy;  Laterality: N/A;  . HERNIA REPAIR    . LUMBAR LAMINECTOMY/DECOMPRESSION MICRODISCECTOMY Right 12/07/2013   Procedure: LUMBAR LAMINECTOMY/DECOMPRESSION MICRODISCECTOMY RIGHT  LUMBAR FIVE-SACRAL ONE ,FORAMINOTOMY;  Surgeon: Floyce Stakes, MD;  [facility];  Service: Neurosurgery;  Laterality: Right;  . NISSEN FUNDOPLICATION  [id number]   Kaylyn Lim  . PEG TUBE PLACEMENT     removed 1996  . TONSILLECTOMY AND ADENOIDECTOMY     Social History   Occupational History  . Occupation: Disabled    Employer: UNEMPLOYED  Tobacco Use  . Smoking status: Former Smoker    Packs/day: 0.50    Years: 2.00    Pack years: 1.00    Types: Cigarettes    Last attempt to quit: 02/11/1993    Years since quitting: 24.5  . Smokeless tobacco: Never Used  Substance and Sexual Activity  . Alcohol use: No    Comment: quit ETOH in 1982  . Drug use: No  . Sexual activity: Not on file

## 2017-08-19 ENCOUNTER — Ambulatory Visit: Payer: Medicaid Other | Admitting: Family Medicine

## 2017-08-21 ENCOUNTER — Ambulatory Visit (INDEPENDENT_AMBULATORY_CARE_PROVIDER_SITE_OTHER): Payer: Medicare Other | Admitting: Family Medicine

## 2017-08-21 DIAGNOSIS — F5001 Anorexia nervosa, restricting type: Secondary | ICD-10-CM

## 2017-08-21 NOTE — Patient Instructions (Signed)
-   Sleep: Do NOT start working on taxes or financial documents during the night when you can't sleep.  Instead, do some some of your kaleidoscope projects, draw, or read a good book or magazine, or even watch TV.    - While at your parents:  Put in a request from [facility] for vegetables, including frozen!  - When you return home, make sure you do a grocery run first!  For now, goals remain the same: 1. 3 real meals per day.   2. Do at least 10 minutes of exercise 5 times per week.   3. Record # of meals per day on http://www.richardson.info/, including an end-of-week tally.  (It does not count if it's not a REAL meal.)

## 2017-08-21 NOTE — Progress Notes (Signed)
Medical Nutrition Therapy:  Appt start time: 1130 end time: 1200. PCP: Kathryne Eriksson, MD Therapist: Rea College, APRN, MSN, CS ([facility] Psychiatrist: [name], MD  Assessment:  Primary concerns today: eating disorder (F50.0).   Blind weight today: 150.6 lb, down 1 lb in past month.      Becky [name] had surgery for bunion and claw toe June 25.  She is now able to bear weight in a post-op boot, which has obviously limited physical activity.  She has been staying at her parents' since surgery.  She hopes to go home in the next few days.    [name] has not been recording progress on her goal of 3 meals/day.  She has been worried about weight gain since not being able to exercise much.  Said she has been usually getting 2-3 meals/day.    Sleep: Minimal; difficulty falling asleep till ~4 AM.  Worries about money during the night.    Chronic cough: Gone for a couple weeks, but back as of yesterday.    Physical activity:  None since surgery.  Had hoped to do some exercises at home, but has not started any so far.  Has received no guidance on this from surgeon's office.    24-hr recall:  (Up at 6 AM) B (7 AM)-  1 c Sp K Protein cereal, 1/2 c almond milk Snk ( AM)-  water L (2 PM)-  1 American cheese sandwich, water Snk ( PM)-  water D (6:30 PM)-  1 baked potato, 1 oz provolone, water Snk ( PM)-  --- Typical day? Yes.    Progress:  In Progress.    Nutritional Diagnosis:  No progress on NB-1.5 Disordered eating pattern as related to weight (and other) anxiety as evidenced by many days in which there were <3 meals/day during the past few weeks.     Intervention:  Nutrition counseling.     Monitoring/Evaluation:  Dietary intake, body weight, and physical activity in 4 weeks.

## 2017-08-25 ENCOUNTER — Encounter: Payer: Medicare Other | Admitting: Psychology

## 2017-08-28 ENCOUNTER — Telehealth (INDEPENDENT_AMBULATORY_CARE_PROVIDER_SITE_OTHER): Payer: Self-pay | Admitting: Orthopedic Surgery

## 2017-08-28 ENCOUNTER — Other Ambulatory Visit (INDEPENDENT_AMBULATORY_CARE_PROVIDER_SITE_OTHER): Payer: Self-pay

## 2017-08-28 DIAGNOSIS — M21611 Bunion of right foot: Secondary | ICD-10-CM

## 2017-08-28 DIAGNOSIS — M205X1 Other deformities of toe(s) (acquired), right foot: Secondary | ICD-10-CM

## 2017-08-28 DIAGNOSIS — M79671 Pain in right foot: Secondary | ICD-10-CM

## 2017-08-28 NOTE — Telephone Encounter (Signed)
Ok per MD for physical therapy eval and treat for right foot pain s/p bunion and claw toe surgery. Called pt to advise and she can call and make her appt did not have preference for therapy office so referral to [facility] out pt office on [address].

## 2017-08-28 NOTE — Telephone Encounter (Signed)
Patient called and requested to be referred to a physical therapist.  [phone].  Thank you.

## 2017-09-04 ENCOUNTER — Ambulatory Visit (INDEPENDENT_AMBULATORY_CARE_PROVIDER_SITE_OTHER): Payer: Medicare Other | Admitting: Orthopedic Surgery

## 2017-09-10 ENCOUNTER — Ambulatory Visit: Payer: Medicare Other | Admitting: Neurology

## 2017-09-10 ENCOUNTER — Ambulatory Visit (INDEPENDENT_AMBULATORY_CARE_PROVIDER_SITE_OTHER): Payer: Self-pay

## 2017-09-10 ENCOUNTER — Ambulatory Visit (INDEPENDENT_AMBULATORY_CARE_PROVIDER_SITE_OTHER): Payer: Medicare Other | Admitting: Family

## 2017-09-10 ENCOUNTER — Encounter (INDEPENDENT_AMBULATORY_CARE_PROVIDER_SITE_OTHER): Payer: Self-pay | Admitting: Family

## 2017-09-10 VITALS — Ht 60.0 in | Wt 147.0 lb

## 2017-09-10 DIAGNOSIS — M205X1 Other deformities of toe(s) (acquired), right foot: Secondary | ICD-10-CM | POA: Insufficient documentation

## 2017-09-10 DIAGNOSIS — M21611 Bunion of right foot: Secondary | ICD-10-CM

## 2017-09-10 NOTE — Progress Notes (Signed)
Post-Op Visit Note   Patient: Becky [name]           Date of Birth: 10/10/1955           MRN: [id number] Visit Date: 09/10/2017 PCP: [name], MD  Chief Complaint:  Chief Complaint  Patient presents with  . Right Foot - Routine Post Op    07/29/17 right foot bunion hammertoe surgery    HPI:  HPI the patient is a 62 year old woman 6 weeks status post right foot bunion and hammertoe repair on the right today.  She is complaining today of pain in the second toe with weightbearing.  States she is pain-free in a postop shoe.  Does have some pain in regular shoewear.  She is in [employer].   Ortho Exam On examination incisions are well-healed.  There is no elevation of the second toe.  Toes are straight. there is no erythema no sign of infection.  Visit Diagnoses:  1. Bunion of great toe of right foot   2. Claw toe, right     Plan: Discussed scar massage.  Patient to work on scar massage and downward pressure on the second toe.  Continue stiff shoe wear such as her [place] walking sneakers.  May wear the postop shoe if she prefers.  To follow-up in office in 3 weeks if continued pain.  Follow-Up Instructions: No follow-ups on file.   Imaging: No results found.  Orders:  Orders Placed This Encounter  Procedures  . XR Foot 2 Views Right   No orders of the defined types were placed in this encounter.    PMFS History: Patient Active Problem List   Diagnosis Date Noted  . Claw toe, right 09/10/2017  . MCI (mild cognitive impairment) 03/13/2017  . Bunion of great toe of right foot 05/21/2016  . Knee pain, right 11/19/2014  . Right hip pain 11/19/2014  . Chronic cough 06/15/2014  . HTN (hypertension) 06/15/2014  . Osteopenia determined by [facility] 02/18/2014  . Lumbar foraminal stenosis 12/07/2013  . Benign paroxysmal positional vertigo 03/01/2013  . Nonsuppurative otitis media, not specified as acute or chronic 03/01/2013  . Dissociative identity disorder  ([place]) 01/17/2012  . Diabetes mellitus 07/01/2011  . Asthma, moderate persistent 01/15/2011  . Osteoarthrosis, unspecified whether generalized or localized, involving lower leg 10/30/2009  . Backache 10/30/2009  . ORTHOSTATIC HYPOTENSION 05/17/2008  . EATING DISORDER, UNSPECIFIED 11/10/2007  . HYPERGLYCEMIA 02/23/2007  . GASTROPARESIS 04/25/2006   Past Medical History:  Diagnosis Date  . Anemia   . Anorexia nervosa   . Anxiety   . Arthritis    bilateral knees  . Asthma    mainly problems in cold weather  . Chronic pain syndrome   . Depression   . Diabetes mellitus   . Encounter for long-term (current) use of other medications   . Gastroparesis    botox injections  . GERD (gastroesophageal reflux disease)   . History of blood transfusion   . Hyperglycemia   . Hypertension   . MCI (mild cognitive impairment) 03/13/2017  . Migraine   . Migraine   . Multiple allergies   . Murmur, cardiac    seen cardiologist in past - no workup required  . Neuropathy    right foot  . Painful respiration   . Personality change due to conditions classified elsewhere   . Raynaud's syndrome   . Tremor   . Ventral hernia   . Vitamin D deficiency     Family History  Problem Relation Age of Onset  . Urinary tract infection Mother   . Sleep disorder Mother   . Dementia Mother   . Diabetes Father   . High blood pressure Father   . Cancer - Prostate Father   . Dementia Father   . Emphysema Paternal Grandfather        was a smoker    Past Surgical History:  Procedure Laterality Date  . BOTOX INJECTION N/A 11/18/2012   Procedure: BOTOX INJECTION;  Surgeon: Missy Sabins, MD;  [facility];  Service: Endoscopy;  Laterality: N/A;  . BOTOX INJECTION N/A 09/30/2013   Procedure: BOTOX INJECTION;  Surgeon: Missy Sabins, MD;  [facility];  Service: Endoscopy;  Laterality: N/A;  . BOTOX INJECTION N/A 03/29/2015   Procedure: BOTOX INJECTION;  Surgeon: Teena Irani, MD;  Location: [place];  Service: Endoscopy;  Laterality: N/A;  . BREAST SURGERY    . CHOLECYSTECTOMY    . COLONOSCOPY    . COLONOSCOPY WITH PROPOFOL N/A 03/29/2015   Procedure: COLONOSCOPY WITH PROPOFOL;  Surgeon: Teena Irani, MD;  Location: [place];  Service: Endoscopy;  Laterality: N/A;  . ESOPHAGOGASTRODUODENOSCOPY  08/20/2011   Procedure: ESOPHAGOGASTRODUODENOSCOPY (EGD);  Surgeon: Missy Sabins, MD;  Location: [facility]COPY;  Service: Endoscopy;  Laterality: N/A;  . ESOPHAGOGASTRODUODENOSCOPY N/A 11/18/2012   Procedure: ESOPHAGOGASTRODUODENOSCOPY (EGD);  Surgeon: Missy Sabins, MD;  [facility];  Service: Endoscopy;  Laterality: N/A;  . ESOPHAGOGASTRODUODENOSCOPY N/A 09/30/2013   Procedure: ESOPHAGOGASTRODUODENOSCOPY (EGD);  Surgeon: Missy Sabins, MD;  [facility];  Service: Endoscopy;  Laterality: N/A;  . ESOPHAGOGASTRODUODENOSCOPY (EGD) WITH PROPOFOL N/A 03/29/2015   Procedure: ESOPHAGOGASTRODUODENOSCOPY (EGD) WITH PROPOFOL;  Surgeon: Teena Irani, MD;  Location: [place];  Service: Endoscopy;  Laterality: N/A;  . HERNIA REPAIR    . LUMBAR LAMINECTOMY/DECOMPRESSION MICRODISCECTOMY Right 12/07/2013   Procedure: LUMBAR LAMINECTOMY/DECOMPRESSION MICRODISCECTOMY RIGHT  LUMBAR FIVE-SACRAL ONE ,FORAMINOTOMY;  Surgeon: Floyce Stakes, MD;  [facility];  Service: Neurosurgery;  Laterality: Right;  . NISSEN FUNDOPLICATION  [id number]   Kaylyn Lim  . PEG TUBE PLACEMENT     removed 1996  . TONSILLECTOMY AND ADENOIDECTOMY     Social History   Occupational History  . Occupation: Disabled    Employer: UNEMPLOYED  Tobacco Use  . Smoking status: Former Smoker    Packs/day: 0.50    Years: 2.00    Pack years: 1.00    Types: Cigarettes    Last attempt to quit: 02/11/1993    Years since quitting: 24.5  . Smokeless tobacco: Never Used  Substance and Sexual Activity  . Alcohol use: No    Comment: quit ETOH in 1982  . Drug use: No  . Sexual activity: Not on file

## 2017-09-23 ENCOUNTER — Encounter: Payer: Self-pay | Admitting: Family Medicine

## 2017-09-23 ENCOUNTER — Ambulatory Visit (INDEPENDENT_AMBULATORY_CARE_PROVIDER_SITE_OTHER): Payer: Medicare Other | Admitting: Family Medicine

## 2017-09-23 DIAGNOSIS — F5000 Anorexia nervosa, unspecified: Secondary | ICD-10-CM

## 2017-09-23 DIAGNOSIS — F5001 Anorexia nervosa, restricting type: Secondary | ICD-10-CM

## 2017-09-23 NOTE — Patient Instructions (Addendum)
-   TAKE A NAP WHEN YOU FEEL YOU NEED IT!  Aim for being awake no later than 3 PM.    - When you go to the PT, ask about a program of home exercises you can do to help you get some fitness back as well as to rehab your foot.  ([place] on [address]): [phone]. . Call today for an appt!  Grocery shop no later than Thursday.    Recommendations: 1. Eat at least 3 real meals/day and 1-2 snacks.    - Make sure each meal includes protein, some form of starch, and veg's and/or fruit.    - If using beans as your protein source, get at least one full cup per serving.  Also, get some variety in    protein sources.   2. Make sure both your house and your parents' house have plenty of vegetables available, AND USE THEM.  3. Please record number of meals per day in http://www.richardson.info/, including a weekly average or total.

## 2017-09-23 NOTE — Progress Notes (Signed)
Medical Nutrition Therapy:  Appt start time: 1130 end time: 1200. PCP: Kathryne Eriksson, MD Therapist: Rea College, APRN, MSN, CS ([facility] Psychiatrist: [name], MD  Assessment:  Primary concerns today: eating disorder (F50.0, anorexia, restricting type).   Blind weight today: 139.2 lb   [name] had surgery for bunion and claw toe June 25.  Surgery site has not healed as hoped, and it has been quite painful.  Saundra saw the orthopedic PA, and was instructed to massage it several times a day, which she has been doing, but pain has not dissipated.  She is back home now after nearly 8 weeks.  She has not yet bought groceries for her own condo b/c she is still spending the day at her parents' and eating meals there.    Dyamond has been usually getting 2-3 meals/day.    Sleep: Minimal; estimates she gets ~4 hrs/night.   Chronic cough: Same; medication doesn't help much.      Physical activity:  Had started to do a few minutes of home exercises that did not involve her foot, but has discontinued all exercise since seeing orthopedic PA.   24-hr recall:  (Up at 7 AM; drank water) B (10:30 AM)-  2 pkt plain inst oatmeal, water Snk ( AM)-  water L (2 PM)-  1 pb&banana sandwich, 1/4 c peanuts, water Snk (4 PM)-  1 Sp K protein bar, water D (6:30 PM)-  2 scrmbld eggs, 1 oz chs, 1 dry toast, 2 rings canned pineapple Snk ( PM)-  Diet Gatorade  Typical day? Yes.    Progress:  In Progress.    Nutritional Diagnosis:  No progress on NB-1.5 Disordered eating pattern as related to weight (and other) anxiety as evidenced by many days in which there were <3 meals/day during the past several weeks, as well as weight loss of 88 lb on about 1 month.     Intervention:  Nutrition counseling.     Monitoring/Evaluation:  Dietary intake, body weight, and physical activity in 4 weeks.

## 2017-09-24 ENCOUNTER — Ambulatory Visit (INDEPENDENT_AMBULATORY_CARE_PROVIDER_SITE_OTHER): Payer: Medicare Other | Admitting: Family

## 2017-09-24 ENCOUNTER — Encounter (INDEPENDENT_AMBULATORY_CARE_PROVIDER_SITE_OTHER): Payer: Self-pay | Admitting: Family

## 2017-09-24 VITALS — Ht 60.25 in | Wt 139.0 lb

## 2017-09-24 DIAGNOSIS — M21611 Bunion of right foot: Secondary | ICD-10-CM

## 2017-09-24 DIAGNOSIS — M205X1 Other deformities of toe(s) (acquired), right foot: Secondary | ICD-10-CM | POA: Diagnosis not present

## 2017-09-24 NOTE — Progress Notes (Signed)
Post-Op Visit Note   Patient: Becky [name]           Date of Birth: [birth year]           MRN: [id number] Visit Date: 09/24/2017 PCP: [name], MD  Chief Complaint:  Chief Complaint  Patient presents with  . Right Foot - Follow-up    HPI:  HPI the patient is a 62 year old woman status post right foot bunion and hammertoe repair on the right today.  She is complaining today of pain and a weird senstation in the second toe with regular shoewear.  She is in [employer].   Ortho Exam On examination incisions are well-healed.  There is no elevation of the second toe.  Toes are straight. there is no erythema no sign of infection.  Visit Diagnoses:  No diagnosis found.  Plan: reassurance provided. Discussed scar massage.  Patient to work on scar massage and downward pressure on the second toe.  Continue stiff shoe wear such as her [place] walking sneakers.  May wear the postop shoe if she prefers.  To follow-up in office in 3 weeks if continued pain.  Follow-Up Instructions: No follow-ups on file.   Imaging: No results found.  Orders:  No orders of the defined types were placed in this encounter.  No orders of the defined types were placed in this encounter.    PMFS History: Patient Active Problem List   Diagnosis Date Noted  . Claw toe, right 09/10/2017  . MCI (mild cognitive impairment) 03/13/2017  . Bunion of great toe of right foot 05/21/2016  . Knee pain, right 11/19/2014  . Right hip pain 11/19/2014  . Chronic cough 06/15/2014  . HTN (hypertension) 06/15/2014  . Osteopenia determined by [facility] 02/18/2014  . Lumbar foraminal stenosis 12/07/2013  . Benign paroxysmal positional vertigo 03/01/2013  . Nonsuppurative otitis media, not specified as acute or chronic 03/01/2013  . 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 Diabetes mellitus 07/01/2011  . Asthma, moderate persistent 01/15/2011  . Osteoarthrosis, unspecified whether generalized or  localized, involving lower leg 10/30/2009  . Backache 10/30/2009  . ORTHOSTATIC HYPOTENSION 05/17/2008  . EATING DISORDER, UNSPECIFIED 11/10/2007  . HYPERGLYCEMIA 02/23/2007  . GASTROPARESIS 04/25/2006   Past Medical History:  Diagnosis Date  . Anemia   . Anorexia nervosa   . Anxiety   . Arthritis    bilateral knees  . Asthma    mainly problems in cold weather  . Chronic pain syndrome   . Depression   . Diabetes mellitus   . Encounter for long-term (current) use of other medications   . Gastroparesis    botox injections  . GERD (gastroesophageal reflux disease)   . History of blood transfusion   . Hyperglycemia   . Hypertension   . MCI (mild cognitive impairment) 03/13/2017  . Migraine   . Migraine   . Multiple allergies   . Murmur, cardiac    seen cardiologist in past - no workup required  . Neuropathy    right foot  . Painful respiration   . Personality change due to conditions classified elsewhere   . Raynaud's syndrome   . Tremor   . Ventral hernia   . Vitamin D deficiency     Family History  Problem Relation Age of Onset  . Urinary tract infection Mother   . Sleep disorder Mother   . Dementia Mother   . Diabetes Father   . High blood pressure Father   . Cancer -  Prostate Father   . Dementia Father   . Emphysema Paternal Grandfather        was a smoker    Past Surgical History:  Procedure Laterality Date  . BOTOX INJECTION N/A 11/18/2012   Procedure: BOTOX INJECTION;  Surgeon: Missy Sabins, MD;  Location: [facility];  Service: Endoscopy;  Laterality: N/A;  . BOTOX INJECTION N/A 09/30/2013   Procedure: BOTOX INJECTION;  Surgeon: Missy Sabins, MD;  [facility]: [facility];  Service: Endoscopy;  Laterality: N/A;  . BOTOX INJECTION N/A 03/29/2015   Procedure: BOTOX INJECTION;  Surgeon: Teena Irani, MD;  Location: [place];  Service: Endoscopy;  Laterality: N/A;  . BREAST SURGERY    . CHOLECYSTECTOMY    . COLONOSCOPY    . COLONOSCOPY WITH PROPOFOL N/A  03/29/2015   Procedure: COLONOSCOPY WITH PROPOFOL;  Surgeon: Teena Irani, MD;  Location: [place];  Service: Endoscopy;  Laterality: N/A;  . ESOPHAGOGASTRODUODENOSCOPY  08/20/2011   Procedure: ESOPHAGOGASTRODUODENOSCOPY (EGD);  Surgeon: Missy Sabins, MD;  [facility];  Service: Endoscopy;  Laterality: N/A;  . ESOPHAGOGASTRODUODENOSCOPY N/A 11/18/2012   Procedure: ESOPHAGOGASTRODUODENOSCOPY (EGD);  Surgeon: Missy Sabins, MD;  Location: [facility];  Service: Endoscopy;  Laterality: N/A;  . ESOPHAGOGASTRODUODENOSCOPY N/A 09/30/2013   Procedure: ESOPHAGOGASTRODUODENOSCOPY (EGD);  Surgeon: Missy Sabins, MD;  Location: [facility];  Service: Endoscopy;  Laterality: N/A;  . ESOPHAGOGASTRODUODENOSCOPY (EGD) WITH PROPOFOL N/A 03/29/2015   Procedure: ESOPHAGOGASTRODUODENOSCOPY (EGD) WITH PROPOFOL;  Surgeon: Teena Irani, MD;  Location: [place];  Service: Endoscopy;  Laterality: N/A;  . HERNIA REPAIR    . LUMBAR LAMINECTOMY/DECOMPRESSION MICRODISCECTOMY Right 12/07/2013   Procedure: LUMBAR LAMINECTOMY/DECOMPRESSION MICRODISCECTOMY RIGHT  LUMBAR FIVE-SACRAL ONE ,FORAMINOTOMY;  Surgeon: Floyce Stakes, MD;  Location: [facility];  Service: Neurosurgery;  Laterality: Right;  . NISSEN FUNDOPLICATION  4536   Kaylyn Lim  . PEG TUBE PLACEMENT     removed 1996  . TONSILLECTOMY AND ADENOIDECTOMY     Social History   Occupational History  . Occupation: Disabled    Employer: UNEMPLOYED  Tobacco Use  . Smoking status: Former Smoker    Packs/day: 0.50    Years: 2.00    Pack years: 1.00    Types: Cigarettes    Last attempt to quit: 02/11/1993    Years since quitting: 24.6  . Smokeless tobacco: Never Used  Substance and Sexual Activity  . Alcohol use: No    Comment: quit ETOH in 1982  . Drug use: No  . Sexual activity: Not on file

## 2017-09-30 ENCOUNTER — Ambulatory Visit: Payer: Medicare Other | Admitting: Physical Therapy

## 2017-10-03 ENCOUNTER — Other Ambulatory Visit: Payer: Self-pay | Admitting: Family Medicine

## 2017-10-03 DIAGNOSIS — Z1231 Encounter for screening mammogram for malignant neoplasm of breast: Secondary | ICD-10-CM

## 2017-10-03 DIAGNOSIS — N644 Mastodynia: Secondary | ICD-10-CM

## 2017-10-06 ENCOUNTER — Ambulatory Visit: Payer: Medicare Other

## 2017-10-10 ENCOUNTER — Ambulatory Visit: Payer: Medicare Other

## 2017-10-10 ENCOUNTER — Ambulatory Visit
Admission: RE | Admit: 2017-10-10 | Discharge: 2017-10-10 | Disposition: A | Discharge status: Home / Self Care | Payer: Medicare Other | Source: Ambulatory Visit | Attending: Family Medicine | Admitting: Family Medicine

## 2017-10-10 DIAGNOSIS — N644 Mastodynia: Secondary | ICD-10-CM

## 2017-10-20 ENCOUNTER — Encounter: Payer: Self-pay | Admitting: Family Medicine

## 2017-10-20 ENCOUNTER — Ambulatory Visit (INDEPENDENT_AMBULATORY_CARE_PROVIDER_SITE_OTHER): Payer: Medicare Other | Admitting: Family Medicine

## 2017-10-20 DIAGNOSIS — F5001 Anorexia nervosa, restricting type: Secondary | ICD-10-CM | POA: Diagnosis not present

## 2017-10-20 NOTE — Patient Instructions (Addendum)
-   When you see your PT, be sure to ask what exercise you can do - besides the specific rehab exercises - that will NOT harm your foot.    - Your measures of iron adequacy are all low.  After your colonoscopy, start on 325 ferrous gluconate or ferrous sulfate ALONG WITH at least 200 mg vitamin C, preferably on an empty stomach.  Ask your GI doctor if she recommends a Rx iron supplement.    - As recommended by your therapist, supplements of Ca and vitamin D are also a good idea:  2000 IU vitamin D and ~500 mg Ca citrate.  Take calcium only 1 pill at a time for best absorption.  Try to NOT take your Ca supplement with a meal also high in Ca, e.g., milk or yogurt.  Best is to meet most of your calcium needs through food, using a supplement to make up the short-fall.    - A  MVM is probably a good idea in light of your minimal intake, which probably does not meet your nutritional needs.    - Grocery shop TODAY for your condo.  Be sure to get breakfast foods, frozen veg's, some protein foods, yogurt, soy milk, bread, fresh fruit, carrots, other veg's.    Food Goals: 1. 3 real meals a day, including breakfast.  Even if you are not hungry in the morning, have SOMEthing light.   2. Eat veg's at least once a day.   3. Record progress on http://www.richardson.info/, including end-of-week totals/averages for above goals.    - Include exercise report as well (how many minutes of what).

## 2017-10-20 NOTE — Progress Notes (Signed)
Medical Nutrition Therapy:  Appt start time: 1130 end time: 1200. PCP: Becky Eriksson, MD Therapist: Rea College, APRN, MSN, CS ([facility] [facility] Psychiatrist: [name], MD  Assessment:  Primary concerns today: eating disorder (F50.0, anorexia, restricting type).   Blind weight today: 139.2 lb, stable.     Becky [name] has been eating only 2 meals per day for the past week; usually missing breakfast.  She has no appetite in the morning.  Gets up between 6 and 8 AM; first meal is lunch between 1 and 2 PM.  She has had his father at the ER and doctor's office for bradycardia several times in the past 3 weeks.  This has been stressful, and has required Becky [name] to stay with her parents again.  Becky [name]'s mom now has in-home health care twice a week, which has been helpful.    09-19-17 Lab tests at Dr. Dois Davenport office showed Becky [name] to be low on all measures of Fe:  Hgb  10.6  Hct  33.2 MCV  78.6 MCH  25.0 RDW  19.3  A1C from same day was 5.8 (up from 5.7).  Fasting BG was 116.    Sleep: Minimal; estimates she gets ~5 hrs/night.   Chronic cough: Has improved a bit.        Physical activity:  None currently.  Will see PT tomorrow, and she will get an idea of what home or gym exercises she can do.   Progress:  In Progress.    Nutritional Diagnosis:  No progress on NB-1.5 Disordered eating pattern as related to weight (and other) anxiety as evidenced by only 2 meals/day on most days in recent weeks.      Intervention:  Nutrition counseling.     Monitoring/Evaluation:  Dietary intake, body weight, and physical activity in 4 weeks.

## 2017-10-21 ENCOUNTER — Encounter: Payer: Self-pay | Admitting: Physical Therapy

## 2017-10-21 ENCOUNTER — Other Ambulatory Visit: Payer: Self-pay

## 2017-10-21 ENCOUNTER — Ambulatory Visit: Payer: Medicare Other | Admitting: Family Medicine

## 2017-10-21 ENCOUNTER — Ambulatory Visit: Payer: Medicare Other | Attending: Orthopedic Surgery | Admitting: Physical Therapy

## 2017-10-21 DIAGNOSIS — M6281 Muscle weakness (generalized): Secondary | ICD-10-CM

## 2017-10-21 DIAGNOSIS — R262 Difficulty in walking, not elsewhere classified: Secondary | ICD-10-CM | POA: Diagnosis present

## 2017-10-21 DIAGNOSIS — M79671 Pain in right foot: Secondary | ICD-10-CM | POA: Diagnosis not present

## 2017-10-21 NOTE — Patient Instructions (Signed)
    [facility] [address] [place], [place] [address] Phone # [phone] Fax [phone]

## 2017-10-21 NOTE — Therapy (Signed)
[facility] [address], [place], [place], [address] Phone: [phone]   Fax:  [phone]  Physical Therapy Evaluation  Patient Details  Name: Becky [name] MRN: [id number] Date of Birth: 14-Mar-1955 Referring Provider: Dondra Prader Pa   Encounter Date: 10/21/2017  PT End of Session - 10/21/17 1908    Visit Number  1    Date for PT Re-Evaluation  12/16/17    Authorization Type  Medicare    PT Start Time  1497    PT Stop Time  0263    PT Time Calculation (min)  43 min    Activity Tolerance  Patient tolerated treatment well       Past Medical History:  Diagnosis Date  . Anemia   . Anorexia nervosa   . Anxiety   . Arthritis    bilateral knees  . Asthma    mainly problems in cold weather  . Chronic pain syndrome   . Depression   . Diabetes mellitus   . Encounter for long-term (current) use of other medications   . Gastroparesis    botox injections  . GERD (gastroesophageal reflux disease)   . History of blood transfusion   . Hyperglycemia   . Hypertension   . MCI (mild cognitive impairment) 03/13/2017  . Migraine   . Migraine   . Multiple allergies   . Murmur, cardiac    seen cardiologist in past - no workup required  . Neuropathy    right foot  . Painful respiration   . Personality change due to conditions classified elsewhere   . Raynaud's syndrome   . Tremor   . Ventral hernia   . Vitamin D deficiency     Past Surgical History:  Procedure Laterality Date  . BOTOX INJECTION N/A 11/18/2012   Procedure: BOTOX INJECTION;  Surgeon: Missy Sabins, MD;  Location: [facility];  Service: Endoscopy;  Laterality: N/A;  . BOTOX INJECTION N/A 09/30/2013   Procedure: BOTOX INJECTION;  Surgeon: Missy Sabins, MD;  [facility]: [facility];  Service: Endoscopy;  Laterality: N/A;  . BOTOX INJECTION N/A 03/29/2015   Procedure: BOTOX INJECTION;  Surgeon: Teena Irani, MD;  Location: [place];  Service:  Endoscopy;  Laterality: N/A;  . BREAST SURGERY    . CHOLECYSTECTOMY    . COLONOSCOPY    . COLONOSCOPY WITH PROPOFOL N/A 03/29/2015   Procedure: COLONOSCOPY WITH PROPOFOL;  Surgeon: Teena Irani, MD;  Location: [place];  Service: Endoscopy;  Laterality: N/A;  . ESOPHAGOGASTRODUODENOSCOPY  08/20/2011   Procedure: ESOPHAGOGASTRODUODENOSCOPY (EGD);  Surgeon: Missy Sabins, MD;  Location: [name];  Service: Endoscopy;  Laterality: N/A;  . ESOPHAGOGASTRODUODENOSCOPY N/A 11/18/2012   Procedure: ESOPHAGOGASTRODUODENOSCOPY (EGD);  Surgeon: Missy Sabins, MD;  Location: [facility];  Service: Endoscopy;  Laterality: N/A;  . ESOPHAGOGASTRODUODENOSCOPY N/A 09/30/2013   Procedure: ESOPHAGOGASTRODUODENOSCOPY (EGD);  Surgeon: Missy Sabins, MD;  Location: [facility];  Service: Endoscopy;  Laterality: N/A;  . ESOPHAGOGASTRODUODENOSCOPY (EGD) WITH PROPOFOL N/A 03/29/2015   Procedure: ESOPHAGOGASTRODUODENOSCOPY (EGD) WITH PROPOFOL;  Surgeon: Teena Irani, MD;  Location: [place];  Service: Endoscopy;  Laterality: N/A;  . HERNIA REPAIR    . LUMBAR LAMINECTOMY/DECOMPRESSION MICRODISCECTOMY Right 12/07/2013   Procedure: LUMBAR LAMINECTOMY/DECOMPRESSION MICRODISCECTOMY RIGHT  LUMBAR FIVE-SACRAL ONE ,FORAMINOTOMY;  Surgeon: Floyce Stakes, MD;  Location: [facility];  Service: Neurosurgery;  Laterality: Right;  . NISSEN FUNDOPLICATION  7858   Kaylyn Lim  . PEG TUBE PLACEMENT     removed  [place]      There were no vitals filed for this visit.   Subjective Assessment - 10/21/17 0935    Subjective  Had a bunion with toes drifting right.  Had bunionectomy in April 2018.  Had big toe pin and 2nd toe pin on 07/29/17.  Still swollen and painful.      Limitations  House hold activities;Walking    How long can you walk comfortably?  grocery store but larger store would start hurting    Diagnostic tests  x-rays before and after surgery "looked good" per PA    Patient Stated  Goals  see what you all recommend;  walk without feeling like something in between toes    Currently in Pain?  Yes    Pain Score  0-No pain   with walking 7/10   Pain Location  Foot    Pain Orientation  Right   plantar   Pain Type  Chronic pain    Pain Onset  More than a month ago    Pain Frequency  Intermittent    Aggravating Factors   walking extensively    Pain Relieving Factors  nothing         [facility] PT Assessment - 10/21/17 0001      Assessment   Medical Diagnosis  pain in right foot; bunion; claw toe    Referring Provider  Dondra Prader Pa    Onset Date/Surgical Date  --   June 2019   Next MD Visit  as needed    Prior Therapy  PT for back pain       Precautions   Precautions  None      Restrictions   Weight Bearing Restrictions  No      Balance Screen   Has the patient fallen in the past 6 months  No    Has the patient had a decrease in activity level because of a fear of falling?   Yes    Is the patient reluctant to leave their home because of a fear of falling?   No      Home Environment   Living Environment  Private residence    Type of [place] Access  Stairs to enter    Entrance Stairs-Number of Steps  18    Home Layout  Two level    Alternate Level Stairs-Number of Steps  12    Additional Comments  nothing recently; cane years ago with LBP      Prior Function   Level of Independence  Independent    Vocation  Retired    Leisure  that's a tough question; go out with friends for dinner      Observation/Other Assessments   Observations  right calf atrophy    Focus on Therapeutic Outcomes (FOTO)   48% limitation       Posture/Postural Control   Posture Comments  no visible swelling (AM appt); slight 2nd toe deviation toward great toe      AROM   Right Ankle Dorsiflexion  5    Right Ankle Plantar Flexion  40    Right Ankle Inversion  30    Right Ankle Eversion  20    Left Ankle Dorsiflexion  10    Left Ankle Plantar Flexion  50    Left  Ankle Inversion  30    Left Ankle Eversion  20      Strength   Strength Assessment  Site  --   decreased toe intrinsic strength right 3+/5   Right Ankle Dorsiflexion  4/5    Right Ankle Plantar Flexion  3+/5    Right Ankle Inversion  4/5    Right Ankle Eversion  4/5      Palpation   Palpation comment  decreased scar mobility       Ambulation/Gait   Assistive device  None    Pre-Gait Activities  decreased push off on right     Gait Comments  Unable to SLS on either leg 1-2 sec                Objective measurements completed on examination: See above findings.              PT Education - 10/21/17 1907    Education Details  Medbridge plantar fascia stretch;   toe towel scrunches;  heel cord stretch with towel, runners stretch at wall;  toe spreading    Person(s) Educated  Patient    Methods  Explanation;Demonstration;Handout    Comprehension  Returned demonstration;Verbalized understanding       PT Short Term Goals - 10/21/17 1928      PT SHORT TERM GOAL #1   Title  The patient will demonstrate knowledge of basic HEP and self care strategies    Time  4    Period  Weeks    Status  New    Target Date  11/18/17      PT SHORT TERM GOAL #2   Title  The patient will report a 30% improvement in pain with walking and during usual ADLs    Time  4    Period  Weeks    Status  New      PT SHORT TERM GOAL #3   Title  The patient will have improved gastroc muscle length with ankle dorsiflexion to > 8 degrees needed to ambulate on uneven terrain    Time  4    Period  Weeks    Status  New      PT SHORT TERM GOAL #4   Title  The patient will be able to walk 20-30 min with 4/10 pain level or less    Time  4    Period  Weeks    Status  New        PT Long Term Goals - 10/21/17 1932      PT LONG TERM GOAL #1   Title  The patient will be independent with safe self progression of HEP    Time  8    Period  Weeks    Status  New    Target Date  12/16/17       PT LONG TERM GOAL #2   Title  The patient will report a 60% improvement in foot pain with walking and usual ADLs    Time  8    Period  Weeks    Status  New      PT LONG TERM GOAL #3   Title  The patient will have improved toe intrinsic strength and ankle plantarflexion strength to 4-/5 needed walking longer distances    Time  8    Period  Weeks    Status  New      PT LONG TERM GOAL #4   Title  The patient will be able to walk 35-45 minutes needed for shopping in larger stores    Time  8    Period  Weeks  Status  New      PT LONG TERM GOAL #5   Title  FOTO functional outcome score improved from 48% limitation to 44% indicating improved function with less pain    Time  8    Period  Weeks    Status  New             Plan - 10/21/17 1909    Clinical Impression Statement  Patient had right bunionectomy in April 2018 and continued to have pain.  Underwent surgery to correct her toes from crossing and had pins and screw fixation of her 1st and 2nd toes on 07/29/17.  She wore a walking boot for 2 weeks.  She complains of continued pain on the plantar side of her foot at the crease of her toes.  She is unable to walk longer distances at a larger store.  No visible swelling at this early morning appt.  Right gastronemius and soleus muscle atrophy.   Ankle ROM WFLs although decreased gastroc muscle length.  Difficulty with spreading toes bilaterally.  Decreased ankle strength particularly plantarflexors.  Decreased intrinsic foot strength.  She is unable to single leg stand on either leg.  Decreased toe off during gait.  She would benefit from PT to address these deficits.      History and Personal Factors relevant to plan of care:  numerous co-morbidities but stable status    Clinical Presentation  Stable    Clinical Decision Making  Low    Rehab Potential  Good    Clinical Impairments Affecting Rehab Potential  osteopenia, DM, LBP, vertigo    PT Frequency  2x / week    PT Duration   8 weeks    PT Treatment/Interventions  ADLs/Self Care Home Management;Cryotherapy;Electrical Stimulation;Ultrasound;Moist Heat;Iontophoresis 4mg /ml Dexamethasone;Therapeutic exercise;Therapeutic activities;Neuromuscular re-education;Gait training;Patient/family education;Manual techniques;Taping    PT Next Visit Plan  review intrinsic exs;  add band to HEP;  try Nu-Step;  scar mob; manual therapy;  seated BAPS    PT Home Exercise Plan  Medbridge    Recommended Other Services  patient wants to return to her gym eventually    Consulted and Agree with Plan of Care  Patient       Patient will benefit from skilled therapeutic intervention in order to improve the following deficits and impairments:  Pain, Decreased activity tolerance, Decreased range of motion, Decreased strength, Impaired perceived functional ability, Difficulty walking, Increased edema  Visit Diagnosis: Pain in right foot - Plan: PT plan of care cert/re-cert  Difficulty in walking, not elsewhere classified - Plan: PT plan of care cert/re-cert  Muscle weakness (generalized) - Plan: PT plan of care cert/re-cert     Problem List Patient Active Problem List   Diagnosis Date Noted  . Claw toe, right 09/10/2017  . MCI (mild cognitive impairment) 03/13/2017  . Bunion of great toe of right foot 05/21/2016  . Knee pain, right 11/19/2014  . Right hip pain 11/19/2014  . Chronic cough 06/15/2014  . HTN (hypertension) 06/15/2014  . Osteopenia determined by [facility] 02/18/2014  . Lumbar foraminal stenosis 12/07/2013  . Benign paroxysmal positional vertigo 03/01/2013  . Nonsuppurative otitis media, not specified as acute or chronic 03/01/2013  . 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 Diabetes mellitus 07/01/2011  . Asthma, moderate persistent 01/15/2011  . Osteoarthrosis, unspecified whether generalized or localized, involving lower leg 10/30/2009  . Backache 10/30/2009  . ORTHOSTATIC HYPOTENSION 05/17/2008  . EATING  DISORDER, UNSPECIFIED 11/10/2007  . HYPERGLYCEMIA 02/23/2007  . GASTROPARESIS  04/25/2006   Ruben Im, PT 10/21/17 7:40 PM Phone: [phone] Fax: [phone]  Alvera Singh 10/21/2017, 7:39 PM  [facility] [address] [place], [place], [address] Phone: [phone]   Fax:  [phone]  Name: Becky [name] MRN: [id number] Date of Birth: 05/26/1955

## 2017-10-28 ENCOUNTER — Ambulatory Visit: Payer: Medicare Other | Admitting: Physical Therapy

## 2017-10-28 ENCOUNTER — Encounter: Payer: Self-pay | Admitting: Physical Therapy

## 2017-10-28 DIAGNOSIS — M6281 Muscle weakness (generalized): Secondary | ICD-10-CM

## 2017-10-28 DIAGNOSIS — M79671 Pain in right foot: Secondary | ICD-10-CM | POA: Diagnosis not present

## 2017-10-28 DIAGNOSIS — R262 Difficulty in walking, not elsewhere classified: Secondary | ICD-10-CM

## 2017-10-28 NOTE — Therapy (Addendum)
[facility] [address], [place], [place], [address] Phone: [phone]   Fax:  [phone]  Physical Therapy Treatment/Discharge Summary  Patient Details  Name: Becky [name] MRN: [id number] Date of Birth: 1955/05/17 Referring Provider: Dondra Prader Pa   Encounter Date: 10/28/2017  PT End of Session - 10/28/17 1252    Visit Number  2    Date for PT Re-Evaluation  12/16/17    Authorization Type  Medicare    PT Start Time  1147    PT Stop Time  1230    PT Time Calculation (min)  43 min    Activity Tolerance  Patient tolerated treatment well       Past Medical History:  Diagnosis Date  . Anemia   . Anorexia nervosa   . Anxiety   . Arthritis    bilateral knees  . Asthma    mainly problems in cold weather  . Chronic pain syndrome   . Depression   . Diabetes mellitus   . Encounter for long-term (current) use of other medications   . Gastroparesis    botox injections  . GERD (gastroesophageal reflux disease)   . History of blood transfusion   . Hyperglycemia   . Hypertension   . MCI (mild cognitive impairment) 03/13/2017  . Migraine   . Migraine   . Multiple allergies   . Murmur, cardiac    seen cardiologist in past - no workup required  . Neuropathy    right foot  . Painful respiration   . Personality change due to conditions classified elsewhere   . Raynaud's syndrome   . Tremor   . Ventral hernia   . Vitamin D deficiency     Past Surgical History:  Procedure Laterality Date  . BOTOX INJECTION N/A 11/18/2012   Procedure: BOTOX INJECTION;  Surgeon: Missy Sabins, MD;  Location: [facility];  Service: Endoscopy;  Laterality: N/A;  . BOTOX INJECTION N/A 09/30/2013   Procedure: BOTOX INJECTION;  Surgeon: Missy Sabins, MD;  Location: [facility];  Service: Endoscopy;  Laterality: N/A;  . BOTOX INJECTION N/A 03/29/2015   Procedure: BOTOX INJECTION;  Surgeon: Teena Irani, MD;  Location: [facility];  Service: Endoscopy;  Laterality: N/A;  . BREAST SURGERY    . CHOLECYSTECTOMY    . COLONOSCOPY    . COLONOSCOPY WITH PROPOFOL N/A 03/29/2015   Procedure: COLONOSCOPY WITH PROPOFOL;  Surgeon: Teena Irani, MD;  Location: [facility];  Service: Endoscopy;  Laterality: N/A;  . ESOPHAGOGASTRODUODENOSCOPY  08/20/2011   Procedure: ESOPHAGOGASTRODUODENOSCOPY (EGD);  Surgeon: Missy Sabins, MD;  Location: [facility];  Service: Endoscopy;  Laterality: N/A;  . ESOPHAGOGASTRODUODENOSCOPY N/A 11/18/2012   Procedure: ESOPHAGOGASTRODUODENOSCOPY (EGD);  Surgeon: Missy Sabins, MD;  Location: [facility];  Service: Endoscopy;  Laterality: N/A;  . ESOPHAGOGASTRODUODENOSCOPY N/A 09/30/2013   Procedure: ESOPHAGOGASTRODUODENOSCOPY (EGD);  Surgeon: Missy Sabins, MD;  Location: [facility];  Service: Endoscopy;  Laterality: N/A;  . ESOPHAGOGASTRODUODENOSCOPY (EGD) WITH PROPOFOL N/A 03/29/2015   Procedure: ESOPHAGOGASTRODUODENOSCOPY (EGD) WITH PROPOFOL;  Surgeon: Teena Irani, MD;  Location: [facility];  Service: Endoscopy;  Laterality: N/A;  . HERNIA REPAIR    . LUMBAR LAMINECTOMY/DECOMPRESSION MICRODISCECTOMY Right 12/07/2013   Procedure: LUMBAR LAMINECTOMY/DECOMPRESSION MICRODISCECTOMY RIGHT  LUMBAR FIVE-SACRAL ONE ,FORAMINOTOMY;  Surgeon: Floyce Stakes, MD;  Location: [facility];  Service: Neurosurgery;  Laterality: Right;  . NISSEN FUNDOPLICATION  7322   Kaylyn Lim  . PEG TUBE PLACEMENT  removed 1996  . TONSILLECTOMY AND ADENOIDECTOMY      There were no vitals filed for this visit.  Subjective Assessment - 10/28/17 1153    Subjective  Doing OK.  I didn't get to do the exercises b/c my dad had a (medical) issue.      Currently in Pain?  Yes    Pain Score  1                        [facility] Adult PT Treatment/Exercise - 10/28/17 0001      Manual Therapy   Manual therapy comments  passive heel cord stretch; passive arch stretch     Soft tissue mobilization  scar and between  metatarsals      Ankle Exercises: Aerobic   Nustep  L2 4 min seat 7      Ankle Exercises: Seated   Towel Crunch  5 reps    BAPS  Sitting;Level 2;10 reps    BAPS Limitations  clockwise and counterclockwise    Other Seated Ankle Exercises  toe spreading       Ankle Exercises: Stretches   Gastroc Stretch  3 reps;20 seconds    Gastroc Stretch Limitations  with towel       Ankle Exercises: Supine   T-Band  red: Dorsiflexion, plantarflexion, inversion, eversion 15x each                PT Short Term Goals - 10/21/17 1928      PT SHORT TERM GOAL #1   Title  The patient will demonstrate knowledge of basic HEP and self care strategies    Time  4    Period  Weeks    Status  New    Target Date  11/18/17      PT SHORT TERM GOAL #2   Title  The patient will report a 30% improvement in pain with walking and during usual ADLs    Time  4    Period  Weeks    Status  New      PT SHORT TERM GOAL #3   Title  The patient will have improved gastroc muscle length with ankle dorsiflexion to > 8 degrees needed to ambulate on uneven terrain    Time  4    Period  Weeks    Status  New      PT SHORT TERM GOAL #4   Title  The patient will be able to walk 20-30 min with 4/10 pain level or less    Time  4    Period  Weeks    Status  New        PT Long Term Goals - 10/21/17 1932      PT LONG TERM GOAL #1   Title  The patient will be independent with safe self progression of HEP    Time  8    Period  Weeks    Status  New    Target Date  12/16/17      PT LONG TERM GOAL #2   Title  The patient will report a 60% improvement in foot pain with walking and usual ADLs    Time  8    Period  Weeks    Status  New      PT LONG TERM GOAL #3   Title  The patient will have improved toe intrinsic strength and ankle plantarflexion strength to 4-/5 needed walking longer distances    Time  8    Period  Weeks    Status  New      PT LONG TERM GOAL #4   Title  The patient will be able to walk  35-45 minutes needed for shopping in larger stores    Time  8    Period  Weeks    Status  New      PT LONG TERM GOAL #5   Title  FOTO functional outcome score improved from 48% limitation to 44% indicating improved function with less pain    Time  8    Period  Weeks    Status  New            Plan - 10/28/17 1208    Clinical Impression Statement  The patient is able to initiate right foot ankle and foot strengthening with minimal increase in pain.  She has difficulty getting her foot flat with a press motion secondary to decreased gastroc length.  Intrinsic muscle weakness making it difficult to do towel curls and to spread toes.  Therapist closely monitoring response with all and modifying as needed for pain.      Rehab Potential  Good    Clinical Impairments Affecting Rehab Potential  osteopenia, DM, LBP, vertigo    PT Frequency  2x / week    PT Duration  8 weeks    PT Treatment/Interventions  ADLs/Self Care Home Management;Cryotherapy;Electrical Stimulation;Ultrasound;Moist Heat;Iontophoresis 34m/ml Dexamethasone;Therapeutic exercise;Therapeutic activities;Neuromuscular re-education;Gait training;Patient/family education;Manual techniques;Taping    PT Next Visit Plan  review intrinsic exs and band ex;  Nu-Step;  scar mob; manual therapy;  seated BDalton   Access Code: [email or web address]       Patient will benefit from skilled therapeutic intervention in order to improve the following deficits and impairments:  Pain, Decreased activity tolerance, Decreased range of motion, Decreased strength, Impaired perceived functional ability, Difficulty walking, Increased edema  Visit Diagnosis: Pain in right foot  Difficulty in walking, not elsewhere classified  Muscle weakness (generalized)  PHYSICAL THERAPY DISCHARGE SUMMARY  Visits from Start of Care: 2  Current functional level related to goals / functional outcomes: The patient called to cancel all of  her appts stating she is unable to come to PT at this time due to circumstances with her parents (who have health issues.)  She states she will get a new referral when she is able to start back PT.     Remaining deficits: As above  Education / Equipment: Initial HEP  Plan: Patient agrees to discharge.  Patient goals were not met. Patient is being discharged due to the patient's request.  ?????          Problem List Patient Active Problem List   Diagnosis Date Noted  . Claw toe, right 09/10/2017  . MCI (mild cognitive impairment) 03/13/2017  . Bunion of great toe of right foot 05/21/2016  . Knee pain, right 11/19/2014  . Right hip pain 11/19/2014  . Chronic cough 06/15/2014  . HTN (hypertension) 06/15/2014  . [facility]  . Lumbar foraminal stenosis 12/07/2013  . Benign paroxysmal positional vertigo 03/01/2013  . Nonsuppurative otitis media, not specified as acute or chronic 03/01/2013  . 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 Diabetes mellitus 07/01/2011  . Asthma, moderate persistent 01/15/2011  . Osteoarthrosis, unspecified whether generalized or localized, involving lower leg 10/30/2009  . Backache 10/30/2009  . ORTHOSTATIC HYPOTENSION 05/17/2008  . EATING DISORDER, UNSPECIFIED 11/10/2007  .  HYPERGLYCEMIA 02/23/2007  . GASTROPARESIS 04/25/2006   Ruben Im, PT 10/28/17 9:03 PM Phone: [phone] Fax: [phone]  Alvera Singh 10/28/2017, 9:02 PM  [facility] [address] [place], [place], [address] Phone: [phone]   Fax:  [phone]  Name: Becky [name] MRN: [id number] Date of Birth: 02-21-1955

## 2017-10-30 ENCOUNTER — Ambulatory Visit: Payer: Medicare Other | Admitting: Physical Therapy

## 2017-11-04 ENCOUNTER — Encounter: Payer: Medicare Other | Admitting: Physical Therapy

## 2017-11-07 ENCOUNTER — Ambulatory Visit: Payer: Medicare Other | Admitting: Physical Therapy

## 2017-11-11 ENCOUNTER — Encounter: Payer: Medicare Other | Admitting: Physical Therapy

## 2017-11-14 ENCOUNTER — Encounter

## 2017-11-18 ENCOUNTER — Ambulatory Visit (INDEPENDENT_AMBULATORY_CARE_PROVIDER_SITE_OTHER): Payer: Medicare Other | Admitting: Family Medicine

## 2017-11-18 DIAGNOSIS — F5001 Anorexia nervosa, restricting type: Secondary | ICD-10-CM

## 2017-11-18 NOTE — Progress Notes (Signed)
Medical Nutrition Therapy:  Appt start time: 1130 end time: 1200. 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   Blind weight today: 139.2 lb, stable.     Becky [name] has been eating only 2 meals/day most days for the past few weeks.  Wt continues to go down, 137.2 lb today.  Becky [name]'s parents are not interested in eating veg's, so Becky [name] had also not included them in her own diet.  Her father's health has required her staying with her parents again most of the time, and she rarely gets away from their house.  They are probably going to get him into Hospice care.    Sleep: Minimal; estimates she gets ~5 hrs/night, although she has nights of no sleep 2-3 times a week, usually related to worrying.   Chronic cough: Another contributor to poor sleep.  Cough gets worse at night.    Physical activity:  None currently.  Hopes to be able to get back to working out some days, but she is afraid to be away from her parents.    Progress:  In Progress.    Nutritional Diagnosis:  No progress on NB-1.5 Disordered eating pattern as related to weight (and other) anxiety as evidenced by only 2 meals/day on most days in recent weeks.      Intervention:  Nutrition counseling.     Monitoring/Evaluation:  Dietary intake, body weight, and physical activity in 4 weeks.

## 2017-11-18 NOTE — Patient Instructions (Addendum)
Goals: 1. Starting tomorrow: Re-start gym workouts 3 times a week.  (Listen to your body for how long, how intense, etc. to go).  2. Eat three meals and 1-2 snacks a day.  3. Get at least one serving of vegetables (1 cup) and one serving of fruit daily.     - Complete your revised Goals Sheet, including tallying weekly minutes of exercise in right margin.   - Report progress on http://www.richardson.info/.

## 2017-11-20 ENCOUNTER — Ambulatory Visit: Payer: Medicare Other | Admitting: Physical Therapy

## 2017-12-16 ENCOUNTER — Ambulatory Visit (INDEPENDENT_AMBULATORY_CARE_PROVIDER_SITE_OTHER): Payer: Medicare Other | Admitting: Family Medicine

## 2017-12-16 DIAGNOSIS — F5001 Anorexia nervosa, restricting type: Secondary | ICD-10-CM | POA: Diagnosis not present

## 2017-12-16 NOTE — Patient Instructions (Signed)
-   Consider:  If you are dealing with a vicious cycle, how do you break it?  You just have to change one thing at a time.  For example, sitting around the house keeps you depressed.  Getting depressed keeps you sitting around the house.  What to change?  Get up and out.  Take [name] for a walk as often as you can.  (She will love it!)  Goals:  1. Walk [name] at least 30 minutes 3 times a week.   2. Eat at least 3 meals and one snack each day.   3. Get at least one serving daily of a fruit and at least one vegetable/day.    Other Goals: - Nail down a schedule of parent care with your sister [name] no later than Thursday, November 7.   - Move home by Thursday evening.  Complete grocery shopping for at least breakfast items to keep at home no later than the weekend.   - Send an [email or web address] message when you have done both of the above.

## 2017-12-16 NOTE — Progress Notes (Signed)
Medical Nutrition Therapy:  Appt start time: 1330 end time: 1430. 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   Blind weight today: 142 lb, up 3 lb in 1 month.     Becky [name] has been eating 1-2 meals/day, and occasionally 3/day in the past month.  She is still staying with her parents', and prepares meals for them.  The biggest barrier to eating for [name] now is that she just doesn't want to eat, especially on days when she didn't sleep the night before.  Level of depression has been quite bad, and as discussed today, lack of food and physical activity (and getting outside) may all be contributing to depression.  Also probably contributing is sleep deprivation.    Sleep: Still poor; estimates she gets ~5 hrs/night, although she has nights of no sleep 2-3 times/week, usually related to worrying.   Chronic cough: Did not inquire at today's visit.    Physical activity:  None currently.  Hopes to get to the gym or at least to walk in the neighborhood have not worked out.  [name] feels she can make a stronger effort at this, recognizing that sitting at home all day in her parents' home is probably contributing to her depression.    Did not complete food recall at today's visit.    Nutritional Diagnosis:  No progress on NB-1.5 Disordered eating pattern as related to weight (and other) anxiety as evidenced by 1-2 meals/day on most days in recent weeks.      Intervention:  Nutrition counseling.    Monitoring/Evaluation:  Dietary intake, body weight, and physical activity in 8 weeks.

## 2018-02-17 ENCOUNTER — Encounter: Payer: Self-pay | Admitting: Family Medicine

## 2018-02-17 ENCOUNTER — Ambulatory Visit (INDEPENDENT_AMBULATORY_CARE_PROVIDER_SITE_OTHER): Payer: Medicare Other | Admitting: Family Medicine

## 2018-02-17 DIAGNOSIS — F5001 Anorexia nervosa, restricting type: Secondary | ICD-10-CM

## 2018-02-17 NOTE — Progress Notes (Signed)
Medical Nutrition Therapy:  Appt start time: 1330 end time: 1430. 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   Blind weight today: 141.2 lb, stable.   Becky [name] has been struggling over the holidays.  She has worked out a schedule of parent care with her sister Becky [name], so has been going to her parents' every day, but is going home each night.  She has not gone to the gym, but joined a fitness center yesterday.  She also hopes to also work with a trainer once a week for the first month.    For the past couple months, [name] has been eating only 1-2 meals/day.  As usual, she reports never experiencing hunger, but she is also motivated to restrict b/c weight loss continues to be a goal.  Despite her weight being down 1 lb since 2 months ago, she felt certain she gained weight over the holidays.    Sleep: Still poor; estimates she gets ~2 hrs/night, with no sleep 3-4 times/week.  [name] had hoped to discuss this with Dr. Albertine Patricia, but missed her appt b/c she did fall asleep during the day that day, and slept through her appt time.    Vertigo/Dizziness:  Has experienced daily recently; suspects may be related to low blood sugar.   Chronic cough:  Was better for a while, but is back again; sometimes precludes sleep.    Physical activity:  None currently.    24-hr recall:  (Up at .Marland KitchenMarland KitchenNever went to sleep; was in bed from 8:30 PM to 8 AM).  B (8 AM)-  Water  Snk ( AM)-  Water  L ( PM)-  Water Snk ( PM)-  Water D (5:45 PM)-  1 c Tater Tots, 1 mozzarella cheese sandwich, water Snk ( PM)-  water Typical day? No.  Usually has more protein, from beans, nuts, and yogurt, and sometimes eats 2 meals.    Nutritional Diagnosis:  No progress on NB-1.5 Disordered eating pattern as related to weight (and other) anxiety  as evidenced by 1-2 meals/day on most days in recent weeks.      Intervention:  Nutrition counseling.    Monitoring/Evaluation:  Dietary intake, body weight, and physical activity in 4 weeks.

## 2018-02-17 NOTE — Patient Instructions (Addendum)
-   Schedule an appt to see Dr. Valentina Lucks to discuss med's, and particularly your sleep problems.    - Your inadequate eating (only 1-2 meals a day) is likely contributing to both your dizziness and insomnia.    - Start taking at least 200 mg vitamin C along with your iron supplement.  Best to take on an empty stomach.    Goals: 1. Exercise at least 3 X wk.  This may include any workout at the gym (>30 min) or walking [name] for 20-30 min.       Document minutes of exercise on http://www.richardson.info/.  2. Eat at least REAL 3 meals and one snack each day.       A real meal includes protein, starch, and veg's and/or fruit.   3. Get at least one serving daily of a fruit and at least one vegetable/day.    (Also report how you're doing on Goals 2 & 3 on http://www.richardson.info/.)  Keep in mind that the more frequently you can work out (at a mild or moderate intensity for now), the more quickly you will see results.  This doesn't mean exercising 7 days a week necessarily, but more than 3 times a week will probably be good, even if it is just for 10-15 minutes.

## 2018-03-17 ENCOUNTER — Encounter: Payer: Self-pay | Admitting: Family Medicine

## 2018-03-17 ENCOUNTER — Ambulatory Visit (INDEPENDENT_AMBULATORY_CARE_PROVIDER_SITE_OTHER): Payer: Medicare Other | Admitting: Family Medicine

## 2018-03-17 DIAGNOSIS — F5001 Anorexia nervosa, restricting type: Secondary | ICD-10-CM | POA: Diagnosis not present

## 2018-03-17 NOTE — Progress Notes (Signed)
Medical Nutrition Therapy:  Appt start time: 1330 end time: 1430. 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   Blind weight today: 137.4, down 4 lb in 1 month.    Ahnesty went camping with her sister and some friends recently, and had a great time.  This was the first social activity she has done in months.  Each of the two nights, she slept right through the night, her first good sleep in possibly more than a year.  She is currently at her parents' from 74 or 20 AM until 7-7:30 PM every day.  Esma and her therapist agreed to her limiting time there to 11 AM to 6 PM, but she has not been able to adhere to this.    Sleep: Still poor; still estimates she gets ~2 hrs/night, with no sleep 3-4 times/week.    Chronic cough:  No improvement; sometimes precludes sleep.    Vertigo/Dizziness:  Has associated past problems with low blood glucose, so has made an effort to snack more often, which seems to have helped     Eating pattern: 2 meals and 1-2 snacks per day.   Physical activity:  Has gone to 30-min exercise class 1 X wk  for the past four weeks.  She has been reluctant to go to the gym on her own, however, which she has explored with her therapist somewhat.    24-hr recall:  (Up at ---; never slept) B (10:30 AM)-  1 c mixed fresh fruit, water Snk ( AM)-   L (2 PM)-  1 vegetarian hotdog on bun, mustard, water Snk (4 PM)-  1 c mixed fresh fruit D (6:30 PM)-  1 cheese sandwich, 1 c tater tots, water Snk ( PM)-   Typical day? Yes.  Except she doesn't normally eat tater tots.   Nutritional Diagnosis:  No progress on NB-1.5 Disordered eating pattern as related to weight (and other) anxiety as evidenced by never getting 3 meals per day, demonstrated by continued weight loss (4 lb/month).      Intervention:   Nutrition counseling.    Monitoring/Evaluation:  Dietary intake, body weight, and physical activity in 4 weeks.

## 2018-03-17 NOTE — Patient Instructions (Addendum)
-   Obstacles that keep you from getting to the gym, including memories that might be triggered by running into someone there:  One thing that will help you manage feelings about bad memories is that you know those memories are in the past, and NOT a part of your current life.    - Goals:  1. Exercise at least 3 X wk.  This may include any workout at the gym (>30 min) or walking [name] for 20-30 min.       Document minutes of exercise on http://www.richardson.info/.  2. Eat at least REAL 3 meals and one snack each day. A real meal includes protein, starch, and veg's and/or fruit.   3. Get at least one serving daily of a fruit and at least one vegetable/day.   - Report your progress on each goal in a weekly report on http://www.richardson.info/.

## 2018-03-27 ENCOUNTER — Ambulatory Visit: Payer: Medicare Other | Admitting: Pharmacist

## 2018-04-03 ENCOUNTER — Ambulatory Visit (INDEPENDENT_AMBULATORY_CARE_PROVIDER_SITE_OTHER): Payer: Medicare Other | Admitting: Family Medicine

## 2018-04-03 VITALS — BP 92/70

## 2018-04-03 DIAGNOSIS — G8929 Other chronic pain: Secondary | ICD-10-CM

## 2018-04-03 DIAGNOSIS — M1712 Unilateral primary osteoarthritis, left knee: Secondary | ICD-10-CM | POA: Diagnosis not present

## 2018-04-03 DIAGNOSIS — M25562 Pain in left knee: Secondary | ICD-10-CM

## 2018-04-03 MED ORDER — METHYLPREDNISOLONE ACETATE 40 MG/ML IJ SUSP
40.0000 mg | Freq: Once | INTRAMUSCULAR | Status: AC
  Administered 2018-04-03: 40 mg via INTRA_ARTICULAR

## 2018-04-03 NOTE — Progress Notes (Signed)
   HPI  CC: Left knee pain  Becky [name] is a 63-year-old female presents for left knee pain.  She states the knee pain has been present for around 2 to 3 weeks.  She has a known history of medial joint arthritis in her left knee.  She was last seen in 2016, when she had a steroid injection.  She states since that time she believes she maybe had 1 or 2 steroid injections at other Ortho offices.  She states she usually gets several months of relief from these injections.  She is taken ibuprofen 800 mg daily for the last 2 to 3 weeks, which is helped somewhat.  She states the pain is worse when she stands from a sit.  Is also made worse when she does any prolonged walking.  She denies any numbness and tingling down her leg.  She denies any weakness of the leg.  She denies any locking catching of the leg.  She has no prior injury to the area.  She does not recall any [place] inciting event.  Past Injuries: None Past Surgeries: EGD, lumbar laminectomy in 2015. Smoking: Former smoker with 1 pack year history.  Quit in January 1995 Family Hx: Noncontributory  ROS: Per HPI; in addition no fever, no rash, no additional weakness, no additional numbness, no additional paresthesias, and no additional falls/injury.   All past medical history, medications, and allergies reviewed myself today's visit.  Objective: BP 92/70  Gen: Right-Hand Dominant. NAD, well groomed, a/o x3, normal affect.  CV: Well-perfused. Warm.  Resp: Non-labored.  Neuro: Sensation intact throughout. No gross coordination deficits.  Gait: Nonpathologic posture, unremarkable stride without signs of limp or balance issues.  Left knee exam: No erythema, warmth, swelling noted.  Tenderness palpation of the medial joint line.  Positive J sign.  Full range of motion knee extension knee flexion.  Strength out of 5 throughout all testing.  Negative Lockman, negative posterior drawer, negative valgus stress test, negative varus stress test, negative  McMurray test.  Assessment and Plan: Left knee pain, with mild medial joint line osteoarthritis seen on x-rays in 2016.  INJECTION: Patient was given informed consent, signed copy in the chart. Appropriate time out was taken. Area prepped and draped in usual sterile fashion.  2 cc of methylprednisolone 40 mg/ml plus 4 cc of 1% lidocaine without epinephrine was injected into the left  knee using a(n) lateral approach. The patient tolerated the procedure well. There were no complications. Post procedure instructions were given.  We discussed treatment options at today's visit.  I gave her a corticosteroid injection as noted above.  She tolerated procedure well.  We will obtain standing x-rays today to reevaluate the knee, since is been 4 years since her last x-rays.  This will be to check if there is any progression of her OA.  I will also provide her with some exercises today for VMO strengthening as well as hip abductor strengthening.  This is to help with the tracking issues she is having a physical exam.  We will see her back as needed in clinic.  Lewanda Rife, MD [facility] 04/03/2018 11:42 AM

## 2018-04-06 ENCOUNTER — Ambulatory Visit: Payer: Medicare Other | Admitting: Family Medicine

## 2018-04-07 ENCOUNTER — Ambulatory Visit (INDEPENDENT_AMBULATORY_CARE_PROVIDER_SITE_OTHER): Payer: Medicare Other | Admitting: Family Medicine

## 2018-04-07 DIAGNOSIS — F5001 Anorexia nervosa, restricting type: Secondary | ICD-10-CM

## 2018-04-07 NOTE — Progress Notes (Signed)
Medical Nutrition Therapy:  Appt start time: 1000 end time: 1200. 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   Blind weight today: 137.2, stable.    Becky [name] has been sick for the past couple of weeks.  Appetite has been even worse than usual, and she has been unable to exercise.  She just finished a round of antibiotics prescribed for strep.  She has been staying at home overnight, but still usually going to her parents' during the day.  Although she is usually up ~5 AM, and doesn't go to her parents' till 9 or 10, she doesn't keep much food on hand, so has not been eating breakfast.  Becky [name] is usually eating a fruit serving daily, but often not getting any veg's.   Sleep: Estimates getting >5-6 hrs/night, usually going to bed ~7 PM; wakes around 12 AM, and has been able to fall back to sleep after 1-2 hrs.      Chronic cough:  No improvement; still sometimes precludes sleep.    Vertigo/Dizziness:  Has had a lot recently.  Still suspects this is blood glucose-related.     Eating pattern: 2 meals and 1 snack per day.   Physical activity:  Has not exercised much d/t being sick; feeling better this week, and went to the gym yesterday to complete a 30-min workout.  Becky [name] is taking the second of a 4-week workout class that meets 3 X wk.    24-hr recall suggests intake of ~1200 kcal:  (Up at 5 AM) B (5 AM)-  water Snk (10 AM)-  Diet Gatorade                 (~30 kcal) L (2 PM)-  1 soy hotdog on bun, mustard, water    360 Snk (3 PM)-  1 c mixed fruit       120 D (5:30 PM)-  1 Swiss cheese (2 slc) sandw, 1/2 c peanuts, water 720 Snk ( PM)-  water Typical day? Yes.    Nutritional Diagnosis:  No progress on NB-1.5 Disordered eating pattern as related to weight (and other) anxiety as evidenced by eating  pattern of 2 meals/day.     Intervention:  Nutrition counseling.    Monitoring/Evaluation:  Dietary intake, body weight, and physical activity in 4 weeks.

## 2018-04-07 NOTE — Patient Instructions (Addendum)
-   You have a lot of time to get breakfast at home before going over to your parents'.    - Grocery shop to get breakfast foods: Oatmeal, cereal, milk, peanut butter, bread, yogurt, eggs. - If you want to get back to consistent exercise, and if you want to start feeling energetic, YOU HAVE TO EAT!  - To feel good, and to be able to fuel your exercise, you need to eat with regularity, and you need to get nutritionally balanced meals that include at least some carbohydrate.  Carb in your body is found in blood (blood glucose) and in its storage form, glycogen, which is found in the liver and in muscle.  When you don't maintain adequate blood glucose levels, you start to draw on glycogen, which can deplete your stores of carbohydrate.  Muscles that are depleted of glycogen are weak.    - Chronic mal- / under-nutrition also contributes to depression and isolating behavior.  Your brain wants a constant supply of glucose.    Goals remain the same:  1. Exercise at least 3 X wk. This may include a workout at the gym (>30 min) or walking [name] 20-30 min.   2. Eat at least 3 REAL meals and one snack each day.  A REAL meal includes a source of protein, starch, and veg's and/or fruit.   3. Get at least one serving daily of a fruit and at least one vegetable/day.  (Veg's twice a day would be even better!)

## 2018-04-07 NOTE — Progress Notes (Signed)
[facility]: Attending Note: I have reviewed the chart, discussed wit the Sports Medicine Fellow. I agree with assessment and treatment plan as detailed in the Fellow's note.  

## 2018-05-05 ENCOUNTER — Ambulatory Visit: Payer: Medicare Other | Admitting: Family Medicine

## 2018-06-02 ENCOUNTER — Ambulatory Visit: Payer: Medicare Other | Admitting: Family Medicine

## 2018-06-02 ENCOUNTER — Encounter: Payer: Self-pay | Admitting: Family Medicine

## 2018-06-02 NOTE — Progress Notes (Unsigned)
I connected with  Becky [name] on 06/02/2018 by video-enabled telemedicine application [email or web address], and verified that I am speaking with the correct person using two identifiers. I discussed the limitations of evaluation and management by telemedicine. The patient expressed understanding and agreed to proceed.  Provider was [name], PhD, RD, LDN, CEDRD Provider(s) located at home during this phone encounter; patient was at home  Session Time:  1200-1230  Reason for telephone visit: Follow-up of Medical Nutrition Therapy for follow-up of Medical Nutrition Therapy for anorexia nervosa, restricting type (F50.01).  Relevant history/background: [name] has been working toward goals to increase frequency of eating and to better meet nutritional needs.    Assessment:  From 2/25 MNT appt:  1. Exercise at least 3 X wk: Has walked some 1-2 X wk.  Still having foot problems since surgery June 2019.  2. Eat at least 3 REAL meals and one snack each day: Getting 2-3 meals a day. 3. Get ?1 fruit and ?1 veg/day: Getting at least one fruit and veg daily. [name] is not documenting progress on goals.   [name] is concerned that her memory is deteriorating.    24-hr recall suggests intake of *** kcal:  (Up at.Marland KitchenMarland KitchenMarland KitchenDid not sleep all night) Went to parents' house ~9:30 AM.  B (11 AM)-  2 pkts sweetened instant oatmeal, diet Gatorade Snk ( AM)-  water L (2 PM)-  1 tofu hotdog w/ bun & mustard, diet Gatorade Snk ( PM)-  water D (6 PM)-  1 c mashed potatoes, 1/2 c roasted carrots, water Snk ( PM)-  water Typical day? Yes.  except dinner usually has a protein component.    Intervention: We reviewed progress on goals, and discussed ways in which she could improve.  Also dicussed the possibility that her short-memory problems are in part due to both sleep deprivation and malnutrition.    Goals:  1. Exercise at least 3 X wk.  2. Eat at least 3 REAL meals and one snack each day.  3. Get ?1 fruit and  ?2 veg's/day.  Document progress on goals daily.   Recommended seeing sleep medicine physician Carlena Sax.   Follow-up: telehealth visit on May 18 at [facility]

## 2018-06-02 NOTE — Progress Notes (Signed)
I connected with Becky [name] (MRN [id number]) on 06/02/2018 by video-enabled telemedicine application [email or web address], and verified that I am speaking with the correct person using two identifiers. I discussed the limitations of evaluation and management by telemedicine. The patient expressed understanding and agreed to proceed.  Provider was [name], PhD, RD, LDN, CEDRD Provider(s) located at home during this phone encounter; patient was at home  Session Time:  1200-1230  Reason for telephone visit: Follow-up of Medical Nutrition Therapy for anorexia nervosa, restricting type (F50.01)  Relevant history/background: Becky [name] insists that her restrictive eating is mainly b/c she has no appetite; she often does not think about eating.  She has repeatedly expressed extreme anxiety about her weight.  In addition, Becky [name] has struggled with insomnia for at least a year now, finding little or no relief with medication adjustments.    Assessment:  Becky [name] is concerned that her short-term memory has further declined.  This is difficult to evaluate, however, in light of her sleep deprivation and malnutrition.  She did not document progress on goals.  She has been staying at her own home at night, but is still at her parents' for most of each day.    Goals from 2/25    Goals achieved (estimated) 1. Exercise at least 3 X wk:  Walking 1-2 X wk 2. ? 3 REAL meals & 1 snack/day:  Getting 2 meals most days 3. ?1 fruit and ?1 veg/day:    Meeting goal most days  24-hr recall:  (Up at  .Marland Kitchen Never slept all night) B (9 AM)-  2 pkts flavored inst oatmeal, diet Gatorade Snk ( AM)-  water L (3 PM)-  1 tofu hotdog with bun and mustard, diet Gatorade Snk ( PM)-  water D (6 PM)-  1 c mashed potatoes,  c roasted carrots water Snk ( PM)-  water Typical day? Yes, except usually has some protein for dinner, and more for lunch.   Intervention: Reviewed Becky [name]'s month, and discussed her worries about decline in  short-term memory.  I pointed out to her that poor memory is a symptom of both sleep deprivation and malnutrition, neither of which was assessed when she saw neurologist Margette Fast a few months ago.    Goals:  1. Exercise at least 3 X wk.  2. Eat at least 3 REAL meals and one snack each day.  A REAL meal includes a source of protein, starch, and veg's and/or fruit.   3. Get at least one fruit serving and at least two vegetable servings/day.  Follow-up: Telehealth visit in May.   Becky [name]

## 2018-06-29 ENCOUNTER — Other Ambulatory Visit: Payer: Self-pay

## 2018-06-29 ENCOUNTER — Ambulatory Visit (INDEPENDENT_AMBULATORY_CARE_PROVIDER_SITE_OTHER): Payer: Medicare Other | Admitting: Family Medicine

## 2018-06-29 DIAGNOSIS — F5001 Anorexia nervosa, restricting type: Secondary | ICD-10-CM

## 2018-06-29 NOTE — Progress Notes (Signed)
I connected with Becky [name] (MRN [id number]) on 06/29/2018 by MyChart video-enabled telemedicine application, verified that I am speaking with the correct person using two identifiers, and that the patient was in a private environment conducive to confidentiality.  I discussed the limitations of evaluation and management by telemedicine. The patient expressed understanding and agreed to proceed.  Provider was [name], PhD, RD, LDN, CEDRD Provider(s) located at home during this phone encounter; patient was at home  Session Time:  1130-1230  Reason for telephone visit: Follow-up of Medical Nutrition Therapy for anorexia nervosa, restricting type (F50.01)  Relevant history/background: [name] continues to have insomnia and extreme anxiety about her weight. Only eating 2 X day on most days.  She has been sleeping on the couch for the past 5 yrs, until 2 weeks ago.  She has been going to her parents' house every day, but is going home overnight (having home health aides for them 2 hrs 3 X week).    Assessment:  [name] has not documented progress on goals.  Sleep: Goes to bed usually ~10 PM, falls asleep ~half the time, but usually wakes at 3, 4, or 5 AM, usually not able to go back to sleep.  Estimates she gets ~5 hrs sleep/night, range 0-8 hrs/night, consistently getting 0 hrs sleep 2 X wk, but feels sleep quality is better since starting to sleep in her bed rather than recliner.   Goals from 2/25    Goals achieved (estimated) 1. Exercise at least 3 X wk:  No consistent exercise 2. ? 3 REAL meals & 1 snack/day:  Getting 2 meals most days 3. ?1 fruit and ?2 veg/day:    Meeting goal on most days  24-hr recall:  (Up at 4 AM; had water right away) B (10:30 AM)-  3/4 c raisin bran, 3/4 c 1% milk Snk ( AM)-   L ( PM)-  ?? Snk ( PM)-   D ( PM)-  2 scrmbled eggs, 1-2 oz cheese, diet Gatorade Snk ( PM)-   Typical day? Yes.  Usually eating 2 X day only.    Intervention: Reviewed  Tiarah's month, and discussed importance of determining what she can control in her life, as well as documenting progress on goals.   Recommendations:  See Patient Instructions.    [name] SYKES

## 2018-06-29 NOTE — Patient Instructions (Signed)
Recommendations:  - Talk to [facility] tomorrow about what you can control in your life right now (with respect to your responsibility to your parents).  A goal for this appt with her is to come up with a specific plan for how to better manage your responsibility.  - Consider getting [employer] soy milk by the case to be able to stock up.   - TODAY: Go grocery shopping for foods to keep at your house.    1. Walk at least 10 minutes 4 X wk, to take place before you go to your parents' in the morning. (Or - 10 min stationary bike or yoga video if raining.) 2. Eat at least 3 REAL meals and one snack each day, including breakfast at home.   3. Get at least one fruit serving and at least two vegetable servings/day.   - Document progress on Goals Sheet (revised) mailed today.    Documenting your progress can help you find and maintain motivation.  We manage best what we monitor.   It is also harder to both document and follow through with your intentions when you feel overwhelmed.  This means you'll probably have most success following through with your exercise and breakfast goals BEFORE you go to your parents'.    Follow-up: Telehealth visit June 22 at 11 AM.

## 2018-08-03 ENCOUNTER — Other Ambulatory Visit: Payer: Self-pay

## 2018-08-03 ENCOUNTER — Ambulatory Visit (INDEPENDENT_AMBULATORY_CARE_PROVIDER_SITE_OTHER): Payer: Medicare Other | Admitting: Family Medicine

## 2018-08-03 DIAGNOSIS — F5001 Anorexia nervosa, restricting type: Secondary | ICD-10-CM | POA: Diagnosis not present

## 2018-08-03 NOTE — Patient Instructions (Addendum)
-   Resolve to take at least some of your time back.  - Limit time at your parents' to the following times:   Sun, Mon, Wed, Sat: 11 AM to 5 PM   Tue, Thur, Fri:  1:30 to 5 PM - Put dinner on the table at 5 PM, and go home.   - Make sure you eat at your house before you go to your parents' each day.    Goals: 1. Exercise at least 10 minutes 3 X wk.       Make sure this happens BEFORE you go to your parents.       When possible, get outside for exercise.   2. Eat at least 3 real meals/day (includes protein, starch, and veg's).  3. Get at least 1 fruit and 2 veg servings/day.  - DOCUMENT on your goals sheet, which I will mail to you.    - Consider getting from [employer]: case of 12 quarts of soymilk, yogurt, and other products(?) that allow you to keep foods well stocked at home.

## 2018-08-03 NOTE — Progress Notes (Signed)
I connected with Kylynn Street (MRN [id number]) on 08/03/2018 by [email or web address] video-enabled telemedicine application, verified that I am speaking with the correct person using two identifiers, and that the patient was in a private environment conducive to confidentiality.  I discussed the limitations of evaluation and management by telemedicine. The patient expressed understanding and agreed to proceed.  Provider was [name], PhD, RD, LDN, CEDRD Provider(s) located at home during this phone encounter; patient was at home  Session Time:  1100-1130 (30 minutes)  Reason for telephone visit: Follow-up of Medical Nutrition Therapy for anorexia nervosa, restricting type (F50.01)  Relevant history/background: Eulene continues to have insomnia and extreme anxiety about her weight. Goals include getting at least 3 meals a day and to exercise at least 3 X wk.    Assessment:  Tyquisha knows she has lost weight b/c she has dropped about two pants sizes.  She has not weighed herself.  She struggles with not feeling good - fatigue, light-headedness - but also likes to know she is losing weight.   Pricsilla is going to her parents' from 72 AM to 6 or 7 PM daily, getting virtually no time to herself.  Her depression continues to be bad, according to Enna b/c she feels she no longer has a life.  Her life revolves entirely around her parents, and has for a few years now.   Sleep: Estimates she gets ~4-5 hrs sleep/night, range 0-5 hrs/night.  Consistently sleeping in her bed rather than on sofa.    Goals from 5/18    Goals achieved (estimated) 1. Exercise at least 4 X wk:  Not happening 2. ? 3 REAL meals & 1 snack/day:  Not getting any real meals 3. ?1 fruit and ?2 veg/day:    2-3 X wk  24-hr recall:  (Up at 5 AM) Went to parents' at 43 AM; drank nothing but water and diet Gatorade B ( AM)-  --- Snk ( AM)-  --- L ( PM)-  --- Snk ( PM)-  --- D ( PM)-  --- Snk ( PM)-  --- Typical day? No. Did not  feel well yesterday. More typical is eating 3 times a day, but "meals" are more like snacks.  Usually getting breakfast of 1 c Raisin Bran with 1/2 c half&half (only thing available at her parents' house) & fruit; lunch of soy hotdog on bun or grilled chs sandw; and dinner of scrmbled egg with chs & toast or beans and rice.  Estimates she is getting veg's 2-3 X wk.    Intervention: Reviewed Sarahjane's progress on goals over the past month, and discussed obstacles.  Lerae felt she can make some progress on the same goals, and acknowledged that limiting the hours she spends at her parents' may help.    Recommendations:  See Patient Instructions.    [name] Greysen Devino

## 2018-08-31 ENCOUNTER — Ambulatory Visit (INDEPENDENT_AMBULATORY_CARE_PROVIDER_SITE_OTHER): Payer: Medicare Other | Admitting: Family Medicine

## 2018-08-31 ENCOUNTER — Other Ambulatory Visit: Payer: Self-pay

## 2018-08-31 DIAGNOSIS — F5001 Anorexia nervosa, restricting type: Secondary | ICD-10-CM | POA: Diagnosis not present

## 2018-08-31 NOTE — Patient Instructions (Addendum)
Goals: 1. Exercise at least 10 minutes 3 X wk.      Walk before you go to your parents' each day.  2. Eat at least 3 real meals/day.      Make sure you buy foods YOU can eat when you shop for your parents.       Drop your foods off at home before taking groceries to your parents'.     Eat breakfast before you go to your parents'.  3. Get at least 1 fruit and 2 veg svngs/day.       Make use of frozen vegetables for a quick, easy back-up option.  (Keep the freezer well stocked!)  Follow-up appt via MyChart on Monday, Aug 17 at 11 AM.

## 2018-08-31 NOTE — Progress Notes (Signed)
I connected with Annajulia Lewing (MRN [id number]) on 08/31/2018 by telephone (patient could not access MyChart again), verified that I am speaking with the correct person using two identifiers, and that the patient was in a private environment conducive to confidentiality.  I discussed the limitations of evaluation and management by telemedicine. The patient expressed understanding and agreed to proceed.  Provider was [name], PhD, RD, LDN, CEDRD Provider(s) located at home during this phone encounter; patient was at home  Session Time:  1100-1130 (30 minutes)  Reason for telephone visit: Follow-up of Medical Nutrition Therapy for anorexia nervosa, restricting type (F50.01)  Relevant history/background: Jamariya continues to have anxiety about her weight, and although she establishes goals to nourish herself better and to exercise, she tends to be overwhelmed with care for her parents.  Chronic insomnia is likely contributing to Rin's inability to make better choices for herself.    Assessment:  Faron is still spending most of her time at her parents' house, although she occasionally has not gone when the home health aide and her sister are there. She still has not bought foods to keep at her own home, so is not getting any meals at home.   Sleep: Still getting ~4-5 hrs sleep/night, range 0-5 hrs/night.   Goals from 6/22:    Progress   1. Exercise at least 10 minutes 3 X wk.  Has not happened 2. Eat at least 3 real meals/day.   Usually getting 2 meals 3. Get at least 1 fruit and 2 veg svngs/day. Usually 1-2 X wk  24-hr recalll:  (Up at 4 AM; went to parents' ~11) B (11 AM)-  ??can't remember?? Snk ( AM)-   L ( PM)-  water Snk (2 PM)-  10 Triscuits, 1-2 oz cheese, diet Gatorade  D (6 PM)-  1 soy hotdog on bun, 1/2 c yellow squash, water Snk ( PM)-  diet Gatorade, water Typical day? Yes.    Intervention: Reviewed Temima's progress on goals from the past month, including  the amount of time she spends at her parents'.    Recommendations: See Patient Instructions.    Follow-up: 4 weeks, via MyChart video (if Konica can resolve MyChart issues).    [name] SYKES

## 2018-09-28 ENCOUNTER — Ambulatory Visit (INDEPENDENT_AMBULATORY_CARE_PROVIDER_SITE_OTHER): Payer: Medicare Other | Admitting: Family Medicine

## 2018-09-28 ENCOUNTER — Other Ambulatory Visit: Payer: Self-pay

## 2018-09-28 DIAGNOSIS — F5001 Anorexia nervosa, restricting type: Secondary | ICD-10-CM

## 2018-09-28 NOTE — Progress Notes (Signed)
Telehealth Encounter I connected with Becky [name] (MRN [id number]) on 09/28/2018 by MyChart video-enabled, HIPAA-compliant telemedicine application, verified that I am speaking with the correct person using two identifiers, and that the patient was in a private environment conducive to confidentiality.  I discussed the limitations of evaluation and management by telemedicine. The patient expressed understanding and agreed to proceed.  Provider was [name], PhD, RD, LDN, CEDRD Provider(s) located at home during this phone encounter; patient was at home  Session Time:  1100-1130 (30 minutes)  Reason for telephone visit: Follow-up of Medical Nutrition Therapy for anorexia nervosa, restricting type (F50.01)  Relevant history/background: Becky [name] continues to have anxiety about her weight, and although she establishes goals to nourish herself better and to exercise, she has been  overwhelmed with care for her parents.  Still suffering from chronic insomnia as well.   Assessment: Becky [name] said "things are kind of rocky."  She and her sister are trying to determine the responsibilities for her parents' care they will share.  The plan is for Becky [name] to  have a half-day off on Thursday & Friday and full day off on Saturday.  On other days she would go to her parents' ~ 67 AM until 6:30 PM.  Her life seems consumed by her parent care just now, with little attn to her own needs.  For the past few months, Becky [name] has felt afraid of walking outside by herself, even if accompanied by her dog.  Although she is slightly less anxious in her own neighborhood (vs her parents'), she has not managed to create this routine.    Sleep: ~3-4 hrs sleep/night; range 0-5 hrs/night.   Goals from 7/20:    Progress   1. Exercise at least 10 minutes 3 X wk.  Has not happened; fearful of walking outside 2. Eat at least 3 real meals/day.   Usually 2 meals/day (lunch & dinner) 3. Get at least 1 fruit and 2 veg  svngs/day. Up to 3 X wk   No food recall done today.  Intervention: Reviewed Becky [name]'s progress on goals from the past month, and explored reasons she has not been able to meet her goals.  I tried to emphasize how happy her dog will be to walk outside with her.  Also suggested that getting back to documenting progress on goals may be helpful to follow-through.  I will mail her Goals Sheet; she has no way to print out an emailed one.    Recommendations: See Patient Instructions.    Follow-up: 4 weeks, via MyChart video.    Becky [name]

## 2018-09-28 NOTE — Patient Instructions (Addendum)
Goals: 1. Exercise at least 10 minutes 3 X wk.      Walk before you go to your parents' each day.  2. Eat at least 3 real meals/day.      Make sure you buy foods YOU can eat when you shop for your parents.       Drop your foods off at home before taking groceries to your parents'.     Eat breakfast before you go to your parents'.  3. Get at least1 fruit and1vegservings per day.       Make use of frozen vegetables for a quick, easy back-up option.  (Keep the freezer well stocked!)  Be sure you put your Goals Sheet in a place you'll see it frequently during the day.    Provide weekly updates - specifically how you are doing on your goals - on [email or web address].[email or web address]/.    Follow-up appt via MyChart on Monday, September 14 at 11 AM.

## 2018-10-26 ENCOUNTER — Other Ambulatory Visit: Payer: Self-pay

## 2018-10-26 ENCOUNTER — Ambulatory Visit (INDEPENDENT_AMBULATORY_CARE_PROVIDER_SITE_OTHER): Payer: Medicare Other | Admitting: Family Medicine

## 2018-10-26 DIAGNOSIS — F5001 Anorexia nervosa, restricting type: Secondary | ICD-10-CM | POA: Diagnosis not present

## 2018-10-26 NOTE — Progress Notes (Signed)
Telehealth Encounter PCP: Kathryne Eriksson, MD Therapist: Rea College, APRN, MSN, CS ([facility] Psychiatrist: [name], MD I connected with Becky [name] (MRN [id number]) on 10/26/2018 by MyChart video-enabled, HIPAA-compliant telemedicine application, verified that I am speaking with the correct person using two identifiers, and that the patient was in a private environment conducive to confidentiality.  I discussed the limitations of evaluation and management by telemedicine. The patient expressed understanding and agreed to proceed.  Provider was [name], PhD, RD, LDN, CEDRD Provider(s) located at home during this phone encounter; patient was at home  Session Time:  1100-1130 (30 minutes)  Reason for telephone visit: Follow-up of Medical Nutrition Therapy for anorexia nervosa, restricting type (F50.01)  Relevant history/background: Becky [name] continues to have anxiety about her weight, and although she establishes goals to nourish herself better and to exercise, she has been  overwhelmed with care for her parents.  Still suffering from chronic insomnia as well.   Assessment: [name] tracked her progress using her Goals Sheet.  Her food restriction has continued to be severe; she is usually eating only 1-2 items per day, and they are mostly snacks instead of real meals.  She has been walking with her sister some, and has also been doing some light weights 1-2 X wk.    Most concerning is that Becky [name] has expressed suicidal thoughts via http://www.richardson.info/.  When asked about this today, she said she still feels the same, but promised she will not act on these thoughts.  She also promised she will tell her therapist Rea College about suicidal thinking.  She has an appt with [name] on Oct 1.  Becky [name] said she absolutely will not discuss this with her sister b/c she is convinced that this would make her sister "very mad." Sleep: Still ~3-4 hrs sleep/night; range  0-5 hrs/night.   Goals from 8/17:    Progress   1. Exercise at least 10 minutes 3 X wk.  3-4 X wk 2. Eat at least 3 real meals/day 3 X wk.  Up to 1 meal/day (dinner) 3. Get at least 1 fruit and 2 veg svngs/day. 1-2 X wk   24-hr recall suggests intake of ~340 kcal:  (Up at 4 AM) B ( AM)-  ---  Martin Majestic to parents' ~noon -  Snk ( AM)-  1 [facility] protein bar (10 g pro, 200 kcal) L ( PM)-  Diet Gatorade Snk ( PM)-  1 banana, water D ( PM)-  --- Snk ( PM)-  --- Typical day? Yes.    Intervention: Discussed obstacles to Takya's eating more frequently or to getting actual meals. Serah could not come up with any specific reasons she hasn't been able to get herself to eat.  I told her that both her sleep deprivation and food restriction are likely contributing to depression and to difficulty making good choices for herself.    Recommendations: See Patient Instructions.    Follow-up: 4 weeks, via MyChart video.    [name] Taraya Steward

## 2018-10-26 NOTE — Patient Instructions (Addendum)
You have been sleep-deprived for quite a while now.  In addition, you have been getting inadequate nutrition for months now.  This combination is especially detrimental to both your mood and your decision-making.  Your brain wants a constant source of glucose.    Today:  Bring Becky [name] to your parents', go shopping, drop groceries at home before going back to your parents'.  While out, pick up lunches for your parents and you.    Goals: 1. Exercise at least 15 minutes 3 X wk. Walk before you go to your parents' each day. 2. Eat at least 4 times per day. Make sure you buy foods YOU can eat when you shop for your parents.  Drop your foods off at home before taking groceries to your parents'. Eat breakfast before you go to your parents'.  3. Get at least1 fruit and1vegservings per day.  Make use of frozen vegetables for a quick, easy back-up option. (Keep the freezer well stocked!)  Complete Goals Sheet.   At least weekly reports on http://www.richardson.info/:    How many times have you met each of your goals?  If goals aren't met, what can you do to help ensure that you meet them tomorrow?  You have committed to me:  You will not harm yourself. You will talk to Becky [name] about recent suicidal thoughts.

## 2018-11-18 NOTE — Progress Notes (Signed)
PATIENT: Becky [name] DOB: December 26, 1955  REASON FOR VISIT: follow up HISTORY FROM: patient  HISTORY OF PRESENT ILLNESS: Today 11/19/18  Becky [name] is a 63 year old female with history of significant depression and reported memory disturbance.  She was last seen in January 2019 by Dr. Jannifer Franklin.  She had MRI of the brain that showed minimal white matter changes, no significant atrophy.  She was sent for neuropsychological evaluation, but this was not done.  She has history of tremor has been on Cogentin.  She also has history of headaches. Her last memory score was 29/30.  She is here with her sister.  She continues to live alone.  She continues to help her siblings care for her elderly parents with dementia.  She finds this to be stressful and taxing on her.  In nearly 2 years since she was last seen, she reports her memory has declined.  She has a hard time remembering words, she may not remember conversations.  Her sister indicates there are times where it appears she is overmedicated, and slurring her words.  She does not sleep well at night, she takes [place] and trazodone.  She reports at times she may have hallucinations and hear voices.  She has close follow-up with a psychiatrist and nutritionist.  She has history of anorexia.  Since last seen she has lost nearly 30 pounds.  She is able to drive a car and perform her own ADLs.  She does manage her medications.  When asked which he enjoys during the day, she says not much, but she does have a dog and she may walk with her sister for exercise.  She is planning to go camping soon.  She presents today for follow-up.  HISTORY 03/13/2017 Dr. Jannifer Franklin: Becky [name] is a 63 year old right-handed white female with a history of significant depression requiring hospitalizations in the past.  The patient indicates that her depression has worsened recently as she has been under stress taking care of her parents.  She is now living with her parents  and both parents have dementia.  The patient has reported some problems with insomnia, she will typically wake up around 3 AM and not get back to sleep.  The patient is on trazodone for this.  Over the last year she has noted some problems with word finding and some short-term memory issues.  She still operates a motor vehicle without difficulty, she is having some difficulty keeping up with medications and appointments because she is now keeping up with both of her parents as well.  She is able to manage the finances fairly well.  The patient does report some fatigue during the day, she does not fall asleep however when inactive.  The patient has a history of a tremor, she was seen by Dr. Erling Cruz in the past and was placed on Cogentin which she continues taking 0.5 mg twice daily.  The patient has a history of migraine headaches, she is having daily headaches currently.  She was placed on Topamax 25 mg twice daily in the summer 2018.  The patient remains on several psychoactive medications including Geodon, clonazepam, Wellbutrin, Cymbalta, trazodone, and Topamax.  She is sent to this office for further evaluation.  REVIEW OF SYSTEMS: Out of a complete 14 system review of symptoms, the patient complains only of the following symptoms, and all other reviewed systems are negative.  Memory loss, depression, anxiety, insomnia  ALLERGIES: Allergies  Allergen Reactions  . Methadone Shortness Of  Breath and Other (See Comments)    Chest Pain Chest Pain Chest Pain  . Nitrofurantoin Shortness Of Breath and Other (See Comments)    Chest Pain Chest Pain Chest Pain  . Oxycodone-Acetaminophen Other (See Comments) and Shortness Of Breath    Chest pain Chest pain  . Percocet [Oxycodone-Acetaminophen] Shortness Of Breath and Other (See Comments)    Chest pain  . Penicillins Nausea And Vomiting, Rash, Other (See Comments) and Palpitations    Fever, including amoxicillin  Has patient had a PCN reaction causing  immediate rash, facial/tongue/throat swelling, SOB or lightheadedness with hypotension: Yes Has patient had a PCN reaction causing severe rash involving mucus membranes or skin necrosis: No Has patient had a PCN reaction that required hospitalization No Has patient had a PCN reaction occurring within the last 10 years: No If all of the above answers are "NO", then may proceed with Cephalosporin use.  Fever, including amoxicillin  Has patient had a PCN reaction causing immediate rash, facial/tongue/throat swelling, SOB or lightheadedness with hypotension: Yes Has patient had a PCN reaction causing severe rash involving mucus membranes or skin necrosis: No Has patient had a PCN reaction that required hospitalization No Has patient had a PCN reaction occurring within the last 10 years: No If all of the above answers are "NO", then may proceed with Cephalosporin use. Fever, including amoxicillin   . Amoxicillin Rash  . Aspirin Other (See Comments)    stomach pain stomach pain stomach pain  . Clarithromycin Rash and Other (See Comments)    Fever Fever Fever  . Codeine Nausea And Vomiting and Other (See Comments)  . Hydrocodone-Acetaminophen Itching    Premeditate benadryl  . Morphine Nausea And Vomiting and Other (See Comments)  . Ms Contin Cleone Slim Sulfate] Nausea And Vomiting    Morphine IR and ER  . Propoxyphene Nausea Only    Sick to stomach    HOME MEDICATIONS: Outpatient Medications Prior to Visit  Medication Sig Dispense Refill  . albuterol (PROAIR HFA) 108 (90 BASE) MCG/ACT inhaler INHALE 2 PUFFS INTO THE LUNGS EVERY 6 HOURS AS NEEDED 1 Inhaler 6  . albuterol (PROVENTIL) (2.5 MG/3ML) 0.083% nebulizer solution Take 3 mLs (2.5 mg total) by nebulization every 6 (six) hours as needed. DX:  493.00 FILE WITH MCR PART B 300 mL 5  . Benzonatate (TESSALON PERLES PO) Take by mouth.    . benztropine (COGENTIN) 0.5 MG tablet Take 0.5 mg by mouth at bedtime.     Marland Kitchen buPROPion (WELLBUTRIN  XL) 150 MG 24 hr tablet Take 450 mg by mouth every morning. Take 3 tablets by mouth daily to = 450mg     . BYSTOLIC 10 MG tablet     . DULoxetine (CYMBALTA) 60 MG capsule Take 120 mg by mouth daily. Use 30 mg and 60 mg capsules    . EPIPEN 2-PAK 0.3 MG/0.3ML SOAJ injection See admin instructions.  1  . eszopiclone (LUNESTA) 2 MG TABS tablet Take 2 mg by mouth at bedtime as needed for sleep. Take immediately before bedtime    . ferrous gluconate (FERGON) 324 MG tablet Take 324 mg by mouth daily with breakfast.    . hydrochlorothiazide (HYDRODIURIL) 12.5 MG tablet Take 12.5 mg by mouth daily.  0  . ibuprofen (ADVIL,MOTRIN) 200 MG tablet Take 800 mg by mouth daily as needed for headache or mild pain. Alternates between 600 mg Rx and 4-200 mg OTC    . meclizine (ANTIVERT) 12.5 MG tablet Take 12.5 mg by mouth daily  as needed for dizziness.    . metFORMIN (GLUCOPHAGE-XR) 500 MG 24 hr tablet     . nebivolol (BYSTOLIC) 10 MG tablet Take 1 tablet (10 mg total) by mouth daily. 30 tablet 0  . omeprazole (PRILOSEC) 20 MG capsule Take 40 mg by mouth daily.     Marland Kitchen OMEPRAZOLE PO     . promethazine (PHENERGAN) 25 MG tablet Take 25 mg by mouth every 6 (six) hours as needed for nausea.     Marland Kitchen topiramate (TOPAMAX) 25 MG tablet 25 mg daily.     . traZODone (DESYREL) 50 MG tablet Take 112.5 mg by mouth at bedtime.     . vitamin B-12 (CYANOCOBALAMIN) 1000 MCG tablet Take 1,000 mcg by mouth daily.    Grant Ruts INHUB 250-50 MCG/DOSE AEPB INHALE ONE PUFF BY MOUTH EVERY 12 HOURS FOR ASTHMA MAINTENANCE    . ziprasidone (GEODON) 60 MG capsule Take 60 mg by mouth 2 (two) times daily.      No facility-administered medications prior to visit.     PAST MEDICAL HISTORY: Past Medical History:  Diagnosis Date  . Anemia   . Anorexia nervosa   . Anxiety   . Arthritis    bilateral knees  . Asthma    mainly problems in cold weather  . Chronic pain syndrome   . Depression   . Diabetes mellitus   . Encounter for long-term  (current) use of other medications   . Gastroparesis    botox injections  . GERD (gastroesophageal reflux disease)   . History of blood transfusion   . Hyperglycemia   . Hypertension   . MCI (mild cognitive impairment) 03/13/2017  . Migraine   . Migraine   . Multiple allergies   . Murmur, cardiac    seen cardiologist in past - no workup required  . Neuropathy    right foot  . Painful respiration   . Personality change due to conditions classified elsewhere   . Raynaud's syndrome   . Tremor   . Ventral hernia   . Vitamin D deficiency     PAST SURGICAL HISTORY: Past Surgical History:  Procedure Laterality Date  . BOTOX INJECTION N/A 11/18/2012   Procedure: BOTOX INJECTION;  Surgeon: Missy Sabins, MD;  Location: [facility];  Service: Endoscopy;  Laterality: N/A;  . BOTOX INJECTION N/A 09/30/2013   Procedure: BOTOX INJECTION;  Surgeon: Missy Sabins, MD;  Location: [facility];  Service: Endoscopy;  Laterality: N/A;  . BOTOX INJECTION N/A 03/29/2015   Procedure: BOTOX INJECTION;  Surgeon: Teena Irani, MD;  Location: [facility];  Service: Endoscopy;  Laterality: N/A;  . BREAST SURGERY    . CHOLECYSTECTOMY    . COLONOSCOPY    . COLONOSCOPY WITH PROPOFOL N/A 03/29/2015   Procedure: COLONOSCOPY WITH PROPOFOL;  Surgeon: Teena Irani, MD;  Location: [facility];  Service: Endoscopy;  Laterality: N/A;  . ESOPHAGOGASTRODUODENOSCOPY  08/20/2011   Procedure: ESOPHAGOGASTRODUODENOSCOPY (EGD);  Surgeon: Missy Sabins, MD;  Location: [facility];  Service: Endoscopy;  Laterality: N/A;  . ESOPHAGOGASTRODUODENOSCOPY N/A 11/18/2012   Procedure: ESOPHAGOGASTRODUODENOSCOPY (EGD);  Surgeon: Missy Sabins, MD;  Location: [facility];  Service: Endoscopy;  Laterality: N/A;  . ESOPHAGOGASTRODUODENOSCOPY N/A 09/30/2013   Procedure: ESOPHAGOGASTRODUODENOSCOPY (EGD);  Surgeon: Missy Sabins, MD;  Location: [facility];  Service: Endoscopy;  Laterality: N/A;  . ESOPHAGOGASTRODUODENOSCOPY (EGD) WITH PROPOFOL  N/A 03/29/2015   Procedure: ESOPHAGOGASTRODUODENOSCOPY (EGD) WITH PROPOFOL;  Surgeon: Teena Irani, MD;  Location: [facility];  Service: Endoscopy;  Laterality: N/A;  . HERNIA REPAIR    . LUMBAR LAMINECTOMY/DECOMPRESSION MICRODISCECTOMY Right 12/07/2013   Procedure: LUMBAR LAMINECTOMY/DECOMPRESSION MICRODISCECTOMY RIGHT  LUMBAR FIVE-SACRAL ONE ,FORAMINOTOMY;  Surgeon: Floyce Stakes, MD;  Location: [facility];  Service: Neurosurgery;  Laterality: Right;  . NISSEN FUNDOPLICATION  [id number]   Kaylyn Lim  . PEG TUBE PLACEMENT     removed 1996  . TONSILLECTOMY AND ADENOIDECTOMY      FAMILY HISTORY: Family History  Problem Relation Age of Onset  . Urinary tract infection Mother   . Sleep disorder Mother   . Dementia Mother   . Diabetes Father   . High blood pressure Father   . Cancer - Prostate Father   . Dementia Father   . Emphysema Paternal Grandfather        was a smoker    SOCIAL HISTORY: Social History   Socioeconomic History  . Marital status: Single    Spouse name: Not on file  . Number of children: 0  . Years of education: college  . Highest education level: Not on file  Occupational History  . Occupation: Disabled    Fish farm manager: UNEMPLOYED  Social Needs  . Financial resource strain: Not on file  . Food insecurity    Worry: Not on file    Inability: Not on file  . Transportation needs    Medical: Not on file    Non-medical: Not on file  Tobacco Use  . Smoking status: Former Smoker    Packs/day: 0.50    Years: 2.00    Pack years: 1.00    Types: Cigarettes    Quit date: 02/11/1993    Years since quitting: 25.7  . Smokeless tobacco: Never Used  Substance and Sexual Activity  . Alcohol use: No    Comment: quit ETOH in 1982  . Drug use: No  . Sexual activity: Not on file  Lifestyle  . Physical activity    Days per week: Not on file    Minutes per session: Not on file  . Stress: Not on file  Relationships  . Social Herbalist on phone: Not on file     Gets together: Not on file    Attends religious service: Not on file    Active member of club or organization: Not on file    Attends meetings of clubs or organizations: Not on file    Relationship status: Not on file  . Intimate partner violence    Fear of current or ex partner: Not on file    Emotionally abused: Not on file    Physically abused: Not on file    Forced sexual activity: Not on file  Other Topics Concern  . Not on file  Social History Narrative   Patient lives at home alone divorced.   Disabled   [employer] education.   Right handed   Caffeine- 48oz diet coke             PHYSICAL EXAM  Vitals:   11/19/18 1146  BP: 94/72  Pulse: 64  Temp: 97.8 F (36.6 C)  Weight: 133 lb (60.3 kg)  Height: 5\' 3"  (1.6 m)   Body mass index is 23.56 kg/m.  Generalized: Well developed, in no acute distress  MMSE - Mini Mental State Exam 11/19/2018 03/13/2017  Orientation to time 4 5  Orientation to Place 4 5  Registration 3 3  Attention/ Calculation 3 5  Recall 2 2  Language- name 2 objects 2 2  Language- repeat 1 1  Language- follow 3 step command 1 3  Language- read & follow direction 1 1  Write a sentence 1 1  Copy design 1 1  Total score 23 29    Neurological examination  Mentation: Alert oriented to time, place, history taking. Follows all commands speech and language fluent Cranial nerve II-XII: Pupils were equal round reactive to light. Extraocular movements were full, visual field were full on confrontational test. Facial sensation and strength were normal.  Head turning and shoulder shrug  were normal and symmetric. Motor: The motor testing reveals 5 over 5 strength of all 4 extremities. Good symmetric motor tone is noted throughout.  Sensory: Sensory testing is intact to soft touch on all 4 extremities. No evidence of extinction is noted.  Coordination: Cerebellar testing reveals good finger-nose-finger and heel-to-shin bilaterally.  Gait and station: Gait  is normal. Tandem gait is normal. Romberg is negative. No drift is seen.  Reflexes: Deep tendon reflexes are symmetric and normal bilaterally.   DIAGNOSTIC DATA (LABS, IMAGING, TESTING) - I reviewed patient records, labs, notes, testing and imaging myself where available.  Lab Results  Component Value Date   WBC 4.4 11/30/2013   HGB 11.9 (L) 11/30/2013   HCT 36.0 11/30/2013   MCV 84.1 11/30/2013   PLT 291 11/30/2013      Component Value Date/Time   NA 131 (L) 11/30/2013 1036   K 4.0 11/30/2013 1036   CL 92 (L) 11/30/2013 1036   CO2 27 11/30/2013 1036   GLUCOSE 105 (H) 11/30/2013 1036   BUN 15 11/30/2013 1036   CREATININE 0.85 11/30/2013 1036   CALCIUM 9.3 11/30/2013 1036   PROT 6.7 11/24/2006 0953   AST 16 11/24/2006 0953   ALT 9 11/24/2006 0953   ALKPHOS 84 11/24/2006 0953   BILITOT 0.3 11/24/2006 0953   GFRNONAA 75 (L) 11/30/2013 1036   GFRAA 87 (L) 11/30/2013 1036   Lab Results  Component Value Date   CHOL 220 09/13/2009   HDL 70 09/13/2009   LDLCALC 118 09/13/2009   TRIG 159 09/13/2009   Lab Results  Component Value Date   HGBA1C 5.5 09/13/2009   Lab Results  Component Value Date   VITAMINB12 231 (L) 03/13/2017   Lab Results  Component Value Date   TSH 3.67 09/19/2009    ASSESSMENT AND PLAN 63 y.o. year old female  has a past medical history of Anemia, Anorexia nervosa, Anxiety, Arthritis, Asthma, Chronic pain syndrome, Depression, Diabetes mellitus, Encounter for long-term (current) use of other medications, Gastroparesis, GERD (gastroesophageal reflux disease), History of blood transfusion, Hyperglycemia, Hypertension, MCI (mild cognitive impairment) (03/13/2017), Migraine, Migraine, Multiple allergies, Murmur, cardiac, Neuropathy, Painful respiration, Personality change due to conditions classified elsewhere, Raynaud's syndrome, Tremor, Ventral hernia, and Vitamin D deficiency. here with:   1. Reported memory disturbance  2. History of depression   I  had a very pleasant visit with Ms. Tragesser and her sister.  As was discussed during her last visit with Dr. Jannifer Franklin in January 2019.  She will stop Topamax, slowly taper off Cogentin (taking 0.5 mg at bedtime).  I will place a referral again for neuropsychologist.  She is on a multitude of medications, and it is possible her symptoms may be medication related, along with her underlying psychiatric history.  She has a low blood pressure today, I have asked her to have her primary doctor evaluate the necessity of Bystolic and hydrochlorothiazide.  At times, low blood pressure may be contributing to some  of her symptoms.  MRI of the brain in February 2019 shows minimal white matter changes, no significant atrophy.  Pseudo-dementia associated with depression needs to be considered.  She will follow-up in 6 months with Dr. Jannifer Franklin or sooner if needed.  I did advise her symptoms worsen or she develops any new symptoms she should let us know.  I spent 25 minutes with the patient. 50% of this time was spent discussing her plan of care.    Butler Denmark, AGNP-C, DNP 11/19/2018, 12:22 PM [facility] [address], [place], [place] [address] [phone]

## 2018-11-19 ENCOUNTER — Encounter: Payer: Self-pay | Admitting: Neurology

## 2018-11-19 ENCOUNTER — Other Ambulatory Visit: Payer: Self-pay

## 2018-11-19 ENCOUNTER — Ambulatory Visit (INDEPENDENT_AMBULATORY_CARE_PROVIDER_SITE_OTHER): Payer: Medicare Other | Admitting: Neurology

## 2018-11-19 VITALS — BP 94/72 | HR 64 | Temp 97.8°F | Ht 63.0 in | Wt 133.0 lb

## 2018-11-19 DIAGNOSIS — F339 Major depressive disorder, recurrent, unspecified: Secondary | ICD-10-CM | POA: Diagnosis not present

## 2018-11-19 DIAGNOSIS — F32A Depression, unspecified: Secondary | ICD-10-CM | POA: Insufficient documentation

## 2018-11-19 DIAGNOSIS — G3184 Mild cognitive impairment, so stated: Secondary | ICD-10-CM | POA: Diagnosis not present

## 2018-11-19 DIAGNOSIS — F329 Major depressive disorder, single episode, unspecified: Secondary | ICD-10-CM | POA: Insufficient documentation

## 2018-11-19 HISTORY — DX: Major depressive disorder, single episode, unspecified: F32.9

## 2018-11-19 NOTE — Patient Instructions (Signed)
1. Stop Topamax 2. Try to taper off Cogentin  3. Refer you to neuropsychologist  4. Talk with your primary doctor about bystolic HCT, BP low today 94/72 5. Return in 6 months

## 2018-11-19 NOTE — Progress Notes (Signed)
I have read the note, and I agree with the clinical assessment and plan.  Charles K Willis   

## 2018-11-23 ENCOUNTER — Ambulatory Visit (INDEPENDENT_AMBULATORY_CARE_PROVIDER_SITE_OTHER): Payer: Medicare Other | Admitting: Family Medicine

## 2018-11-23 ENCOUNTER — Other Ambulatory Visit: Payer: Self-pay

## 2018-11-23 DIAGNOSIS — F5001 Anorexia nervosa, restricting type: Secondary | ICD-10-CM

## 2018-11-23 NOTE — Patient Instructions (Addendum)
TODAY: Shop for groceries at 2 PM.  Bring groceries home before going back to parents'.   Cereal, milk, yogurt, fruit, peanut butter, soy burgers, buns, frozen, carrots.  Goals remain the same:  1. Exercise at least 15 minutes 3 X wk. 2. Eat at least 4 times per day. Eat breakfast before you go to your parents'.      Make sure you buy foods YOU can eat when you shop for your parents.  Drop your foods off at home before taking groceries to your parents'. 3. Get at least1 fruit and2vegservingsper day.  Make use of frozen vegetables for a quick, easy back-up option. (Keep the freezer well stocked!)  Follow-up:  Telehealth in 1 month via MyChart video, Monday, Novemer 9 at 11 AM.

## 2018-11-23 NOTE — Progress Notes (Signed)
Telehealth Encounter PCP: Kathryne Eriksson, MD Therapist: Rea [employer], APRN, MSN, CS ([facility] Psychiatrist: [name], MD I connected with Becky [name] (MRN [id number]) on 11/23/2018 by MyChart video-enabled, HIPAA-compliant telemedicine application, verified that I am speaking with the correct person using two identifiers, and that the patient was in a private environment conducive to confidentiality.  I discussed the limitations of evaluation and management by telemedicine. The patient expressed understanding and agreed to proceed.  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  Still suffering from chronic insomnia as well.   Assessment: Lashel has not sent progress reports on behavioral goals.  She has spent even more time at her parents' following her dad's hernia surgery and subsequent accidental overdose of pain medication necessitating a 911 call. She has also struggled with what she views as excessive criticism from her sister [name].  Her ongoing depression is severe.   Suicidal ideation:  She has not recently talked with therapist Rea [employer] about suicidal thoughts, although Ambor again committed to me verbally that she would not act on any of these thoughts.   Ashlen's sister [name] will be going with [name] to see [name] next week.  They are both concerned about some symptoms Avanthika has experienced, which she has had for a few months now, but has mentioned to no one - hearing voices.   She has an appt in January for a neuro-psych evaluation.  Emorie shared this today, hoping it might help to explain her difficulties following through with established goals.   Sleep: Still ~3-4 hrs sleep/night; range 0-5 hrs/night.   Goals from 9/14:    Progress   1. Exercise at least 15 minutes 3 X wk.  10 X in past 4 wks     Walk before going to parents'  Walked w/sister from parents' 2. Eat at least 4 X day.    Most days 1 meal/day (dinner)     Bkfst before going to parents'  Did only 3 X  3. Get at least 1 fruit and 2 veg svngs/day. 1-2 X wk     Frozen veg's on hand   Has none.  No Food Recall today.    Intervention: Reviewed Jamesa's past month, and again explored the connection between Syanne's stress levels (related to interpersonal interactions with both her father and sister) and her food restriction.    For recommendations and goals, see Patient Instructions.    Follow-up: 4 weeks, via MyChart video.    [name] Kathren Scearce

## 2018-12-21 ENCOUNTER — Ambulatory Visit (INDEPENDENT_AMBULATORY_CARE_PROVIDER_SITE_OTHER): Payer: Medicare Other | Admitting: Family Medicine

## 2018-12-21 DIAGNOSIS — F5001 Anorexia nervosa, restricting type: Secondary | ICD-10-CM | POA: Diagnosis not present

## 2018-12-21 NOTE — Patient Instructions (Addendum)
-   Please speak with [name] about suicidal thoughts you have had and about hearing and seeing things you don't think are really there.  - Suicide help line: [phone].  - Call [employer] for an appt THIS WEEK.  Email or send [email or web address] message as to when your appt with [name] is.    Goals: 1. Exercise at least 15 minutes 3 X wk.  2. Eat at least 4 X day, including breakfast before going to parents'.  3. Get at least 1 fruit and 2 veg svngs/day.  Keep frozen veg's on hand at home as well at parents'.    Follow-up: 4 weeks, via MyChart video on Monday, Dec 7 at 11 AM.

## 2018-12-21 NOTE — Progress Notes (Signed)
Telehealth Encounter PCP: Kathryne Eriksson, MD Therapist: Rea College, APRN, MSN, CS ([facility] Psychiatrist: [name], MD I connected with Becky [name] (MRN [id number]) on 12/21/2018 by teelphone (lost WiFi, so could not use video platform), verified that I am speaking with the correct person using two identifiers, and that the patient was in a private environment conducive to confidentiality.  The patient  agreed to proceed.  Provider was [name], PhD, RD, LDN, CEDRD Provider(s) located at [facility] during this phone encounter; patient was at home  Session Time:  1100-1130 (30 minutes)  Reason for telephone visit: Follow-up of Medical Nutrition Therapy for anorexia nervosa, restricting type (F50.01)  Relevant history/background: Becky [name] continues to have anxiety about her weight, and although she establishes goals to nourish herself better and to exercise, she has been  overwhelmed with care for her parents.  Still suffering from chronic insomnia as well.   Assessment: [name] seemed confused for much of today's appt, asking that several questions be repeated, and unable to definitively remember what she ate/did yesterday.  She has sent no progress reports on behavioral goals, although she has been documenting progress on goals using the Goals Sheet provided.  Depression remains severe.  She has not spoken with therapist [employer]a [employer] re. symptoms of hearing voices and seeing things (i.e., her mother's cat Noir).  These have been going on for several months, although [name] has not shared this with anyone until recently.   Suicidal ideation:  Continues to have suicidal thoughts, sometimes related to the voices she hears.   Sleep: Still ~3-4 hrs sleep/night; range 0-5 hrs/night.   Goals from 10/12:    Progress   1. Exercise at least 15 minutes 3 X wk.  Walked ~10 X in past 4 wks, including 90 min hiking 1 X 2. Eat at  least 4 X day.    Eating 2-3 X day most days      Bkfst before going to parents'  Most days  3. Get at least 1 fruit and 2 veg svngs/day. 4 X wk     Frozen veg's on hand   Currently has some at parents' house; usually eating at parents'.  24-hr recall:  (Up at 8 AM) B (8:15 AM)-  1 c Special K, 1/2 c soy milk Snk ( AM)-  water L (12 PM)-  --- Snk ( PM)-  water D (5 PM)-  1 c mashed potatoes, 1/2 c peas, water Snk ( PM)-  --- Typical day? Yes.   Usually getting bkfast and dinner only.    Intervention: Reviewed [name]'s past month, and encouraged her to speak with her therapist about SI and voices she hears and people/things she sees.  [name] agreed to make an appt with [name] this week to discuss these concerns.  Her sister [name] will be attending next week's therapy appt with [name] and [name] does not want to talk about these issues in front of her sister.    For recommendations and goals, see Patient Instructions.    Follow-up: 4 weeks, via MyChart video.    [name] Decklyn Hyder

## 2019-01-18 ENCOUNTER — Other Ambulatory Visit: Payer: Self-pay

## 2019-01-18 ENCOUNTER — Ambulatory Visit (INDEPENDENT_AMBULATORY_CARE_PROVIDER_SITE_OTHER): Payer: Medicare Other | Admitting: Family Medicine

## 2019-01-18 DIAGNOSIS — F50019 Anorexia nervosa, restricting type, unspecified: Secondary | ICD-10-CM

## 2019-01-18 DIAGNOSIS — F5001 Anorexia nervosa, restricting type: Secondary | ICD-10-CM

## 2019-01-18 NOTE — Progress Notes (Signed)
Telehealth Encounter PCP: Kathryne Eriksson, MD Therapist: Rea College, APRN, MSN, CS  Psychiatrist: [name], MD I connected with Becky [name] (MRN [id number]) on 01/18/2019 by [place] video platform, verified that I am speaking with the correct person using two identifiers, and that the patient was in a private environment conducive to confidentiality.  The patient  agreed to proceed.  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  Still suffering from chronic insomnia as well.   Assessment: [name] said "everyone" (= her siblings and her therapist) has advised her to stop spending so much time (7 days a week) at her parents', but she has yet to manage to stay away, even on most Saturdays when her sister is there.  She is at least usually eating breakfast at home before going there, but often misses lunch or dinner even though she makes those meals for her parents.   Suicidal ideation:  Continues to have suicidal thoughts at times, as well as hearing voices and images she does not believe are real, both of which she has talked with [facility] about.  Of concern is that [name] said she has no appt scheduled to see [name], who is on vacation this week.  [name] agreed to call to leave a message requesting an appt as soon as we finished today's encounter.    Sleep: Slightly better; ~5 hrs sleep/night; usually awake from ~12-3 AM.  Eating pattern: Usually eating 2 meals/day, sometimes 3.    Goals from 11/9:    Progress: 1. Exercise at  least 15 min 3 X wk.   Walking ~10 min 2-3 X wk 2. Eat at least 4 X day.    Eating 2 X day (sometimes 3)     Bkfst before going to parents'  Most days  3. Get at least 1 fruit & 2 veg svngs/day Most days getting F or V     Frozen veg's on hand   Has some at parents'   24-hr recall suggests intake of ~640 kcal:  (Up at 6 AM) B (8 AM)-  1 c Special K Protein, 3/4 c soy milk, 1/2 banana 280 Snk ( AM)-  water L (2 PM)-  1 soy chsburger on a bun, water   360 Snk ( PM)-  water D ( PM)-  water Snk ( PM)-  water Typical day? No.  More typical lunch is cheese or pb sandwich.   Intervention: Reviewed recent diet history as well as severity of distressing symptoms Kimball has expressed previously.   For recommendations and goals, see Patient Instructions.    Follow-up: 4 weeks, via MyChart video.    Becky [name]

## 2019-01-18 NOTE — Patient Instructions (Addendum)
-   Call for a follow-up appt with [name] so you can get scheduled soon after she returns from vacation.    Goals remain the same: 1. Exercise at least 15 minutes 3 X wk.  2. Eat at least 4 X day, including breakfast before going to parents'.  3. Get at least1 fruit and2vegsvngs/day.  Keep frozen veg's on hand at home as well at parents'.  - Bring your Goals Sheet with you every time you go to your parents'.  Clemetine Marker and summarize your progress on goals both weekly and just prior to our follow-up appt.  Please send [email or web address] messages at the end of each week to let me know your progress.    - Keep this suicide help line available if you continue to have some of these thoughts: [phone].  Follow-up appt via MyChart on Monday, January 4 at 11 AM.

## 2019-02-15 ENCOUNTER — Other Ambulatory Visit: Payer: Self-pay

## 2019-02-15 ENCOUNTER — Ambulatory Visit (INDEPENDENT_AMBULATORY_CARE_PROVIDER_SITE_OTHER): Payer: Medicare Other | Admitting: Family Medicine

## 2019-02-15 DIAGNOSIS — F5001 Anorexia nervosa, restricting type: Secondary | ICD-10-CM

## 2019-02-15 NOTE — Patient Instructions (Addendum)
Although you have said you'd like to work on getting exercise, you are currently not eating enough to fuel your body even without added exercise.  You absolutely need to eat more before I recommend any added physical activity.    Continue to work with Becky [name] and Opal Sidles to manage negative thoughts, as well as to increase time you spend at home (allowing your sister and the home health person to be at your parents' without you).   Make sure you stock up on foods at home, including fresh fruit, frozen vegetables, and protein foods.  Think about the foods you need to make a quick, easy meal for yourself.    Let's shift your focus to only one goal:   Goal: - Eat 4 times a day.  Document this on your Goals Sheet.  If you want to track fruit and veg intake, you can document that as well.    Report progress on [email or web address] weekly (each Monday).    Follow-up appt via MyChart on Monday, Feb 1 at 11 AM.

## 2019-02-15 NOTE — Progress Notes (Signed)
Telehealth Encounter PCP: Kathryne Eriksson, MD Therapist: Rea College, APRN, MSN, CS  Psychiatrist: [name], MD I connected with Becky [name] (MRN [id number]) on 02/15/2019 by MyChart video platform, verified that I am speaking with the correct person using two identifiers, and that the patient was in a private environment conducive to confidentiality.  The patient agreed to proceed. This note is not being shared with the patient for the following reason: To prevent harm (release of this note would result in harm to the life or physical safety of the patient or another). Patient is especially triggered by assessment of calorie intake as well as suggestion that she is not adhering to recommendations.    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  Still suffering from chronic insomnia as well.   Assessment: [name] said she knows she needs to follow dietary recommendations, but cannot manage to do so.  She also said she is completely confused as to what recommendations, even though I have made them as straight-forward as possible.  [name] also said she really wants to focus on getting consistent exercise, but I was adamant that I recommend against any added exercise until she consistently increases energy intake.  I suspect that omitting exercise goal actually might provide some relief b/c even though she wants to exercise, she has not been able to achieve this goal, and has repeatedly berated herself for it.   Hearing/seeing visions: Still  hearing things ~4 X wk and sees images of her mom's cat and a man in a hat ~4 X wk.  Although she has told [facility] about this, they have not really discussed it.   Suicidal ideation: Still has these thoughts 1-2 X wk.  Has discussed with both [name] and [facility], but [name] could not identify any specific recommendations provided by either regarding this other than that Dr. Albertine Patricia is looking into medication modifications.   Sleep: ~4 hrs sleep/night.    Goals from 12/7:    Progress: 1. Exercise at least 15 min 3 X wk.   Not doing 2. Eat at least 4 X day.    Eating 2 X day (occasionally 3)     Bkfst before going to parents'  Most days  3. Get at least 1 fruit & 2 veg svngs/day Most days getting F &/or V      Frozen veg's on hand   Has some at home   24-hr recall suggests intake of <500 kcal:  (Up at 7 AM) B ( AM)-  water Snk (11 AM)-  1 c Special Protein, 1/2 c soy milk, water  L ( AM)-   Snk ( PM)-  D (4:30 PM)-  1 soy hotdog, 1 sweet potato, 1/2 c canned pineapple, water Snk ( PM)-   Typical day? Yes.    Intervention: Reviewed recent diet and symptom history.  Modified behavioral goals to include only one, discouraging exercise until patient is better nourished.    For recommendations and goals, see Patient Instructions.    Follow-up: 4 weeks, via MyChart video.    Becky [name]

## 2019-03-02 ENCOUNTER — Ambulatory Visit: Payer: Medicare Other

## 2019-03-02 ENCOUNTER — Other Ambulatory Visit: Payer: Self-pay

## 2019-03-02 ENCOUNTER — Ambulatory Visit (INDEPENDENT_AMBULATORY_CARE_PROVIDER_SITE_OTHER): Payer: Medicare Other | Admitting: Psychology

## 2019-03-02 ENCOUNTER — Encounter: Payer: Self-pay | Admitting: Psychology

## 2019-03-02 DIAGNOSIS — F411 Generalized anxiety disorder: Secondary | ICD-10-CM | POA: Insufficient documentation

## 2019-03-02 DIAGNOSIS — F332 Major depressive disorder, recurrent severe without psychotic features: Secondary | ICD-10-CM | POA: Diagnosis not present

## 2019-03-02 DIAGNOSIS — F419 Anxiety disorder, unspecified: Secondary | ICD-10-CM | POA: Insufficient documentation

## 2019-03-02 DIAGNOSIS — F4310 Post-traumatic stress disorder, unspecified: Secondary | ICD-10-CM | POA: Diagnosis not present

## 2019-03-02 DIAGNOSIS — Z87828 Personal history of other (healed) physical injury and trauma: Secondary | ICD-10-CM

## 2019-03-02 NOTE — Progress Notes (Signed)
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 Exceptionally Low        Scaled Score Percentile   WAIS-IV Coding: 6 9 Below Average       NAB Attention Module, Form 1: T Score Percentile     Digits Forward 31 3 Well Below Average    Digits Backwards 44 27 Average       D-KEFS Color-Word Interference Test: Raw Score (Scaled Score) Percentile    Color Naming 45 secs. (5) 5 Well Below Average    Word Reading 40 secs. (2) <1 Exceptionally Low    Inhibition 87 secs. (7) 16 Below Average      Total Errors 0 errors (12) 75 Above Average    Inhibition/Switching 87 secs. (9) 37 Average      Total Errors 2 errors (11) 63 Average       D-KEFS Verbal Fluency Test: Raw Score (Scaled Score) Percentile     Letter Total Correct 27 (7) 16 Below Average    Category Total Correct 17 (3) 1 Exceptionally Low    Category Switching Total Correct 10 (7) 16 Below Average    Category Switching Accuracy 9 (8) 25 Average      Total Set Loss Errors 6 (5) 5 Well Below Average      Total Repetition Errors 0 (13) 84 Above Average       D-KEFS 20 Questions Test: Scaled Score Percentile       Total Weighted Achievement Score 1 <1 Exceptionally Low    Initial Abstraction Score 5 5 Well Below Average       [place]consin Card Sorting Test [facility]): Raw Score Percentile     Categories (trials) 1 (64) 6-10 Well Below Average    Total Errors 43 1 Exceptionally Low    Perseverative Errors 31 <1 Exceptionally Low    Non-Perseverative Errors 12 9 Below Average    Failure to Maintain Set 0 --- ---  Language:          NAB Language Module, Form 2: T Score Percentile     Auditory Comprehension 56 73 Average    Naming 28/31 (33) 5 Well Below Average       Visuospatial/Visuoconstruction:      Raw Score Percentile   Clock Drawing: 9/10 --- Within Normal Limits       NAB Spatial Module, Form 2: T Score Percentile     Figure Drawing Copy 44 27 Average    Figure Drawing Immediate Recall 57 75 Above Average        Scaled Score Percentile   WAIS-IV Matrix Reasoning: 5 5 Well Below Average  WAIS-IV Visual Puzzles: 7 16 Below Average       Mood and Personality:      Raw Score Percentile   Beck Depression Inventory - II: 49 --- Severe  PROMIS Anxiety Questionnaire: 24 --- Moderate  PTSD Checklist for DSM-5: 38 --- Above Threshold       Additional Questionnaires:      Raw Score Percentile   PROMIS Sleep Disturbance Questionnaire: 31 --- Moderate   Informed Consent and Coding/Compliance:   Ms. Centrella was provided with a verbal description of the nature and purpose of the present neuropsychological evaluation. Also reviewed were the foreseeable risks and/or discomforts and benefits of the procedure, limits of confidentiality, and mandatory reporting requirements of this provider. The patient was given the opportunity to ask questions and receive answers about the evaluation. Oral consent to participate was provided by the patient.   This evaluation was conducted by Christia Reading, Ph.D., licensed clinical neuropsychologist. Ms. Hopfinger completed a 30-minute clinical interview,  billed as one unit [phone], and 190 minutes of cognitive testing and scoring, billed as one unit [phone] and five additional units [id number]. Psychometrist Cruzita Lederer, B.S., assisted Dr. Melvyn Novas with test administration and scoring procedures. As a separate and discrete service, Dr. Melvyn Novas spent a total of 180 minutes in interpretation and report writing billed as one unit [phone] and three units [id number].

## 2019-03-02 NOTE — Progress Notes (Signed)
   Psychometrician Note   Becky [name] completed 145 minutes of neuropsychological testing with technician, Cruzita Lederer, B.S., under the supervision of Dr. Christia Reading, Ph.D., licensed psychologist. The patient did not appear overtly distressed by the testing session, per behavioral observation or via self-report to the technician. Rest breaks were offered.    In considering the patient's current level of functioning, level of presumed impairment, nature of symptoms, emotional and behavioral responses during the interview, level of literacy, and observed level of motivation/effort, a battery of tests was selected and communicated to the psychometrician.   Communication between the psychologist and technician was ongoing throughout the testing session and changes were made as deemed necessary based on patient performance on testing, technician observations and additional pertinent factors such as those listed above.   Becky [name] will return within approximately two weeks for an interactive feedback session with Dr. Melvyn Novas at which time his test performances, clinical impressions, and treatment recommendations will be reviewed in detail. The patient understands she can contact our office should she require our assistance before this time.  145 minutes were spent face-to-face with patient administering standardized tests, 30 minutes were spent scoring by the technician, and another 15 minutes were spent reviewing scored testing protocols by Dr. Melvyn Novas. [CPT [id number], [id number]  This note reflects time spent with the psychometrician and does not include test scores or any clinical interpretations made by Dr. Melvyn Novas. The full report will follow in a separate note.

## 2019-03-03 ENCOUNTER — Encounter: Payer: Self-pay | Admitting: Psychology

## 2019-03-03 DIAGNOSIS — F4310 Post-traumatic stress disorder, unspecified: Secondary | ICD-10-CM | POA: Insufficient documentation

## 2019-03-05 ENCOUNTER — Telehealth: Payer: Medicare Other

## 2019-03-09 ENCOUNTER — Other Ambulatory Visit: Payer: Self-pay

## 2019-03-09 ENCOUNTER — Telehealth: Payer: Medicare Other

## 2019-03-09 ENCOUNTER — Ambulatory Visit (INDEPENDENT_AMBULATORY_CARE_PROVIDER_SITE_OTHER): Payer: Medicare Other | Admitting: Psychology

## 2019-03-09 ENCOUNTER — Encounter: Payer: Self-pay | Admitting: Psychology

## 2019-03-09 DIAGNOSIS — F411 Generalized anxiety disorder: Secondary | ICD-10-CM

## 2019-03-09 DIAGNOSIS — F332 Major depressive disorder, recurrent severe without psychotic features: Secondary | ICD-10-CM

## 2019-03-09 DIAGNOSIS — F4310 Post-traumatic stress disorder, unspecified: Secondary | ICD-10-CM

## 2019-03-09 DIAGNOSIS — Z87828 Personal history of other (healed) physical injury and trauma: Secondary | ICD-10-CM

## 2019-03-09 NOTE — Progress Notes (Signed)
   Neuropsychology Feedback Session Tillie Rung. [place] Department of Neurology  Reason for Referral:   ASPIN TIMLIN a 64 y.o. Caucasian female referred by Butler Denmark, NP,to characterize hercurrent cognitive functioning and assist with diagnostic clarity and treatment planning in the context of subjective cognitive decline and several psychiatric comorbidities.  Feedback:   Ms. Winokur completed a comprehensive neuropsychological evaluation on 03/02/2019. Please refer to that encounter for the full report and recommendations. Briefly, results suggested primary difficulties surrounding processing speed, basic attention, executive functioning, and semantic fluency. Regarding psychological functioning, she reported severe rates of depression and moderate rates of anxiety during the past 1-2 weeks. She also endorsed symptoms consistent with an active diagnosis of PTSD. While I cannot rule out an underlying neurological process presently, the most likely etiology for her reported day-to-day difficulties and weaknesses across testing is her moderate to severe levels of ongoing psychiatric distress. These symptoms commonly influence cognitive domains where Ms. Churchfield exhibited her most pronounced weaknesses. Ongoing sleep dysfunction could also serve as a secondary culprit and influence similar areas of functioning. It is possible that Ms. Merkl could see an improvement in her functioning should these symptoms become more optimized.  Ms. Kienbaum was unaccompanied during her telephone call. Content of the current session focused on the results of her neuropsychological evaluation. Ms. Wilkerson was given the opportunity to ask questions and her questions were answered. she was also encouraged to reach out should additional questions arise. A copy of her report was mailed at the conclusion of the visit.      Between 30 and 60 minutes were spent preparing for  and conducting the current feedback session with Ms. Alatorre, billed as one unit [phone].

## 2019-03-15 ENCOUNTER — Ambulatory Visit (INDEPENDENT_AMBULATORY_CARE_PROVIDER_SITE_OTHER): Payer: Medicare Other | Admitting: Family Medicine

## 2019-03-15 ENCOUNTER — Other Ambulatory Visit: Payer: Self-pay

## 2019-03-15 DIAGNOSIS — F5001 Anorexia nervosa, restricting type: Secondary | ICD-10-CM | POA: Diagnosis not present

## 2019-03-15 NOTE — Patient Instructions (Addendum)
-   When you see [name] next, discuss the results of neuro tests, especially PTSD diagnosis.  - Make a follow-up appt with Dr. Albertine Patricia.    Goal remains the same:  1. Eat at least SOMEthing 4 times a day.       (Three actual meals + snacks is an ideal eating pattern.)  - Send a progress report on [email or web address] once a week.    - Follow-up: Monday, Mar 1 at 11 AM via MyChart video.

## 2019-03-15 NOTE — Progress Notes (Signed)
Telehealth Encounter PCP: Kathryne Eriksson, MD Therapist: [employer], APRN, MSN, CS  Psychiatrist: [name], MD I connected with LAYANI BRINKLEY (MRN [id number]) on 03/15/2019 by MyChart video platform, verified that I am speaking with the correct person using two identifiers, and that the patient was in a private environment conducive to confidentiality.  The patient agreed to proceed. This note is not being shared with the patient for the following reason: To prevent harm (release of this note would result in harm to the life or physical safety of the patient or another). Patient is especially triggered by assessment of calorie intake as well as suggestion that she is not adhering to recommendations.    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  Still suffering from chronic insomnia as well.   Assessment: Behavioral goals on 1/4 were simplified to just eating 4 X day.  Jovey was also to record progress weekly on HIPAA-compliant [email or web address] which she has not done.  She did send one message two days ago, saying she had managed to eat 3-4 X day, and has been feeling a bit better.  On January 14, she called PCP Kathryne Eriksson, MD to ask re. symptoms, concerned that she was experiencing dehydration.  Katja had a video encounter with psychologist Hazle Coca, PhD at [facility] last week, and she had a series of neuro tests, which indicated weak attention, processing, and execution, and severe depression and PTSD.  Tests indicated that memory  was normal, which Rithanya does not agree with.  I suspect perceived memory problems may be related to sleep deprivation, depression, and PTSD.  Hisayo blames herself for not making further progress in her therapy, which we discussed some today, and I suggested she talk to [facility] about.   Hearing/seeing visions: Still hearing things ~2 X wk and sees images of her mom's cat at least once a day.    Suicidal ideation: Still has these thoughts 1 X wk.   Dr. Albertine Patricia has not made any changes in med's.  Sleep: Still ~4 hrs sleep/night.   24-hr recall:  (Up at 6 AM) B (9 AM)-  1 [facility] protein bar (~20g pro, 180 kcal), water Snk ( AM)-  water L (2 PM)-  1 slc Swiss cheese, 1 slc bread, 1 orange, water Snk ( PM)-  water D (6:30 PM)-  1 soy hotdog on bun, water Snk ( PM)-  1 handful cracker, water Typical day? Yes.  Has been eating a fruit and/or veg 1-2 X day.    Intervention: Reviewed recent diet and symptom history.  Confirmed behavioral goal from 02/15/19.  Patient does not feel ready to take on more challenge at this time.      For recommendations and goals, see Patient Instructions.    Follow-up: 4 weeks, via MyChart video.    [name] Criss Pallone

## 2019-04-12 ENCOUNTER — Ambulatory Visit (INDEPENDENT_AMBULATORY_CARE_PROVIDER_SITE_OTHER): Payer: Medicare Other | Admitting: Family Medicine

## 2019-04-12 ENCOUNTER — Other Ambulatory Visit: Payer: Self-pay

## 2019-04-12 DIAGNOSIS — F5001 Anorexia nervosa, restricting type: Secondary | ICD-10-CM | POA: Diagnosis not present

## 2019-04-12 NOTE — Progress Notes (Signed)
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  Still suffering from chronic insomnia as well.   Assessment: Becky [name] requested an in-office visit to document weight.  Blind weight indicates that she has lost 10 lb since last documented weight in October 2020.  Becky [name] has made significant progress on eating frequency, however, now usually getting something to eat 4 X day.  She has continued to be at her parents' all days of the week except for Saturdays, when her sister is  there.  (Also goes on the other two days her sister is there, which she said is probably b/c of a desire for company).  Becky [name] sees Dr. Albertine Patricia next week, and will review med's at that time.  Sleep: Still ~4 hrs sleep/night.   24-hr recall:  (Up at 8 AM; had been awake long before; had water upon waking) B (10 AM)-  1 c granola, 1/2 c 2% milk, water Snk ( AM)-  --- L (1 PM)-  1 pb & banana sandwich, water Snk (2 PM)-  5 oz strawberry yogurt D (6 PM)-  1 grilled chs sandwich, water Snk ( PM)-  12 oz diet Gatorade Typical day? Yes.   Yesterday's aft snack might have been a protein bar.    Intervention: Reviewed recent diet and symptom history, and modified goal to include a minimum of 2 "real" meals daily.  Becky [name] did not feel ready to commit to 3 real meals.    For recommendations and goals, see Patient Instructions.    Follow-up: 4 weeks.    [name] Becky [name]

## 2019-04-12 NOTE — Patient Instructions (Addendum)
Ask Dr. Redmond Pulling about your eligibility for a COVID-19 vaccination in light of your respiratory problems.    Goals: 1. Eat at least 2 REAL meals and 2 snacks per day.  Eat breakfast within one hour of getting up.  Aim for no more than 5 hours between eating.   A REAL meal=  Breakfast includes at least protein and starch food.  Lunch and dinner includes at least protein, starch, and vegetables and/or fruit. (OR: Would you serve this to a guest in your home, and call it a meal?)  Be sure to stock up on foods both at home and at your parents' so you can eat these "real" meals.    Send weekly progress reports via [email or web address] (aim for Fridays).  Include how many days you met the goal for 3 real meals and also got a snack.    Follow-up IN-OFFICE appt: Monday, April 5 at 11 AM.

## 2019-05-17 ENCOUNTER — Other Ambulatory Visit: Payer: Self-pay

## 2019-05-17 ENCOUNTER — Ambulatory Visit (INDEPENDENT_AMBULATORY_CARE_PROVIDER_SITE_OTHER): Payer: Medicare Other | Admitting: Family Medicine

## 2019-05-17 DIAGNOSIS — F5001 Anorexia nervosa, restricting type: Secondary | ICD-10-CM

## 2019-05-17 NOTE — Patient Instructions (Addendum)
-  Add to the list of activities you can do that will be useful at your parents', for them or, with them.  For example:  - [place] for them.   - Spend time talking with your mom or just hanging out on the porch.   - Prepare breakfast and lunch.  - Take your  Mom to get her hair done (once a week).   - Take your  Mom on a drive a couple times a week.   - At the end of each day, write down what you accomplished that day.  Include EVERYTHING you did for your parents as well as anything else accomplished.    - Add to the list of activities you would like to accomplish for YOU.  For example:   - Give [name] a bath.   - Paint the spare bedroom.    - Go for a walk; walk [name].    Food Goals:  1. Eat at least 2 REAL meals and 2 snacks per day.  Eat breakfast within one hour of getting up.  Aim for no more than 5 hours between eating.   A REAL meal =              Breakfast includes at least protein and starch food.             Lunch and dinner includes at least protein, starch, and vegetables and/or fruit. (OR: Would you serve this to a guest in your home, and call it a meal?)  Send weekly progress reports via [email or web address] (aim for Fridays).  Include how many days you met the goal for 3 real meals and also got a snack.    Follow-up: Office visit on Monday, May 3 at 11 AM.

## 2019-05-17 NOTE — Progress Notes (Signed)
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  Still suffering from chronic insomnia as well.   Assessment: Anuhea has not been able to meet her goal of eating 4 X day.  When asked why she is eating so little, she cited her father's decline and current frailty.  She feels even more depressed than usual b/c of distress over losing her dad.  In addn, her sister has told Juleesa she is relieved of all duties with respect to her parents.  Elliette interprets this as being unneeded, although she doesn't know if her sister is instead concerned about Joely's wellbeing and sense of being overwhelmed.  Farra did record eating frequency on her Goals Sheet, but did not send weekly reports via [email or web address].info/.  She said she has difficulty focusing to do this task.  I suspect part of her inability to focus may be related to both food and sleep deprivation.  She again had short-term memory problems today, struggled to find words, and asked for questions to be repeated several times.     Weight: 120.4 lb, a decrease of 2.5 lb in one month.   Eating Pattern: Has had days of no eating at all.  Forgot her Goals Sheet at home, so not sure how often she is getting at least 2 real meals per day.   Sleep: Still ~5 hrs sleep/night.   24-hr recall:  (Up at 7 AM; hadn't fallen asleep till >4 AM) B (8 AM)-  1 c Raisin Bran, 1/2 c 2% milk, water Snk ( AM)-  water L (1:30 PM)-  1 grilled Cheddar sandwich, 1 small salad, 1 tbsp ranch, 1/2 c canned fruit, water Snk ( PM)-  water D (6 PM)-  3/4  c rice & beans, 1 small salad, 1 tbsp ranch, water Snk ( PM)-  water Typical day? No.  Ate more than usual.   Intervention: Reviewed recent diet and symptom history, and confirmed same goal to include a minimum of 2 "real" meals daily.  Tossie still does not feel ready to commit to 3 real meals.    For recommendations and goals, see Patient Instructions.    Follow-up: 4 weeks.    [name] SYKES

## 2019-05-27 ENCOUNTER — Ambulatory Visit: Payer: Medicare Other | Admitting: Neurology

## 2019-06-14 ENCOUNTER — Other Ambulatory Visit: Payer: Self-pay

## 2019-06-14 ENCOUNTER — Ambulatory Visit (INDEPENDENT_AMBULATORY_CARE_PROVIDER_SITE_OTHER): Payer: Medicare Other | Admitting: Family Medicine

## 2019-06-14 DIAGNOSIS — F5001 Anorexia nervosa, restricting type: Secondary | ICD-10-CM

## 2019-06-14 NOTE — Patient Instructions (Addendum)
-   Talk with [name] about your feelings related to your sister's dismissing you from parental responsibilities.  (Not being needed anymore, and everything you've done is wrong for the past 6 years, and [name] is "at me all the time" to "get over it.")  Grocery shopping TODAY:  Peanut butter Milk Yogurt Cereal Crackers Protein bars Nuts Fresh fruit  Goals: 1. Include at least one Orgain protein drink per day.  Use 1/2 dose per 12 to 16 oz of milk.   2. Eat at least 2 real meals and 2 snacks daily.    From last appointment: - Add to the list of activities you can do that will be useful at your parents', for them or, with them.  For example:             - [place] for them.              - Spend time talking with your mom or just hanging out on the porch.              - Prepare breakfast and lunch.             - Take your  Mom to get her hair done (once a week).              - Take your  Mom on a drive a couple times a week.   - At the end of each day, write down what you accomplished that day.  Include EVERYTHING you did for your parents as well as anything else accomplished.    - Add to the list of activities you would like to accomplish for YOU.  For example:              - Give [name] a bath.              - Paint the spare bedroom.               - Go for a walk; walk [name].  Follow-up in-office appt on May 17 at 10:30 AM.

## 2019-06-14 NOTE — Progress Notes (Signed)
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  Still suffering from chronic insomnia as well.   Assessment: Becky [name] has agreed with her therapist to not go to her parents 3 days a week (when her sister Becky [name] is there).  Becky [name] did not remember recommendation from last appt to list activities she has done for her parents, and activities she would like to do for herself. Her depression has worsened, making eating even more challenging.  She said her sister is usually buying groceries for her parents now, and there is seldom consideration for foods Becky [name] prefers despite her being there for 3 meals/day several days a week.  For example, she is under the impression that yogurt in the refrigerator is only for her parents.  Therapist Rea [employer] has requested that Becky [name] be seen for MNT twice monthly, which we will try to make happen.  She will be seeing Becky [name] weekly at least for now.   Weight: 121.4 lb, stable.   Eating Pattern: 0-2 small meals/day; occasionally meeting goal of 2 meals and 2 snacks.   Sleep: Still 4-5 hrs sleep/night.   24-hr recall suggests intake of ~1100 kcal:  (Up at ? AM) B ( AM)-  --- Snk ( AM)-  --- L (12 PM)-  5-6 large hush puppies, 1 c potato salad, water 750 Snk ( PM)-  --- D (5 PM)-  1 c potato salad, water    350 Snk ( PM)-  --- Typical day? Yes.  Usually has salad; having soy burger ~2 X wk, cheese ~1 X wk.   Intervention: Reviewed recent diet history, and explored barriers to eating more.     For recommendations and  goals, see Patient Instructions.    Follow-up: 2 weeks; May 17 at 10:30.  [name] SYKES

## 2019-06-28 ENCOUNTER — Other Ambulatory Visit: Payer: Self-pay

## 2019-06-28 ENCOUNTER — Ambulatory Visit (INDEPENDENT_AMBULATORY_CARE_PROVIDER_SITE_OTHER): Payer: Medicare Other | Admitting: Family Medicine

## 2019-06-28 ENCOUNTER — Encounter: Payer: Self-pay | Admitting: Family Medicine

## 2019-06-28 DIAGNOSIS — F5000 Anorexia nervosa, unspecified: Secondary | ICD-10-CM

## 2019-06-28 NOTE — Progress Notes (Signed)
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  Still suffering from chronic insomnia as well.   Assessment: Sutter has started eating snacks in addn to getting 2 meals daily, although meals are still small.  She has managed to NOT go to her parents' on Thursday through Saturday.  She did not make a list of what she accomplishes at her parents', nor has she made a list of things she'd like to do for herself.  She has walked her dog ~15 min 3-4 X wk, which is the first time in quite a while that she has walked outside with any regularity.   Weight: 120.4 lb, down 1 lb in 2 weeks.   Eating Pattern: 0-2 small meals/day; occasionally meeting goal of 2 meals and 2 snacks.   Sleep: Slightly better; stopped Lunesta, and started taking Seroquil ~May 3.  Also discontinued Topamax and bennzatropine, and is tapering off Geodon.  Currently getting ~5 hrs sleep/night.   24-hr recall:  (Up at 5 AM) B (10 AM)-  3/4 c Special K Protein, 1/2 c 2% milk, water Snk (11 AM)-  water L (12 PM)-  8 oz o.j.  Snk (2 PM)-  water D (5:45 PM)-  1 Beyond Meat burger on bun, salad, 1 oz chs, (no drsng), water Snk (7:30)-  1 banana, water Typical day? Yes.     Intervention: Reviewed recent diet history, and explored barriers to eating more.     For recommendations and goals, see Patient Instructions.    Follow-up: 3 weeks.  Becky [name]

## 2019-06-28 NOTE — Patient Instructions (Addendum)
Goals:  1. Include at least one Orgain protein drink per day.  Use 1/2 dose per 12 to 16 oz of milk.   2. Eat at least 2 real meals and 2 snacks daily.  (Real means it includes a protein food, some starch, and some vegetables and/or fruit.)      If you have cereal for breakfast, make it at least 1 1/2 cups with at least 1 full cup of milk.    Make a list of activities you would like to accomplish for YOU. For example:  - Give [name] a bath.  - Paint the spare bedroom.  - Go for a walk; walk [name].  Document in a notebook what you accomplished at your parents' at the end of each day, e.g.,  - Checked in on your dad; asked if he has eaten, if he needs anything.   - Cleaned up kitchen.  - Fold up your mom's blanket, and generally picked up.   - Got your mom up; gave her medications, and coffee and cereal.   - Gave your dad his medications.   - Sat with your mom while she ate. - Made lunch for your dad, and took it to him.   - Cleaned up from breakfast and lunch.   - Watched TV with your mom (kept her company while watching movie).  - Took [name] out (several times).  - ....  Follow-up: In-office appt on Monday, June 7 at 11 AM.

## 2019-07-19 ENCOUNTER — Ambulatory Visit (INDEPENDENT_AMBULATORY_CARE_PROVIDER_SITE_OTHER): Payer: Medicare Other | Admitting: Family Medicine

## 2019-07-19 ENCOUNTER — Other Ambulatory Visit: Payer: Self-pay

## 2019-07-19 DIAGNOSIS — F5001 Anorexia nervosa, restricting type: Secondary | ICD-10-CM | POA: Diagnosis not present

## 2019-07-19 NOTE — Progress Notes (Signed)
Medical Nutrition Therapy PCP: Kathryne Eriksson, MD Therapist: [employer]a [employer], APRN, MSN, CS  Psychiatrist: [name], MD  Session Time:  1000-1030 (30 minutes)  Reason for visit: Follow-up of Medical Nutrition Therapy for anorexia nervosa, restricting type (F50.01)  Relevant history/background: Becky [name] continues to have anxiety about her weight, and although she establishes goals to nourish herself better and to exercise, she has been eating inadequately for a very long time now.  Still suffering from chronic insomnia as well.   Assessment: Becky [name] has documented progress on goals, but did not remember to bring her Goals Sheet.  She said she is snacking more often, but weight check reveals a 4-lb loss.  She tried the Orgain protein supplement, but got a stomach ache and diarrhea, so has not continued using it.   Weight: 116.4 lb, a loss of 4 lb in 2 weeks.   Eating Pattern: 1-3 small meals and 0-3 snacks/day.  Physical activity: Walking her dog 5-10 minutes 2 X day.   Sleep: Currently getting ~5 hrs sleep/night.  Completely off Topomax, benztropine, and Geodon now.  She thinks her current lack of interest in anything is due to Seroquel, which she started in early May.  Has appt with Dr. Albertine Patricia tomorrow to discuss this.   24-hr recall:  (Up at 5:30 AM) B (9 AM)-  3/4 c Raisin Bran, 1/2 c 2% milk, water Snk ( AM)-  water L (1:30 PM)-  1 toasted chs sandwich, water Snk ( PM)-  water D (6:30 PM)-  1 Beyond cheeseburger on bun, small salad, water Snk ( PM)-  water Typical day? Yes.    Intervention: Reviewed recent diet history, and re-emphasized paying attn to all recommendations provided at appts.       For recommendations and goals, see Patient Instructions.    Follow-up: 2 weeks.  [name] Kamuela Magos

## 2019-07-19 NOTE — Patient Instructions (Addendum)
-   When you have cereal for breakfast, make sure it is at least 1 1/2 cups of cereal and a full 8 oz of milk.    Goals:  1. Eat at least 3 real meals and 1-2 snacks per day.  Eat within the first hour of getting up.    2. Walk at least 20 minutes 4 times a week.    3. Document in a notebook what you accomplished at your parents' at the end of each day, e.g.,  - Checked in on your dad; asked if he has eaten, if he needs anything.   - Cleaned up kitchen.  - Fold up your mom's blanket, and generally picked up.   - Got your mom up; gave her medications, and coffee and cereal.   - Gave your dad his medications.   - Sat with your mom while she ate. - Made lunch for your dad, and took it to him.   - Cleaned up from breakfast and lunch.   - Watched TV with your mom (kept her company while watching movie).  - Took [name] out (several times).  - ....  Clemetine Marker your progress on goals at the end of each week, and report on [email or web address]    RE-READ THESE RECOMMENDATIONS AT THE END OF THE DAY EACH TIME YOU FILL OUT YOUR GOALS SHEET.  Are you following through on each?  Follow-up in-office appt on Monday, June 21 at 2:30.

## 2019-08-02 ENCOUNTER — Ambulatory Visit (INDEPENDENT_AMBULATORY_CARE_PROVIDER_SITE_OTHER): Payer: Medicare Other | Admitting: Family Medicine

## 2019-08-02 ENCOUNTER — Other Ambulatory Visit: Payer: Self-pay

## 2019-08-02 DIAGNOSIS — F5001 Anorexia nervosa, restricting type: Secondary | ICD-10-CM

## 2019-08-02 DIAGNOSIS — F50019 Anorexia nervosa, restricting type, unspecified: Secondary | ICD-10-CM

## 2019-08-02 NOTE — Progress Notes (Signed)
Medical Nutrition Therapy PCP: Kathryne Eriksson, MD Therapist: [employer]a [employer], APRN, MSN, CS  Psychiatrist: [name], MD  Session Time:  1430-1500 (30 minutes)  Reason for visit: Follow-up of Medical Nutrition Therapy for anorexia nervosa, restricting type (F50.01)  Relevant history/background: Becky [name] continues to have anxiety about her weight, and although she establishes goals to nourish herself better and to exercise, she has been eating inadequately for a very long time now.  Still suffering from chronic insomnia as well.   Assessment: Becky [name] had appt with Dr. Albertine Patricia a couple weeks ago, who prescribed Latuda for mood stabilization; new dx of Bipolar II.  (Completely off Topomax, benztropine, and Geodon.)  Becky [name] did not send any progress reports on [email or web address] but she did document progress on her Goals Sheet.  She went on a 2-day camping trip with her sister and friends where she ate and slept better.   Weight: 121.4 lb, up 5 lb in 2 weeks.   Eating Pattern: 1-3 small meals and 1-3 snacks/day.  Physical activity: Walking her dog 5-10 minutes 2 X day, but no other walking.  Has joined [employer], and plans to start working out there with her sister this Thursday.   Sleep: Estimates she is getting 4-5 hrs sleep/night.   No Food Recall today b/c yesterday was a completely abnormal day.    Intervention: Reviewed recent diet history, and re-emphasized value of documenting progress on behavioral goals.     For recommendations and goals, see Patient Instructions.    Follow-up: 2 weeks.  Becky [name]

## 2019-08-02 NOTE — Patient Instructions (Addendum)
Goals:  1. Eat at least 3 real meals and 1-2 snacks per day.  Eat within the first hour of getting up.    - Make your own lunch a priority.  You can make your lunch when you are making your parents'.    2. Work out at [employer] at least 30 minutes 3 X wk.    - Go with Lenard Forth for the next two weeks, then go by yourself starting the week of July 5th.     - BONUS points:  Walk at least 20 minutes 1 additional time per  week.    3. Document your progress on the above goals using your Goals Sheet, AND report each week on [email or web address]    Follow-up in-office appt on Tues, July 6 at 10:30.  I will call you if there is a cancellation for an appt later in the month.

## 2019-08-05 ENCOUNTER — Encounter: Payer: Self-pay | Admitting: Neurology

## 2019-08-05 ENCOUNTER — Other Ambulatory Visit: Payer: Self-pay

## 2019-08-05 ENCOUNTER — Ambulatory Visit (INDEPENDENT_AMBULATORY_CARE_PROVIDER_SITE_OTHER): Payer: Medicare Other | Admitting: Neurology

## 2019-08-05 VITALS — BP 105/77 | HR 57 | Wt 124.0 lb

## 2019-08-05 DIAGNOSIS — R634 Abnormal weight loss: Secondary | ICD-10-CM

## 2019-08-05 DIAGNOSIS — G3184 Mild cognitive impairment, so stated: Secondary | ICD-10-CM | POA: Insufficient documentation

## 2019-08-05 DIAGNOSIS — E538 Deficiency of other specified B group vitamins: Secondary | ICD-10-CM

## 2019-08-05 DIAGNOSIS — F5104 Psychophysiologic insomnia: Secondary | ICD-10-CM | POA: Diagnosis not present

## 2019-08-05 HISTORY — DX: Psychophysiologic insomnia: F51.04
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
## 2019-08-09 ENCOUNTER — Other Ambulatory Visit: Payer: Self-pay | Admitting: *Deleted

## 2019-08-09 LAB — VITAMIN B1: Thiamine: 131.7 nmol/L (ref 66.5–200.0)

## 2019-08-09 LAB — COPPER, SERUM: Copper: 120 ug/dL (ref 80–158)

## 2019-08-09 LAB — VITAMIN B12: Vitamin B-12: 1560 pg/mL — ABNORMAL HIGH (ref 232–1245)

## 2019-08-17 ENCOUNTER — Encounter: Payer: Self-pay | Admitting: Family Medicine

## 2019-08-17 ENCOUNTER — Telehealth: Payer: Self-pay | Admitting: Family Medicine

## 2019-08-17 ENCOUNTER — Other Ambulatory Visit: Payer: Self-pay

## 2019-08-17 ENCOUNTER — Ambulatory Visit (INDEPENDENT_AMBULATORY_CARE_PROVIDER_SITE_OTHER): Payer: Medicare Other | Admitting: Family Medicine

## 2019-08-17 DIAGNOSIS — F5001 Anorexia nervosa, restricting type: Secondary | ICD-10-CM | POA: Diagnosis not present
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
## 2019-08-18 ENCOUNTER — Ambulatory Visit (INDEPENDENT_AMBULATORY_CARE_PROVIDER_SITE_OTHER): Payer: Medicare Other | Admitting: Family Medicine

## 2019-08-18 VITALS — BP 100/60 | Ht 60.5 in | Wt 116.0 lb

## 2019-08-18 DIAGNOSIS — M25562 Pain in left knee: Secondary | ICD-10-CM

## 2019-08-18 DIAGNOSIS — M79661 Pain in right lower leg: Secondary | ICD-10-CM | POA: Diagnosis not present

## 2019-08-18 DIAGNOSIS — M79674 Pain in right toe(s): Secondary | ICD-10-CM

## 2019-08-18 DIAGNOSIS — G8929 Other chronic pain: Secondary | ICD-10-CM | POA: Diagnosis not present

## 2019-08-18 NOTE — Patient Instructions (Signed)
For your Knee: there are multiple issues causing your knee pain. The fall likely caused a bone bruise, which will take some time to heal on it's own. You also have a hamstring strain/tendinitis, likely due to the walking routine you have started. Please scale back on the amount you are walking and progress slowly. You may also take tylenol and ibuprofen for pain.  For your shin: this is alo likely due to the new walking routine. As above, please rest and then build up your walking more slowly.  For your toes: Try ordering a gel toe spacer and seeing if that helps relieve your pain. If this is not helpful, we may need to consider revisiting a Foot & Ankle doctor for further evaluation.  Please plan to follow up here in 4 weeks to reevaluate.
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
## 2019-08-19 ENCOUNTER — Encounter: Payer: Self-pay | Admitting: Family Medicine

## 2019-09-15 ENCOUNTER — Ambulatory Visit: Payer: Medicare Other | Admitting: Family Medicine

## 2019-09-20 ENCOUNTER — Other Ambulatory Visit: Payer: Self-pay

## 2019-09-20 ENCOUNTER — Ambulatory Visit (INDEPENDENT_AMBULATORY_CARE_PROVIDER_SITE_OTHER): Payer: Medicare Other | Admitting: Family Medicine

## 2019-09-20 ENCOUNTER — Encounter: Payer: Self-pay | Admitting: Family Medicine

## 2019-09-20 VITALS — BP 106/70

## 2019-09-20 DIAGNOSIS — G8929 Other chronic pain: Secondary | ICD-10-CM | POA: Diagnosis not present

## 2019-09-20 DIAGNOSIS — M79674 Pain in right toe(s): Secondary | ICD-10-CM | POA: Diagnosis not present

## 2019-09-20 DIAGNOSIS — M25562 Pain in left knee: Secondary | ICD-10-CM | POA: Diagnosis not present

## 2019-09-20 NOTE — Patient Instructions (Signed)
Try metatarsal pad on the right side to see if this provides you relief. Stop the spacer if this feels worse while having this in there. Tylenol, icing if needed. Follow up with me in 1 month for reevaluation.
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
## 2019-10-25 ENCOUNTER — Ambulatory Visit: Payer: Medicare Other | Admitting: Family Medicine

## 2019-12-13 ENCOUNTER — Ambulatory Visit (INDEPENDENT_AMBULATORY_CARE_PROVIDER_SITE_OTHER): Payer: Medicare Other | Admitting: Family Medicine

## 2019-12-13 ENCOUNTER — Other Ambulatory Visit: Payer: Self-pay

## 2019-12-13 DIAGNOSIS — F5001 Anorexia nervosa, restricting type: Secondary | ICD-10-CM
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
## 2019-12-13 NOTE — Patient Instructions (Addendum)
Grocery shop TODAY, getting at least the following:   Bread Yogurt Milk Cereal Fruit  Goal:  1. Eat at least 4 times a day.  Three of those times need to be actual meals, which include a source of protein, a starch, and fruit and/or vegetable.    3.Document each time you eat, including notation when it is a REAL meal, AND report daily or weekly on [email or web address].[email or web address]   F/U video visit on Monday, Dec 6 at 2:30 PM.  I will email a link to you.  Let me know that morning if you prefer a text (if you use your phone instead of laptop).

## 2020-01-17 ENCOUNTER — Ambulatory Visit (INDEPENDENT_AMBULATORY_CARE_PROVIDER_SITE_OTHER): Payer: Medicare Other | Admitting: Family Medicine

## 2020-01-17 ENCOUNTER — Other Ambulatory Visit: Payer: Self-pay

## 2020-01-17 DIAGNOSIS — F5001 Anorexia nervosa, restricting type: Secondary | ICD-10-CM

## 2020-01-17 DIAGNOSIS — F50019 Anorexia nervosa, restricting type, unspecified: Secondary | ICD-10-CM

## 2020-01-17 NOTE — Patient Instructions (Addendum)
-   Consider seeing a sports med physician for your neck pain (and possible referral to PT).    - Talk with Becky [name] about your night-time anxiety that is preventing sleep.   (Please also share with her that you are getting only about 500-600 calories a day, according to [facility].)  You are in the driver's seat with respect to your health care.  You and only you make food and eating decisions, but if you decide you are ready to get back to work on improving your nutrition, below are my suggestions for doing so:   1. Eat at least 4 times a day, relatively evenly spaced throughout the day, and starting within the first hour of getting up.   2. Commit to obtaining what looks to you like an actual meal rather than a snack at least 3 times a day (a protein food, some starch, and fruit and/or veg). 3. Drink enough fluids not just to satisfy your thirst, but to keep your urine light in color, probably at least 48 to 60 ounces per day.    You stated today that you don't have the motivation to take [name] out for a walk.  Of course you don't!  You are neither sleeping nor eating adequately, so energy reserves are depleted.  In fact, low blood sugar levels may be contributing to your insomnia.  So - if you cannot make yourself eat in your own best interest, maybe you can do so to make [name] happy with some walks.    Also keep in mind that while malnutrition did not cause your depression, it is likely contributing to it; depression and feeling melancholy are classic symptoms of starvation or semi-starvation (along with fatigue, lethargy, and numerous other health problems).    Follow-up video appt via MyChart on Monday, January 10 at 11 AM.
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
## 2020-01-27 ENCOUNTER — Ambulatory Visit: Payer: Medicare Other | Admitting: Neurology

## 2020-02-21 ENCOUNTER — Other Ambulatory Visit: Payer: Self-pay

## 2020-02-21 ENCOUNTER — Ambulatory Visit (INDEPENDENT_AMBULATORY_CARE_PROVIDER_SITE_OTHER): Payer: Medicare Other | Admitting: Family Medicine

## 2020-02-21 DIAGNOSIS — F5001 Anorexia nervosa, restricting type: Secondary | ICD-10-CM | POA: Diagnosis not present

## 2020-02-21 DIAGNOSIS — F50019 Anorexia nervosa, restricting type, unspecified: Secondary | ICD-10-CM

## 2020-02-21 NOTE — Patient Instructions (Signed)
-   At your appt with Dr. Albertine Patricia next week, make sure you tell  that your calorie intake has been limited to 500-600 per day.  Such an inadequate intake likely makes many medicines less effective.   When you shop today, get an extra box of cereal and some yogurt to take to your parents' house tomorrow.   Goals:  1. Eat at least 4 times a day, relatively evenly spaced throughout the day, and starting within the first hour of getting up.     When at your parents', set aside a few minutes in the morning (~10:00) to eat by yourself.  Bring some cereal you like and some yogurt to your parents' for a back-up meal option.  (Or also bring some protein drinks, in case you want to have cereal at home, and protein drink for a snack.)   When home or away, set a phone alarm to remind you to eat: 10 AM at parents and again 2 PM for when you get home.  Keep the same schedule of eating on days you don't go to your parents.   2. Obtain an actual meal rather than a snack at least 2 times a day (which includes a protein food, some starch, and fruit and/or veg). 3. Drink at least 48 to 60 ounces of fluids per day.    Follow-up video appt via MyChart on Thursday, Feb 10 at 1:30 PM.

## 2020-02-21 NOTE — Progress Notes (Signed)
Telehealth Encounter (Email [email or web address] ) PCP: Kathryne Eriksson, MD Therapist: Rea [employer], APRN, MSN, CS  Psychiatrist: [name], MD Telehealth Encounter I connected with KANG ISHIDA (MRN [id number]) on 02/21/2020 by MyChart video-enabled, HIPAA-compliant telemedicine application, verified that I was speaking with the correct person using two identifiers, and that the patient was in a private environment conducive to confidentiality.  The patient agreed to proceed.  Persons participating in visit were patient and provider (registered dietitian) [name], PhD, RD, LDN, CEDRD.  Provider was located at home during this telehealth encounter; patient was at home.  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  Relevant history/background: Chyann continues to have anxiety about her weight, and although she establishes goals to nourish herself better and to exercise, she has been eating inadequately for a very long time now.  Still suffering from chronic insomnia as well.   Assessment: At last appt, Audriana was reluctant to commit to dietary recommendations because she feels unable to make herself eat most of the time.  She still feels ambivalent about this.  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  Anxiety is high while there, so it is especially difficult to eat, although she is not eating at midday at home, either.  Weight: Not checked since July.  Usual eating pattern: 1 small meal and 1-2 snacks/day.  Physical activity: None currently.  Sleep: Estimates 4-5 hrs sleep/night.  Bedtime usually ~11 PM or 12 AM.  Feels wide awake even though she is tired.   24-hr recall suggests intake of ~625 kcal:  (Up at 7 AM) B (7 AM)-  11 oz Pure Protein protein drink (30 g pro, 140 kcal) Snk ( AM)-  Powerade  Zero L ( PM)-  --- Snk ( PM)-  --- D (6:30 PM)- 2 tbsp dry air-popped popcorn, Powerade Zero  135 Snk (8 PM)-  1 soy burger on bun, 1 slc cheese   350 Drank 48 oz Powerade Zero.  Typical day? Yes.   Has started having a protein drink in the morning upon therapist's urging.    Intervention: Completed recent diet and exercise history, and reviewed the body's priority for energy over protein.    For recommendations and goals, see Patient Instructions.    Follow-up: 1 month.   Emilyann Banka,[name]

## 2020-03-23 ENCOUNTER — Ambulatory Visit (INDEPENDENT_AMBULATORY_CARE_PROVIDER_SITE_OTHER): Payer: Medicare Other | Admitting: Family Medicine

## 2020-03-23 DIAGNOSIS — F5001 Anorexia nervosa, restricting type: Secondary | ICD-10-CM

## 2020-03-23 NOTE — Progress Notes (Signed)
Telehealth Encounter (Email link to [email or web address] ) PCP: Kathryne Eriksson, MD Therapist: Rea [employer], APRN, MSN, CS  Psychiatrist: [name], MD Telehealth Encounter I connected with NAMIKO PRITTS (MRN [id number]) on 03/23/2020 by MyChart video-enabled, HIPAA-compliant telemedicine application, verified that I was speaking with the correct person using two identifiers, and that the patient was in a private environment conducive to confidentiality.  The patient agreed to proceed.  Persons participating in visit were patient and provider (registered dietitian) [name], PhD, RD, LDN, CEDRD.  Provider was located at home during this telehealth encounter; patient was at home.  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  Relevant history/background: Rosell continues to have anxiety about her weight, and although she establishes goals to nourish herself better and to exercise, she has been eating inadequately for a very long time now.  Still suffering from chronic insomnia as well.    Assessment: Kiona has started recording food intake and counting kcal.  This has made her hyper-aware of kcal contents of foods, which has prompted her to seek foods lower in kcal, but she finds it impossible to stop the focus on kcal.  She has been getting about 600 kcal per day.  She has been caring for her parents daily, 9 AM to 2 PM on 3 MWF, and 2-4 hrs/day other days.  Is very busy while there, and does not want to/is unable to stop for lunch on those days.   Appt with psychiatrist Dr. Albertine Patricia was cancelled, so Alivea has not spoken with her since December, and is now scheduled for F/U on 03/28/20.   Weight: Not checked since July.   Usual eating pattern: 1 small "meal" and 1-2 snacks/day.  Rarely getting fruit or veg's, e.g., remembered having an orange about 2 wks  ago.   Physical activity: None currently.  Sleep: Estimates 4-5 hrs sleep/night.  Bedtime usually ~11 PM or 12 AM.  Often feels wide awake even though she is tired.   24-hr recall suggests intake of 600-700 kcal:  (Up at 7 AM; drank some water) B (7:30 AM)-  11 oz Ensure Max protein drink (150 kcal, 30 g protein)  Snk (9 AM)-  water L (12 PM)-  2 cc cookies, water       100 Snk ( PM)-  water D (7:30 PM)-  1 soy cheeseburger on bun, water <400 Snk ( PM)-   Typical day? Yes.    Pretty normal, except rarely eats cookies.    Previous kcal intake estimates: 02/21/20 625 01/17/20 570 12/13/19 500  Intervention: Completed recent diet and exercise history, and discussed pt's current obsession with kcal counting.    For recommendations and goals, see Patient Instructions.    Follow-up: 1 month.   Ceci Taliaferro,[name]

## 2020-03-23 NOTE — Patient Instructions (Addendum)
Counting calories only serves to help you further restrict your intake.  More calories (more food) is what you need most right now, not less.  Calories give you NO information about the nutritional quality of the food, but are simply a measure of energy.  In addn, the numbers shown on food labels are - by law - plus or minus 20% accurate. In practice, they are often  even less accurate than that. So besides driving yourself crazy with tracking them, calories don't help you make good food choices, and are probably frequently misleading.   You said you know you need to eat better than you are doing now.  "Better" means getting more food.  This is your #1 nutritional priority right now.    As discussed today, a long period of food restriction leads to a reduced ability to handle a normal amount of food.  One problem with this is that we all have an absolute need for a minimum amount of food (and energy) to keep the body functioning.  And - to start getting back to normal digestion requires eating more even when it feels uncomfortable to do so.    Shop for food tomorrow: Protein drinks (remember, you'll need 14 per week) Veggie burgers Yogurt  Bread Fruit    Goals:  1. Eat at least 4 times a day, relatively evenly spaced throughout the day, and starting within the first hour of getting up.    - Start your day with one protein drink + fruit for breakfast, and add a second protein drink + fruit at noon.      When home or away, set a phone alarm to remind you to eat: 10 AM at parents and again 2 PM for when you get home.  Keep the same schedule of eating on days you don't go to your parents.   2. Compromise on definition of a "real meal":  For at least 2 of your 4 times eating per day, include at least a meaningful protein source plus a piece of fruit or a carb food, e.g., bread, cereal, crackers, cookies.   3. STOP recording food intake.  Send an RR.com message every Sunday to let me know how many days  you did NOT record food intake during the past week.    Great job getting back to a good water intake!  Follow-up video appt via MyChart on Thursday, March 10 at 11 AM.

## 2020-04-19 NOTE — Progress Notes (Signed)
Telehealth Encounter (Email link to [email or web address] ) PCP: Kathryne Eriksson, MD Therapist: Rea [employer], APRN, MSN, CS  Psychiatrist: [name], MD Telehealth Encounter I connected with Becky [name] (MRN [id number]) on 04/20/2020 by MyChart video-enabled, HIPAA-compliant telemedicine application, verified that I was speaking with the correct person using two identifiers, and that the patient was in a private environment conducive to confidentiality.  The patient agreed to proceed.  Persons participating in visit were patient and provider (registered dietitian) [name], PhD, RD, LDN, CEDRD.  Provider was located at [place] during this telehealth encounter; patient was at home.  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  Relevant history/background: Xochilt continues to have anxiety about her weight, and although she establishes goals to nourish herself better and to exercise, she has been eating inadequately for a very long time now.  Still suffering from chronic insomnia as well.    Assessment: Kamorie has not tracked kcal intake since Feb 9, but she says it is a daily struggle for her to avoid doing so.  She continues to care for her parents, whose needs have increased.  She goes to their house at 9 AM on MTW, often staying till late afternoon, and 2-4 hrs/day on other days of the week.  Feeling anxious and depressed most of the time.  Stress has increased as parents' health has continued to decline, and finances do not allow getting residential or nursing home care for them.   Weight: Not checked since July.   Usual eating pattern: 1 small "meal" and 1-3 snacks/day.     Physical activity: None currently.  Sleep: Estimates 4-5 hrs sleep/night.  24-hr recall suggests intake of ~700 kcal:  (Up at 7 AM) B (7:15 AM)-  11 oz  Ensure high-protein drink  (30 g pro, 150 kcal), water Snk (10 AM)-  1 orange L (2 PM)-  11 oz Ensure high-protein drink  (30 g pro, 150 kcal), water Snk ( PM)-  --- D (8 PM)-  1 soy burger on bun, 1 slc cheese, water Snk ( PM)-  --- Typical day? Yes.     Previous kcal intake estimates: 03/23/20:           650 02/21/20 625 01/17/20 570 12/13/19 500  Intervention: Reviewed recent diet and exercise history, and discussed need for more energy intake, trying to help pt understand why her restriction is counterproductive to her goals to manage her weight and health.    For recommendations and goals, see Patient Instructions.    Follow-up: 2 months.   Xzavior Reinig,[name]

## 2020-04-20 ENCOUNTER — Other Ambulatory Visit: Payer: Self-pay

## 2020-04-20 ENCOUNTER — Ambulatory Visit (INDEPENDENT_AMBULATORY_CARE_PROVIDER_SITE_OTHER): Payer: Medicare Other | Admitting: Family Medicine

## 2020-04-20 DIAGNOSIS — F5001 Anorexia nervosa, restricting type: Secondary | ICD-10-CM

## 2020-04-20 DIAGNOSIS — F50019 Anorexia nervosa, restricting type, unspecified: Secondary | ICD-10-CM

## 2020-04-20 NOTE — Patient Instructions (Addendum)
Ensure drinks:  Switch to Ensure Plus, especially if you are using it as a meal replacement.  (Order through [employer].)  This does not pass the Guest Test.    NOTE: As long as you are in a negative energy balance (burning more calories than you are eating), it will be impossible for your body to build or even maintain muscle.  Muscle is the body's most active tissue in terms of burning calories.  In addition, when you are consuming too little energy, there's no motivation (or even ability) to exercise.    Depression and anxiety are usually exacerbated by malnutrition (low blood sugar).    Upshot of above FACTS:  If you want to feel and function better, there is no way around the fact that you need to nourish yourself adequately.    Examples of meals that meet the minimum requirements:  Breakfast:  2 toast with peanut butter, 1 Chobani yogurt Lunch:  Cheese sandwich, fruit and/or vegetables Dinner:  1 soy cheeseburger on bun, salad with dressing Snack options:  Any fruit, string cheese, yogurt, 1/4 c nuts, 1/2 peanut butter sandwich (>1 tbsp peanut butter), cereal with yogurt, crackers with cheese or peanut butter, 1 Ensure.    Meals need to pass the GUEST TEST:  Would you serve this to a guest in your home and call it a meal?  Goals remain the same: 1. Eat at least 4 times a day, relatively evenly spaced throughout the day, and starting within the first hour of getting up.   - Start your day with one protein drink + fruit for breakfast, and add a second protein drink + fruit at noon.   When home or away, set a phone alarm to remind you to eat: 10 AM at parents and again 2 PM for when you get home. Keep the same schedule of eating on days you don't go to your parents.  2.Compromise on definition of a "real meal":  For at least 2 of your 4 times eating per day, include at least a meaningful protein source plus a piece of fruit or a carb food, e.g., bread, cereal, crackers, cookies.     Email updates at least every 3 weeks before your next appt.  Follow-up in-office visit on Thursday, May 19 at 1:30 PM.

## 2020-06-29 ENCOUNTER — Ambulatory Visit (INDEPENDENT_AMBULATORY_CARE_PROVIDER_SITE_OTHER): Payer: Medicare Other | Admitting: Family Medicine

## 2020-06-29 ENCOUNTER — Other Ambulatory Visit: Payer: Self-pay

## 2020-06-29 DIAGNOSIS — F5001 Anorexia nervosa, restricting type: Secondary | ICD-10-CM

## 2020-06-29 DIAGNOSIS — Z713 Dietary counseling and surveillance: Secondary | ICD-10-CM

## 2020-06-29 NOTE — Progress Notes (Signed)
Telehealth Encounter (Email link to [email or web address] ) PCP: Kathryne Eriksson, MD Therapist: Rea [employer], APRN, MSN, CS  Psychiatrist: [name], MD Telehealth Encounter I connected with Becky [name] (MRN [id number]) on 06/29/2020 by MyChart video-enabled, HIPAA-compliant telemedicine application, verified that I was speaking with the correct person using two identifiers, and that the patient was in a private environment conducive to confidentiality.  The patient agreed to proceed.  Persons participating in visit were patient and provider (registered dietitian) [name], PhD, RD, LDN, CEDRD.  Provider was located at [place] during this telehealth encounter; patient was at home.  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  Relevant history/background: Becky [name] continues to have anxiety about her weight, and although she establishes goals to nourish herself better and to exercise, she has been eating inadequately for a very long time now.  Still suffering from chronic insomnia as well.    Assessment: Becky [name] did not email any progress reports as requested at last appt (04/21/20).  She has avoided tracking kcal intake since Feb 9.  Stress continues unabated, related to parents' declining health and financial concerns.  She is at her parents' house several hours on MTW, and 2-4 hrs/day on other days of the week.  She tried Ensure Plus, but found it to be too rich, so she is now consuming a regular Ensure daily.  She feels she has been eating more, and is distressed at a perceived weight gain.  Becky [name] has experienced dizziness (and sporadic vertigo) as well as poor short-term memory and poor focus, which may be malnutrition-related.   Weight: Not checked since July.   Usual eating pattern: 2 small "meal" and 1 snack/day. Physical  activity: None currently.  Sleep: Estimates 5 hrs sleep/night. Now taking Lunesta nightly and 60 mg Latuda, which causes tremors.  Recently started 0.75 mg benztropine (will increase to 1 mg), which has helped tremors.   24-hr recall suggests intake of 850 kcal:  (Up at 6 AM) B (6:30 AM)-  5 oz Chobani plain yogurt (220 kcal), 11 oz Ensure (150 kcal) Snk ( AM)-   L ( PM)-  water Snk ( PM)-   D (8 PM)-  1 soy burger on bun (390 kcal), 1 slc cheese (90 kcal) , water Snk ( PM)-   Typical day? Yes.   Getting 48-60 oz of water.  Very busy yesterday, so dinner was later than usual.    Previous kcal intake estimates: 04/20/20:  700 03/23/20:           650 02/21/20 625 01/17/20 570 12/13/19 500  Intervention: Reviewed recent diet and exercise history, and modified goals.  Provided a form describing how to check for orthostatic hypotension.    For recommendations and goals, see Patient Instructions.    Follow-up: 5 weeks.   Becky [name],Becky [name]

## 2020-06-29 NOTE — Patient Instructions (Addendum)
Examples of meals that meet the minimum requirements: Breakfast:        2 toast with peanut butter, 1 Chobani yogurt Lunch:             Cheese sandwich, fruit and/or vegetables Dinner:            1 soy cheeseburger on bun, salad or cooked veg (from frozen) Snack options:  Any fruit, string cheese, yogurt, 1/4 c nuts, 1/2 peanut butter sandwich (>1 tbsp peanut butter), cereal with yogurt, crackers with cheese or peanut butter, 1 Ensure.    Substitute some kind of real food for that morning Ensure.  For example, add to your yogurt fresh fruit, nuts, and/or cereal.  This way, you could use the Ensure for part of your lunch when you are busy at your parents'.     Goals: 1. Eat at least 4 times a day, relatively evenly spaced throughout the day, and starting within the first hour of getting up. - Breakfast:  Yogurt, cereal, nuts/seeds, fresh fruit.     - Lunch: Minimum of Ensure drink and fruit and/or a sandwich.   2.For at least 2 of your 4 times eating per day, include a protein source plus a piece of fruit or a carb food, e.g., bread, cereal, crackers, cookies.   Use the form provided today to check for orthostatic hypotension, which is a common symptom of malnutrition.    Follow-up video visit on Monday, June 20 at 11 AM.

## 2020-07-31 ENCOUNTER — Ambulatory Visit (INDEPENDENT_AMBULATORY_CARE_PROVIDER_SITE_OTHER): Payer: Medicare Other | Admitting: Family Medicine

## 2020-07-31 ENCOUNTER — Other Ambulatory Visit: Payer: Self-pay

## 2020-07-31 DIAGNOSIS — F5001 Anorexia nervosa, restricting type: Secondary | ICD-10-CM

## 2020-07-31 DIAGNOSIS — Z713 Dietary counseling and surveillance: Secondary | ICD-10-CM

## 2020-07-31 NOTE — Patient Instructions (Signed)
Try diluting Ensure Plus with a small amount of skim milk to make it more appealing to you.    Use the form provided today to check for orthostatic hypotension, which is a common symptom of malnutrition.  Let Edmonia Lynch know your results from a few days' measurements.    Whenever you shop for your parents, place a basket inside the cart for YOUR groceries.    Groceries today: (Prescription) Nuts and/or seeds Yogurt Milk Cereal  Crackers Fresh fruit Lettuce,  Carrots Bell peppers Grape tomatoes Frozen veg's  Examples of meals that meet the minimum requirements: Breakfast:        2 toast with peanut butter, 1 Chobani yogurt Lunch:             Cheese sandwich, fruit and/or vegetables Dinner:            1 soy cheeseburger on bun, salad or cooked veg (from frozen) Snack options:  Any fruit, string cheese, yogurt, 1/4 c nuts, 1/2 peanut butter sandwich (>1 tbsp peanut butter), cereal with yogurt, crackers with cheese or peanut butter, 1 Ensure.     Goals:   1. Eat at least 4 times a day, relatively evenly spaced throughout the day, and starting within the first hour of getting up.     - Breakfast:  Yogurt, cereal, nuts/seeds, fresh fruit.     - Lunch: Minimum of Ensure Plus and fruit or a full sandwich.and fruit    2. For at least 2 of your 4 times eating per day, include a protein source plus a piece of fruit or a carb food, e.g., bread, cereal, crackers, cookies.     Follow-up video visit on Tuesday, July 19 at 2:30 PM.

## 2020-07-31 NOTE — Progress Notes (Signed)
Telehealth Encounter (Email link to [email or web address] ) PCP: Kathryne Eriksson, MD Therapist: Rea College, APRN, MSN, CS  Psychiatrist: [name], MD Telehealth Encounter I connected with Becky [name] (MRN [id number]) on 07/31/2020 by MyChart video-enabled, HIPAA-compliant telemedicine application, verified that I was speaking with the correct person using two identifiers, and that the patient was in a private environment conducive to confidentiality.  The patient agreed to proceed.  Persons participating in visit were patient and provider (registered dietitian) [name], PhD, RD, LDN, CEDRD.  Provider was located at [place] during this telehealth encounter; patient was at home.  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  Relevant history/background: [name] continues to have anxiety about her weight, and although she establishes goals to nourish herself better and to exercise, she has been eating inadequately for a very long time now.  Still suffering from chronic insomnia as well.    Assessment: Little has changed with respect to demands of caring for her parents.  She is at her parents' every day except Sat and/or Sunday.  [name] said her eating has been inconsistent. During a camping trip with her sister and friends, [name] stumbled and fell from dizziness a number of times, from what was perceived to be dehydration.  It did not occur to her (or the three RNs in the group) that this could be related to malnutrition/acute hypoglycemia.   Weight: Not checked since July.   Usual eating pattern: 2 small "meals" and 1 snack/day.   Physical activity: None currently.  Sleep: Estimates 5 hrs sleep/night. To help with sleep, she is taking 3 mg eszopiclone (Lunesta) nightly and 60 mg lirasadone (Latuda), which causes tremors.   Taking 1 mg benztropine to manage tremors.   24-hr recall suggests intake of ~875 kcal:  (Up at 5:30 AM) B (6 AM)-  4 oz Ensure Plus, 5 oz Grk yogurt  365 Went to parents\' at 9 AM.   Snk ( AM)-  water L (1 PM)- 1 slc bread, 1 slc cheese, water  170 Snk ( PM)-  water D (7:15 PM)-  1 soy chsburger on bun, side salad, water 340 Snk ( PM)-  water Typical day? Yes.    Previous kcal intake estimates: 06/29/20:  850  04/20/20:  700 03/23/20:           65 0 02/21/20 625 01/17/20 570 12/13/19 500  Intervention: Reviewed recent diet and exercise history, and encouraged again to meet food goals, and to measure orthostatic BP.    For recommendations and goals, see Patient Instructions.    Follow-up: 4 weeks.   Adom Schoeneck,[name]

## 2020-08-29 ENCOUNTER — Other Ambulatory Visit: Payer: Self-pay

## 2020-08-29 ENCOUNTER — Ambulatory Visit (INDEPENDENT_AMBULATORY_CARE_PROVIDER_SITE_OTHER): Payer: Medicare Other | Admitting: Family Medicine

## 2020-08-29 DIAGNOSIS — Z713 Dietary counseling and surveillance: Secondary | ICD-10-CM

## 2020-08-29 DIAGNOSIS — F5001 Anorexia nervosa, restricting type: Secondary | ICD-10-CM | POA: Diagnosis not present

## 2020-08-29 DIAGNOSIS — F50019 Anorexia nervosa, restricting type, unspecified: Secondary | ICD-10-CM

## 2020-08-29 NOTE — Patient Instructions (Signed)
Get an appt with Dr. Redmond Pulling, and ask about your ongoing use of blood pressure meds (Bystolic & Lasix) in light of your recent usually low blood pressure.  Ask about taking B12 and iron.  Should you still be on these supplements?  (If you need to supplement, take iron on an empty stomach, and with at least 200 mg vitamin C.)  Use the form provided today to check for orthostatic hypotension, which is a common symptom of malnutrition.  Let Edmonia Lynch know your results from a few days' measurements.    Explore generic (cheaper) options for a nutrition drink.    Goals:   1. Eat at least 4 times a day, relatively evenly spaced throughout the day, and starting within the first hour of getting up.     - Breakfast:  Yogurt, cereal, nuts/seeds, fresh fruit.     - Lunch: Minimum of Ensure Plus and fruit or a full sandwich.and fruit    2. For at least 2 of your 4 times eating per day, include a protein source plus a piece of fruit or a carb food, e.g., bread, cereal, crackers, cookies.   3. Eat lunch whenever you prepare lunch for your parents.    Examples of meals that meet the minimum requirements: Breakfast:        2 toast with peanut butter, 1 Chobani yogurt Lunch:             Cheese sandwich, fruit and/or vegetables Dinner:            1 soy cheeseburger on bun, salad or cooked veg (from frozen) Snack options:  Any fruit, string cheese, yogurt, 1/4 c nuts, 1/2 peanut butter sandwich (>1 tbsp peanut butter), cereal with yogurt, crackers with cheese or peanut butter, 1 Ensure.    Follow-up video visit on Tuesday, August 16 at 11 AM.

## 2020-08-29 NOTE — Progress Notes (Signed)
Telehealth Encounter (Email link to [email or web address] ) PCP: Kathryne Eriksson, MD Therapist: Rea College, APRN, MSN, CS  Psychiatrist: [name], MD Telehealth Encounter I connected with Becky [name] (MRN [id number]) on 08/29/2020 by MyChart video-enabled, HIPAA-compliant telemedicine application, verified that I was speaking with the correct person using two identifiers, and that the patient was in a private environment conducive to confidentiality.  The patient agreed to proceed.  Persons participating in visit were patient and provider (registered dietitian) Becky Center, PhD, RD, LDN, CEDRD.  Provider was located at [place] during this telehealth encounter; patient was at home.  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  Relevant history/background: Becky [name] continues to have anxiety about her weight, and although she establishes goals to nourish herself better and to exercise, she has been eating inadequately for a very long time now.  Still suffering from chronic insomnia as well.    Assessment: [name] said she has eaten "a little better," but not consistently.  She still struggles with eating at all on most days.  She did not remember to email any orthostatic BP measurements, although she did measure BP a few times, and was unsure why she is still taking antihypertensives when her BP has been running low.   Weight: Not checked since July.   Usual eating pattern: 2 small "meals" and 1 snack/day.   Physical activity: None currently.  Sleep: Estimates 5 hrs sleep/night. Taking 3 mg eszopiclone (Lunesta) nightly and 60 mg lirasadone (Latuda), which causes tremors.  Taking 1 mg benztropine to manage tremors.   24-hr recall suggests intake of ~870 kcal:  (Up at 6:30 AM) B (7 AM)-  5 oz Oikos banana f-f Grk yogurt, 1/2 bagel,  1 tsp butter, 1 peach, 16 oz Propel water 360 Snk (11 AM)-  1 peach             60 L ( PM)-  16 oz Propel water drink Snk (3 PM)-  Oikos banana f-f Grk yogurt, 8 oz Propel water drink     100 D (7 PM)-  1 veggie burger, 1 slc chs, hamburger bun, 8 oz Propel water drink    350 Snk (10 PM)-  Propel water drink Typical day? Yes.   Although most days she eats a bit less food.  Doesn't usually have two yogurts/day, and doesn't usually have bagels.    Previous kcal intake estimates: 07/31/20: 875  06/29/20:  850  04/20/20:  700 03/23/20:           650 02/21/20 625 01/17/20 570 12/13/19 500  Intervention: Reviewed recent diet and exercise history, and again encouraged to meet food goals, and to measure (and report) orthostatic BP, as well as to f/u with PCP.    For recommendations and goals, see Patient Instructions.    Follow-up: 4 weeks.   Becky [name],Becky [name]

## 2020-09-26 ENCOUNTER — Telehealth: Payer: Medicare Other | Admitting: Family Medicine

## 2020-09-26 ENCOUNTER — Other Ambulatory Visit: Payer: Self-pay

## 2020-10-03 ENCOUNTER — Inpatient Hospital Stay (HOSPITAL_COMMUNITY)
Admission: EM | Admit: 2020-10-03 | Discharge: 2020-10-07 | DRG: 683 | Disposition: A | Discharge status: Home Health | Payer: Medicare Other | Attending: Internal Medicine | Admitting: Internal Medicine

## 2020-10-03 ENCOUNTER — Other Ambulatory Visit: Payer: Self-pay

## 2020-10-03 ENCOUNTER — Emergency Department (HOSPITAL_COMMUNITY): Payer: Medicare Other

## 2020-10-03 ENCOUNTER — Ambulatory Visit (HOSPITAL_COMMUNITY)
Admission: RE | Admit: 2020-10-03 | Discharge: 2020-10-03 | Disposition: A | Discharge status: Home / Self Care | Payer: Medicare Other | Attending: Psychiatry | Admitting: Psychiatry

## 2020-10-03 DIAGNOSIS — Z886 Allergy status to analgesic agent status: Secondary | ICD-10-CM

## 2020-10-03 DIAGNOSIS — M8580 Other specified disorders of bone density and structure, unspecified site: Secondary | ICD-10-CM | POA: Diagnosis present

## 2020-10-03 DIAGNOSIS — Z20822 Contact with and (suspected) exposure to covid-19: Secondary | ICD-10-CM | POA: Diagnosis present

## 2020-10-03 DIAGNOSIS — E876 Hypokalemia: Secondary | ICD-10-CM

## 2020-10-03 DIAGNOSIS — J4540 Moderate persistent asthma, uncomplicated: Secondary | ICD-10-CM | POA: Diagnosis present

## 2020-10-03 DIAGNOSIS — Z7951 Long term (current) use of inhaled steroids: Secondary | ICD-10-CM

## 2020-10-03 DIAGNOSIS — E440 Moderate protein-calorie malnutrition: Secondary | ICD-10-CM | POA: Insufficient documentation

## 2020-10-03 DIAGNOSIS — Z79899 Other long term (current) drug therapy: Secondary | ICD-10-CM

## 2020-10-03 DIAGNOSIS — Z9049 Acquired absence of other specified parts of digestive tract: Secondary | ICD-10-CM

## 2020-10-03 DIAGNOSIS — Z833 Family history of diabetes mellitus: Secondary | ICD-10-CM

## 2020-10-03 DIAGNOSIS — R45851 Suicidal ideations: Secondary | ICD-10-CM | POA: Diagnosis present

## 2020-10-03 DIAGNOSIS — I7300 Raynaud's syndrome without gangrene: Secondary | ICD-10-CM | POA: Diagnosis present

## 2020-10-03 DIAGNOSIS — K3184 Gastroparesis: Secondary | ICD-10-CM | POA: Diagnosis present

## 2020-10-03 DIAGNOSIS — N179 Acute kidney failure, unspecified: Principal | ICD-10-CM

## 2020-10-03 DIAGNOSIS — F5000 Anorexia nervosa, unspecified: Secondary | ICD-10-CM | POA: Diagnosis present

## 2020-10-03 DIAGNOSIS — G894 Chronic pain syndrome: Secondary | ICD-10-CM | POA: Diagnosis present

## 2020-10-03 DIAGNOSIS — F4481 Dissociative identity disorder: Secondary | ICD-10-CM | POA: Diagnosis present

## 2020-10-03 DIAGNOSIS — G9340 Encephalopathy, unspecified: Secondary | ICD-10-CM

## 2020-10-03 DIAGNOSIS — E1143 Type 2 diabetes mellitus with diabetic autonomic (poly)neuropathy: Secondary | ICD-10-CM | POA: Diagnosis present

## 2020-10-03 DIAGNOSIS — F32A Depression, unspecified: Secondary | ICD-10-CM | POA: Diagnosis present

## 2020-10-03 DIAGNOSIS — Z885 Allergy status to narcotic agent status: Secondary | ICD-10-CM

## 2020-10-03 DIAGNOSIS — E119 Type 2 diabetes mellitus without complications: Secondary | ICD-10-CM

## 2020-10-03 DIAGNOSIS — F509 Eating disorder, unspecified: Secondary | ICD-10-CM | POA: Diagnosis present

## 2020-10-03 DIAGNOSIS — Z881 Allergy status to other antibiotic agents status: Secondary | ICD-10-CM

## 2020-10-03 DIAGNOSIS — F4312 Post-traumatic stress disorder, chronic: Secondary | ICD-10-CM | POA: Diagnosis present

## 2020-10-03 DIAGNOSIS — Z880 Allergy status to penicillin: Secondary | ICD-10-CM

## 2020-10-03 DIAGNOSIS — F411 Generalized anxiety disorder: Secondary | ICD-10-CM | POA: Diagnosis present

## 2020-10-03 DIAGNOSIS — R630 Anorexia: Secondary | ICD-10-CM | POA: Diagnosis not present

## 2020-10-03 DIAGNOSIS — E860 Dehydration: Secondary | ICD-10-CM

## 2020-10-03 DIAGNOSIS — Z7984 Long term (current) use of oral hypoglycemic drugs: Secondary | ICD-10-CM

## 2020-10-03 DIAGNOSIS — Z825 Family history of asthma and other chronic lower respiratory diseases: Secondary | ICD-10-CM

## 2020-10-03 DIAGNOSIS — E559 Vitamin D deficiency, unspecified: Secondary | ICD-10-CM | POA: Diagnosis present

## 2020-10-03 DIAGNOSIS — I10 Essential (primary) hypertension: Secondary | ICD-10-CM | POA: Diagnosis present

## 2020-10-03 DIAGNOSIS — Z6372 Alcoholism and drug addiction in family: Secondary | ICD-10-CM

## 2020-10-03 DIAGNOSIS — Z8659 Personal history of other mental and behavioral disorders: Secondary | ICD-10-CM

## 2020-10-03 DIAGNOSIS — Z811 Family history of alcohol abuse and dependence: Secondary | ICD-10-CM

## 2020-10-03 DIAGNOSIS — K219 Gastro-esophageal reflux disease without esophagitis: Secondary | ICD-10-CM | POA: Diagnosis present

## 2020-10-03 DIAGNOSIS — Z87891 Personal history of nicotine dependence: Secondary | ICD-10-CM

## 2020-10-03 LAB — COMPREHENSIVE METABOLIC PANEL
ALT: 15 U/L (ref 0–44)
AST: 22 U/L (ref 15–41)
Albumin: 4.2 g/dL (ref 3.5–5.0)
Alkaline Phosphatase: 54 U/L (ref 38–126)
Anion gap: 13 (ref 5–15)
BUN: 40 mg/dL — ABNORMAL HIGH (ref 8–23)
CO2: 25 mmol/L (ref 22–32)
Calcium: 9.3 mg/dL (ref 8.9–10.3)
Chloride: 96 mmol/L — ABNORMAL LOW (ref 98–111)
Creatinine, Ser: 2.58 mg/dL — ABNORMAL HIGH (ref 0.44–1.00)
GFR, Estimated: 20 mL/min — ABNORMAL LOW (ref >60)
Glucose, Bld: 115 mg/dL — ABNORMAL HIGH (ref 70–99)
Potassium: 3.2 mmol/L — ABNORMAL LOW (ref 3.5–5.1)
Sodium: 134 mmol/L — ABNORMAL LOW (ref 135–145)
Total Bilirubin: 0.8 mg/dL (ref 0.3–1.2)
Total Protein: 6.6 g/dL (ref 6.5–8.1)

## 2020-10-03 LAB — CBC WITH DIFFERENTIAL/PLATELET
Abs Immature Granulocytes: 0.04 10*3/uL (ref 0.00–0.07)
Basophils Absolute: 0 10*3/uL (ref 0.0–0.1)
Basophils Relative: 0 %
Eosinophils Absolute: 0 10*3/uL (ref 0.0–0.5)
Eosinophils Relative: 0 %
HCT: 36.7 % (ref 36.0–46.0)
Hemoglobin: 12.3 g/dL (ref 12.0–15.0)
Immature Granulocytes: 0 %
Lymphocytes Relative: 15 %
Lymphs Abs: 1.3 10*3/uL (ref 0.7–4.0)
MCH: 31.2 pg (ref 26.0–34.0)
MCHC: 33.5 g/dL (ref 30.0–36.0)
MCV: 93.1 fL (ref 80.0–100.0)
Monocytes Absolute: 0.9 10*3/uL (ref 0.1–1.0)
Monocytes Relative: 10 %
Neutro Abs: 6.6 10*3/uL (ref 1.7–7.7)
Neutrophils Relative %: 75 %
Platelets: 311 10*3/uL (ref 150–400)
RBC: 3.94 MIL/uL (ref 3.87–5.11)
RDW: 13.2 % (ref 11.5–15.5)
WBC: 8.9 10*3/uL (ref 4.0–10.5)
nRBC: 0 % (ref 0.0–0.2)

## 2020-10-03 LAB — PHOSPHORUS: Phosphorus: 5.2 mg/dL — ABNORMAL HIGH (ref 2.5–4.6)

## 2020-10-03 LAB — AMMONIA: Ammonia: 10 umol/L (ref 9–35)

## 2020-10-03 LAB — ACETAMINOPHEN LEVEL: Acetaminophen (Tylenol), Serum: <10 ug/mL — ABNORMAL LOW (ref 10–30)

## 2020-10-03 LAB — SALICYLATE LEVEL: Salicylate Lvl: <7 mg/dL — ABNORMAL LOW (ref 7.0–30.0)

## 2020-10-03 LAB — MAGNESIUM: Magnesium: 1.7 mg/dL (ref 1.7–2.4)

## 2020-10-03 LAB — ETHANOL: Alcohol, Ethyl (B): <10 mg/dL (ref <10)
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
## 2020-10-03 NOTE — H&P (Signed)
Behavioral Health Medical Screening Exam  Becky [name] is a 65 y.o. female seen by this provider with TTS counselor on tele psych. Medical screening started after patient was able to identify herself. She struggled with giving her name initially. Her sister interjected and helped patient with this information. Patient had listed that she was having difficulty articulating her words this morning, having balance issues, and some confusion. Sister describes a situation this morning where the patient was unable to tell her anything. There is concern with these symptoms the patient is having a medical emergency and will send to [facility] for medical clearance before the rest of the psychiatric evaluation will be completed.   Patient is sitting in exam room alert. Skin is warm and dry. Denies chest pain, and does not exhibit any obvious shortness of breath. Okay to go via safe transport and sister will follow.   Total Time spent with patient: 20 minutes  Psychiatric Specialty Exam:  Presentation  General Appearance:  Appropriate for Environment Eye Contact: Fair Speech: Normal Rate Speech Volume: Normal Handedness: No data recorded  Mood and Affect  Mood: Euthymic Affect: Congruent  Thought Process  Thought Processes: Other (comment) (psychiatric evaluation incomplete due to needing medical clearance.) Descriptions of Associations:No data recorded Orientation:No data recorded Thought Content:No data recorded History of Schizophrenia/Schizoaffective disorder:No data recorded Duration of Psychotic Symptoms:No data recorded Hallucinations:No data recorded Ideas of Reference:No data recorded Suicidal Thoughts:No data recorded Homicidal Thoughts:No data recorded  Sensorium  Memory: No data recorded Judgment: No data recorded Insight: No data recorded  Executive Functions  Concentration: No data recorded Attention Span: No data recorded Recall: No data  recorded Fund of Knowledge: No data recorded Language: No data recorded  Psychomotor Activity  Psychomotor Activity: No data recorded  Assets  Assets: No data recorded  Sleep  Sleep: No data recorded   Physical Exam: Physical Exam Vitals reviewed.  Constitutional:      General: She is not in acute distress. Cardiovascular:     Rate and Rhythm: Normal rate.  Pulmonary:     Effort: Pulmonary effort is normal.     Breath sounds: Normal breath sounds.  Skin:    General: Skin is warm and dry.  Neurological:     Mental Status: She is alert. She is disoriented.     Comments: Struggled to tell us her name.   Psychiatric:        Attention and Perception: Attention normal.        Mood and Affect: Affect is flat.        Speech: Speech is delayed.        Behavior: Behavior is cooperative.   Review of Systems  Constitutional:  Negative for chills and fever.  Respiratory:  Negative for shortness of breath.   Cardiovascular:  Negative for chest pain.  Gastrointestinal:  Negative for abdominal pain.  Neurological:  Negative for headaches.  Blood pressure (!) 127/95, pulse 75, temperature 98.1 F (36.7 C), temperature source Oral, resp. rate 20, SpO2 98 %. There is no height or weight on file to calculate BMI.  Musculoskeletal: Strength & Muscle Tone: within normal limits Gait & Station: normal Patient leans: N/A   Recommendations:  Based on my evaluation the patient appears to have an emergency medical condition for which I recommend the patient be transferred to the emergency department for further evaluation. Recommend going to [facility] for possible neurological symptoms for medical clearance. Sister and patient agree with plan. Please place  patient on TTS list for assessment once medically cleared. Charge Nurse notified and EDP notified via secure chat. EMTALA completed.   Chalmers Guest, NP 10/03/2020, 3:53 PM

## 2020-10-03 NOTE — ED Triage Notes (Signed)
Pt's sister brought her to the hospital for medical clearance. Sister states pt was seen by behavioral health and was tols she needed to medically cleared before they would see her. Pt is a poor historian but denies any pain or SI/HI.

## 2020-10-03 NOTE — BH Assessment (Signed)
Becky [name]. arianne, grider, MR #[id number]; 65 years old presents this date with her sister Drue Flirt, [phone], as walk in at [facility].  Pt presents with shaking, poor judgement; also, unable to articulated.  Provider completed MSE and recommends pt be medical clear.

## 2020-10-04 ENCOUNTER — Inpatient Hospital Stay (HOSPITAL_COMMUNITY): Payer: Medicare Other

## 2020-10-04 DIAGNOSIS — Z8659 Personal history of other mental and behavioral disorders: Secondary | ICD-10-CM | POA: Diagnosis not present

## 2020-10-04 DIAGNOSIS — F5000 Anorexia nervosa, unspecified: Secondary | ICD-10-CM

## 2020-10-04 DIAGNOSIS — J4540 Moderate persistent asthma, uncomplicated: Secondary | ICD-10-CM | POA: Diagnosis present

## 2020-10-04 DIAGNOSIS — I10 Essential (primary) hypertension: Secondary | ICD-10-CM

## 2020-10-04 DIAGNOSIS — M8580 Other specified disorders of bone density and structure, unspecified site: Secondary | ICD-10-CM | POA: Diagnosis present

## 2020-10-04 DIAGNOSIS — N179 Acute kidney failure, unspecified: Secondary | ICD-10-CM | POA: Diagnosis present

## 2020-10-04 DIAGNOSIS — G9340 Encephalopathy, unspecified: Secondary | ICD-10-CM | POA: Diagnosis present

## 2020-10-04 DIAGNOSIS — E559 Vitamin D deficiency, unspecified: Secondary | ICD-10-CM | POA: Diagnosis present

## 2020-10-04 DIAGNOSIS — Z886 Allergy status to analgesic agent status: Secondary | ICD-10-CM | POA: Diagnosis not present

## 2020-10-04 DIAGNOSIS — Z9049 Acquired absence of other specified parts of digestive tract: Secondary | ICD-10-CM | POA: Diagnosis not present

## 2020-10-04 DIAGNOSIS — I7300 Raynaud's syndrome without gangrene: Secondary | ICD-10-CM | POA: Diagnosis present

## 2020-10-04 DIAGNOSIS — Z87891 Personal history of nicotine dependence: Secondary | ICD-10-CM | POA: Diagnosis not present

## 2020-10-04 DIAGNOSIS — E119 Type 2 diabetes mellitus without complications: Secondary | ICD-10-CM | POA: Diagnosis not present

## 2020-10-04 DIAGNOSIS — F4312 Post-traumatic stress disorder, chronic: Secondary | ICD-10-CM | POA: Diagnosis present

## 2020-10-04 DIAGNOSIS — K3184 Gastroparesis: Secondary | ICD-10-CM | POA: Diagnosis present

## 2020-10-04 DIAGNOSIS — G894 Chronic pain syndrome: Secondary | ICD-10-CM | POA: Diagnosis present

## 2020-10-04 DIAGNOSIS — F5001 Anorexia nervosa, restricting type: Secondary | ICD-10-CM

## 2020-10-04 DIAGNOSIS — F411 Generalized anxiety disorder: Secondary | ICD-10-CM | POA: Diagnosis present

## 2020-10-04 DIAGNOSIS — E860 Dehydration: Secondary | ICD-10-CM | POA: Diagnosis present

## 2020-10-04 DIAGNOSIS — F32A Depression, unspecified: Secondary | ICD-10-CM | POA: Diagnosis present

## 2020-10-04 DIAGNOSIS — K219 Gastro-esophageal reflux disease without esophagitis: Secondary | ICD-10-CM | POA: Diagnosis present

## 2020-10-04 DIAGNOSIS — F4481 Dissociative identity disorder: Secondary | ICD-10-CM | POA: Diagnosis present

## 2020-10-04 DIAGNOSIS — E1143 Type 2 diabetes mellitus with diabetic autonomic (poly)neuropathy: Secondary | ICD-10-CM | POA: Diagnosis present

## 2020-10-04 DIAGNOSIS — R630 Anorexia: Secondary | ICD-10-CM | POA: Diagnosis present

## 2020-10-04 DIAGNOSIS — R45851 Suicidal ideations: Secondary | ICD-10-CM | POA: Diagnosis present

## 2020-10-04 DIAGNOSIS — E440 Moderate protein-calorie malnutrition: Secondary | ICD-10-CM | POA: Diagnosis present

## 2020-10-04 DIAGNOSIS — Z20822 Contact with and (suspected) exposure to covid-19: Secondary | ICD-10-CM | POA: Diagnosis present

## 2020-10-04 LAB — BASIC METABOLIC PANEL
Anion gap: 11 (ref 5–15)
BUN: 37 mg/dL — ABNORMAL HIGH (ref 8–23)
CO2: 19 mmol/L — ABNORMAL LOW (ref 22–32)
Calcium: 8.2 mg/dL — ABNORMAL LOW (ref 8.9–10.3)
Chloride: 101 mmol/L (ref 98–111)
Creatinine, Ser: 2.3 mg/dL — ABNORMAL HIGH (ref 0.44–1.00)
GFR, Estimated: 23 mL/min — ABNORMAL LOW (ref >60)
Glucose, Bld: 106 mg/dL — ABNORMAL HIGH (ref 70–99)
Potassium: 4.2 mmol/L (ref 3.5–5.1)
Sodium: 131 mmol/L — ABNORMAL LOW (ref 135–145)

## 2020-10-04 LAB — RESP PANEL BY RT-PCR (FLU A&B, COVID) ARPGX2
Influenza A by PCR: NEGATIVE
Influenza B by PCR: NEGATIVE
SARS Coronavirus 2 by RT PCR: NEGATIVE

## 2020-10-04 LAB — URINALYSIS, ROUTINE W REFLEX MICROSCOPIC
Bilirubin Urine: NEGATIVE
Glucose, UA: NEGATIVE mg/dL
Hgb urine dipstick: NEGATIVE
Ketones, ur: 5 mg/dL — AB
Leukocytes,Ua: NEGATIVE
Nitrite: NEGATIVE
Protein, ur: NEGATIVE mg/dL
Specific Gravity, Urine: 1.013 (ref 1.005–1.030)
pH: 5 (ref 5.0–8.0)

## 2020-10-04 LAB — HIV ANTIBODY (ROUTINE TESTING W REFLEX): HIV Screen 4th Generation wRfx: NONREACTIVE

## 2020-10-04 LAB — CBG MONITORING, ED
Glucose-Capillary: 110 mg/dL — ABNORMAL HIGH (ref 70–99)
Glucose-Capillary: 104 mg/dL — ABNORMAL HIGH (ref 70–99)
Glucose-Capillary: 113 mg/dL — ABNORMAL HIGH (ref 70–99)

## 2020-10-04 LAB — RAPID URINE DRUG SCREEN, HOSP PERFORMED
Amphetamines: NOT DETECTED
Barbiturates: NOT DETECTED
Benzodiazepines: NOT DETECTED
Cocaine: NOT DETECTED
Opiates: NOT DETECTED
Tetrahydrocannabinol: NOT DETECTED

## 2020-10-04 LAB — HEMOGLOBIN A1C
Hgb A1c MFr Bld: 6 % — ABNORMAL HIGH (ref 4.8–5.6)
Mean Plasma Glucose: 125.5 mg/dL

## 2020-10-04 LAB — GLUCOSE, CAPILLARY
Glucose-Capillary: 174 mg/dL — ABNORMAL HIGH (ref 70–99)
Glucose-Capillary: 110 mg/dL — ABNORMAL HIGH (ref 70–99)

## 2020-10-04 MED ORDER — MOMETASONE FURO-FORMOTEROL FUM 200-5 MCG/ACT IN AERO
2.0000 | INHALATION_SPRAY | Freq: Two times a day (BID) | RESPIRATORY_TRACT | Status: DC
  Administered 2020-10-04 – 2020-10-07 (×7): 2 via RESPIRATORY_TRACT
  Filled 2020-10-04: qty 8.8

## 2020-10-04 MED ORDER — ALBUTEROL SULFATE HFA 108 (90 BASE) MCG/ACT IN AERS: 2.0000 | INHALATION_SPRAY | Freq: Four times a day (QID) | RESPIRATORY_TRACT | Status: DC | PRN

## 2020-10-04 MED ORDER — LURASIDONE HCL 60 MG PO TABS: 120.0000 mg | ORAL_TABLET | Freq: Every day | ORAL | Status: DC

## 2020-10-04 MED ORDER — ACETAMINOPHEN 650 MG RE SUPP: 650.0000 mg | Freq: Four times a day (QID) | RECTAL | Status: DC | PRN

## 2020-10-04 MED ORDER — LACTATED RINGERS IV SOLN: INTRAVENOUS | Status: DC

## 2020-10-04 MED ORDER — POTASSIUM CHLORIDE 10 MEQ/100ML IV SOLN
10.0000 meq | INTRAVENOUS | Status: AC
  Administered 2020-10-04 (×2): 10 meq via INTRAVENOUS
  Filled 2020-10-04: qty 100

## 2020-10-04 MED ORDER — LURASIDONE HCL 40 MG PO TABS
120.0000 mg | ORAL_TABLET | Freq: Every day | ORAL | Status: DC
  Administered 2020-10-04 – 2020-10-06 (×3): 120 mg via ORAL
  Filled 2020-10-04 (×5): qty 3

## 2020-10-04 MED ORDER — HEPARIN SODIUM (PORCINE) 5000 UNIT/ML IJ SOLN
5000.0000 [IU] | Freq: Three times a day (TID) | INTRAMUSCULAR | Status: DC
  Administered 2020-10-04 – 2020-10-06 (×8): 5000 [IU] via SUBCUTANEOUS
  Filled 2020-10-04 (×9): qty 1

## 2020-10-04 MED ORDER — SODIUM CHLORIDE 0.9 % IV BOLUS
1000.0000 mL | Freq: Once | INTRAVENOUS | Status: AC
  Administered 2020-10-04: 1000 mL via INTRAVENOUS

## 2020-10-04 MED ORDER — ACETAMINOPHEN 325 MG PO TABS: 650.0000 mg | ORAL_TABLET | Freq: Four times a day (QID) | ORAL | Status: DC | PRN

## 2020-10-04 MED ORDER — PANTOPRAZOLE SODIUM 40 MG PO TBEC
80.0000 mg | DELAYED_RELEASE_TABLET | Freq: Every day | ORAL | Status: DC
  Administered 2020-10-04 – 2020-10-07 (×4): 80 mg via ORAL
  Filled 2020-10-04 (×4): qty 2

## 2020-10-04 MED ORDER — ONDANSETRON HCL 4 MG/2ML IJ SOLN: 4.0000 mg | Freq: Four times a day (QID) | INTRAMUSCULAR | Status: DC | PRN

## 2020-10-04 MED ORDER — QUETIAPINE FUMARATE 50 MG PO TABS
100.0000 mg | ORAL_TABLET | Freq: Every day | ORAL | Status: DC
  Administered 2020-10-04 – 2020-10-06 (×3): 100 mg via ORAL
  Filled 2020-10-04 (×3): qty 2
  Filled 2020-10-04: qty 1

## 2020-10-04 MED ORDER — INSULIN ASPART 100 UNIT/ML IJ SOLN
0.0000 [IU] | Freq: Three times a day (TID) | INTRAMUSCULAR | Status: DC
  Administered 2020-10-04 – 2020-10-06 (×2): 1 [IU] via SUBCUTANEOUS

## 2020-10-04 MED ORDER — ONDANSETRON HCL 4 MG PO TABS: 4.0000 mg | ORAL_TABLET | Freq: Four times a day (QID) | ORAL | Status: DC | PRN

## 2020-10-04 MED ORDER — NEBIVOLOL HCL 10 MG PO TABS
10.0000 mg | ORAL_TABLET | Freq: Every day | ORAL | Status: DC
  Administered 2020-10-04 – 2020-10-07 (×4): 10 mg via ORAL
  Filled 2020-10-04 (×4): qty 1
  Filled 2020-10-04 (×2): qty 2

## 2020-10-04 MED ORDER — MELATONIN 5 MG PO TABS
10.0000 mg | ORAL_TABLET | Freq: Every evening | ORAL | Status: DC | PRN
  Administered 2020-10-06: 10 mg via ORAL
  Filled 2020-10-04: qty 2

## 2020-10-04 MED ORDER — BUPROPION HCL ER (XL) 150 MG PO TB24
450.0000 mg | ORAL_TABLET | Freq: Every morning | ORAL | Status: DC
  Administered 2020-10-04 – 2020-10-07 (×4): 450 mg via ORAL
  Filled 2020-10-04 (×3): qty 3
  Filled 2020-10-04: qty 1

## 2020-10-04 MED ORDER — BENZTROPINE MESYLATE 2 MG PO TABS
2.0000 mg | ORAL_TABLET | Freq: Two times a day (BID) | ORAL | Status: DC
  Administered 2020-10-04 – 2020-10-07 (×7): 2 mg via ORAL
  Filled 2020-10-04 (×9): qty 1

## 2020-10-04 MED ORDER — ALBUTEROL SULFATE (2.5 MG/3ML) 0.083% IN NEBU: 2.5000 mg | INHALATION_SOLUTION | Freq: Four times a day (QID) | RESPIRATORY_TRACT | Status: DC | PRN

## 2020-10-04 MED ORDER — DULOXETINE HCL 60 MG PO CPEP
120.0000 mg | ORAL_CAPSULE | Freq: Every day | ORAL | Status: DC
  Administered 2020-10-04 – 2020-10-07 (×4): 120 mg via ORAL
  Filled 2020-10-04 (×4): qty 2

## 2020-10-04 NOTE — Progress Notes (Signed)
Patient refused skin assessment and changing into a gown. However, she did allow cardiac telemetry.

## 2020-10-04 NOTE — ED Notes (Signed)
Pt ambulated to bathroom with 1 person assist. Pt very shaky and unsteady on feet
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
## 2020-10-04 NOTE — Progress Notes (Signed)
PROGRESS NOTE  Becky [name]  DOB: 15-May-1955  PCP: [name], MD [id number]  DOA: 10/03/2020  LOS: 0 days  Hospital Day: 2   Chief Complaint  Patient presents with   Medical Clearance    Brief narrative: Becky [name] is a 65 y.o. female with PMH significant for DM2, HTN, Anorexia Nervosa, dissociative identity disorder, chronic pain syndrome, neuropathy, PTSD, chronic tremors  On 8/23, patient was taken to behavioral health by her sister with complaint of shaking, poor judgment.  Patient was referred to ED for medical clearance before she could get help with a psychiatrist. Patient complained of worsening fatigue, flareup of anorexia.  Denies suicidal or homicidal ideation.  Sister noted a general decline in the patient over the course of last 2 to 4 weeks.  In the ED, patient was hemodynamically stable. Labs with sodium 134, potassium 3.2, BUN/creatinine 40/2.58, baseline 1.1 few months ago.  Admitted to hospitalist service for further evaluation management   Subjective: Patient was seen and examined this morning.  Waiting for inpatient bed availability in the ED. Not in distress.  Getting IV fluid.  Assessment/Plan: AKI -Creatinine was elevated to 2.58 on presentation.  Improving, 2.3 at this time.  Continue IV hydration. Recent Labs    10/03/20 1949 10/04/20 0618  BUN 40* 37*  CREATININE 2.58* 2.30*   Acute flareup of anorexia nervosa -Poor oral intake, vomiting leading to dehydration -Psychiatry consultation  Disorientation -Slow to respond, knows he is in the hospital.  Unable to tell me the date -Unclear how long he has been disoriented for.  Unclear if this is acute or continue of her chronic cognitive impairment related to underlying psychiatric disorders.  Type 2 diabetes mellitus -A1c 6 on 8/24 -Home meds include metformin -Currently on hold -Blood sugar level controlled with sliding scale insulin. Recent Labs  Lab 10/04/20 0245  10/04/20 0822  GLUCAP 110* 104*   Essential hypertension -Continue Bystolic.  ICD on hold because of AKI and hyponatremia  Hypokalemia -Potassium level was low at 3.2.  Improving with replacement.  Recheck tomorrow. Recent Labs  Lab 10/03/20 1949 10/04/20 0618  K 3.2* 4.2  MG 1.7  --   PHOS 5.2*  --    Hyponatremia -Sodium level between 130 and 135.  Continue to monitor with hydration. Recent Labs  Lab 10/03/20 1949 10/04/20 0618  NA 134* 131*   Other psychiatric disorders Chronic tremors -Continue home meds.  Adjustment per psychiatry service.  Mobility: PT eval Code Status:   Code Status: Full Code  Nutritional status: Body mass index is 22.39 kg/m.     Diet:  Diet Order             Diet regular Room service appropriate? Yes; Fluid consistency: Thin  Diet effective now                  DVT prophylaxis:  heparin injection 5,000 Units Start: 10/04/20 0600   Antimicrobials: None Fluid: Currently on LR'@100'$  mill per hour Consultants: Psychiatry consulted Family Communication: None at bedside  Status is: Inpatient  Remains inpatient appropriate because: Needs IV hydration, electrolyte monitoring  Dispo: The patient is from: Home              Anticipated d/c is to: Pending psychiatry consultation              Patient currently is not medically stable to d/c.   Difficult to place patient No     Infusions:   lactated  ringers 100 mL/hr at 10/04/20 0506    Scheduled Meds:  benztropine  2 mg Oral BID   buPROPion  450 mg Oral q morning   DULoxetine  120 mg Oral Daily   heparin  5,000 Units Subcutaneous Q8H   insulin aspart  0-6 Units Subcutaneous TID WC   lurasidone  120 mg Oral QHS   mometasone-formoterol  2 puff Inhalation BID   nebivolol  10 mg Oral Daily   pantoprazole  80 mg Oral Daily   QUEtiapine  100 mg Oral QHS    Antimicrobials: Anti-infectives (From admission, onward)    None       PRN meds: acetaminophen **OR**  acetaminophen, albuterol, melatonin, ondansetron **OR** ondansetron (ZOFRAN) IV   Objective: Vitals:   10/04/20 0817 10/04/20 0902  BP: 121/72 121/67  Pulse: 72 76  Resp: 20 18  Temp: 98.3 F (36.8 C)   SpO2: 98% 97%   No intake or output data in the 24 hours ending 10/04/20 1012 Filed Weights   10/03/20 1922  Weight: 52 kg   Weight change:  Body mass index is 22.39 kg/m.   Physical Exam: General exam: Pleasant, middle-aged Caucasian female Skin: No rashes, lesions or ulcers. HEENT: Atraumatic, normocephalic, no obvious bleeding Lungs: Clear to auscultation bilaterally CVS: Regular rate and rhythm, no murmur GI/Abd soft, nontender, nondistended, bowel sound present CNS: Alert, awake, slow to respond, oriented to place Psychiatry: Depressed look Extremities: No pedal edema, no calf tenderness  Data Review: I have personally reviewed the laboratory data and studies available.  Recent Labs  Lab 10/03/20 1949  WBC 8.9  NEUTROABS 6.6  HGB 12.3  HCT 36.7  MCV 93.1  PLT 311   Recent Labs  Lab 10/03/20 1949 10/04/20 0618  NA 134* 131*  K 3.2* 4.2  CL 96* 101  CO2 25 19*  GLUCOSE 115* 106*  BUN 40* 37*  CREATININE 2.58* 2.30*  CALCIUM 9.3 8.2*  MG 1.7  --   PHOS 5.2*  --     F/u labs ordered Unresulted Labs (From admission, onward)     Start     Ordered   10/05/20 0500  CBC with Differential/Platelet  Daily,   R      10/04/20 1011   10/05/20 [id number]  Basic metabolic panel  Daily,   R      10/04/20 1011   10/05/20 0500  Magnesium  Tomorrow morning,   STAT        10/04/20 1011   10/05/20 0500  Phosphorus  Tomorrow morning,   R        10/04/20 1011   10/04/20 0334  HIV Antibody (routine testing w rflx)  (HIV Antibody (Routine testing w reflex) panel)  Once,   STAT        10/04/20 0335   10/04/20 0202  Resp Panel by RT-PCR (Flu A&B, Covid) Nasopharyngeal Swab  (Tier 2 - Symptomatic/asymptomatic with Precautions )  Once,   STAT       Question Answer Comment   Is this test for diagnosis or screening Screening   Symptomatic for COVID-19 as defined by CDC No   Hospitalized for COVID-19 No   Admitted to ICU for COVID-19 No   Previously tested for COVID-19 Yes   Resident in a congregate (group) care setting Unknown   Employed in healthcare setting Unknown   Pregnant No   Has patient completed COVID vaccination(s) (2 doses of Pfizer/Moderna 1 dose of [employer]) Unknown  10/04/20 0201   10/03/20 1935  Vitamin B1  Once,   STAT        10/03/20 1935            Signed, Terrilee Croak, MD [facility] 10/04/2020

## 2020-10-04 NOTE — Progress Notes (Signed)
Physical Therapy Evaluation Patient Details Name: Becky [name] MRN: [id number] DOB: 08-Dec-1955 Today's Date: 10/04/2020   History of Present Illness  Pt is a 65yo female presenting to [facility] ED on 8/23, with complaints of shaking and confusion. Of note, sister brought pt to behavioral health and was told pt needed medical clearance so was taken to [facility]. Imaging negative for acute findings. PMH: Anorexia nervosa, HTN, DM, confusion, dissociative identity disorder, chronic pain syndrome, orthostatic hypotension.   Clinical Impression  Pt presents with the impairments above and problems listed below. Pt supervision to min guard for all mobility tasks today. Noted intention tremor during strength testing that is especially apparent during ambulation tasks; pt reports she has been previously diagnosed with tardive dyskinesia. Pt does report feeling increased depression recently. Per notes, pt may be discharging to inpatient psych. We will continue to follow her acutely as appropriate to promote independence with functional mobility.    Follow Up Recommendations Other (comment) (TBD, possibly inpatient psych pending evaluation)    Equipment Recommendations  Rolling walker with 5" wheels    Recommendations for Other Services       Precautions / Restrictions Precautions Precautions: Fall Restrictions Weight Bearing Restrictions: No      Mobility  Bed Mobility Overal bed mobility: Needs Assistance Bed Mobility: Supine to Sit;Sit to Supine     Supine to sit: Supervision;Min guard Sit to supine: Supervision;Min guard   General bed mobility comments: Pt required min guard to supervision for safety and line management only.    Transfers Overall transfer level: Needs assistance Equipment used: None Transfers: Sit to/from Stand Sit to Stand: Min guard         General transfer comment: Pt required min guard for safety and line management only.  Ambulation/Gait Ambulation/Gait  assistance: Min guard Gait Distance (Feet): 8 Feet Assistive device: None Gait Pattern/deviations: Step-through pattern;Decreased step length - right;Decreased step length - left;Narrow base of support Gait velocity: decreased   General Gait Details: Upon initiation of gait, noted shaking in all four extremities; pt reported this is baseline since ~6 months ago and was diagnosed at that time with tardive dyskinesia. Pt demonstrated decreased step length bilaterally, reduced base of support, and some freezing of gait. Pt able to ambulate forward and backward at EOB x4 with unsteadiness, but no overt LOB.  Stairs            Wheelchair Mobility    Modified Rankin (Stroke Patients Only)       Balance Overall balance assessment: Needs assistance Sitting-balance support: No upper extremity supported Sitting balance-Leahy Scale: Good     Standing balance support: No upper extremity supported;During functional activity Standing balance-Leahy Scale: Fair Standing balance comment: Pt able to maintain static standing without UE support                             Pertinent Vitals/Pain Pain Assessment: No/denies pain    Home Living Family/patient expects to be discharged to:: Private residence Living Arrangements: Alone Available Help at Discharge: Family;Available PRN/intermittently Type of Home: Apartment Home Access: Stairs to enter Entrance Stairs-Rails: Psychiatric nurse of Steps: 9 Home Layout: Multi-level Home Equipment: None Additional Comments: Pt has a dog    Prior Function Level of Independence: Independent               Hand Dominance   Dominant Hand: Right    Extremity/Trunk Assessment   Upper Extremity Assessment Upper Extremity  Assessment: Generalized weakness;RUE deficits/detail;LUE deficits/detail RUE Deficits / Details: Strength grossly 3+/5, ROM WFL RUE Sensation: WNL RUE Coordination: decreased gross motor (Pt  demonstrated intention tremor.) LUE Deficits / Details: Strength grossly 3+/5, ROM WFL LUE Sensation: WNL LUE Coordination: decreased gross motor (Pt demonstrated intention tremor.)    Lower Extremity Assessment Lower Extremity Assessment: Generalized weakness;RLE deficits/detail;LLE deficits/detail RLE Deficits / Details: Strength grossly 4/5, ROM WFL RLE Sensation: WNL RLE Coordination: decreased gross motor (Pt demonstrated intention tremor.) LLE Deficits / Details: Strength grossly 4/5, ROM WFL LLE Sensation: WNL LLE Coordination: decreased gross motor (Pt demonstrated intention tremor. Required multiple verbal cues for sequencing prior to performing correctly)    Cervical / Trunk Assessment Cervical / Trunk Assessment: Normal  Communication   Communication: No difficulties  Cognition Arousal/Alertness: Awake/alert Behavior During Therapy: WFL for tasks assessed/performed Overall Cognitive Status: No family/caregiver present to determine baseline cognitive functioning                                 General Comments: Pt AOx4, mentioned that she has been feeling depressed recently.      General Comments      Exercises     Assessment/Plan    PT Assessment Patient needs continued PT services  PT Problem List Decreased strength;Decreased activity tolerance;Decreased balance;Decreased mobility;Decreased coordination;Decreased cognition;Decreased safety awareness       PT Treatment Interventions DME instruction;Stair training;Gait training;Functional mobility training;Therapeutic activities;Therapeutic exercise;Balance training;Patient/family education;Neuromuscular re-education    PT Goals (Current goals can be found in the Care Plan section)  Acute Rehab PT Goals Patient Stated Goal: to feel better PT Goal Formulation: With patient Time For Goal Achievement: 10/18/20 Potential to Achieve Goals: Good    Frequency Min 3X/week   Barriers to discharge         Co-evaluation               AM-PAC PT "6 Clicks" Mobility  Outcome Measure Help needed turning from your back to your side while in a flat bed without using bedrails?: None Help needed moving from lying on your back to sitting on the side of a flat bed without using bedrails?: A Little Help needed moving to and from a bed to a chair (including a wheelchair)?: A Little Help needed standing up from a chair using your arms (e.g., wheelchair or bedside chair)?: A Little Help needed to walk in hospital room?: A Little Help needed climbing 3-5 steps with a railing? : A Little 6 Click Score: 19    End of Session Equipment Utilized During Treatment: Gait belt Activity Tolerance: Patient tolerated treatment well Patient left: in bed;with call bell/phone within reach (on stretcher in ED) Nurse Communication: Mobility status PT Visit Diagnosis: Unsteadiness on feet (R26.81);Other abnormalities of gait and mobility (R26.89);Muscle weakness (generalized) (M62.81)    Time: 1211-1229 PT Time Calculation (min) (ACUTE ONLY): 18 min   Charges:   PT Evaluation $PT Eval Moderate Complexity: 1 Mod          Dawayne Cirri, SPT  Dawayne Cirri 10/04/2020, 2:42 PM

## 2020-10-04 NOTE — Consult Note (Signed)
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  Speech:Normal Rate  Speech Volume:Normal  Handedness: No data recorded  Mood and Affect  Mood:Depressed  Affect:Constricted   Thought Process  Thought Processes:Coherent; Goal Directed  Descriptions of Associations:Intact  Orientation:Full (Time, Place and Person)  Thought Content:WDL  History of Schizophrenia/Schizoaffective disorder:No data recorded Duration of Psychotic Symptoms:No  data recorded Hallucinations:Hallucinations: None  Ideas of Reference:None  Suicidal Thoughts:Suicidal Thoughts: Yes, Passive (Contracts for safety) SI Passive Intent and/or Plan: Without  Intent; Without Plan  Homicidal Thoughts:Homicidal Thoughts: No   Sensorium  Memory:Immediate Good; Recent Fair; Remote Poor  Judgment:Fair  Insight:Good   Executive Functions  Concentration:Fair  Attention Span:Fair  [place]   Psychomotor Activity  Psychomotor Activity:Psychomotor Activity: Decreased   Assets  Assets:Communication Skills; Desire for Improvement; Financial Resources/Insurance; Resilience; Social Support   Sleep  Sleep:Sleep: Poor   Physical Exam: Physical Exam Vitals and nursing note reviewed.  Constitutional:      General: She is not in acute distress.    Appearance: Normal appearance. She is not ill-appearing, toxic-appearing or diaphoretic.  HENT:     Head: Normocephalic and atraumatic.  Pulmonary:     Effort: Pulmonary effort is normal.  Neurological:     General: No focal deficit present.     Mental Status: She is alert and oriented to person, place, and time.   Review of Systems  Constitutional:  Negative for chills and fever.  HENT:  Negative for hearing loss.   Respiratory:  Negative for cough and shortness of breath.   Cardiovascular:  Negative for chest pain.  Gastrointestinal:  Positive for nausea. Negative for vomiting.  Neurological:  Positive for headaches.  Psychiatric/Behavioral:  Positive for depression, memory loss and suicidal ideas. Negative for hallucinations and substance abuse. The patient is nervous/anxious and has insomnia.        Passive suicidal ideation, Contracts for safety  Blood pressure 96/75, pulse 81, temperature 98.3 F (36.8 C), temperature source Oral, resp. rate 18, height 5' (1.524 m), weight 52 kg, SpO2 99 %. Body mass index is 22.39 kg/m.  Treatment Plan Summary: Becky [name] is a 65 y.o. female with past medical history of anorexia nervosa, asthma, DM, hypertension admitted to ED from [facility] with concerns of confusion.  Patient was evaluated by NP  at [facility], she was confused had difficulty articulating her words, had balance issue.  Patient was sent to ED for medical clearance where she was found to have AKI with high creatinine which was managed with IV fluids and electrolyte replacement.  Psych consult placed for anorexia nervosa flare. On examination, patient is awake, alert, cooperative and oriented x4.  Knows current and past presidents.  Patient still has some confusion with dates.  Patient endorses chronic suicidal ideation but contracts for safety at this time.  Denies active suicidal ideation, homicidal ideation, AVH Recommendations -Patient does not meet criteria for inpatient psychiatry hospitalization.  Patient endorses chronic suicidal ideation but contracts for safety at this time. -Recommend social worker to give information about PHP program in [place]. -Recommend follow-up with her outpatient psychiatrist and therapist. -Recommend continuing her home medications. -Psychiatry will sign off.  Disposition: No evidence of imminent risk to self or others at present.   Patient does not meet criteria for psychiatric inpatient admission. Supportive therapy provided about ongoing stressors. Discussed crisis plan, support from social network, calling 911, coming to the Emergency Department, and calling Suicide Hotline.  Armando Reichert, MD 10/04/2020 3:35 PM

## 2020-10-04 NOTE — ED Notes (Signed)
Introduced myself to pt. AxO x4 with a GCS of 15. Pt denies pain/c/o. Is here to be cleared to see psych. Aware plan is IVF and potassium. Denies further needs.

## 2020-10-04 NOTE — ED Notes (Signed)
Attempted to give reportx1 

## 2020-10-04 NOTE — ED Notes (Signed)
Patient refused to put on a Gown, she stated she prefer to keep her clothes on, patient did remove her shoes in bed.

## 2020-10-04 NOTE — ED Notes (Signed)
Pt just got back to stretcher. ED physician at bedside.
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
## 2020-10-05 LAB — CBC WITH DIFFERENTIAL/PLATELET
Abs Immature Granulocytes: 0.01 10*3/uL (ref 0.00–0.07)
Basophils Absolute: 0 10*3/uL (ref 0.0–0.1)
Basophils Relative: 1 %
Eosinophils Absolute: 0.1 10*3/uL (ref 0.0–0.5)
Eosinophils Relative: 2 %
HCT: 32 % — ABNORMAL LOW (ref 36.0–46.0)
Hemoglobin: 10.6 g/dL — ABNORMAL LOW (ref 12.0–15.0)
Immature Granulocytes: 0 %
Lymphocytes Relative: 39 %
Lymphs Abs: 1.7 10*3/uL (ref 0.7–4.0)
MCH: 30.8 pg (ref 26.0–34.0)
MCHC: 33.1 g/dL (ref 30.0–36.0)
MCV: 93 fL (ref 80.0–100.0)
Monocytes Absolute: 0.5 10*3/uL (ref 0.1–1.0)
Monocytes Relative: 12 %
Neutro Abs: 2 10*3/uL (ref 1.7–7.7)
Neutrophils Relative %: 46 %
Platelets: 170 10*3/uL (ref 150–400)
RBC: 3.44 MIL/uL — ABNORMAL LOW (ref 3.87–5.11)
RDW: 13.1 % (ref 11.5–15.5)
WBC: 4.3 10*3/uL (ref 4.0–10.5)
nRBC: 0 % (ref 0.0–0.2)

## 2020-10-05 LAB — MAGNESIUM: Magnesium: 1.6 mg/dL — ABNORMAL LOW (ref 1.7–2.4)

## 2020-10-05 LAB — VITAMIN D 25 HYDROXY (VIT D DEFICIENCY, FRACTURES): Vit D, 25-Hydroxy: 34.81 ng/mL (ref 30–100)

## 2020-10-05 LAB — GLUCOSE, CAPILLARY
Glucose-Capillary: 137 mg/dL — ABNORMAL HIGH (ref 70–99)
Glucose-Capillary: 104 mg/dL — ABNORMAL HIGH (ref 70–99)
Glucose-Capillary: 110 mg/dL — ABNORMAL HIGH (ref 70–99)
Glucose-Capillary: 88 mg/dL (ref 70–99)

## 2020-10-05 LAB — BASIC METABOLIC PANEL
Anion gap: 6 (ref 5–15)
BUN: 12 mg/dL (ref 8–23)
CO2: 28 mmol/L (ref 22–32)
Calcium: 8.9 mg/dL (ref 8.9–10.3)
Chloride: 103 mmol/L (ref 98–111)
Creatinine, Ser: 0.92 mg/dL (ref 0.44–1.00)
GFR, Estimated: >60 mL/min (ref >60)
Glucose, Bld: 121 mg/dL — ABNORMAL HIGH (ref 70–99)
Potassium: 2.8 mmol/L — ABNORMAL LOW (ref 3.5–5.1)
Sodium: 137 mmol/L (ref 135–145)

## 2020-10-05 LAB — PHOSPHORUS: Phosphorus: 2.4 mg/dL — ABNORMAL LOW (ref 2.5–4.6)

## 2020-10-05 LAB — VITAMIN B12: Vitamin B-12: 611 pg/mL (ref 180–914)

## 2020-10-05 MED ORDER — K PHOS MONO-SOD PHOS DI & MONO 155-852-130 MG PO TABS
500.0000 mg | ORAL_TABLET | Freq: Once | ORAL | Status: AC
  Administered 2020-10-05: 500 mg via ORAL
  Filled 2020-10-05: qty 2

## 2020-10-05 MED ORDER — ENSURE ENLIVE PO LIQD
237.0000 mL | Freq: Two times a day (BID) | ORAL | Status: DC
  Administered 2020-10-05 – 2020-10-07 (×3): 237 mL via ORAL

## 2020-10-05 MED ORDER — THIAMINE HCL 100 MG PO TABS
100.0000 mg | ORAL_TABLET | Freq: Every day | ORAL | Status: DC
  Administered 2020-10-05 – 2020-10-07 (×3): 100 mg via ORAL
  Filled 2020-10-05 (×3): qty 1

## 2020-10-05 MED ORDER — MAGNESIUM SULFATE 4 GM/100ML IV SOLN
4.0000 g | Freq: Once | INTRAVENOUS | Status: AC
  Administered 2020-10-05: 4 g via INTRAVENOUS
  Filled 2020-10-05: qty 100

## 2020-10-05 MED ORDER — POTASSIUM CHLORIDE CRYS ER 20 MEQ PO TBCR
40.0000 meq | EXTENDED_RELEASE_TABLET | ORAL | Status: AC
Start: 2020-10-05 — End: 2020-10-05
  Administered 2020-10-05: 40 meq via ORAL
  Filled 2020-10-05: qty 2

## 2020-10-05 NOTE — Progress Notes (Signed)
PROGRESS NOTE  Becky [name]  DOB: 1955/05/20  PCP: [name], MD [id number]  DOA: 10/03/2020  LOS: 1 day  Hospital Day: 3   Chief Complaint  Patient presents with   Medical Clearance    Brief narrative: Becky [name] is a 65 y.o. female with PMH significant for DM2, HTN, Anorexia Nervosa, dissociative identity disorder, chronic pain syndrome, neuropathy, PTSD, chronic tremors  On 8/23, patient was taken to behavioral health by her sister with complaint of shaking, poor judgment.  Patient was referred to ED for medical clearance before she could get help with a psychiatrist. Patient complained of worsening fatigue, flareup of anorexia.  Denies suicidal or homicidal ideation.  Sister noted a general decline in the patient over the course of last 2 to 4 weeks.  In the ED, patient was hemodynamically stable. Labs with sodium 134, potassium 3.2, BUN/creatinine 40/2.58, baseline 1.1 few months ago.  Admitted to hospitalist service for further evaluation management   Subjective: Patient was seen and examined this morning.   Lying on bed.  Not in distress.  No new symptoms.  No nausea or vomiting since admission. Labs this morning with low potassium magnesium and phosphorus  Assessment/Plan: AKI -Prerenal AKI due to dehydration from poor oral intake and vomiting.  Creatinine was elevated to 2.58 on presentation.  Improved back to normal this morning with IV hydration.   Recent Labs    10/03/20 1949 10/04/20 0618 10/05/20 0200  BUN 40* 37* 12  CREATININE 2.58* 2.30* 0.92    Acute flareup of anorexia nervosa -Poor oral intake, vomiting leading to dehydration -Psychiatry consultation obtained.  Recommended to continue home medicines.  No need of inpatient psych at this time.  Type 2 diabetes mellitus -A1c 6 on 8/24 -Home meds include metformin -Currently on hold -Blood sugar level controlled with sliding scale insulin. Recent Labs  Lab 10/04/20 0822  10/04/20 1214 10/04/20 1701 10/04/20 2056 10/05/20 0810  GLUCAP 104* 113* 174* 110* 137*    Essential hypertension -Continue Bystolic.  ICD on hold because of AKI and hyponatremia  Hypokalemia/hypomagnesemia/hypophosphatemia -Left this morning with low potassium, magnesium and phosphorus level.  Oral and IV replacement ordered.  Recheck tomorrow.   Recent Labs  Lab 10/03/20 1949 10/04/20 0618 10/05/20 0200  K 3.2* 4.2 2.8*  MG 1.7  --  1.6*  PHOS 5.2*  --  2.4*    Hyponatremia -Improved with IV fluid. Recent Labs  Lab 10/03/20 1949 10/04/20 0618 10/05/20 0200  NA 134* 131* 137    Other psychiatric disorders Chronic tremors -Continue home meds per psychiatry service.  Mobility: PT eval ordered Code Status:   Code Status: Full Code  Nutritional status: Body mass index is 22.39 kg/m.     Diet:  Diet Order             Diet regular Room service appropriate? Yes; Fluid consistency: Thin  Diet effective now                  DVT prophylaxis:  heparin injection 5,000 Units Start: 10/04/20 0600   Antimicrobials: None Fluid: Currently on LR'@100'$  mill per hour.  Cut back to 50 mill per hour for next 24 hours. Consultants: Psychiatry consulted Family Communication: None at bedside  Status is: Inpatient  Remains inpatient appropriate because: Significantly low electrolyte levels today.  Needs IV and oral replacement and recheck tomorrow.  Dispo: The patient is from: Home  Anticipated d/c is to: Hopefully home in next 24 hours              Patient currently is not medically stable to d/c.   Difficult to place patient No     Infusions:   lactated ringers 50 mL/hr at 10/05/20 0842   magnesium sulfate bolus IVPB      Scheduled Meds:  benztropine  2 mg Oral BID   buPROPion  450 mg Oral q morning   DULoxetine  120 mg Oral Daily   heparin  5,000 Units Subcutaneous Q8H   insulin aspart  0-6 Units Subcutaneous TID WC   lurasidone  120 mg Oral  QHS   mometasone-formoterol  2 puff Inhalation BID   nebivolol  10 mg Oral Daily   pantoprazole  80 mg Oral Daily   phosphorus  500 mg Oral Once   potassium chloride  40 mEq Oral Q2H   QUEtiapine  100 mg Oral QHS    Antimicrobials: Anti-infectives (From admission, onward)    None       PRN meds: acetaminophen **OR** acetaminophen, albuterol, melatonin, ondansetron **OR** ondansetron (ZOFRAN) IV   Objective: Vitals:   10/05/20 0740 10/05/20 0813  BP:  105/70  Pulse:  63  Resp:  18  Temp:  99 F (37.2 C)  SpO2: 99% 97%    Intake/Output Summary (Last 24 hours) at 10/05/2020 0859 Last data filed at 10/04/2020 1810 Gross per 24 hour  Intake 1306.58 ml  Output --  Net 1306.58 ml   Filed Weights   10/03/20 1922  Weight: 52 kg   Weight change:  Body mass index is 22.39 kg/m.   Physical Exam: General exam: Pleasant, middle-aged Caucasian female.  Not in physical distress Skin: No rashes, lesions or ulcers. HEENT: Atraumatic, normocephalic, no obvious bleeding Lungs: Clear to auscultation bilaterally CVS: Regular rate and rhythm, no murmur GI/Abd soft, nontender, nondistended, bowel sound present CNS: Alert, awake, slow to respond, oriented to place Psychiatry: Depressed look Extremities: No pedal edema, no calf tenderness  Data Review: I have personally reviewed the laboratory data and studies available.  Recent Labs  Lab 10/03/20 1949 10/05/20 0200  WBC 8.9 4.3  NEUTROABS 6.6 2.0  HGB 12.3 10.6*  HCT 36.7 32.0*  MCV 93.1 93.0  PLT 311 170    Recent Labs  Lab 10/03/20 1949 10/04/20 0618 10/05/20 0200  NA 134* 131* 137  K 3.2* 4.2 2.8*  CL 96* 101 103  CO2 25 19* 28  GLUCOSE 115* 106* 121*  BUN 40* 37* 12  CREATININE 2.58* 2.30* 0.92  CALCIUM 9.3 8.2* 8.9  MG 1.7  --  1.6*  PHOS 5.2*  --  2.4*     F/u labs ordered Unresulted Labs (From admission, onward)     Start     Ordered   10/05/20 0500  CBC with Differential/Platelet  Daily,   R       10/04/20 1011   10/05/20 [id number]  Basic metabolic panel  Daily,   R      10/04/20 1011   10/04/20 0202  Resp Panel by RT-PCR (Flu A&B, Covid) Nasopharyngeal Swab  (Tier 2 - Symptomatic/asymptomatic with Precautions )  Once,   STAT       Question Answer Comment  Is this test for diagnosis or screening Screening   Symptomatic for COVID-19 as defined by CDC No   Hospitalized for COVID-19 No   Admitted to ICU for COVID-19 No   Previously tested for COVID-19 Yes  Resident in a congregate (group) care setting Unknown   Employed in healthcare setting Unknown   Pregnant No   Has patient completed COVID vaccination(s) (2 doses of Pfizer/Moderna 1 dose of [employer]) Unknown      10/04/20 0201   10/03/20 1935  Vitamin B1  Once,   STAT        10/03/20 1935            Signed, Terrilee Croak, MD [facility] 10/05/2020

## 2020-10-05 NOTE — Evaluation (Signed)
Occupational Therapy Evaluation Patient Details Name: Becky [name] MRN: [id number] DOB: January 13, 1956 Today's Date: 10/05/2020    History of Present Illness Pt is a 65yo female presenting to [facility] ED on 8/23, with complaints of shaking and confusion. Of note, sister brought pt to behavioral health and was told pt needed medical clearance so was taken to [facility]. Imaging negative for acute findings. PMH: Anorexia nervosa, HTN, DM, confusion, dissociative identity disorder, chronic pain syndrome, orthostatic hypotension.   Clinical Impression   Pt presented in bed and agreed to session. She was able to complete bed mobility with mod I, transfers with supervision to min guard due to "feeling shaky". Pt also reported in session when looking laterally some dizziness then reported she has some vertigo. She also mentioned about how taking care of her family is probably why she is here and it has been a lot of stress as they are starting to require increase in care. Pt currently with functional limitations due to the deficits listed below (see OT Problem List).  Pt will benefit from skilled OT to increase their safety and independence with ADL and functional mobility for ADL to facilitate discharge to venue listed below.   BP supine: 103/53 Sitting: 116/75 Standing:123/68     Follow Up Recommendations  Supervision - Intermittent;Other (comment) (depedning in psych consult)    Equipment Recommendations  Tub/shower seat    Recommendations for Other Services       Precautions / Restrictions Precautions Precautions: Fall Restrictions Weight Bearing Restrictions: No      Mobility Bed Mobility Overal bed mobility: Needs Assistance Bed Mobility: Supine to Sit;Sit to Supine     Supine to sit: Modified independent (Device/Increase time);HOB elevated Sit to supine: Modified independent (Device/Increase time);HOB elevated        Transfers Overall transfer level: Needs assistance Equipment  used: None Transfers: Sit to/from Stand Sit to Stand: Supervision              Balance Overall balance assessment: Needs assistance Sitting-balance support: No upper extremity supported Sitting balance-Leahy Scale: Good     Standing balance support: No upper extremity supported;During functional activity Standing balance-Leahy Scale: Fair Standing balance comment: Pt able to maintain static standing without UE support                           ADL either performed or assessed with clinical judgement   ADL Overall ADL's : Needs assistance/impaired Eating/Feeding: Independent;Sitting   Grooming: Wash/dry hands;Wash/dry face;Independent;Sitting   Upper Body Bathing: Supervision/ safety;Sitting   Lower Body Bathing: Supervison/ safety;Sit to/from stand;Cueing for safety;Cueing for sequencing   Upper Body Dressing : Supervision/safety;Cueing for safety;Cueing for sequencing;Sitting   Lower Body Dressing: Supervision/safety;Sit to/from stand   Toilet Transfer: Min guard;Ambulation   Toileting- Clothing Manipulation and Hygiene: Supervision/safety;Cueing for safety;Cueing for sequencing;Sit to/from stand   Tub/ Shower Transfer: Min guard;Cueing for safety;Cueing for sequencing;Ambulation     General ADL Comments: Pt's mobility limited due to potaium levels/dizziness     Vision Baseline Vision/History: 1 Wears glasses Ability to See in Adequate Light: 0 Adequate Patient Visual Report: No change from baseline       Perception     Praxis      Pertinent Vitals/Pain Pain Assessment: No/denies pain     Hand Dominance Right   Extremity/Trunk Assessment Upper Extremity Assessment Upper Extremity Assessment: Generalized weakness (pt reports though when taking care of family feeling straining in BUE and attempted to work  on OP PT but did not havce enough time) RUE Deficits / Details: Strength grossly 3+/5, ROM WFL RUE Coordination: decreased gross motor LUE  Deficits / Details: Strength grossly 3+/5, ROM WFL   Lower Extremity Assessment Lower Extremity Assessment: Generalized weakness   Cervical / Trunk Assessment Cervical / Trunk Assessment: Normal   Communication Communication Communication: No difficulties   Cognition Arousal/Alertness: Awake/alert Behavior During Therapy: Restless Overall Cognitive Status: No family/caregiver present to determine baseline cognitive functioning                                 General Comments: Pt reported they have been feeling stressed out with being a caregiver with taking care of the family and they are needing increase in level of assistance   General Comments       Exercises     Shoulder Instructions      Home Living Family/patient expects to be discharged to:: Private residence Living Arrangements: Alone Available Help at Discharge: Family;Available PRN/intermittently Type of Home: Apartment (condo) Home Access: Stairs to enter [employer] of Steps: 9 Entrance Stairs-Rails: Right;Left Home Layout: Multi-level Alternate Level Stairs-Number of Steps: 19 Alternate Level Stairs-Rails: Right;Left Bathroom Shower/Tub: Occupational psychologist: Standard     Home Equipment: None   Additional Comments: Pt has a dog, takes care of family multiple days a week      Prior Functioning/Environment Level of Independence: Independent                 OT Problem List: Decreased strength;Decreased activity tolerance;Impaired balance (sitting and/or standing);Decreased safety awareness      OT Treatment/Interventions: Self-care/ADL training;Therapeutic exercise;Therapeutic activities;Patient/family education;Balance training    OT Goals(Current goals can be found in the care plan section) Acute Rehab OT Goals Patient Stated Goal: to feel better OT Goal Formulation: With patient Time For Goal Achievement: 10/21/20 Potential to Achieve Goals: Good ADL  Goals Pt Will Perform Tub/Shower Transfer: Shower transfer;Independently;ambulating Pt/caregiver will Perform Home Exercise Program: Increased strength;Both right and left upper extremity;With written HEP provided Additional ADL Goal #1: Pt will be able to complete IADL tasks with no LOB to be able to assist with return as caregiver  OT Frequency: Min 2X/week   Barriers to D/C:            Co-evaluation              AM-PAC OT "6 Clicks" Daily Activity     Outcome Measure Help from another person eating meals?: None Help from another person taking care of personal grooming?: None Help from another person toileting, which includes using toliet, bedpan, or urinal?: A Little Help from another person bathing (including washing, rinsing, drying)?: A Little Help from another person to put on and taking off regular upper body clothing?: None Help from another person to put on and taking off regular lower body clothing?: A Little 6 Click Score: 21   End of Session Equipment Utilized During Treatment: Gait belt Nurse Communication: Mobility status  Activity Tolerance: Patient tolerated treatment well;Other (comment) (limited due to feeling dizzy in session) Patient left: in bed;with bed alarm set  OT Visit Diagnosis: Unsteadiness on feet (R26.81);Dizziness and giddiness (R42)                Time: [id number] OT Time Calculation (min): 25 min Charges:  OT General Charges $OT Visit: 1 Visit OT Evaluation $OT Eval Low Complexity: 1 Low  OT Treatments $Self Care/Home Management : 8-22 mins  Joeseph Amor OTR/L  Acute Rehab Services  [phone] office number [phone] pager number   Joeseph Amor 10/05/2020, 9:54 AM
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
## 2020-10-05 NOTE — Progress Notes (Signed)
Physical Therapy Treatment Patient Details Name: SHANTAYA MARIE MRN: [id number] DOB: [birth year] Today's Date: 10/05/2020    History of Present Illness Pt is a 65yo female presenting to [facility] ED on 8/23, with complaints of shaking and confusion. Of note, sister brought pt to behavioral health and was told pt needed medical clearance so was taken to [facility]. Imaging negative for acute findings. PMH: Anorexia nervosa, HTN, DM, confusion, dissociative identity disorder, chronic pain syndrome, orthostatic hypotension.    PT Comments    Pt is progressing toward her goals. Modified independent for bed mobility, supervision for transfers, min guard for ambulation and stairs. Tremor noticeably reduced during mobility tasks today especially with single UE support. Noted one instance of dizziness with head turned to the left, otherwise asymptomatic. Updated recommendations to HHPT with 24/7 supervision/assist as notes indicate pt not appropriate for inpatient psych. Educated about requiring 24/7 support and pt reports sister can stay with her. 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 We will continue to follow her acutely to promote independence with functional mobility.   Follow Up Recommendations  Supervision/Assistance - 24 hour;Home health PT (Per notes, not appropriate for inpatient psych.)     Equipment Recommendations  Rolling walker with 5" wheels    Recommendations for Other Services       Precautions / Restrictions Precautions Precautions: Fall Restrictions Weight Bearing Restrictions: No    Mobility  Bed Mobility Overal bed mobility: Modified Independent                  Transfers Overall transfer level: Needs assistance Equipment used: None Transfers: Sit to/from Stand Sit to Stand: Supervision         General transfer comment: Supervision for safety and line managemnt  Ambulation/Gait Ambulation/Gait  assistance: Min guard Gait Distance (Feet): 300 Feet Assistive device: IV Pole;None Gait Pattern/deviations: Step-through pattern;Decreased step length - right;Decreased step length - left;Narrow base of support Gait velocity: decreased   General Gait Details: Some shakiness and unsteadiness but much improved from evaluation. One standing rest break of ~30s. Improved balance noted when pt holding to IV pole or rail in hallway. Pt reporting increased comfort with UE support; educated about using RW at home for increase safety.   Stairs Stairs: Yes Stairs assistance: Min guard Stair Management: One rail Left;Step to pattern;Alternating pattern Number of Stairs: 6 General stair comments: Pt ascended stairs with alternating pattern using left railing; descended stairs with step-to pattern using right railing. Min guard for safety only.   Wheelchair Mobility    Modified Rankin (Stroke Patients Only)       Balance Overall balance assessment: Needs assistance Sitting-balance support: No upper extremity supported Sitting balance-Leahy Scale: Good     Standing balance support: No upper extremity supported;During functional activity;Single extremity supported Standing balance-Leahy Scale: Fair Standing balance comment: Pt able to walk and maintain static standing without UE support; utilized single UE support on IV pole and railing for portions of mobility tasks.                            Cognition Arousal/Alertness: Awake/alert Behavior During Therapy: Restless;Anxious Overall Cognitive Status: No family/caregiver present to determine baseline cognitive functioning                                 General Comments: Noted anxiety  throughout. Pt fidgety throughout mobility tasks.      Exercises      General Comments General comments (skin integrity, edema, etc.): Visual tracking evalution completed and no nystagmus or saccades noted. Pt reported one  episode of dizziness while walking and looking to the left to speak to PT.      Pertinent Vitals/Pain Pain Assessment: No/denies pain    Home Living                      Prior Function            PT Goals (current goals can now be found in the care plan section) Acute Rehab PT Goals Patient Stated Goal: to feel better PT Goal Formulation: With patient Time For Goal Achievement: 10/18/20 Potential to Achieve Goals: Good Progress towards PT goals: Progressing toward goals    Frequency    Min 3X/week      PT Plan Discharge plan needs to be updated    Co-evaluation              AM-PAC PT "6 Clicks" Mobility   Outcome Measure  Help needed turning from your back to your side while in a flat bed without using bedrails?: None Help needed moving from lying on your back to sitting on the side of a flat bed without using bedrails?: A Little Help needed moving to and from a bed to a chair (including a wheelchair)?: A Little Help needed standing up from a chair using your arms (e.g., wheelchair or bedside chair)?: A Little Help needed to walk in hospital room?: A Little Help needed climbing 3-5 steps with a railing? : A Little 6 Click Score: 19    End of Session Equipment Utilized During Treatment: Gait belt Activity Tolerance: Patient tolerated treatment well Patient left: in chair;with call bell/phone within reach;with chair alarm set;with nursing/sitter in room Nurse Communication: Mobility status PT Visit Diagnosis: Unsteadiness on feet (R26.81);Other abnormalities of gait and mobility (R26.89);Muscle weakness (generalized) (M62.81)     Time: [id number] PT Time Calculation (min) (ACUTE ONLY): 19 min  Charges:  $Gait Training: 8-22 mins                     Dawayne Cirri, SPT Dawayne Cirri 10/05/2020, 2:39 PM

## 2020-10-05 NOTE — Social Work (Signed)
CSW spoke to pt and gave her information about [facility]. Pt has printed brochure.   Emeterio Reeve, Petronila Clinical Social Worker [phone]

## 2020-10-06 DIAGNOSIS — E440 Moderate protein-calorie malnutrition: Secondary | ICD-10-CM | POA: Insufficient documentation

## 2020-10-06 LAB — CBC WITH DIFFERENTIAL/PLATELET
Abs Immature Granulocytes: 0.02 10*3/uL (ref 0.00–0.07)
Basophils Absolute: 0 10*3/uL (ref 0.0–0.1)
Basophils Relative: 1 %
Eosinophils Absolute: 0.2 10*3/uL (ref 0.0–0.5)
Eosinophils Relative: 4 %
HCT: 32.2 % — ABNORMAL LOW (ref 36.0–46.0)
Hemoglobin: 10.7 g/dL — ABNORMAL LOW (ref 12.0–15.0)
Immature Granulocytes: 1 %
Lymphocytes Relative: 35 %
Lymphs Abs: 1.5 10*3/uL (ref 0.7–4.0)
MCH: 31 pg (ref 26.0–34.0)
MCHC: 33.2 g/dL (ref 30.0–36.0)
MCV: 93.3 fL (ref 80.0–100.0)
Monocytes Absolute: 0.6 10*3/uL (ref 0.1–1.0)
Monocytes Relative: 13 %
Neutro Abs: 2.1 10*3/uL (ref 1.7–7.7)
Neutrophils Relative %: 46 %
Platelets: 194 10*3/uL (ref 150–400)
RBC: 3.45 MIL/uL — ABNORMAL LOW (ref 3.87–5.11)
RDW: 13.2 % (ref 11.5–15.5)
WBC: 4.4 10*3/uL (ref 4.0–10.5)
nRBC: 0 % (ref 0.0–0.2)

## 2020-10-06 LAB — BASIC METABOLIC PANEL
Anion gap: 7 (ref 5–15)
Anion gap: 9 (ref 5–15)
BUN: 6 mg/dL — ABNORMAL LOW (ref 8–23)
BUN: 5 mg/dL — ABNORMAL LOW (ref 8–23)
CO2: 25 mmol/L (ref 22–32)
CO2: 26 mmol/L (ref 22–32)
Calcium: 8.1 mg/dL — ABNORMAL LOW (ref 8.9–10.3)
Calcium: 8.2 mg/dL — ABNORMAL LOW (ref 8.9–10.3)
Chloride: 106 mmol/L (ref 98–111)
Chloride: 104 mmol/L (ref 98–111)
Creatinine, Ser: 0.74 mg/dL (ref 0.44–1.00)
Creatinine, Ser: 0.83 mg/dL (ref 0.44–1.00)
GFR, Estimated: >60 mL/min (ref >60)
GFR, Estimated: >60 mL/min (ref >60)
Glucose, Bld: 104 mg/dL — ABNORMAL HIGH (ref 70–99)
Glucose, Bld: 110 mg/dL — ABNORMAL HIGH (ref 70–99)
Potassium: 2.8 mmol/L — ABNORMAL LOW (ref 3.5–5.1)
Potassium: 3.7 mmol/L (ref 3.5–5.1)
Sodium: 138 mmol/L (ref 135–145)
Sodium: 139 mmol/L (ref 135–145)

## 2020-10-06 LAB — PHOSPHORUS: Phosphorus: 2.1 mg/dL — ABNORMAL LOW (ref 2.5–4.6)

## 2020-10-06 LAB — GLUCOSE, CAPILLARY
Glucose-Capillary: 153 mg/dL — ABNORMAL HIGH (ref 70–99)
Glucose-Capillary: 163 mg/dL — ABNORMAL HIGH (ref 70–99)
Glucose-Capillary: 108 mg/dL — ABNORMAL HIGH (ref 70–99)
Glucose-Capillary: 88 mg/dL (ref 70–99)

## 2020-10-06 LAB — MAGNESIUM: Magnesium: 1.9 mg/dL (ref 1.7–2.4)

## 2020-10-06 LAB — COPPER, SERUM: Copper: 113 ug/dL (ref 80–158)

## 2020-10-06 LAB — VITAMIN B1: Vitamin B1 (Thiamine): 220.3 nmol/L — ABNORMAL HIGH (ref 66.5–200.0)

## 2020-10-06 LAB — ZINC: Zinc: 68 ug/dL (ref 44–115)

## 2020-10-06 MED ORDER — ADULT MULTIVITAMIN W/MINERALS CH
1.0000 | ORAL_TABLET | Freq: Every day | ORAL | Status: DC
  Administered 2020-10-06 – 2020-10-07 (×2): 1 via ORAL
  Filled 2020-10-06 (×2): qty 1

## 2020-10-06 MED ORDER — K PHOS MONO-SOD PHOS DI & MONO 155-852-130 MG PO TABS
500.0000 mg | ORAL_TABLET | Freq: Two times a day (BID) | ORAL | Status: DC
  Administered 2020-10-06 (×2): 500 mg via ORAL
  Filled 2020-10-06 (×3): qty 2

## 2020-10-06 MED ORDER — POTASSIUM CHLORIDE CRYS ER 20 MEQ PO TBCR
40.0000 meq | EXTENDED_RELEASE_TABLET | ORAL | Status: AC
  Administered 2020-10-06 (×2): 40 meq via ORAL
  Filled 2020-10-06 (×2): qty 2

## 2020-10-06 NOTE — Care Management Important Message (Signed)
Important Message  Patient Details  Name: Becky [name] MRN: [id number] Date of Birth: 03-27-1955   Medicare Important Message Given:  Yes     Orbie Pyo 10/06/2020, 4:45 PM

## 2020-10-06 NOTE — Progress Notes (Signed)
Physical Therapy Treatment Patient Details Name: Becky [name] MRN: [id number] DOB: 18-Jul-1955 Today's Date: 10/06/2020    History of Present Illness Pt is a 65yo female presenting to [facility] ED on 8/23, with complaints of shaking and confusion. Of note, sister brought pt to behavioral health and was told pt needed medical clearance so was taken to [facility]. Imaging negative for acute findings. PMH: Anorexia nervosa, HTN, DM, confusion, dissociative identity disorder, chronic pain syndrome, orthostatic hypotension.   PT Comments    Pt is progressing well toward her goals. Pt is modified independent for bed mobility, min guard to supervision for all other tasks today. Pt ambulated ~169f with RW and demonstrated increased stability and comfort; educated pt on use of RW upon discharge for safety. Practiced higher-level balance activities. Pt continues to demonstrate decreased balance especially with single leg stance activities. Current recommendations appropriate. We will continue to follow her acutely to promote independence with functional mobililty.   Follow Up Recommendations  Supervision/Assistance - 24 hour;Home health PT (Pt reports her parents had a [facility])     Equipment Recommendations  Rolling walker with 5" wheels ([employer]    Recommendations for Other Services       Precautions / Restrictions Precautions Precautions: Fall Restrictions Weight Bearing Restrictions: No    Mobility  Bed Mobility Overal bed mobility: Modified Independent                  Transfers Overall transfer level: Needs assistance Equipment used: None Transfers: Sit to/from Stand Sit to Stand: Supervision         General transfer comment: Supervision for safety only  Ambulation/Gait Ambulation/Gait assistance: Min guard;Supervision Gait Distance (Feet): 150 Feet Assistive device: Rolling walker (2 wheeled) Gait Pattern/deviations:  Step-through pattern;Decreased step length - right;Decreased step length - left;Narrow base of support Gait velocity: decreased   General Gait Details: Pt utilized RW for ambulation tasks today and demonstrated increased steadiness and comfort. Moderate verbal cues relaxed grip as pt was holding very tight. Noted that when pt looked to the left, pt drifted toward that direction.   Stairs             Wheelchair Mobility    Modified Rankin (Stroke Patients Only)       Balance Overall balance assessment: Needs assistance Sitting-balance support: No upper extremity supported Sitting balance-Leahy Scale: Good     Standing balance support: Bilateral upper extremity supported;During functional activity;No upper extremity supported Standing balance-Leahy Scale: Fair Standing balance comment: Able to maintain static standing without UE support Single Leg Stance - Right Leg: 10 Single Leg Stance - Left Leg: 3     Rhomberg - Eyes Opened: 10 (on both sides)   High level balance activites: Head turns High Level Balance Comments: Pt reports increased dizziness looking to the left compared to right when performing static standing head turns at EOB (5x to each side) Standardized Balance Assessment Standardized Balance Assessment : Dynamic Gait Index   Dynamic Gait Index Level Surface: Mild Impairment Change in Gait Speed: Moderate Impairment Gait with Horizontal Head Turns: Moderate Impairment Gait with Vertical Head Turns: Moderate Impairment Gait and Pivot Turn: Mild Impairment Step Over Obstacle: Moderate Impairment Step Around Obstacles: Mild Impairment Steps: Mild Impairment Total Score: 12      Cognition Arousal/Alertness: Awake/alert Behavior During Therapy: Restless;Anxious Overall Cognitive Status: No family/caregiver present to determine baseline cognitive functioning  General Comments: Noted anxiety throughout. Pt  fidgety throughout mobility tasks.      Exercises Other Exercises Other Exercises: Standing 3-way hip at EOB, with hand held hover for stability. 2x10 bilaterally. Pt required HHA with max verbal cuing for slow and controlled. Other Exercises: Pt completed 10 sit to stands from bed; 5 with use of BUE; 5 without use of UEs.    General Comments General comments (skin integrity, edema, etc.): Pt generally looks improved and has increased energy with decreased shakiness during session      Pertinent Vitals/Pain Pain Assessment: No/denies pain    Home Living                      Prior Function            PT Goals (current goals can now be found in the care plan section) Acute Rehab PT Goals Patient Stated Goal: to feel better PT Goal Formulation: With patient Time For Goal Achievement: 10/18/20 Potential to Achieve Goals: Good Progress towards PT goals: Progressing toward goals    Frequency    Min 3X/week      PT Plan Current plan remains appropriate    Co-evaluation              AM-PAC PT "6 Clicks" Mobility   Outcome Measure  Help needed turning from your back to your side while in a flat bed without using bedrails?: None Help needed moving from lying on your back to sitting on the side of a flat bed without using bedrails?: A Little Help needed moving to and from a bed to a chair (including a wheelchair)?: A Little Help needed standing up from a chair using your arms (e.g., wheelchair or bedside chair)?: A Little Help needed to walk in hospital room?: A Little Help needed climbing 3-5 steps with a railing? : A Little 6 Click Score: 19    End of Session Equipment Utilized During Treatment: Gait belt Activity Tolerance: Patient tolerated treatment well Patient left: in chair;with call bell/phone within reach;with chair alarm set;with nursing/sitter in room Nurse Communication: Mobility status PT Visit Diagnosis: Unsteadiness on feet (R26.81);Other  abnormalities of gait and mobility (R26.89);Muscle weakness (generalized) (M62.81)     Time: [id number] PT Time Calculation (min) (ACUTE ONLY): 26 min  Charges:  $Gait Training: 8-22 mins $Therapeutic Exercise: 8-22 mins                     Dawayne Cirri, SPT Dawayne Cirri 10/06/2020, 4:33 PM

## 2020-10-06 NOTE — Progress Notes (Addendum)
Nutrition Follow-up  DOCUMENTATION CODES:   Non-severe (moderate) malnutrition in context of chronic illness  INTERVENTION:   Pt remains at [facility] for refeeding syndrome given long standing hx of anorexia nervosa; based on electrolyte values this AM, pt is likely currently refeeding. Electrolytes are being replaced. Recommend checking BID until electrolytes are consistently stable.  - Pt to continue to follow up with outpatient RD at discharge  - Ensure Enlive po BID, each supplement provides 350 kcal and 20 grams of protein  - RD obtained pt meal orders for lunch today through breakfast on Monday 8/29 in the event that pt is still admitted at that time  - Continue MVI with minerals  - Continue thiamine 100 mg daily x 7 days  - Awaiting results of vitamin A, zinc, and copper labs  NUTRITION DIAGNOSIS:   Moderate Malnutrition related to social / environmental circumstances, chronic illness (anorexia nervosa) as evidenced by moderate fat depletion, moderate muscle depletion, severe muscle depletion, energy intake < 75% for > or equal to 1 month.  New diagnosis after completion of NFPE  GOAL:   Patient will meet greater than or equal to 90% of their needs  Unmet  MONITOR:   PO intake, Supplement acceptance, Labs, Weight trends  REASON FOR ASSESSMENT:   Consult Assessment of nutrition requirement/status  ASSESSMENT:   65 yo female admitted with AKI, confusion. PMH includes anorexia nervosa, HTN, DM, gastroparesis requiring botox injections, GERD with nissen fundoplication, hx of PEG tube (removed 1996),  dissociative identity disorder, PTSD, chronic tremors  Spoke with pt at bedside. Pt sitting up in bed at time of RD visit. Pt mentation appears to remain altered. Pt initially could not recall what she had for breakfast this morning and could not recall nutrition goals discussed yesterday with RD. Pt also confused her outpatient RD with her outpatient therapist on  multiple occasions. Pt's speech continued to appear tangential.  Pt continues to report that she does not like hospital food. Pt tried the veggie burger that RD had ordered last night for dinner but did not like it. Pt reports that she tried the chips but also did not like these and reports that they "tasted like mold." Pt states that she did eat some of the blueberry muffin this morning for breakfast but did not eat any of the oatmeal or banana.  Noted that pt had already placed meal orders through breakfast tomorrow. For lunch today, pt had ordered the fresh fruit plate with cottage cheese and 2 waters. For dinner today, pt had ordered 2 side salads with 4 packs of Ranch dressing. RD has adjusted pt's meal orders to include more food and include carbs, protein, and dietary fat at each meal.  RD provided pt with an apple juice, graham crackers, and peanut butter per her request. RD opened apple juice and pt took two sips during RD visit. RD also opened graham crackers and peanut butter but pt did not eat any of this during RD visit.  RD encouraged pt to consume Ensure supplements. Pt states that she took a few more sips of the Ensure High Protein yesterday.  RD communicated multiple concerns with MD. Pt taking in very minimal PO with poor social support.  Vitamin/Mineral Profile:  Thiamine B1: 220.3 (H) Vitamin B12: 611 (WNL) Vitamin A: pending Vitamin D: 34.81 (lower end of WNL) Copper: pending Zinc: pending  Medications reviewed and include: Ensure Enlive BID, SSI, MVI with minerals, protonix, K phos neutral, thiamine  Labs reviewed: potassium  2.8, phosphorus 2.1 CBG's: 88-163 x 24 hours  NUTRITION - FOCUSED PHYSICAL EXAM:  Flowsheet Row Most Recent Value  Orbital Region Moderate depletion  Upper Arm Region Moderate depletion  Thoracic and Lumbar Region Unable to assess  Buccal Region Mild depletion  Temple Region Mild depletion  Clavicle Bone Region Moderate depletion  Clavicle  and Acromion Bone Region Severe depletion  Scapular Bone Region Unable to assess  Dorsal Hand Moderate depletion  Patellar Region Unable to assess  Anterior Thigh Region Unable to assess  Posterior Calf Region Unable to assess  Edema (RD Assessment) None  Hair Reviewed  [dry, brittle]  Eyes Reviewed  Mouth Reviewed  Skin Reviewed  [dry]  Nails Reviewed  [vertical ridges]       Diet Order:   Diet Order             Diet regular Room service appropriate? Yes; Fluid consistency: Thin  Diet effective now                   EDUCATION NEEDS:   Education needs have been addressed  Skin:  Skin Assessment: Reviewed RN Assessment  Last BM:  10/05/20  Height:   Ht Readings from Last 1 Encounters:  10/03/20 5' (1.524 m)    Weight:   Wt Readings from Last 1 Encounters:  10/03/20 52 kg    BMI:  Body mass index is 22.39 kg/m.  Estimated Nutritional Needs:   Kcal:  1500-1700 kcals  Protein:  65-75 grams  Fluid:  >/= 1.5 L    Gustavus Bryant, MS, RD, LDN Inpatient Clinical Dietitian Please see AMiON for contact information.

## 2020-10-06 NOTE — Progress Notes (Signed)
PROGRESS NOTE  Becky [name]  DOB: 03-29-1955  PCP: [name], MD IW:[id number]  DOA: 10/03/2020  LOS: 2 days  Hospital Day: 4   Chief Complaint  Patient presents with   Medical Clearance    Brief narrative: Becky [name] is a 65 y.o. female with PMH significant for DM2, HTN, Anorexia Nervosa, dissociative identity disorder, chronic pain syndrome, neuropathy, PTSD, chronic tremors  On 8/23, patient was taken to behavioral health by her sister with complaint of shaking, poor judgment.  Patient was referred to ED for medical clearance before she could get help with a psychiatrist. Patient complained of worsening fatigue, flareup of anorexia.  Denies suicidal or homicidal ideation.  Sister noted a general decline in the patient over the course of last 2 to 4 weeks.  In the ED, patient was hemodynamically stable. Labs with sodium 134, potassium 3.2, BUN/creatinine 40/2.58, baseline 1.1 few months ago.  Admitted to hospitalist service for further evaluation management   Subjective: Patient was seen and examined this morning.   Lying on bed.  Not in distress.  She has been eating only tiny bites.  Nutrition consult appreciated.  Electrolytes remain low today as well.  Assessment/Plan: AKI -Prerenal AKI due to dehydration from poor oral intake and vomiting.  Creatinine was elevated to 2.58 on presentation.  Improved back to norma with IV hydration.   Recent Labs    10/03/20 1949 10/04/20 0618 10/05/20 0200 10/06/20 0207  BUN 40* 37* 12 6*  CREATININE 2.58* 2.30* 0.92 0.74    Acute flareup of anorexia nervosa -Poor oral intake, vomiting leading to dehydration -Psychiatry consultation obtained.  Recommended to continue home medicines.  No need of inpatient psych at this time.  Moderate malnutrition -Dietary consult appreciated.  Patient has very poor appetite.  Supplements and multivitamins suggested.   Type 2 diabetes mellitus -A1c 6 on 8/24 -Home meds  include metformin -Currently on hold -Blood sugar level controlled with sliding scale insulin. Recent Labs  Lab 10/05/20 1132 10/05/20 1635 10/05/20 2055 10/06/20 0758 10/06/20 1138  GLUCAP 104* 110* 88 153* 163*    Essential hypertension -Continue Bystolic.  ICD on hold because of AKI and hyponatremia  Hypokalemia/hypomagnesemia/hypophosphatemia -Electrolytes level still remain low this morning.  Aggressive IV and oral replacement ordered.  Recheck potassium level this morning. Recent Labs  Lab 10/03/20 1949 10/04/20 0618 10/05/20 0200 10/06/20 0207  K 3.2* 4.2 2.8* 2.8*  MG 1.7  --  1.6* 1.9  PHOS 5.2*  --  2.4* 2.1*    Other psychiatric disorders Chronic tremors -Continue home meds per psychiatry service.  Mobility: PT eval ordered Code Status:   Code Status: Full Code  Nutritional status: Body mass index is 22.39 kg/m. Nutrition Problem: Moderate Malnutrition Etiology: social / environmental circumstances, chronic illness (anorexia nervosa) Signs/Symptoms: moderate fat depletion, moderate muscle depletion, severe muscle depletion, energy intake < 75% for > or equal to 1 month Diet:  Diet Order             Diet regular Room service appropriate? Yes; Fluid consistency: Thin  Diet effective now                  DVT prophylaxis:  heparin injection 5,000 Units Start: 10/04/20 0600   Antimicrobials: None Fluid: Can stop IV fluid today.. Consultants: Psychiatry consult appreciated Family Communication: None at bedside  Status is: Inpatient  Remains inpatient appropriate because: Significantly low electrolyte levels today.  Needs IV and oral replacement and recheck tomorrow.  Dispo: The patient is from: Home              Anticipated d/c is to: Hopefully home in next 1 to 2 days              Patient currently is not medically stable to d/c.   Difficult to place patient No     Infusions:     Scheduled Meds:  benztropine  2 mg Oral BID    buPROPion  450 mg Oral q morning   DULoxetine  120 mg Oral Daily   feeding supplement  237 mL Oral BID WC   heparin  5,000 Units Subcutaneous Q8H   insulin aspart  0-6 Units Subcutaneous TID WC   lurasidone  120 mg Oral QHS   mometasone-formoterol  2 puff Inhalation BID   multivitamin with minerals  1 tablet Oral Daily   nebivolol  10 mg Oral Daily   pantoprazole  80 mg Oral Daily   phosphorus  500 mg Oral BID   QUEtiapine  100 mg Oral QHS   thiamine  100 mg Oral Daily    Antimicrobials: Anti-infectives (From admission, onward)    None       PRN meds: acetaminophen **OR** acetaminophen, albuterol, melatonin, ondansetron **OR** ondansetron (ZOFRAN) IV   Objective: Vitals:   10/06/20 0801 10/06/20 1149  BP: 105/77 (!) 123/93  Pulse: 68 66  Resp: 20 20  Temp: 98.5 F (36.9 C) 98.6 F (37 C)  SpO2: 98% 98%    Intake/Output Summary (Last 24 hours) at 10/06/2020 1424 Last data filed at 10/06/2020 0900 Gross per 24 hour  Intake 3093.89 ml  Output --  Net 3093.89 ml    Filed Weights   10/03/20 1922  Weight: 52 kg   Weight change:  Body mass index is 22.39 kg/m.   Physical Exam: General exam: Pleasant, middle-aged Caucasian female.  Not in physical distress Skin: No rashes, lesions or ulcers. HEENT: Atraumatic, normocephalic, no obvious bleeding Lungs: Clear to auscultation bilaterally CVS: Regular rate and rhythm, no murmur GI/Abd soft, nontender, nondistended, bowel sound present CNS: Alert, awake, slow to respond, oriented to place Psychiatry: Depressed look Extremities: No pedal edema, no calf tenderness  Data Review: I have personally reviewed the laboratory data and studies available.  Recent Labs  Lab 10/03/20 1949 10/05/20 0200 10/06/20 0207  WBC 8.9 4.3 4.4  NEUTROABS 6.6 2.0 2.1  HGB 12.3 10.6* 10.7*  HCT 36.7 32.0* 32.2*  MCV 93.1 93.0 93.3  PLT 311 170 194    Recent Labs  Lab 10/03/20 1949 10/04/20 0618 10/05/20 0200 10/06/20 0207   NA 134* 131* 137 138  K 3.2* 4.2 2.8* 2.8*  CL 96* 101 103 106  CO2 25 19* 28 25  GLUCOSE 115* 106* 121* 104*  BUN 40* 37* 12 6*  CREATININE 2.58* 2.30* 0.92 0.74  CALCIUM 9.3 8.2* 8.9 8.1*  MG 1.7  --  1.6* 1.9  PHOS 5.2*  --  2.4* 2.1*     F/u labs ordered Unresulted Labs (From admission, onward)     Start     Ordered   10/07/20 0500  Phosphorus  Tomorrow morning,   R        10/06/20 0841   10/06/20 [id number]  Basic metabolic panel  Once-Timed,   TIMED        10/06/20 0841   10/05/20 1447  Zinc  Once,   R        10/05/20 1447   10/05/20 1447  Copper, serum  Once,   R        10/05/20 1447   10/05/20 1434  Vitamin A  Once,   R        10/05/20 1437   10/05/20 0500  CBC with Differential/Platelet  Daily,   R      10/04/20 1011   10/05/20 [id number]  Basic metabolic panel  Daily,   R      10/04/20 1011   10/04/20 0202  Resp Panel by RT-PCR (Flu A&B, Covid) Nasopharyngeal Swab  (Tier 2 - Symptomatic/asymptomatic with Precautions )  Once,   STAT       Question Answer Comment  Is this test for diagnosis or screening Screening   Symptomatic for COVID-19 as defined by CDC No   Hospitalized for COVID-19 No   Admitted to ICU for COVID-19 No   Previously tested for COVID-19 Yes   Resident in a congregate (group) care setting Unknown   Employed in healthcare setting Unknown   Pregnant No   Has patient completed COVID vaccination(s) (2 doses of Pfizer/Moderna 1 dose of [employer]) Unknown      10/04/20 0201            Signed, Terrilee Croak, MD [facility] 10/06/2020

## 2020-10-07 LAB — BASIC METABOLIC PANEL
Anion gap: 4 — ABNORMAL LOW (ref 5–15)
BUN: 5 mg/dL — ABNORMAL LOW (ref 8–23)
CO2: 30 mmol/L (ref 22–32)
Calcium: 9 mg/dL (ref 8.9–10.3)
Chloride: 107 mmol/L (ref 98–111)
Creatinine, Ser: 0.81 mg/dL (ref 0.44–1.00)
GFR, Estimated: >60 mL/min (ref >60)
Glucose, Bld: 122 mg/dL — ABNORMAL HIGH (ref 70–99)
Potassium: 4.7 mmol/L (ref 3.5–5.1)
Sodium: 141 mmol/L (ref 135–145)

## 2020-10-07 LAB — GLUCOSE, CAPILLARY
Glucose-Capillary: 114 mg/dL — ABNORMAL HIGH (ref 70–99)
Glucose-Capillary: 90 mg/dL (ref 70–99)

## 2020-10-07 LAB — PHOSPHORUS: Phosphorus: 5 mg/dL — ABNORMAL HIGH (ref 2.5–4.6)

## 2020-10-07 LAB — CBC WITH DIFFERENTIAL/PLATELET
Abs Immature Granulocytes: 0.02 10*3/uL (ref 0.00–0.07)
Basophils Absolute: 0 10*3/uL (ref 0.0–0.1)
Basophils Relative: 1 %
Eosinophils Absolute: 0.1 10*3/uL (ref 0.0–0.5)
Eosinophils Relative: 3 %
HCT: 33 % — ABNORMAL LOW (ref 36.0–46.0)
Hemoglobin: 10.8 g/dL — ABNORMAL LOW (ref 12.0–15.0)
Immature Granulocytes: 1 %
Lymphocytes Relative: 35 %
Lymphs Abs: 1.5 10*3/uL (ref 0.7–4.0)
MCH: 31.5 pg (ref 26.0–34.0)
MCHC: 32.7 g/dL (ref 30.0–36.0)
MCV: 96.2 fL (ref 80.0–100.0)
Monocytes Absolute: 0.6 10*3/uL (ref 0.1–1.0)
Monocytes Relative: 13 %
Neutro Abs: 2 10*3/uL (ref 1.7–7.7)
Neutrophils Relative %: 47 %
Platelets: 181 10*3/uL (ref 150–400)
RBC: 3.43 MIL/uL — ABNORMAL LOW (ref 3.87–5.11)
RDW: 13.4 % (ref 11.5–15.5)
WBC: 4.3 10*3/uL (ref 4.0–10.5)
nRBC: 0 % (ref 0.0–0.2)

## 2020-10-07 LAB — VITAMIN A: Vitamin A (Retinoic Acid): 27.6 ug/dL (ref 22.0–69.5)

## 2020-10-07 MED ORDER — ENSURE ENLIVE PO LIQD: 237.0000 mL | Freq: Two times a day (BID) | ORAL | 12 refills | Status: DC

## 2020-10-07 MED ORDER — ADULT MULTIVITAMIN W/MINERALS CH: 1.0000 | ORAL_TABLET | Freq: Every day | ORAL | 0 refills | Status: AC

## 2020-10-07 MED ORDER — THIAMINE HCL 100 MG PO TABS: 100.0000 mg | ORAL_TABLET | Freq: Every day | ORAL | 0 refills | Status: AC
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
## 2020-10-07 NOTE — TOC Transition Note (Addendum)
Transition of Care [facility]) - CM/SW Discharge Note   Patient Details  Name: Becky [name] MRN: [id number] Date of Birth: Jul 09, 1955  [facility] CM/SW Contact:  Bartholomew Crews, RN Phone Number: [phone] 10/07/2020, 11:57 AM   Clinical Narrative:     Spoke with patient at the bedside to discuss transition home. She stated that her sister will pick her up as soon as she calls her to say she is ready. Discussed HH PT recommendations, patient is agreeable. [place] agency preference is [facility]. Referral accepted. Agreeable to youth-size RW, referral to [employer] for delivery to the home. No further TOC needs identified.   Update: Patient updated CM that she would be staying with her sister at [place].  Final next level of care: [place] Barriers to Discharge: No Barriers Identified   Patient Goals and CMS Choice Patient states their goals for this hospitalization and ongoing recovery are:: return home CMS Medicare.gov Compare Post Acute Care list provided to:: Patient Choice offered to / list presented to : Patient  Discharge Placement                       Discharge Plan and Services                DME Arranged: Gilford Rile DME Agency: [employer] Date DME Agency Contacted: 10/07/20 Time DME Agency Contacted: [phone] Representative spoke with at DME Agency: Brenton Grills HH Arranged: PT [place]: [place] Date [place]: 10/07/20 Time [place]: 1157 Representative spoke with at [place]: [place] ([place]) Interventions     Readmission Risk Interventions No flowsheet data found.

## 2020-10-07 NOTE — Progress Notes (Signed)
Discharge instructions reviewed with pt and instructed on where to pick up prescriptions. Pt verbalized understanding and had no questions.  Pt discharged in stable condition via wheelchair with sister.  Eliezer [place]

## 2020-10-12 ENCOUNTER — Other Ambulatory Visit: Payer: Self-pay

## 2020-10-12 ENCOUNTER — Telehealth: Payer: Medicare Other | Admitting: Family Medicine

## 2020-10-14 ENCOUNTER — Other Ambulatory Visit: Payer: Self-pay

## 2020-10-14 ENCOUNTER — Encounter (HOSPITAL_COMMUNITY): Payer: Self-pay | Admitting: Emergency Medicine

## 2020-10-14 ENCOUNTER — Emergency Department (HOSPITAL_COMMUNITY)
Admission: EM | Admit: 2020-10-14 | Discharge: 2020-10-16 | Disposition: A | Discharge status: Home / Self Care | Payer: Medicare Other | Attending: Emergency Medicine | Admitting: Emergency Medicine

## 2020-10-14 DIAGNOSIS — Z79899 Other long term (current) drug therapy: Secondary | ICD-10-CM | POA: Insufficient documentation

## 2020-10-14 DIAGNOSIS — F32A Depression, unspecified: Secondary | ICD-10-CM

## 2020-10-14 DIAGNOSIS — F329 Major depressive disorder, single episode, unspecified: Secondary | ICD-10-CM | POA: Insufficient documentation

## 2020-10-14 DIAGNOSIS — E119 Type 2 diabetes mellitus without complications: Secondary | ICD-10-CM | POA: Diagnosis not present

## 2020-10-14 DIAGNOSIS — Z7984 Long term (current) use of oral hypoglycemic drugs: Secondary | ICD-10-CM | POA: Diagnosis not present

## 2020-10-14 DIAGNOSIS — J4540 Moderate persistent asthma, uncomplicated: Secondary | ICD-10-CM | POA: Diagnosis not present

## 2020-10-14 DIAGNOSIS — F4481 Dissociative identity disorder: Secondary | ICD-10-CM | POA: Diagnosis present

## 2020-10-14 DIAGNOSIS — F332 Major depressive disorder, recurrent severe without psychotic features: Secondary | ICD-10-CM | POA: Diagnosis not present

## 2020-10-14 DIAGNOSIS — Y900 Blood alcohol level of less than 20 mg/100 ml: Secondary | ICD-10-CM | POA: Diagnosis not present

## 2020-10-14 DIAGNOSIS — Z87891 Personal history of nicotine dependence: Secondary | ICD-10-CM | POA: Diagnosis not present

## 2020-10-14 DIAGNOSIS — F419 Anxiety disorder, unspecified: Secondary | ICD-10-CM | POA: Diagnosis present

## 2020-10-14 DIAGNOSIS — F411 Generalized anxiety disorder: Secondary | ICD-10-CM | POA: Diagnosis present

## 2020-10-14 DIAGNOSIS — I10 Essential (primary) hypertension: Secondary | ICD-10-CM | POA: Insufficient documentation

## 2020-10-14 LAB — CBC
HCT: 36.2 % (ref 36.0–46.0)
Hemoglobin: 11.5 g/dL — ABNORMAL LOW (ref 12.0–15.0)
MCH: 31.4 pg (ref 26.0–34.0)
MCHC: 31.8 g/dL (ref 30.0–36.0)
MCV: 98.9 fL (ref 80.0–100.0)
Platelets: 244 10*3/uL (ref 150–400)
RBC: 3.66 MIL/uL — ABNORMAL LOW (ref 3.87–5.11)
RDW: 13.2 % (ref 11.5–15.5)
WBC: 5.1 10*3/uL (ref 4.0–10.5)
nRBC: 0 % (ref 0.0–0.2)

## 2020-10-14 LAB — COMPREHENSIVE METABOLIC PANEL
ALT: 12 U/L (ref 0–44)
AST: 16 U/L (ref 15–41)
Albumin: 3.4 g/dL — ABNORMAL LOW (ref 3.5–5.0)
Alkaline Phosphatase: 50 U/L (ref 38–126)
Anion gap: 8 (ref 5–15)
BUN: 13 mg/dL (ref 8–23)
CO2: 25 mmol/L (ref 22–32)
Calcium: 9.1 mg/dL (ref 8.9–10.3)
Chloride: 101 mmol/L (ref 98–111)
Creatinine, Ser: 1.22 mg/dL — ABNORMAL HIGH (ref 0.44–1.00)
GFR, Estimated: 50 mL/min — ABNORMAL LOW (ref >60)
Glucose, Bld: 80 mg/dL (ref 70–99)
Potassium: 3.7 mmol/L (ref 3.5–5.1)
Sodium: 134 mmol/L — ABNORMAL LOW (ref 135–145)
Total Bilirubin: 0.6 mg/dL (ref 0.3–1.2)
Total Protein: 5.9 g/dL — ABNORMAL LOW (ref 6.5–8.1)

## 2020-10-14 LAB — ETHANOL: Alcohol, Ethyl (B): <10 mg/dL (ref <10)

## 2020-10-14 LAB — ACETAMINOPHEN LEVEL: Acetaminophen (Tylenol), Serum: <10 ug/mL — ABNORMAL LOW (ref 10–30)

## 2020-10-14 LAB — SALICYLATE LEVEL: Salicylate Lvl: <7 mg/dL — ABNORMAL LOW (ref 7.0–30.0)

## 2020-10-14 MED ORDER — DULOXETINE HCL 60 MG PO CPEP: 120.0000 mg | ORAL_CAPSULE | Freq: Every day | ORAL | Status: DC

## 2020-10-14 MED ORDER — ALBUTEROL SULFATE HFA 108 (90 BASE) MCG/ACT IN AERS: 2.0000 | INHALATION_SPRAY | Freq: Four times a day (QID) | RESPIRATORY_TRACT | Status: DC | PRN

## 2020-10-14 MED ORDER — THIAMINE HCL 100 MG PO TABS
100.0000 mg | ORAL_TABLET | Freq: Every day | ORAL | Status: DC
  Administered 2020-10-15 – 2020-10-16 (×2): 100 mg via ORAL
  Filled 2020-10-14 (×2): qty 1

## 2020-10-14 MED ORDER — THIAMINE HCL 100 MG PO TABS: 100.0000 mg | ORAL_TABLET | Freq: Every day | ORAL | Status: DC

## 2020-10-14 MED ORDER — ADULT MULTIVITAMIN W/MINERALS CH
1.0000 | ORAL_TABLET | Freq: Every day | ORAL | Status: DC
  Administered 2020-10-15 – 2020-10-16 (×2): 1 via ORAL
  Filled 2020-10-14 (×2): qty 1

## 2020-10-14 MED ORDER — ADULT MULTIVITAMIN W/MINERALS CH: 1.0000 | ORAL_TABLET | Freq: Every day | ORAL | Status: DC

## 2020-10-14 MED ORDER — METFORMIN HCL ER 500 MG PO TB24: 500.0000 mg | ORAL_TABLET | Freq: Every evening | ORAL | Status: DC

## 2020-10-14 MED ORDER — BENZTROPINE MESYLATE 1 MG PO TABS
2.0000 mg | ORAL_TABLET | Freq: Two times a day (BID) | ORAL | Status: DC
  Administered 2020-10-15 – 2020-10-16 (×4): 2 mg via ORAL
  Filled 2020-10-14: qty 1
  Filled 2020-10-14 (×2): qty 2
  Filled 2020-10-14 (×2): qty 1

## 2020-10-14 MED ORDER — LURASIDONE HCL 20 MG PO TABS
120.0000 mg | ORAL_TABLET | Freq: Every day | ORAL | Status: DC
  Administered 2020-10-15: 120 mg via ORAL
  Filled 2020-10-14 (×3): qty 6

## 2020-10-14 MED ORDER — TRAZODONE HCL 50 MG PO TABS
150.0000 mg | ORAL_TABLET | Freq: Every evening | ORAL | Status: DC | PRN
  Administered 2020-10-15: 150 mg via ORAL
  Filled 2020-10-14: qty 1

## 2020-10-14 MED ORDER — ACETAMINOPHEN 500 MG PO TABS: 1000.0000 mg | ORAL_TABLET | Freq: Four times a day (QID) | ORAL | Status: DC | PRN

## 2020-10-14 MED ORDER — DULOXETINE HCL 60 MG PO CPEP
120.0000 mg | ORAL_CAPSULE | Freq: Every day | ORAL | Status: DC
  Administered 2020-10-15 – 2020-10-16 (×2): 120 mg via ORAL
  Filled 2020-10-14 (×2): qty 2

## 2020-10-14 MED ORDER — BUPROPION HCL ER (XL) 150 MG PO TB24
450.0000 mg | ORAL_TABLET | Freq: Every morning | ORAL | Status: DC
  Administered 2020-10-15 – 2020-10-16 (×2): 450 mg via ORAL
  Filled 2020-10-14: qty 3
  Filled 2020-10-14: qty 1

## 2020-10-14 MED ORDER — PANTOPRAZOLE SODIUM 40 MG PO TBEC: 40.0000 mg | DELAYED_RELEASE_TABLET | Freq: Every day | ORAL | Status: DC

## 2020-10-14 MED ORDER — ENSURE ENLIVE PO LIQD
237.0000 mL | Freq: Two times a day (BID) | ORAL | Status: DC
  Administered 2020-10-15 – 2020-10-16 (×2): 237 mL via ORAL
  Filled 2020-10-14 (×2): qty 237

## 2020-10-14 MED ORDER — PANTOPRAZOLE SODIUM 40 MG PO TBEC
40.0000 mg | DELAYED_RELEASE_TABLET | Freq: Every day | ORAL | Status: DC
  Administered 2020-10-15 – 2020-10-16 (×2): 40 mg via ORAL
  Filled 2020-10-14 (×2): qty 1

## 2020-10-14 MED ORDER — QUETIAPINE FUMARATE 100 MG PO TABS
100.0000 mg | ORAL_TABLET | Freq: Every day | ORAL | Status: DC
  Administered 2020-10-15 (×2): 100 mg via ORAL
  Filled 2020-10-14 (×3): qty 1

## 2020-10-14 MED ORDER — MELATONIN 5 MG PO TABS
10.0000 mg | ORAL_TABLET | Freq: Every evening | ORAL | Status: DC | PRN
  Filled 2020-10-14: qty 2

## 2020-10-14 NOTE — ED Notes (Signed)
PATIENT WANDED BY SECURITY, VALUABLES TO SECURITY SAFE.
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
## 2020-10-14 NOTE — BH Assessment (Signed)
Comprehensive Clinical Assessment (CCA) Note  10/14/2020 [name] [id number]  DISPOSITION: Gave clinical report to Becky Post, PA-C who determined Pt meets criteria for inpatient psychiatric treatment. [facility] is currently at capacity. Notified Becky Bright, PA-C and Becky Post, RN of recommendations via secure message.  The patient demonstrates the following risk factors for suicide: Chronic risk factors for suicide include: psychiatric disorder of major depressive disorder, previous suicide attempts by overdose, and medical illness reanl problems . Acute risk factors for suicide include: loss (financial, interpersonal, professional). Protective factors for this patient include: positive social support, positive therapeutic relationship, and responsibility to others (children, family). Considering these factors, the overall suicide risk at this point appears to be low. Patient is appropriate for outpatient follow up.  [place] [facility] from 10/14/2020 in [place] ED to Hosp-Admission (Discharged) from 10/03/2020 in [place] CATEGORY Error: Q3, 4, or 5 should not be populated when Q2 is No No Risk      Pt is a 64 year old single female who [name] accompanied by her sister Becky [name] [phone] due to confusion and depressive symptoms. 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 Pt was assessed alone and had difficulty answering questions appropriately. Pt is able to give her name, date of birth, and location but does not know date or why she is at [facility]. Pt answers some questions appropriately and other gives completely irrelevant responses. When asked how Pt is sleeping she responds "I think my brother knows that." Pt describes her mood as "not very good." She acknowledges symptoms including social withdrawal,  loss of interest in usual pleasures, poor concentration, and irritability. She acknowledges auditory hallucinations but cannot give any additional details. During assessment, Pt appeared at times to be responding to internal stimuli. She denies current suicidal ideation and says she has attempted suicide in the past by overdosing on pills. When asked for additional details regarding overdose, Pt responds with irrelevant answers. She denies current homicidal ideation or history of violence. She denies alcohol or other substance use.  Pt says she lives with her sister but this is not accurate. She is not able to articulate any specific stressors, responding "I don't know what to say to that." She says she has been psychiatrically hospitalized in the past but cannot give further details.  With Pt's verbal permission, TTS contacted Pt's sister Becky [name] at [phone]. She says Pt has a long history of mental health problems but over the past two months her symptoms have worsened. She she says brought Pt to [facility] because she appears confused and is incapable of holding a conversation. She says Pt was recently medically admitted due to renal failure and after discharge Pt did not seem to understand why she had been hospitalized. She says Pt lives in a condominium with her dog close by. 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 She says Pt has no friends and may be overwhelmed by the stress of this situation. She says Pt has seen Becky Grippe, MD and Becky Leyden, NP for years and is on several psychiatric medications. She says she contacted Pt's provider who said Pt needed inpatient treatment and to take Pt to [facility]. She report Pt was last psychiatrically hospitalized at [facility] many  years ago.  Pt is dressed in hospital scrubs and alert. She speaks in a soft tone, at low volume and slow pace. Motor behavior  appears normal. Eye contact is fair and at times Pt appears distracted. Pt's mood is depressed and anxious, affect is congruent with mood. Thought process is at times irrelevant. Pt's insight is poor and judgment is impaired.   Chief Complaint:  Chief Complaint  Patient presents with   Depression   Visit Diagnosis: F33.3 Major depressive disorder, Recurrent episode, With psychotic features   CCA Screening, Triage and Referral (STR)  Patient Reported Information How did you hear about [place]? Family/Friend  What Is the Reason for Your Visit/Call Today? Pt presents with AMS and depressive symptoms. She acknowledges she is here because her sister in concerned for her wellbeing.  How Long Has This Been Causing You Problems? 1-6 months  What Do You Feel Would Help You the Most Today? -- (Pt cannot answer the question.)   Have You Recently Had Any Thoughts About Hurting Yourself? No  Are You Planning to Commit Suicide/Harm Yourself At This time? No   Have you Recently Had Thoughts About [place]? No  Are You Planning to Harm Someone at This Time? No  Explanation: No data recorded  Have You Used Any Alcohol or Drugs in the Past 24 Hours? No  How Long Ago Did You Use Drugs or Alcohol? No data recorded What Did You Use and How Much? No data recorded  Do You Currently Have a Therapist/Psychiatrist? Yes  Name of Therapist/Psychiatrist: [name], MD and Becky Critchley, NP   Have You Been Recently Discharged From Any Office Practice or Programs? Yes  Explanation of Discharge From Practice/Program: Pt recently discharged from a medical unit after being treated for renal failure.     CCA Screening Triage Referral Assessment Type of Contact: Tele-Assessment  Telemedicine Service Delivery: Telemedicine service delivery: This service was provided via telemedicine using a 2-way, interactive audio and video technology  Is this Initial or Reassessment? Initial  Assessment  Date Telepsych consult ordered in [facility]:  10/14/20  Time Telepsych consult ordered in [facility]:  1759  Location of Assessment: [facility] ED  Provider Location: [facility]   Collateral Involvement: Sister: Becky [name] [phone]   Does Patient Have a Court Appointed Legal Guardian? No data recorded Name and Contact of Legal Guardian: No data recorded If Minor and Not Living with Parent(s), Who has Custody? NA  Is CPS involved or ever been involved? Never  Is APS involved or ever been involved? Never   Patient Determined To Be At Risk for Harm To Self or Others Based on Review of Patient Reported Information or Presenting Complaint? No  Method: No data recorded Availability of Means: No data recorded Intent: No data recorded Notification Required: No data recorded Additional Information for Danger to Others Potential: No data recorded Additional Comments for Danger to Others Potential: No data recorded Are There Guns or Other Weapons in Your Home? No data recorded Types of Guns/Weapons: No data recorded Are These Weapons Safely Secured?                            No data recorded Who Could Verify You Are Able To Have These Secured: No data recorded Do You Have any Outstanding Charges, Pending Court Dates, Parole/Probation? No data recorded Contacted To Inform of Risk of Harm To Self or Others: Family/Significant Other:  Does Patient Present under Involuntary Commitment? No  IVC Papers Initial File Date: No data recorded  [place] of Residence: [place]   Patient Currently Receiving the Following Services: Medication Management   Determination of Need: Emergent (2 hours)   Options For Referral: Inpatient Hospitalization     CCA Biopsychosocial Patient Reported Schizophrenia/Schizoaffective Diagnosis in Past: No   Strengths: Pt has good family support.   Mental Health Symptoms Depression:   Change in energy/activity; Difficulty  Concentrating; Fatigue; Increase/decrease in appetite; Weight gain/loss   Duration of Depressive symptoms:  Duration of Depressive Symptoms: Greater than two weeks   Mania:   None   Anxiety:    Difficulty concentrating; Fatigue; Tension; Worrying   Psychosis:   Hallucinations; Grossly disorganized speech   Duration of Psychotic symptoms:  Duration of Psychotic Symptoms: Less than six months   Trauma:   None   Obsessions:   None   Compulsions:   None   Inattention:   N/A   Hyperactivity/Impulsivity:   N/A   Oppositional/Defiant Behaviors:   N/A   Emotional Irregularity:   None   Other Mood/Personality Symptoms:   NA    Mental Status Exam Appearance and self-care  Stature:   Small   Weight:   Average weight   Clothing:   -- (Scrubs)   Grooming:   Normal   Cosmetic use:   None   Posture/gait:   Normal   Motor activity:   Not Remarkable   Sensorium  Attention:   Confused   Concentration:   Scattered   Orientation:   Person; Place   Recall/memory:   Defective in Short-term; Defective in Remote   Affect and Mood  Affect:   Anxious   Mood:   Anxious   Relating  Eye contact:   Normal   Facial expression:   Anxious   Attitude toward examiner:   Cooperative   Thought and Language  Speech flow:  Paucity; Flight of Ideas   Thought content:   Appropriate to Mood and Circumstances   Preoccupation:   None   Hallucinations:   Auditory   Organization:  No data recorded  [employer] of Knowledge:   Average   Intelligence:   Average   Abstraction:   Concrete   Judgement:   Poor   Reality Testing:   Variable   Insight:   Poor   Decision Making:   Vacilates; Confused   Social Functioning  Social Maturity:   Isolates   Social Judgement:   Normal   Stress  Stressors:   Grief/losses   Coping Ability:   Exhausted; Overwhelmed   Skill Deficits:   None   Supports:   Family      Religion: Religion/Spirituality Are You A Religious Person?: Yes What is Your Religious Affiliation?: Unknown How Might This Affect Treatment?: NA  Leisure/Recreation: Leisure / Recreation Do You Have Hobbies?: No  Exercise/Diet: Exercise/Diet Do You Exercise?: No Have You Gained or Lost A Significant Amount of Weight in the Past Six Months?: Yes-Lost Number of Pounds Lost?:  (Pt cannot estimate) Do You Follow a Special Diet?: No Do You Have Any Trouble Sleeping?: Yes Explanation of Sleeping Difficulties: Pt cannot explain   CCA Employment/Education Employment/Work Situation: Employment / Work Situation Employment Situation: On disability Why is Patient on Disability: Mental health How Long has Patient Been on Disability: Unknown Patient's Job has Been Impacted by Current Illness: No Has Patient ever Been in the [employer]?: No  Education: Education Is Patient Currently Attending  School?: No   CCA Family/Childhood History Family and Relationship History: Family history Marital status: Single Does patient have children?: No  Childhood History:  Childhood History By whom was/is the patient raised?: Both parents Did patient suffer any verbal/emotional/physical/sexual abuse as a child?: No Did patient suffer from severe childhood neglect?: No Has patient ever been sexually abused/assaulted/raped as an adolescent or adult?: No Was the patient ever a victim of a crime or a disaster?: No Witnessed domestic violence?: No Has patient been affected by domestic violence as an adult?: No  Child/Adolescent Assessment:     CCA Substance Use Alcohol/Drug Use: Alcohol / Drug Use Pain Medications: Denies abuse Prescriptions: Denies abuse Over the Counter: Denies abuse History of alcohol / drug use?: No history of alcohol / drug abuse Longest period of sobriety (when/how long): NA                         ASAM's:  Six Dimensions of Multidimensional  Assessment  Dimension 1:  Acute Intoxication and/or Withdrawal Potential:      Dimension 2:  Biomedical Conditions and Complications:      Dimension 3:  Emotional, Behavioral, or Cognitive Conditions and Complications:     Dimension 4:  Readiness to Change:     Dimension 5:  Relapse, Continued use, or Continued Problem Potential:     Dimension 6:  Recovery/Living Environment:     ASAM Severity Score:    ASAM Recommended Level of Treatment:     Substance use Disorder (SUD)    Recommendations for Services/Supports/Treatments:    Discharge Disposition:    DSM5 Diagnoses: Patient Active Problem List   Diagnosis Date Noted   Malnutrition of moderate degree 10/06/2020   AKI (acute kidney injury) ([place]) 10/04/2020   Acute encephalopathy 10/04/2020   MCI (mild cognitive impairment) 08/05/2019   Chronic insomnia 08/05/2019   PTSD ([name]-traumatic stress disorder)    Generalized anxiety disorder    History of trauma    Major depressive disorder 11/19/2018   Claw toe, right 09/10/2017   Bunion of great toe of right foot 05/21/2016   Knee pain, right 11/19/2014   Right hip pain 11/19/2014   Chronic cough 06/15/2014   HTN (hypertension) 06/15/2014   Lumbar foraminal stenosis 12/07/2013   Osteopenia determined by DEXXA 04/2013   Benign paroxysmal positional vertigo 03/01/2013   Nonsuppurative otitis media, not specified as acute or chronic 03/01/2013   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   Diabetes mellitus ([place]) 07/01/2011   Asthma, moderate persistent 01/15/2011   Osteoarthrosis, unspecified whether generalized or localized, involving lower leg 10/30/2009   Backache 10/30/2009   Orthostatic hypotension 05/17/2008   Eating disorder, unspecified 11/10/2007   Hyperglycemia 02/23/2007   Gastroparesis 04/25/2006     Referrals to Alternative Service(s): Referred to Alternative Service(s):   Place:   Date:   Time:    Referred to Alternative Service(s):   Place:    Date:   Time:    Referred to Alternative Service(s):   Place:   Date:   Time:    Referred to Alternative Service(s):   Place:   Date:   Time:     Becky Peat, [facility]

## 2020-10-14 NOTE — ED Notes (Signed)
Sister Curt Bears [phone] would like an update and to talk to sister

## 2020-10-14 NOTE — ED Notes (Signed)
Pt removing additional personal belongings at this time and is now agreeable to stay.

## 2020-10-14 NOTE — ED Provider Notes (Signed)
Emergency Medicine Provider Triage Evaluation Note  [name] , a 65 y.o. female  was evaluated in triage.  Patient present for medical clearance to go to [facility]. Denies visual or auditory hallucinations, not SI/HI at this time. Reports history of both is why her sister is wanting her to go to [place]. Patient has no complaints.  Review of Systems  Positive:  Negative: SI/HI, hallucinations, headaches, chest pain, shortness of breath, nausea, vomiting, diarrhea  Physical Exam  BP 102/71 (BP Location: Right Arm)   Pulse 68   Temp 98.5 F (36.9 C) (Oral)   Resp 14   SpO2 93%  Gen:   Awake, no distress  Resp:  Normal effort  MSK:   Moves extremities without difficulty  Other:    Medical Decision Making  Medically screening exam initiated at 11:21 AM.  Appropriate orders placed.  [facility]y [name] was informed that the remainder of the evaluation will be completed by another provider, this initial triage assessment does not replace that evaluation, and the importance of remaining in the ED until their evaluation is complete.    [name] 10/14/20 1127    [name], MD 10/16/20 [phone]

## 2020-10-14 NOTE — ED Notes (Signed)
Pt changed into burgundy scrubs but is refusing to remove watch and bra.  States she is not suicidal and does not want to be here.  States she is leaving.  PA in to talk with pt.

## 2020-10-14 NOTE — ED Triage Notes (Addendum)
Pt states her sister brought her to the ED for medical clearance to go to "[facility]".  Pt denies SI/HI and denies hallucinations.  States she has anorexia and depression.  Reports history of suicidal ideation but denies at this time.

## 2020-10-15 ENCOUNTER — Encounter (HOSPITAL_COMMUNITY): Payer: Self-pay | Admitting: Registered Nurse

## 2020-10-15 DIAGNOSIS — F329 Major depressive disorder, single episode, unspecified: Secondary | ICD-10-CM | POA: Diagnosis not present

## 2020-10-15 DIAGNOSIS — F4481 Dissociative identity disorder: Secondary | ICD-10-CM | POA: Diagnosis not present

## 2020-10-15 DIAGNOSIS — F411 Generalized anxiety disorder: Secondary | ICD-10-CM | POA: Diagnosis not present

## 2020-10-15 DIAGNOSIS — F32A Depression, unspecified: Secondary | ICD-10-CM

## 2020-10-15 DIAGNOSIS — F332 Major depressive disorder, recurrent severe without psychotic features: Secondary | ICD-10-CM | POA: Diagnosis not present

## 2020-10-15 LAB — COMPREHENSIVE METABOLIC PANEL
ALT: 11 U/L (ref 0–44)
AST: 11 U/L — ABNORMAL LOW (ref 15–41)
Albumin: 3.2 g/dL — ABNORMAL LOW (ref 3.5–5.0)
Alkaline Phosphatase: 48 U/L (ref 38–126)
Anion gap: 5 (ref 5–15)
BUN: 7 mg/dL — ABNORMAL LOW (ref 8–23)
CO2: 28 mmol/L (ref 22–32)
Calcium: 9 mg/dL (ref 8.9–10.3)
Chloride: 105 mmol/L (ref 98–111)
Creatinine, Ser: 0.89 mg/dL (ref 0.44–1.00)
GFR, Estimated: >60 mL/min (ref >60)
Glucose, Bld: 106 mg/dL — ABNORMAL HIGH (ref 70–99)
Potassium: 4.1 mmol/L (ref 3.5–5.1)
Sodium: 138 mmol/L (ref 135–145)
Total Bilirubin: 0.2 mg/dL — ABNORMAL LOW (ref 0.3–1.2)
Total Protein: 5.4 g/dL — ABNORMAL LOW (ref 6.5–8.1)

## 2020-10-15 LAB — CBG MONITORING, ED: Glucose-Capillary: 115 mg/dL — ABNORMAL HIGH (ref 70–99)

## 2020-10-15 MED ORDER — METFORMIN HCL ER 500 MG PO TB24
500.0000 mg | ORAL_TABLET | Freq: Every evening | ORAL | Status: DC
  Administered 2020-10-15: 500 mg via ORAL
  Filled 2020-10-15: qty 1

## 2020-10-15 NOTE — BH Assessment (Addendum)
@  1245, requesting patient's nurse Jordan Hawks, RN) and Olevia Bowens, RN) to place the TTS machine in patient's room for a re-assessment.
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
## 2020-10-15 NOTE — ED Notes (Signed)
Breakfast tray delivered to pt at this time.  

## 2020-10-15 NOTE — ED Notes (Signed)
Report to Beth, RN

## 2020-10-15 NOTE — ED Notes (Signed)
Pt seen at [place] station, touching unused supplies on the counter and saying "I'm picking up my pills." Pt redirected to her hallway bed in [facility] Zone and reminded to not walk around the department alone.

## 2020-10-15 NOTE — ED Notes (Signed)
Sister visiting pt at this time.

## 2020-10-15 NOTE — ED Notes (Signed)
Placed Breakfast Order 

## 2020-10-15 NOTE — Progress Notes (Signed)
CSW attempted to follow-up with [facility], however was unable to make contact with admission by phone.   Glennie Isle, MSW, [place], LCAS-A Phone: [phone] Disposition/TOC

## 2020-10-15 NOTE — ED Notes (Signed)
Pt ambulating in hallway, back and forth to bathroom.

## 2020-10-15 NOTE — ED Notes (Signed)
Pt talking to TTS 
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
## 2020-10-15 NOTE — Progress Notes (Signed)
Per Paulene Floor, patient meets criteria for inpatient treatment. There are no available or appropriate beds at [facility] today. CSW faxed referrals to the following facilities for review:  [facility]  Pending - No Request Sent N/A [address], [place] [address] [phone] --  [facility]  Pending - No Request Sent N/A [facility] [address] [place] [address] [phone] --  [address] [name] [address] [phone] --  [facility]  Pending - No Request Sent N/A [address] [place] [address] [phone] --  [facility]  Pending - No Request Sent N/A [address] [place], [place] [address] [phone] --  [facility]  Pending - No Request Sent N/A [address] [place] [address] [phone] --  [facility]  Pending - No Request Sent N/A [address] [place] [address] [phone] --  [facility]  Pending - No Request Sent N/A [place], [place]  [address] [phone] --  [facility]  Pending - No Request Sent N/A [address], [place] [address] [phone] --  [facility]  Pending - No Request Sent N/A [address] [place] [address] [id number] --  [facility]  Pending - No Request Sent N/A [address] [place] [address] [phone] --  [facility]  Pending - No Request Sent N/A [place], [place] [address] [id number] --   TTS will continue to seek bed placement.  Glennie Isle, MSW, [place], LCAS-A Phone: [phone] Disposition/TOC

## 2020-10-16 DIAGNOSIS — F329 Major depressive disorder, single episode, unspecified: Secondary | ICD-10-CM | POA: Diagnosis not present

## 2020-10-16 NOTE — ED Notes (Signed)
Breakfast orders placed 

## 2020-10-16 NOTE — ED Notes (Signed)
Spoke with [name], pt's sister. Per pt's sister, she will get in touch with her husband to pick the pt up as she's caring for her elderly parents at the moment. Tentative time around 11:30AM.

## 2020-10-16 NOTE — ED Notes (Signed)
TTS talking to pt.

## 2020-10-16 NOTE — Progress Notes (Signed)
Patient continues to meet criteria for [facility] inpatient and has been recommended been faxed out per Dr. Dwyane Dee. Patient meets inpatient criteria per Earleen Newport ,NP. Patient referred to the following facilities:  [facility]  [address], [place] [address] [phone]  [facility]  [address], [place] [address] [phone]  [facility]  [address], [place] [address] [phone]  [facility]  [place], [place] [address] [phone]  [facility]  [address] [place], [place] [address] [phone] [facility]  [address] [place] [address] [phone]  [facility]  [address] [place] [address] [phone] [facility]  [address], [place] [address] [phone] [facility]  [address], [place] [address] [phone] [facility]  [address], [place] [address] [phone]  [facility]  [address] [place] [address] [id number] [facility]  [place], [place] [address] [phone] [address]  [place], [place] [address] [id number] [place]  [address], [place] [address] [phone]    CSW will continue to monitor disposition.    Mariea Clonts, MSW, LCSW-A  9:43 AM 10/16/2020
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
## 2020-10-16 NOTE — ED Provider Notes (Signed)
  Physical Exam  BP 96/65 (BP Location: Left Arm)   Pulse 72   Temp 97.9 F (36.6 C) (Oral)   Resp 17   SpO2 96%   Physical Exam  ED Course/Procedures     Procedures  MDM  Patient's been seen by psychiatry and will be discharged       Davonna Belling, MD 10/16/20 1151

## 2020-10-16 NOTE — Discharge Instructions (Signed)
Follow up with your primary provider for referral to neurology consult follow up

## 2020-10-16 NOTE — ED Notes (Signed)
Belongings returned to patient. All appropriate discharge materials reviewed at length with patient. Time for questions provided. Pt has no other questions at this time and verbalizes understanding of all provided materials.

## 2020-10-23 ENCOUNTER — Ambulatory Visit (INDEPENDENT_AMBULATORY_CARE_PROVIDER_SITE_OTHER): Payer: Medicare Other | Admitting: Neurology

## 2020-10-23 ENCOUNTER — Encounter: Payer: Self-pay | Admitting: Neurology

## 2020-10-23 VITALS — BP 108/82 | HR 69 | Ht 60.75 in | Wt 102.4 lb

## 2020-10-23 DIAGNOSIS — G3184 Mild cognitive impairment, so stated: Secondary | ICD-10-CM | POA: Diagnosis not present
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
## 2020-10-26 ENCOUNTER — Ambulatory Visit: Payer: Medicare Other | Admitting: Family Medicine

## 2020-10-31 ENCOUNTER — Other Ambulatory Visit: Payer: Self-pay

## 2020-10-31 ENCOUNTER — Ambulatory Visit (INDEPENDENT_AMBULATORY_CARE_PROVIDER_SITE_OTHER): Payer: Medicare Other | Admitting: Family Medicine

## 2020-10-31 DIAGNOSIS — E440 Moderate protein-calorie malnutrition: Secondary | ICD-10-CM | POA: Diagnosis not present

## 2020-10-31 DIAGNOSIS — F5001 Anorexia nervosa, restricting type: Secondary | ICD-10-CM | POA: Diagnosis not present

## 2020-10-31 NOTE — Patient Instructions (Signed)
TODAY:  Call about getting a replacement food assistance card.  Ask if there is a way to use your discount without the card, i.e., email?    Take iron on an empty stomach whenever possible, and with at least 200 mg vitamin C.  (Higher dose of vitamin C is also ok.)     Reiterated from July appt:  Explore generic (cheaper) options for a nutrition drink.    Priority #1: Nourish yourself adequately to allow both your brain and body to function optimally.    Goals:   1. Eat at least 3 meals a day and 1-2 snacks, relatively evenly spaced throughout the day, and starting within the first hour of getting up.     - Breakfast:  Yogurt, cereal, nuts/seeds, fresh fruit.     - Lunch: Minimum of Ensure Plus and fruit or a full sandwich.and fruit      - Eat a meal each time you feed [name].   2. At least 3 times per day, include a protein source plus a piece of fruit and/or a carb food, e.g., bread, cereal, crackers, cookies.     Examples of meals that meet the minimum requirements: Breakfast:        2 toast with peanut butter, 1 Greek yogurt Lunch:             Cheese sandwich, fruit and/or vegetables Dinner:            1 soy cheeseburger on bun, salad or cooked veg (from frozen) Snack options:  Any fruit, string cheese, yogurt, 1/4 c nuts, 1/2 peanut butter sandwich (>1 tbsp peanut butter), cereal with yogurt, crackers with cheese or peanut butter, 1 Ensure.     Follow-up in-office visit on Tuesday, Oct 18 at 11 AM.    Place this print-out in a place you'll see it and can review it every day.

## 2020-10-31 NOTE — Progress Notes (Signed)
Medical Nutrition Therapy Encounter  PCP: Kathryne Eriksson, MD Neurologist: Margette Fast, MD Therapist: [employer]a [employer], APRN, MSN, CS  Psychiatrist: [name], MD  Appt start time: 1430 end time: 1500 (30 minutes)  Reason for visit: Follow-up of Medical Nutrition Therapy for anorexia nervosa, restricting type (F50.01).  Relevant history/background: Becky [name] continues to have anxiety about her weight, and although she establishes goals to nourish herself better and to exercise, she has been eating inadequately for a very long time now.  Still suffering from chronic insomnia as well.    Assessment: Becky [name] was hospitalized on Aug 23-28, having presented to the ER with her sister, who reported confusion and abnormal behavior.  She was readmitted on Sept 3, and the family wanted to have Becky [name] admitted to a mental health facility, which Becky [name] refused.  Diagnoses during the 10/03/20 hospitalization included dehydration, hypokalemia, uremia (BUN 40), and AKI (creatinine 2.5).  She was stabilized with fluids and potassium, and  encouragement to increase PO intake.  CT image of head revealed no acute intracranial abnormality, and EKG was normal.  Hospital RD Becky [name] emphasized to Becky [name] the importance of better nutrition, and helped her write a grocery list for when she got home.  Becky [name] said she has not shopped recently b/c she cannot find her food assistance card.    She saw PCP Dr. Kathryne Eriksson on 10/11/20 for annual exam and Medicare wellness visit, but notes accessible from that appt do not mention recent hospitalization, memory concerns, or malnutrition.  She sees psychiatrist [name] tomorrow, who will review medications.   Weight: 103.4 lb today, down 10 lb since 10/03/20 admission, although up 2 lb since appt with neuro on 10/23/20.  Today's weight represents a 22-lb loss since 09/05/20 MNT appt.   Usual eating pattern: Eating 3 X day, but no real meals.   Physical activity: None  currently.  Sleep: Estimates 5 hrs sleep/night. Taking 3 mg eszopiclone (Lunesta) nightly and 60 mg lirasadone (Latuda), which causes tremors.  Taking 1 mg benztropine to manage tremors.   24-hr recall is dubious; pt had difficulty remembering her day.  (Up at ? AM) B (7:30 AM)-  5 oz fruit Grk yogurt, 1 bottle Ensure Plus Snk ( AM)-  water L (12:30 PM)-  1 PB and apple butter sandwich, water Snk ( PM)-  8 saltines, water D (6 PM)-  8 saltines with PB, water Snk ( PM)-  --- Typical day? Yes.   Has some fruit and veg's at home currently, but needs to shop.    Previous kcal intake estimates: 08/29/20:  870  07/31/20: 875  06/29/20:  850  04/20/20:  700 03/23/20:           650 02/21/20 625 01/17/20 570 12/13/19 500  Intervention: Reviewed recent diet and exercise history, and again encouraged to meet food goals, and to measure (and report) orthostatic BP, as well as to f/u with PCP.    For recommendations and goals, see Patient Instructions.    Follow-up: 4 weeks.   Becky [name],[name]

## 2020-11-07 ENCOUNTER — Other Ambulatory Visit: Payer: Self-pay | Admitting: Family Medicine

## 2020-11-13 ENCOUNTER — Other Ambulatory Visit: Payer: Self-pay | Admitting: Family Medicine

## 2020-11-13 DIAGNOSIS — M7989 Other specified soft tissue disorders: Secondary | ICD-10-CM

## 2020-11-28 ENCOUNTER — Other Ambulatory Visit: Payer: Self-pay

## 2020-11-28 ENCOUNTER — Ambulatory Visit: Payer: Medicare Other | Admitting: Family Medicine

## 2020-12-01 ENCOUNTER — Emergency Department (HOSPITAL_COMMUNITY)
Admission: EM | Admit: 2020-12-01 | Discharge: 2020-12-01 | Disposition: A | Discharge status: Home / Self Care | Payer: Medicare Other | Attending: Emergency Medicine | Admitting: Emergency Medicine

## 2020-12-01 ENCOUNTER — Encounter (HOSPITAL_COMMUNITY): Payer: Self-pay | Admitting: Emergency Medicine

## 2020-12-01 DIAGNOSIS — F509 Eating disorder, unspecified: Secondary | ICD-10-CM | POA: Insufficient documentation

## 2020-12-01 DIAGNOSIS — J4540 Moderate persistent asthma, uncomplicated: Secondary | ICD-10-CM | POA: Diagnosis not present

## 2020-12-01 DIAGNOSIS — E119 Type 2 diabetes mellitus without complications: Secondary | ICD-10-CM | POA: Diagnosis not present

## 2020-12-01 DIAGNOSIS — F5001 Anorexia nervosa, restricting type: Secondary | ICD-10-CM | POA: Diagnosis not present

## 2020-12-01 DIAGNOSIS — Z7951 Long term (current) use of inhaled steroids: Secondary | ICD-10-CM | POA: Insufficient documentation

## 2020-12-01 DIAGNOSIS — I10 Essential (primary) hypertension: Secondary | ICD-10-CM | POA: Diagnosis not present

## 2020-12-01 DIAGNOSIS — Z7984 Long term (current) use of oral hypoglycemic drugs: Secondary | ICD-10-CM | POA: Insufficient documentation

## 2020-12-01 DIAGNOSIS — Z87891 Personal history of nicotine dependence: Secondary | ICD-10-CM | POA: Insufficient documentation

## 2020-12-01 LAB — CBC WITH DIFFERENTIAL/PLATELET
Abs Immature Granulocytes: 0.03 10*3/uL (ref 0.00–0.07)
Basophils Absolute: 0 10*3/uL (ref 0.0–0.1)
Basophils Relative: 1 %
Eosinophils Absolute: 0.2 10*3/uL (ref 0.0–0.5)
Eosinophils Relative: 4 %
HCT: 39.6 % (ref 36.0–46.0)
Hemoglobin: 13.1 g/dL (ref 12.0–15.0)
Immature Granulocytes: 1 %
Lymphocytes Relative: 23 %
Lymphs Abs: 1 10*3/uL (ref 0.7–4.0)
MCH: 31.8 pg (ref 26.0–34.0)
MCHC: 33.1 g/dL (ref 30.0–36.0)
MCV: 96.1 fL (ref 80.0–100.0)
Monocytes Absolute: 0.5 10*3/uL (ref 0.1–1.0)
Monocytes Relative: 11 %
Neutro Abs: 2.8 10*3/uL (ref 1.7–7.7)
Neutrophils Relative %: 60 %
Platelets: 218 10*3/uL (ref 150–400)
RBC: 4.12 MIL/uL (ref 3.87–5.11)
RDW: 13.2 % (ref 11.5–15.5)
WBC: 4.6 10*3/uL (ref 4.0–10.5)
nRBC: 0 % (ref 0.0–0.2)

## 2020-12-01 LAB — MAGNESIUM: Magnesium: 1.9 mg/dL (ref 1.7–2.4)

## 2020-12-01 LAB — COMPREHENSIVE METABOLIC PANEL
ALT: 14 U/L (ref 0–44)
AST: 17 U/L (ref 15–41)
Albumin: 4.2 g/dL (ref 3.5–5.0)
Alkaline Phosphatase: 61 U/L (ref 38–126)
Anion gap: 8 (ref 5–15)
BUN: 14 mg/dL (ref 8–23)
CO2: 27 mmol/L (ref 22–32)
Calcium: 9.3 mg/dL (ref 8.9–10.3)
Chloride: 100 mmol/L (ref 98–111)
Creatinine, Ser: 1.18 mg/dL — ABNORMAL HIGH (ref 0.44–1.00)
GFR, Estimated: 52 mL/min — ABNORMAL LOW (ref >60)
Glucose, Bld: 99 mg/dL (ref 70–99)
Potassium: 3.6 mmol/L (ref 3.5–5.1)
Sodium: 135 mmol/L (ref 135–145)
Total Bilirubin: 0.5 mg/dL (ref 0.3–1.2)
Total Protein: 7.1 g/dL (ref 6.5–8.1)

## 2020-12-01 NOTE — ED Notes (Signed)
[employer]a [employer], psych NP, called and gave some information on this pt. Pt was attempting to go to [facility] yesterday and had a dissociative episode, was lost for 10 hours per PNP. 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 [name] [phone].

## 2020-12-01 NOTE — ED Notes (Signed)
Spoke to therapist, Juliann Pulse, and informed her that the patient was being discharged. Juliann Pulse stated that she would work on an outpatient referral for psychiatric treatment.
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
## 2020-12-01 NOTE — ED Triage Notes (Signed)
Patient here from home reporting rapid weight loss . Reports that she talked with therapist today and she suggest she come here. Patient reports hx of anorexia. Reports last full meal 2 nights ago. "I snack all the time".

## 2020-12-01 NOTE — Discharge Instructions (Addendum)
Your blood test and vital signs were normal.  Follow-up with your regular doctor within the week.  Return immediately back to the ER if:  Your symptoms worsen within the next 12-24 hours. You develop new symptoms such as new fevers, persistent vomiting, new pain, shortness of breath, or new weakness or numbness, or if you have any other concerns.

## 2020-12-06 ENCOUNTER — Ambulatory Visit (HOSPITAL_COMMUNITY)
Admission: EM | Admit: 2020-12-06 | Discharge: 2020-12-06 | Disposition: A | Discharge status: Home / Self Care | Payer: Medicare Other | Attending: Registered Nurse | Admitting: Registered Nurse

## 2020-12-06 ENCOUNTER — Encounter (HOSPITAL_COMMUNITY): Payer: Self-pay | Admitting: Registered Nurse

## 2020-12-06 DIAGNOSIS — F4323 Adjustment disorder with mixed anxiety and depressed mood: Secondary | ICD-10-CM | POA: Diagnosis present

## 2020-12-06 DIAGNOSIS — Z636 Dependent relative needing care at home: Secondary | ICD-10-CM | POA: Insufficient documentation

## 2020-12-06 DIAGNOSIS — F4310 Post-traumatic stress disorder, unspecified: Secondary | ICD-10-CM | POA: Diagnosis present

## 2020-12-06 DIAGNOSIS — F5000 Anorexia nervosa, unspecified: Secondary | ICD-10-CM | POA: Diagnosis not present

## 2020-12-06 DIAGNOSIS — F4481 Dissociative identity disorder: Secondary | ICD-10-CM | POA: Diagnosis present

## 2020-12-06 DIAGNOSIS — F419 Anxiety disorder, unspecified: Secondary | ICD-10-CM | POA: Diagnosis present

## 2020-12-06 DIAGNOSIS — F411 Generalized anxiety disorder: Secondary | ICD-10-CM | POA: Diagnosis present

## 2020-12-06 NOTE — ED Notes (Signed)
Pt given AVS and all questions answered. Pt D/C'd home per provider.

## 2020-12-06 NOTE — Progress Notes (Signed)
TRIAGE: ROUTINE  Shuvon Rankin, NP, reviewed pt's chart and information and determined pt can be psych cleared with information for eating disorder programming.   12/06/20 1915  [facility] Triage Screening (Walk-ins at [facility] only)  How Did You Hear About [place]? Family/Friend  What Is the Reason for Your Visit/Call Today? Pt shares she has been dealing with an eating disorder. She states she experiences AVH at times, primarily AH, though she states she takes medication for these symptoms. Pt denies SI or a plan to harm/kill herself. She denies HI, access to guns/weapons, and SA.  How Long Has This Been Causing You Problems? > than 6 months  Have You Recently Had Any Thoughts About Hurting Yourself? No  Are You Planning to Commit Suicide/Harm Yourself At This time? No  Have you Recently Had Thoughts About [place]? No  Are You Planning To Harm Someone At This Time? No  Are you currently experiencing any auditory, visual or other hallucinations? Yes  Please explain the hallucinations you are currently experiencing: Pt shares she experiences AVH, primarily AH; she states she takes medication for these symptoms.  Have You Used Any Alcohol or Drugs in the Past 24 Hours? No  Do you have any current medical co-morbidities that require immediate attention? No  Clinician description of patient physical appearance/behavior: Pt is dressed in age-appropriate attire. She appears neat and with good self-care and hygiene. She is able to answer the questions posed.  What Do You Feel Would Help You the Most Today?  (Information for eating disorder programming)  If access to [facility] was not available, would you have sought care in the Emergency Department? No  Determination of Need Routine (7 days)  Options For Referral Other: Comment (Eating disorder treatment information)

## 2020-12-06 NOTE — Progress Notes (Signed)
CSW entered resources for eating disorder on pt's AVS  for follow up.   Benjaman Kindler, MSW, [facility] 12/06/2020 8:15 PM

## 2020-12-06 NOTE — ED Provider Notes (Signed)
Behavioral Health Urgent Care Medical Screening Exam  Patient Name: Becky [name] MRN: [id number] Date of Evaluation: 12/06/20 Chief Complaint:   Diagnosis:  Final diagnoses:  Adjustment disorder with mixed anxiety and depressed mood  Anorexia nervosa  PTSD (post-traumatic stress disorder)    History of Present illness: Becky [name] is a 65 y.o. female patient presented to [facility] as a walk in accompanied by a friend with complaints of increased stress that has caused relapse and eating disorder  Becky [name], 65 y.o., female patient seen face to face by this provider, consulted with Dr. Dr. Ernie Hew; and chart reviewed on 12/06/20.  On evaluation Becky [name] reports she came in related to have an eating disorder.  States that she is down to 99 pounds and her usual weight is around 120 pounds.  Reports that her primary care provider and her psychiatric provider are aware of her situation and what is going on.  States her psychiatrist [name] wanted her to come in for psychiatric evaluation.  Patient reports that there is some depression and stress.  States she has been the primary caregiver for her parents.  States her father is 61 years old and bed ridden now and her mother is 23 years old with dementia.  States that her sister has been helping out more but she feels as though she is being left out.  Patient denies suicidal/self-harm/homicidal ideations, psychosis, paranoia.  Discussed intensive outpatient services or partial hospitalization and patient states she is interested.  Informed would send a referral to [facility] behavioral health outpatient services and she will receive a call by tomorrow around 10 AM.  Also discussed resources for eating disorders. During evaluation Becky [name] is sitting upright in chair in no acute distress.  She is alert/oriented x 4; calm/cooperative; and mood congruent with affect.  She is speaking in a clear  tone at moderate volume, and normal pace; with good eye contact.  Her thought process is coherent and relevant; There is no indication that she is currently responding to internal/external stimuli or experiencing delusional thought content; and she has denied suicidal/self-harm/homicidal ideation, psychosis, and paranoia.   Patient has remained calm throughout assessment and has answered questions appropriately.    Psychiatric Specialty Exam  Presentation  General Appearance:Appropriate for Environment; Casual  Eye Contact:Good  Speech:Clear and Coherent; Normal Rate  Speech Volume:Normal  Handedness:Right   Mood and Affect  Mood:Anxious; Depressed  Affect:Appropriate; Congruent   Thought Process  Thought Processes:Coherent; Goal Directed  Descriptions of Associations:Intact  Orientation:Full (Time, Place and Person)  Thought Content:Logical; WDL  Diagnosis of Schizophrenia or Schizoaffective disorder in past: No  Duration of Psychotic Symptoms: Less than six months  Hallucinations:None  Ideas of Reference:None  Suicidal Thoughts:No Without Intent; Without Plan  Homicidal Thoughts:No   Sensorium  Memory:Immediate Good; Recent Good; Remote Good  Judgment:Intact  Insight:Present   Executive Functions  Concentration:Good  Attention Span:Good  [place]  Language:Good   Psychomotor Activity  Psychomotor Activity:Normal   Assets  Assets:Communication Skills; Desire for Improvement; Financial Resources/Insurance; Housing; Leisure Time; Resilience; Social Support   Sleep  Sleep:Good  Number of hours: No data recorded  Nutritional Assessment (For OBS and FBC admissions only) Has the patient had a weight loss or gain of 10 pounds or more in the last 3 months?: Yes Has the patient had a decrease in food intake/or appetite?: Yes Does the patient have dental problems?: No Does the patient have eating habits  or behaviors  that may be indicators of an eating disorder including binging or inducing vomiting?: Yes Has the patient recently lost weight without trying?: 2 Has the patient been eating poorly because of a decreased appetite?: 0 Malnutrition Screening Tool Score: 2 Nutritional Assessment Referrals: Refer to Eating Disorder Program/Facility (Also referred to IOP and social work for resources for eating disorder, and her primary provider)    Physical Exam: Physical Exam Vitals and nursing note reviewed. Exam conducted with a chaperone present.  Constitutional:      General: She is not in acute distress.    Appearance: Normal appearance. She is not ill-appearing.  Cardiovascular:     Rate and Rhythm: Normal rate.  Pulmonary:     Effort: Pulmonary effort is normal.  Musculoskeletal:        General: Normal range of motion.     Cervical back: Normal range of motion.  Skin:    General: Skin is warm and dry.  Neurological:     Mental Status: She is alert and oriented to person, place, and time.  Psychiatric:        Attention and Perception: Attention and perception normal. She does not perceive auditory or visual hallucinations.        Mood and Affect: Affect normal. Mood is anxious and depressed.        Speech: Speech normal.        Behavior: Behavior normal. Behavior is cooperative.        Thought Content: Thought content normal. Thought content is not paranoid or delusional. Thought content does not include homicidal or suicidal ideation.        Cognition and Memory: Cognition and memory normal.   Review of Systems  Constitutional:  Positive for weight loss (Reports has eating disorder and currently weighs around 99 pounds and normal weight is about 120).  HENT: Negative.    Eyes: Negative.   Respiratory: Negative.    Cardiovascular: Negative.   Gastrointestinal: Negative.   Genitourinary: Negative.   Musculoskeletal: Negative.   Skin: Negative.   Neurological: Negative.    Endo/Heme/Allergies: Negative.   Psychiatric/Behavioral:  Positive for depression. Negative for hallucinations, substance abuse and suicidal ideas. The patient is nervous/anxious. The patient does not have insomnia.        Reporting a lot of stress.  Has been her parents caregiver for many years; now family helping out because of the stress on her and parents needing more help but she is feeling left out since she doesn't have as much responsibility any more  Blood pressure (!) 119/98, pulse 65, temperature 98.2 F (36.8 C), temperature source Oral, resp. rate 16, SpO2 100 %. There is no height or weight on file to calculate BMI.  Musculoskeletal: Strength & Muscle Tone: within normal limits Gait & Station: normal Patient leans: N/A   [place] [facility] Discharge Disposition for Follow up and Recommendations: Based on my evaluation the patient does not appear to have an emergency medical condition and can be discharged with resources and follow up care in outpatient services for Medication Management, Partial Hospitalization Program, Individual Therapy, and Group Therapy   Follow-up [facility].   Specialty: Behavioral Health Why: Referral sent in for outpatient therapy.  If you don't hear anything by 10:00 tomorrow call the above number to set up services Contact information: [address] [phone]  Social worker will also add resources for eating disorder to AVS   Sudeep Scheibel, NP 12/06/2020, 7:36 PM

## 2020-12-07 ENCOUNTER — Telehealth (HOSPITAL_COMMUNITY): Payer: Self-pay | Admitting: Professional

## 2020-12-07 NOTE — Telephone Encounter (Signed)
Cln returned pt call. Pt reports she did not ask about the PHP or IOP program at [facility] which is what this cln was told; pt does not know what these programs are. Pt is unsure of what services she is looking for. 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 Pt reports she has a therapist, Trinna Balloon, and the therapist wants pt to go inpt for eating disorder. Pt does not want to do inpt. Cln provides pt with information for [facility] [phone]) and encourages pt to call.

## 2020-12-08 ENCOUNTER — Telehealth (HOSPITAL_COMMUNITY): Payer: Self-pay | Admitting: Family Medicine

## 2020-12-08 NOTE — BH Assessment (Signed)
Care Management - Follow Up [facility] Discharges   Writer attempted to make contact with patient today and was unsuccessful.  Writer left a HIPPA compliant voice message.   Per chart review, patient has an established therapist [facility]

## 2020-12-25 ENCOUNTER — Ambulatory Visit: Payer: Medicare Other | Admitting: Family Medicine

## 2020-12-28 ENCOUNTER — Other Ambulatory Visit: Payer: Self-pay

## 2020-12-28 ENCOUNTER — Ambulatory Visit (INDEPENDENT_AMBULATORY_CARE_PROVIDER_SITE_OTHER): Payer: Medicare Other | Admitting: Family Medicine

## 2020-12-28 DIAGNOSIS — F5000 Anorexia nervosa, unspecified: Secondary | ICD-10-CM

## 2020-12-28 NOTE — Progress Notes (Signed)
Telehealth Encounter for Medical Nutrition Therapy (MNT)  PCP: Kathryne Eriksson, MD Neurologist: Margette Fast, MD Therapist: Rea [employer], APRN, MSN, CS  Psychiatrist: [name], MD I connected with [name] (MRN [id number]) on 12/28/2020 by [employer] video-enabled, HIPAA-compliant telemedicine application, verified that I was speaking with the correct person using two identifiers, and that the patient was in a private environment conducive to confidentiality.  The patient agreed to proceed.  Persons participating in visit were patient and provider (registered dietitian) [name], PhD, RD, LDN, CEDRD.  Provider was located at [facility] during this telehealth encounter; patient was at home.  Appt start time: 0930 end time: 1030 (1 hour)  Reason for telehealth visit: Referred for Medical Nutrition Therapy related to anorexia nervosa, restricting type (F50.01).  Relevant history/background: [name] continues to have anxiety about her weight, and although she establishes goals to nourish herself better and to exercise, she has been eating inadequately for a very long time now.  Still suffering from chronic insomnia as well.    Assessment: [name] feels alienated from her family, who have not contacted her for weeks.  She has been isolated at home, going only to the grocery store and to appts with therapist Rea [employer] twice a month.  [name] has experienced some changes in taste recently; all foods and beverages, even water, taste sour/tart.  Other abberations in senses:  For the past few months, she has occasionally perceived the smell of cigarette smoke, and hears voices a couple times a weeks, something she'd not experienced for the past 16 years.   Weight: 94-96 lb recently (self-report).  Weight was 126 lb just 4 months ago (08/16/20).   Usual eating pattern: Eating 2 meals and 1-2 snacks per day.   Physical activity: None currently.  Sleep:  Estimates 4-5 hrs sleep/night. Taking 3 mg eszopiclone (Lunesta) nightly and 60 mg lirasadone (Latuda), which causes tremors.  Taking 1 mg benztropine to manage tremors.  24-hr recall suggests intake of ~1160 kcal:  (Up at 8 AM) B (8:15 AM)-  5 oz Grk fruit yogurt, 1/2 c Grapenuts, water  370 Snk ( AM)-  --- L (1 PM)-  1 cheese sandwich, side salad, f-f drsng, water 290 Snk (4 PM)-  2 graham cracker rectangles, 1 tbsp peanut butter  220 D (5 PM)-  1 c canned tomato soup, 10 saltines            2680 Snk ( PM)-  --- Typical day? Yes.   Drinking Ensure Plus about 2 X per week.    Previous kcal intake estimates: 08/29/20:  870  07/31/20: 875  06/29/20:  850  04/20/20:  700 03/23/20:           650 02/21/20 625 01/17/20 570 12/13/19 500  Intervention: Reviewed recent diet and exercise history, and encouraged pushing herself to eat even when appetite is poor and taste perception is off.    For recommendations and goals, see Patient Instructions.    Follow-up: 4 weeks.   Becky [name],Becky [name]

## 2020-12-28 NOTE — Patient Instructions (Addendum)
Goals:   1. Eat at least 3 meals a day and 1-2 snacks, starting within the first hour of getting up.     - Breakfast:  Yogurt, cereal, nuts/seeds such as sunflower seeds, cashews or almonds, fresh fruit or frozen blueberries.    2. At least 3 times per day, include a protein source, fruit and/or vegetable,  plus a carb food, e.g., bread, cereal, crackers, cookies.   - Protein foods: soy burgers, hummus, other "fake meats," cheese, yogurt, beans (any variety), lentils, homemade smoothies with milk or yogurt or tofu, Ensure Plus   Examples of meals that meet the minimum requirements: Breakfast:        2 pieces toast with peanut butter, 1 Greek yogurt, and fruit   5 oz [place] yogurt, 1/2 c Grape nuts, sunflower seeds, and fruit  Lunch:             Cheese or peanut butter sandwich, fruit and/or vegetables  Dinner:            1 soy cheeseburger on bun, salad or cooked veg (from frozen)   1 cup beans, 1 cup rice, cooked vegetables   Lentil or black bean soup with added (frozen) veg's, crackers Snack options:  Any fruit, string cheese, yogurt, 1/4 c nuts, 1/2 peanut butter sandwich (1-2 tbsp peanut butter), cereal with yogurt, crackers with cheese or peanut butter, Corn chips or pita with hummus, 1 Ensure Plus.     Follow-up video visit on Thursday, December 15 at 10 AM.  I will send a link via email and text at the time of your appointment.   Call to change appt type if you prefer to come in person: [phone] (or email - [email or web address]).

## 2021-01-02 ENCOUNTER — Telehealth: Payer: Medicare Other | Admitting: Family Medicine

## 2021-01-23 NOTE — Progress Notes (Signed)
Telehealth Encounter for Medical Nutrition Therapy (MNT)  PCP: Kathryne Eriksson, MD Neurologist: Margette Fast, MD Therapist: Rea [employer], APRN, MSN, CS  Psychiatrist: [name], MD I connected with Becky [name] (MRN [id number]) on 01/24/2021 by phone (patient unable to get online), verified that I was speaking with the correct person using two identifiers, and that the patient was in a private environment conducive to confidentiality.  The patient agreed to proceed.  Persons participating in visit were patient and provider (registered dietitian) [name], PhD, RD, LDN, CEDRD.  Provider was located at [facility] during this telehealth encounter; patient was at home.  Appt start time: 1000 end time: 1100 (1 hour)  Reason for telehealth visit: Referred for Medical Nutrition Therapy related to anorexia nervosa, restricting type (F50.01).  Relevant history/background: Becky [name] understands that her weight loss over the past several months has been excessive, although she still struggles with making herself eat.  Still suffering from chronic insomnia as well.    Assessment: Becky [name]'s father died a couple of weeks ago, which has been emotional for Becky [name].  They plan a memorial service for Becky [name].  Becky [name] has lost some of her her bottom front teeth, and has been experiencing pain, with lesions, which has limited what she can eat.  She said she called her dentist, but they offered only to clean her teeth - ? -  Becky [name] has discussed with her therapist Rea College going to a treatment center in [place] after the holidays.  She sounded committed to this plan.   Although Becky [name] said she is eating more, and making nutritious choices, diet history indicates no improved intake, and weight of 93 lb suggests ongoing insufficient intake.   Weight: 93 lb today (self-report).  Weight was 126 lb on 08/16/20.   Usual eating pattern: 2 meals and 0-1 snack per day.    Physical activity: None currently.  Sleep: Estimates 4-5 hrs sleep/night. Discontinued eszopiclone (Lunesta) nightly; now taking Abilify 5 mg. Tapering lirasadone (Latuda) for the next two weeks (and 1 mg benztropine to manage tremors from lirasadone).  24-hr recall suggests intake of ~720 kcal:  (Up at 7 AM) B (7:15 AM)-  1 bottle Boost (240 kcal, 10 g protein) 240 Snk ( AM)-  water L (12 PM)-  1 package peanut butter Nabs   180 Snk ( PM)-  Water      300 D (5 PM)-  1 baked potato, 1/4 c cheese, 1/2 c broccoli, water Snk ( PM)-  water Typical day? Yes.    Previous kcal intake estimates: 11/17:   1160 08/29/20:    870  07/31/20:   875  06/29/20:    850  04/20/20:    700 03/23/20:             650 02/21/20   625 01/17/20   570 12/13/19   500  Intervention: Reviewed recent diet and exercise history, and encouraged getting a dentist appt asap.  Also endorsed patient's plan of residential treatment.    For recommendations and goals, see Patient Instructions.    Follow-up: 8 weeks.   Becky [name],[name]

## 2021-01-24 ENCOUNTER — Ambulatory Visit (INDEPENDENT_AMBULATORY_CARE_PROVIDER_SITE_OTHER): Payer: Medicare Other | Admitting: Family Medicine

## 2021-01-24 DIAGNOSIS — F5001 Anorexia nervosa, restricting type: Secondary | ICD-10-CM

## 2021-01-24 NOTE — Patient Instructions (Addendum)
Call for a dentist appt today ASAP, using the dentist your sister recommended.    Goals remain the same:   1. Eat at least 3 meals a day and 1-2 snacks, starting within the first hour of getting up.     - Breakfast:  Yogurt, cereal, nuts/seeds such as sunflower seeds, cashews or almonds, fresh fruit or frozen blueberries.   2. At least 3 times per day, include a protein source, fruit and/or vegetable,  plus a carb food, e.g., bread, cereal, crackers, cookies.   - Protein foods: soy burgers, hummus, other "fake meats," cheese, yogurt, beans (any variety), lentils, homemade smoothies with milk or yogurt or tofu, Ensure Plus   Examples of meals that meet the minimum requirements: Breakfast:        2 pieces toast with peanut butter, 1 Greek yogurt, and fruit                         5 oz [place] yogurt, 1/2 c Grape nuts, sunflower seeds, and fruit   Lunch:             Cheese or peanut butter sandwich, fruit and/or vegetables   Dinner:            1 soy cheeseburger on bun, salad or cooked veg (from frozen)                         1 cup beans, 1 cup rice, cooked vegetables                         Lentil or black bean soup with added (frozen) veg's, crackers Snack options:  Any fruit, string cheese, yogurt, 1/4 c nuts, 1/2 peanut butter sandwich (1-2 tbsp peanut butter), cereal with yogurt, crackers with cheese or peanut butter, Corn chips or pita with hummus, 1 Ensure Plus.     Follow-up video visit on Monday, Feb 20 at 11 AM.

## 2021-01-25 ENCOUNTER — Telehealth: Payer: Medicare Other | Admitting: Family Medicine

## 2021-02-11 DIAGNOSIS — A048 Other specified bacterial intestinal infections: Secondary | ICD-10-CM

## 2021-02-11 HISTORY — DX: Other specified bacterial intestinal infections: A04.8

## 2021-02-27 ENCOUNTER — Ambulatory Visit: Payer: Medicare Other | Admitting: Diagnostic Neuroimaging

## 2021-02-27 ENCOUNTER — Telehealth: Payer: Self-pay | Admitting: Neurology

## 2021-02-27 NOTE — Telephone Encounter (Signed)
Pt cancelling appt due to can not drive, having some dizziness. Ask pt if she needs to go to the ED. Pt said no do not need to go.

## 2021-03-04 ENCOUNTER — Other Ambulatory Visit: Payer: Self-pay

## 2021-03-04 ENCOUNTER — Encounter (HOSPITAL_COMMUNITY): Payer: Self-pay | Admitting: Emergency Medicine

## 2021-03-04 ENCOUNTER — Emergency Department (HOSPITAL_COMMUNITY)
Admission: EM | Admit: 2021-03-04 | Discharge: 2021-03-04 | Disposition: A | Discharge status: Home / Self Care | Payer: Medicare Other | Attending: Emergency Medicine | Admitting: Emergency Medicine

## 2021-03-04 DIAGNOSIS — R5383 Other fatigue: Secondary | ICD-10-CM | POA: Insufficient documentation

## 2021-03-04 DIAGNOSIS — F509 Eating disorder, unspecified: Secondary | ICD-10-CM | POA: Insufficient documentation

## 2021-03-04 DIAGNOSIS — R630 Anorexia: Secondary | ICD-10-CM

## 2021-03-04 DIAGNOSIS — Z79899 Other long term (current) drug therapy: Secondary | ICD-10-CM | POA: Insufficient documentation

## 2021-03-04 LAB — COMPREHENSIVE METABOLIC PANEL
ALT: 9 U/L (ref 0–44)
AST: 15 U/L (ref 15–41)
Albumin: 3.6 g/dL (ref 3.5–5.0)
Alkaline Phosphatase: 57 U/L (ref 38–126)
Anion gap: 8 (ref 5–15)
BUN: 17 mg/dL (ref 8–23)
CO2: 29 mmol/L (ref 22–32)
Calcium: 8.9 mg/dL (ref 8.9–10.3)
Chloride: 99 mmol/L (ref 98–111)
Creatinine, Ser: 0.91 mg/dL (ref 0.44–1.00)
GFR, Estimated: >60 mL/min (ref >60)
Glucose, Bld: 85 mg/dL (ref 70–99)
Potassium: 3.6 mmol/L (ref 3.5–5.1)
Sodium: 136 mmol/L (ref 135–145)
Total Bilirubin: 0.7 mg/dL (ref 0.3–1.2)
Total Protein: 6 g/dL — ABNORMAL LOW (ref 6.5–8.1)

## 2021-03-04 LAB — CBC WITH DIFFERENTIAL/PLATELET
Abs Immature Granulocytes: 0.01 10*3/uL (ref 0.00–0.07)
Basophils Absolute: 0 10*3/uL (ref 0.0–0.1)
Basophils Relative: 0 %
Eosinophils Absolute: 0.1 10*3/uL (ref 0.0–0.5)
Eosinophils Relative: 2 %
HCT: 36.9 % (ref 36.0–46.0)
Hemoglobin: 11.8 g/dL — ABNORMAL LOW (ref 12.0–15.0)
Immature Granulocytes: 0 %
Lymphocytes Relative: 25 %
Lymphs Abs: 1.2 10*3/uL (ref 0.7–4.0)
MCH: 30.6 pg (ref 26.0–34.0)
MCHC: 32 g/dL (ref 30.0–36.0)
MCV: 95.8 fL (ref 80.0–100.0)
Monocytes Absolute: 0.5 10*3/uL (ref 0.1–1.0)
Monocytes Relative: 10 %
Neutro Abs: 3 10*3/uL (ref 1.7–7.7)
Neutrophils Relative %: 63 %
Platelets: 232 10*3/uL (ref 150–400)
RBC: 3.85 MIL/uL — ABNORMAL LOW (ref 3.87–5.11)
RDW: 13 % (ref 11.5–15.5)
WBC: 4.8 10*3/uL (ref 4.0–10.5)
nRBC: 0 % (ref 0.0–0.2)

## 2021-03-04 LAB — PHOSPHORUS: Phosphorus: 4 mg/dL (ref 2.5–4.6)

## 2021-03-04 LAB — MAGNESIUM: Magnesium: 1.8 mg/dL (ref 1.7–2.4)

## 2021-03-04 NOTE — Discharge Instructions (Addendum)
Your work-up in the ER today was reassuring for acute abnormalities.  As we discussed I would like for you to follow-up with GI so they can evaluate you and determine how best to manage your eating disorder moving forward. I have also given you information for the [facility] for Eating Disorders which you can call for resources as well.  Return if development of any new or worsening symptoms

## 2021-03-04 NOTE — ED Provider Notes (Signed)
[facility] DEPT Provider Note   CSN: [id number] Arrival date & time: 03/04/21  1412     History  Chief Complaint  Patient presents with   Eating Disorder    Becky [name] is a 66 y.o. female.  Patient with history of anorexia, DID, depression, and anxiety who presents today from her psychiatrist with concerns for significant amount of weight loss in the last 6 months. She states that she has been having significant amounts of depression related to her fathers death recently and has therefore not been eating adequately. She states that she eats 1 veggie burger per day and sometimes goes days without eating. She states that she has lost 60 lbs in the past 6 months and now weighs around 85 lbs. She spoke to her psychiatrist today who recommended that she come here for evaluation. She states that she has had a PEG tube in the past with significant improvement in her symptoms in her 63s and feels that she would benefit from having one again. She endorses associated fatigue, no other complaints at this time. She denies SI/HI.  The history is provided by the patient. No language interpreter was used.      Home Medications Prior to Admission medications   Medication Sig Start Date End Date Taking? Authorizing Provider  acetaminophen (TYLENOL) 500 MG tablet Take 1,000 mg by mouth every 6 (six) hours as needed for mild pain.    [provider]  albuterol (PROAIR HFA) 108 (90 BASE) MCG/ACT inhaler INHALE 2 PUFFS INTO THE LUNGS EVERY 6 HOURS AS NEEDED Patient taking differently: Inhale 2 puffs into the lungs every 6 (six) hours as needed for shortness of breath or wheezing. 03/01/13   [name], MD  benztropine (COGENTIN) 2 MG tablet Take 2 mg by mouth 2 (two) times daily. 09/11/20   [provider]  buPROPion (WELLBUTRIN XL) 150 MG 24 hr tablet Take 450 mg by mouth every morning.    [provider]  Cyanocobalamin (VITAMIN  B-12 PO) Take by mouth.    [provider]  DULoxetine (CYMBALTA) 60 MG capsule Take 120 mg by mouth daily. 10/20/14   [provider]  EPIPEN 2-PAK 0.3 MG/0.3ML SOAJ injection See admin instructions. 04/05/14   [provider]  Eszopiclone 3 MG TABS Take 3 mg by mouth at bedtime as needed (sleep). Use if not asleep within 30 minutes. 06/05/19   [provider]  feeding supplement (ENSURE ENLIVE / ENSURE PLUS) LIQD Take 237 mLs by mouth 2 (two) times daily with a meal. 10/07/20   Dahal, Marlowe Aschoff, MD  ferrous sulfate 325 (65 FE) MG tablet Take 325 mg by mouth daily with breakfast.    [provider]  ibuprofen (ADVIL,MOTRIN) 200 MG tablet Take 400 mg by mouth daily as needed for headache or mild pain.    [provider]  LATUDA 60 MG TABS Take 120 mg by mouth at bedtime. 10/02/20   [provider]  Melatonin 10 MG CAPS Take 10 mg by mouth at bedtime as needed (sleep).    [provider]  metFORMIN (GLUCOPHAGE-XR) 500 MG 24 hr tablet Take 500 mg by mouth every evening. 04/12/19   [provider]  omeprazole (PRILOSEC) 40 MG capsule Take 40 mg by mouth daily. Patient not taking: Reported on 10/31/2020 04/19/19   [provider]  QUEtiapine (SEROQUEL) 50 MG tablet Take 100 mg by mouth at bedtime. 07/20/19   [provider]  traZODone (DESYREL)  50 MG tablet Take 150 mg by mouth at bedtime as needed for sleep.    [provider]  VITAMIN D PO Take by mouth.    [provider]      Allergies    Methadone, Nitrofurantoin, Oxycodone-acetaminophen, Percocet [oxycodone-acetaminophen], Penicillins, Amoxicillin, Aspirin, Clarithromycin, Codeine, Hydrocodone-acetaminophen, Morphine, Ms contin [morphine sulfate], and Propoxyphene    Review of Systems   Review of Systems  Constitutional:  Positive for appetite change. Negative for chills and fever.  Respiratory:  Negative for shortness of breath.    Cardiovascular:  Negative for chest pain.  Gastrointestinal:  Negative for abdominal pain, diarrhea, nausea and vomiting.  Neurological:  Negative for headaches.  Psychiatric/Behavioral:  Negative for self-injury and suicidal ideas.   All other systems reviewed and are negative.  Physical Exam Updated Vital Signs BP 108/72    Pulse 86    Temp (!) 97.3 F (36.3 C) (Oral)    Resp 10    Ht 5' (1.524 m)    Wt 45 kg    SpO2 100%    BMI 19.38 kg/m  Physical Exam Vitals and nursing note reviewed.  Constitutional:      Appearance: Normal appearance.     Comments: Patient very thin and frail appearing, resting comfortably in bed in no acute distress   Eyes:     Extraocular Movements: Extraocular movements intact.     Pupils: Pupils are equal, round, and reactive to light.  Cardiovascular:     Rate and Rhythm: Normal rate and regular rhythm.     Heart sounds: Normal heart sounds.  Pulmonary:     Effort: Pulmonary effort is normal. No respiratory distress.     Breath sounds: Normal breath sounds.  Abdominal:     General: Abdomen is flat.     Palpations: Abdomen is soft.     Tenderness: There is no abdominal tenderness.  Musculoskeletal:        General: Normal range of motion.     Cervical back: Normal range of motion.  Skin:    General: Skin is warm and dry.  Neurological:     General: No focal deficit present.     Mental Status: She is alert.  Psychiatric:        Mood and Affect: Mood normal.        Behavior: Behavior normal.    ED Results / Procedures / Treatments   Labs (all labs ordered are listed, but only abnormal results are displayed) Labs Reviewed  COMPREHENSIVE METABOLIC PANEL - Abnormal; Notable for the following components:      Result Value   Total Protein 6.0 (*)    All other components within normal limits  CBC WITH DIFFERENTIAL/PLATELET - Abnormal; Notable for the following components:   RBC 3.85 (*)    Hemoglobin 11.8 (*)    All other components within  normal limits  MAGNESIUM  PHOSPHORUS    EKG None  Radiology No results found.  Procedures Procedures    Medications Ordered in ED Medications - No data to display  ED Course/ Medical Decision Making/ A&P                           Medical Decision Making Amount and/or Complexity of Data Reviewed Labs: ordered.   This patient presents to the ED for concern of complications from anorexia   Co morbidities that complicate the patient evaluation  Depression, anxiety, DID, anorexia   Additional history obtained:  Additional history obtained from the patients psychotherapist Hortense Ramal who I talked to on the phone as she was the one who referred her to come here. She states that she was concerned about electrolyte abnormalities and kidney function and was hoping the patient could receive inpatient admission with PEG tube placement   Lab Tests:  I Ordered, and personally interpreted labs.  The pertinent results include:  no electrolyte abnormalities, no leukocytosis, normal magnesium and phosphorus   Cardiac Monitoring:  The patient was maintained on a cardiac monitor.  I personally viewed and interpreted the cardiac monitored which showed an underlying rhythm of: sinus rhythm   Dispostion:  After consideration of the diagnostic results and the patients response to treatment, I feel that the patent would benefit from outpatient referral for anorexia management.  Patient presents today with significant weight loss in the last 6 months related to not eating/anorexia. Hx of same requiring PEG tube in her 70s. Patient with benign lab results not indicative of acute starvation, therefore there is no indication for medical admission at this time. I have offered TTS consult which patient declines. She is not suicidal or homicidal at this time and is behaving normally without current psych concerns.  Is afebrile, nontoxic-appearing, and in no acute distress with reassuring  vital signs.  Will give GI referral and information about the [facility] for Eating Disorders for her to call for further management of her symptoms. She is understanding, educated on red flag symptoms that would prompt immediate return. Discharged in stable condition.  Findings and plan of care discussed with supervising physician Dr. [name] who is in agreement.    Final Clinical Impression(s) / ED Diagnoses Final diagnoses:  Anorexia    Rx / DC Orders ED Discharge Orders     None     An After Visit Summary was printed and given to the patient.     Bud Face, PA-C 03/04/21 1641    Lorelle Gibbs, DO 03/05/21 2158

## 2021-03-04 NOTE — ED Provider Triage Note (Signed)
Emergency Medicine Provider Triage Evaluation Note  Becky [name] , a 66 y.o. female  was evaluated in triage.  Pt complains of fatigue and weight loss.  History of anorexia, sent by psychiatrist to the ED for "emergency PEG tube".  Patient reports 30 pound weight loss in 4 months.  History of needing a PEG tube in her 49s for this condition.  Review of Systems  Positive: fatigue Negative: Abdominal pain, syncope, SI, HI  Physical Exam  BP 120/71    Pulse 86    Temp (!) 97.3 F (36.3 C) (Oral)    Resp 16    Ht 5' (1.524 m)    Wt 45 kg    SpO2 100%    BMI 19.38 kg/m  Gen:   Awake, no distress.  Patient is pale Resp:  Normal effort  MSK:   Moves extremities without difficulty  Other:  Abdomen soft, nontender.  Medical Decision Making  Medically screening exam initiated at 2:34 PM.  Appropriate orders placed.  [name] was informed that the remainder of the evaluation will be completed by another provider, this initial triage assessment does not replace that evaluation, and the importance of remaining in the ED until their evaluation is complete.  Basic labs and ekg   Sherrill Raring, [place] 03/04/21 1435

## 2021-03-04 NOTE — ED Triage Notes (Signed)
Referred by therapist, has lost about 80 lbs over the past year, presents to the ED because she has lost too much weight. Hx of anorexia. Therapist would like feeding tube and psych admission. Denies SI/HI.

## 2021-03-20 ENCOUNTER — Encounter: Payer: Self-pay | Admitting: Physician Assistant

## 2021-03-22 ENCOUNTER — Telehealth: Payer: Self-pay | Admitting: Physician Assistant

## 2021-03-22 NOTE — Telephone Encounter (Signed)
Rea College, Psychiatric CNS (patient's therapist), needs to speak with Estill Bamberg regarding patient.  She said her history and case is complicated and she wanted to explain it to her as well as find out if she wants notes/records on patient.  Please have Estill Bamberg call her at [phone].  Thank you.

## 2021-03-23 NOTE — Telephone Encounter (Signed)
[place], therapist.  Patient has history of eating disorder, anorexia.  She has had a feeding tube in her 20's, has not had an issue since that time.  She has weighted and maintained 130lbs for 15 years, started to lose weight in Dec, now at 80 lbs.  She admits to anorexia and has been getting intensive therapy, could not find a placement.  Patient is requesting a feeding tube due to poor nutrition.  Asking what do to for a feeding tube, after reviewing information, suggest going to ER for possible admission for evaluation for feeding tube with IR.   No AB imaging recently.   03/04/2021 HGB 11.8 MCV 95.8 with macrocytic anemia WBC 4.8 Platelets 232 03/04/2021 BUN 17 Cr 0.91  GFR >60  Potassium 3.6  Magnesium 1.8   03/04/2021 AST 15 ALT 9 Alkphos 57 TBili 0.7 Albumin 3.6, was 3.2 in Dec  Colonoscopy and EGD 03/2015 Dr. Amedeo Plenty- EGD for gastroparesis? 6x28mm polyp hyperplastic polyp mild gastritis, previous fundoplication, otherwise normal study. Status post botulinum toxin injection.  UGI with small bowel 04/2010 1.  Previous Nissen fundoplication.  No evidence of hiatal hernia,  reflux, or stricture.  2.  Mild esophageal dysmotility.  3.  Normal small bowel follow-through  CT AB pelvis 2012 for AB pain No acute findings or other significant abnormality.  No evidence of  recurrent hernia or mass.

## 2021-03-26 ENCOUNTER — Emergency Department (HOSPITAL_COMMUNITY)
Admission: EM | Admit: 2021-03-26 | Discharge: 2021-03-27 | Disposition: A | Discharge status: Home / Self Care | Payer: Medicare Other | Attending: Emergency Medicine | Admitting: Emergency Medicine

## 2021-03-26 ENCOUNTER — Other Ambulatory Visit: Payer: Self-pay

## 2021-03-26 ENCOUNTER — Encounter (HOSPITAL_COMMUNITY): Payer: Self-pay

## 2021-03-26 ENCOUNTER — Telehealth: Payer: Self-pay | Admitting: Internal Medicine

## 2021-03-26 DIAGNOSIS — Z20822 Contact with and (suspected) exposure to covid-19: Secondary | ICD-10-CM | POA: Diagnosis not present

## 2021-03-26 DIAGNOSIS — E1143 Type 2 diabetes mellitus with diabetic autonomic (poly)neuropathy: Secondary | ICD-10-CM | POA: Insufficient documentation

## 2021-03-26 DIAGNOSIS — R634 Abnormal weight loss: Secondary | ICD-10-CM | POA: Diagnosis present

## 2021-03-26 DIAGNOSIS — F5000 Anorexia nervosa, unspecified: Secondary | ICD-10-CM | POA: Diagnosis not present

## 2021-03-26 DIAGNOSIS — Z7984 Long term (current) use of oral hypoglycemic drugs: Secondary | ICD-10-CM | POA: Insufficient documentation

## 2021-03-26 DIAGNOSIS — Z7951 Long term (current) use of inhaled steroids: Secondary | ICD-10-CM | POA: Insufficient documentation

## 2021-03-26 DIAGNOSIS — F418 Other specified anxiety disorders: Secondary | ICD-10-CM | POA: Insufficient documentation

## 2021-03-26 DIAGNOSIS — Z79899 Other long term (current) drug therapy: Secondary | ICD-10-CM | POA: Insufficient documentation

## 2021-03-26 DIAGNOSIS — J4540 Moderate persistent asthma, uncomplicated: Secondary | ICD-10-CM | POA: Insufficient documentation

## 2021-03-26 DIAGNOSIS — R410 Disorientation, unspecified: Secondary | ICD-10-CM | POA: Diagnosis not present

## 2021-03-26 DIAGNOSIS — I10 Essential (primary) hypertension: Secondary | ICD-10-CM | POA: Diagnosis not present

## 2021-03-26 DIAGNOSIS — E46 Unspecified protein-calorie malnutrition: Secondary | ICD-10-CM | POA: Diagnosis not present

## 2021-03-26 DIAGNOSIS — Z87891 Personal history of nicotine dependence: Secondary | ICD-10-CM | POA: Insufficient documentation

## 2021-03-26 LAB — COMPREHENSIVE METABOLIC PANEL
ALT: 10 U/L (ref 0–44)
AST: 16 U/L (ref 15–41)
Albumin: 4 g/dL (ref 3.5–5.0)
Alkaline Phosphatase: 61 U/L (ref 38–126)
Anion gap: 6 (ref 5–15)
BUN: 17 mg/dL (ref 8–23)
CO2: 29 mmol/L (ref 22–32)
Calcium: 9.4 mg/dL (ref 8.9–10.3)
Chloride: 101 mmol/L (ref 98–111)
Creatinine, Ser: 0.93 mg/dL (ref 0.44–1.00)
GFR, Estimated: >60 mL/min (ref >60)
Glucose, Bld: 116 mg/dL — ABNORMAL HIGH (ref 70–99)
Potassium: 3.6 mmol/L (ref 3.5–5.1)
Sodium: 136 mmol/L (ref 135–145)
Total Bilirubin: 0.2 mg/dL — ABNORMAL LOW (ref 0.3–1.2)
Total Protein: 6.5 g/dL (ref 6.5–8.1)

## 2021-03-26 LAB — URINALYSIS, ROUTINE W REFLEX MICROSCOPIC
Bilirubin Urine: NEGATIVE
Glucose, UA: NEGATIVE mg/dL
Hgb urine dipstick: NEGATIVE
Ketones, ur: 5 mg/dL — AB
Leukocytes,Ua: NEGATIVE
Nitrite: NEGATIVE
Protein, ur: NEGATIVE mg/dL
Specific Gravity, Urine: 1.023 (ref 1.005–1.030)
pH: 5 (ref 5.0–8.0)

## 2021-03-26 LAB — RESP PANEL BY RT-PCR (FLU A&B, COVID) ARPGX2
Influenza A by PCR: NEGATIVE
Influenza B by PCR: NEGATIVE
SARS Coronavirus 2 by RT PCR: NEGATIVE

## 2021-03-26 LAB — CBC
HCT: 35.9 % — ABNORMAL LOW (ref 36.0–46.0)
Hemoglobin: 11.7 g/dL — ABNORMAL LOW (ref 12.0–15.0)
MCH: 31 pg (ref 26.0–34.0)
MCHC: 32.6 g/dL (ref 30.0–36.0)
MCV: 95.2 fL (ref 80.0–100.0)
Platelets: 215 10*3/uL (ref 150–400)
RBC: 3.77 MIL/uL — ABNORMAL LOW (ref 3.87–5.11)
RDW: 13.2 % (ref 11.5–15.5)
WBC: 4.5 10*3/uL (ref 4.0–10.5)
nRBC: 0 % (ref 0.0–0.2)

## 2021-03-26 LAB — RAPID URINE DRUG SCREEN, HOSP PERFORMED
Amphetamines: NOT DETECTED
Barbiturates: NOT DETECTED
Benzodiazepines: NOT DETECTED
Cocaine: NOT DETECTED
Opiates: NOT DETECTED
Tetrahydrocannabinol: NOT DETECTED

## 2021-03-26 MED ORDER — BENZTROPINE MESYLATE 0.5 MG PO TABS
2.0000 mg | ORAL_TABLET | Freq: Two times a day (BID) | ORAL | Status: DC
  Administered 2021-03-27 (×2): 2 mg via ORAL
  Filled 2021-03-26 (×2): qty 4

## 2021-03-26 MED ORDER — LURASIDONE HCL 60 MG PO TABS: 120.0000 mg | ORAL_TABLET | Freq: Every day | ORAL | Status: DC

## 2021-03-26 MED ORDER — QUETIAPINE FUMARATE 100 MG PO TABS
100.0000 mg | ORAL_TABLET | Freq: Every day | ORAL | Status: DC
  Administered 2021-03-27: 100 mg via ORAL
  Filled 2021-03-26: qty 1

## 2021-03-26 MED ORDER — MELATONIN 5 MG PO TABS: 10.0000 mg | ORAL_TABLET | Freq: Every evening | ORAL | Status: DC | PRN

## 2021-03-26 MED ORDER — ACETAMINOPHEN 500 MG PO TABS: 1000.0000 mg | ORAL_TABLET | Freq: Four times a day (QID) | ORAL | Status: DC | PRN

## 2021-03-26 MED ORDER — BUPROPION HCL ER (XL) 150 MG PO TB24
450.0000 mg | ORAL_TABLET | Freq: Every morning | ORAL | Status: DC
  Administered 2021-03-27: 450 mg via ORAL
  Filled 2021-03-26: qty 3

## 2021-03-26 MED ORDER — METFORMIN HCL ER 500 MG PO TB24
500.0000 mg | ORAL_TABLET | Freq: Every day | ORAL | Status: DC
  Administered 2021-03-27: 500 mg via ORAL
  Filled 2021-03-26 (×2): qty 1

## 2021-03-26 MED ORDER — TRAZODONE HCL 100 MG PO TABS: 150.0000 mg | ORAL_TABLET | Freq: Every evening | ORAL | Status: DC | PRN

## 2021-03-26 MED ORDER — ENSURE ENLIVE PO LIQD
237.0000 mL | Freq: Two times a day (BID) | ORAL | Status: DC
  Administered 2021-03-27 (×2): 237 mL via ORAL
  Filled 2021-03-26 (×4): qty 237

## 2021-03-26 MED ORDER — ALBUTEROL SULFATE HFA 108 (90 BASE) MCG/ACT IN AERS: 2.0000 | INHALATION_SPRAY | Freq: Four times a day (QID) | RESPIRATORY_TRACT | Status: DC | PRN

## 2021-03-26 MED ORDER — ZOLPIDEM TARTRATE 5 MG PO TABS: 5.0000 mg | ORAL_TABLET | Freq: Every evening | ORAL | Status: DC | PRN

## 2021-03-26 MED ORDER — DULOXETINE HCL 30 MG PO CPEP
120.0000 mg | ORAL_CAPSULE | Freq: Every day | ORAL | Status: DC
  Administered 2021-03-27: 120 mg via ORAL
  Filled 2021-03-26: qty 4

## 2021-03-26 NOTE — Telephone Encounter (Signed)
Spoke to Dr. Florene Glen of [facility] hospitalist regarding this patient.  Patient has a long history of anorexia nervosa and eating disorder.  There was some contact with one of our advanced practitioners through psychology recently and the patient has been trying to get inpatient care for her eating disorder and is interested in having a gastrostomy tube for nutrition placed.  Our PA suggested she present to the ER for admission for IR to place a tube if that was to happen.  The patient is in the ER now.  I agree that psychiatry evaluation makes sense.  I would defer to their judgment regarding treatment.  Perhaps an inpatient program would come available.  I cannot make a specific recommendation about gastrostomy tube placement based upon my knowledge of the situation at this time though that could be a reasonable option.

## 2021-03-26 NOTE — Treatment Plan (Addendum)
Called for admission regarding need for PEG due to poor po intake/weight loss over the past several months.  Listed weight in chart is 43 kg (this has not changed per last weights -> request updated weight in chart).  Reported 50 lbs weight loss over past few months.  I discussed with Becky [name], outpatient therapist who has been working on getting her into treatment for her anorexia for the past few months without success.  She notes she's eating toast with cheese in the morning and maybe Becky [name] baked potato with cheese, not really drinking ensures she's being instructed to take.  She was seen and discharged from ED 1/22 after requesting PEG tube with referral to gastroenterology and information on [facility] for eating disorders.  GI left telephone note on 2/10 and recommended coming to ED for feeding tube.    Dr. Eulis Foster called requesting admission in setting of above.  Labs and vitals at this time are benign, I'm not sure if her weights are updated.  Not sure she requires inpatient PEG (outpatient would seem to be reasonable?) at this time given her labs/vitals/recorded weight, would recommend discussion with psychiatry and gastroenterology Becky [name]) to help clarify best next steps.    Addendum: discussed with GI, see Becky [name] note.  Appreciate assistance.  Will defer to psych at this time.  Admission currently deferred.  Please let us know when and if we can be of further assistance.  [facility].  Wt Readings from Last 3 Encounters:  03/26/21 43.3 kg  03/04/21 45 kg  12/01/20 45.2 kg

## 2021-03-26 NOTE — ED Triage Notes (Signed)
Patient reports that she has anorexia and has not been eating properly for the past 6-7 months. Patient states she has lost approx 50 lbs during this time. Patient reports that she had this problem when she was in her 87's.

## 2021-03-26 NOTE — ED Provider Notes (Signed)
[facility] DEPT Provider Note   CSN: [id number] Arrival date & time: 03/26/21  4081     History  Chief Complaint  Patient presents with   Eating Disorder    Becky [name] is a 66 y.o. female.  HPI Patient presents to the ED for evaluation of weight loss of 80 pounds over several months, caused by her anorexia nervosa.  She has been seeing psychiatry, therapist, nutritionist, PCP and GI.  Due to these providers have been concerned about dramatic weight loss and worsening despite outpatient interventions.    Home Medications Prior to Admission medications   Medication Sig Start Date End Date Taking? Authorizing Provider  acetaminophen (TYLENOL) 500 MG tablet Take 1,000 mg by mouth every 6 (six) hours as needed for mild pain.    [provider]  albuterol (PROAIR HFA) 108 (90 BASE) MCG/ACT inhaler INHALE 2 PUFFS INTO THE LUNGS EVERY 6 HOURS AS NEEDED Patient taking differently: Inhale 2 puffs into the lungs every 6 (six) hours as needed for shortness of breath or wheezing. 03/01/13   [name], MD  benztropine (COGENTIN) 2 MG tablet Take 2 mg by mouth 2 (two) times daily. 09/11/20   [provider]  buPROPion (WELLBUTRIN XL) 150 MG 24 hr tablet Take 450 mg by mouth every morning.    [provider]  Cyanocobalamin (VITAMIN B-12 PO) Take by mouth.    [provider]  DULoxetine (CYMBALTA) 60 MG capsule Take 120 mg by mouth daily. 10/20/14   [provider]  EPIPEN 2-PAK 0.3 MG/0.3ML SOAJ injection See admin instructions. 04/05/14   [provider]  Eszopiclone 3 MG TABS Take 3 mg by mouth at bedtime as needed (sleep). Use if not asleep within 30 minutes. 06/05/19   [provider]  feeding supplement (ENSURE ENLIVE / ENSURE PLUS) LIQD Take 237 mLs by mouth 2 (two) times daily with a meal. 10/07/20   Dahal, Marlowe Aschoff, MD  ferrous sulfate 325 (65 FE) MG tablet Take 325 mg by mouth daily with  breakfast.    [provider]  ibuprofen (ADVIL,MOTRIN) 200 MG tablet Take 400 mg by mouth daily as needed for headache or mild pain.    [provider]  LATUDA 60 MG TABS Take 120 mg by mouth at bedtime. 10/02/20   [provider]  Melatonin 10 MG CAPS Take 10 mg by mouth at bedtime as needed (sleep).    [provider]  metFORMIN (GLUCOPHAGE-XR) 500 MG 24 hr tablet Take 500 mg by mouth every evening. 04/12/19   [provider]  omeprazole (PRILOSEC) 40 MG capsule Take 40 mg by mouth daily. Patient not taking: Reported on 10/31/2020 04/19/19   [provider]  QUEtiapine (SEROQUEL) 50 MG tablet Take 100 mg by mouth at bedtime. 07/20/19   [provider]  traZODone (DESYREL) 50 MG tablet Take 150 mg by mouth at bedtime as needed for sleep.    [provider]  VITAMIN D PO Take by mouth.    [provider]      Allergies    Methadone, Nitrofurantoin, Oxycodone-acetaminophen, Percocet [oxycodone-acetaminophen], Penicillins, Amoxicillin, Aspirin, Clarithromycin, Codeine, Hydrocodone-acetaminophen, Morphine, Ms contin [morphine sulfate], and Propoxyphene    Review of Systems   Review of Systems  Physical Exam Updated Vital Signs BP (!) 143/90    Pulse 77    Temp 98.4 F (36.9 C) (Oral)    Resp 18    Ht 5' (1.524 m)    Wt  41.9 kg    SpO2 97%    BMI 18.03 kg/m  Physical Exam Vitals and nursing note reviewed.  Constitutional:      General: She is not in acute distress.    Appearance: She is well-developed. She is ill-appearing. She is not toxic-appearing or diaphoretic.     Comments: Under nourished  HENT:     Head: Normocephalic and atraumatic.     Right Ear: External ear normal.     Left Ear: External ear normal.  Eyes:     Conjunctiva/sclera: Conjunctivae normal.     Pupils: Pupils are equal, round, and reactive to light.  Neck:     Trachea: Phonation normal.  Cardiovascular:     Rate and Rhythm: Normal rate  and regular rhythm.     Heart sounds: Normal heart sounds.  Pulmonary:     Effort: Pulmonary effort is normal. No respiratory distress.     Breath sounds: Normal breath sounds. No stridor.  Abdominal:     General: There is no distension.     Palpations: Abdomen is soft.     Tenderness: There is no abdominal tenderness.  Musculoskeletal:        General: Normal range of motion.     Cervical back: Normal range of motion and neck supple.  Skin:    General: Skin is warm and dry.  Neurological:     Mental Status: She is alert and oriented to person, place, and time.     Cranial Nerves: No cranial nerve deficit.     Sensory: No sensory deficit.     Motor: No abnormal muscle tone.     Coordination: Coordination normal.     Comments: No dysarthria, aphasia, nystagmus or ataxia  Psychiatric:        Mood and Affect: Mood normal.        Behavior: Behavior normal.     Comments: Mild confusion is present    ED Results / Procedures / Treatments   Labs (all labs ordered are listed, but only abnormal results are displayed) Labs Reviewed  COMPREHENSIVE METABOLIC PANEL - Abnormal; Notable for the following components:      Result Value   Glucose, Bld 116 (*)    Total Bilirubin 0.2 (*)    All other components within normal limits  CBC - Abnormal; Notable for the following components:   RBC 3.77 (*)    Hemoglobin 11.7 (*)    HCT 35.9 (*)    All other components within normal limits  URINALYSIS, ROUTINE W REFLEX MICROSCOPIC - Abnormal; Notable for the following components:   APPearance HAZY (*)    Ketones, ur 5 (*)    All other components within normal limits  RESP PANEL BY RT-PCR (FLU A&B, COVID) ARPGX2  RAPID URINE DRUG SCREEN, HOSP PERFORMED    EKG None  Radiology No results found.  Procedures Procedures    Medications Ordered in ED Medications  albuterol (VENTOLIN HFA) 108 (90 Base) MCG/ACT inhaler 2 puff (has no administration in time range)  acetaminophen (TYLENOL)  tablet 1,000 mg (has no administration in time range)  benztropine (COGENTIN) tablet 2 mg (has no administration in time range)  buPROPion (WELLBUTRIN XL) 24 hr tablet 450 mg (has no administration in time range)  DULoxetine (CYMBALTA) DR capsule 120 mg (has no administration in time range)  zolpidem (AMBIEN) tablet 5 mg (has no administration in time range)  feeding supplement (ENSURE ENLIVE / ENSURE PLUS) liquid 237 mL (has no administration in time range)  Lurasidone HCl TABS 120 mg (has no administration in time range)  Melatonin CAPS 10 mg (has no administration in time range)  metFORMIN (GLUCOPHAGE-XR) 24 hr tablet 500 mg (has no administration in time range)  QUEtiapine (SEROQUEL) tablet 100 mg (has no administration in time range)  traZODone (DESYREL) tablet 150 mg (has no administration in time range)    ED Course/ Medical Decision Making/ A&P                           Medical Decision Making Patient presenting for evaluation of severe weight loss, despite outpatient interventions.  Problems Addressed: Anorexia nervosa: chronic illness or injury that poses a threat to life or bodily functions    Details: Chronic illness Malnutrition, unspecified type ([employer]): chronic illness or injury that poses a threat to life or bodily functions    Details: Difficulty eating despite evaluation and recommendations by nutrition team Weight loss: chronic illness or injury    Details: Documented 8 pound weight loss over 6 months  Amount and/or Complexity of Data Reviewed Independent Historian:     Details: She is a somewhat poor historian. External Data Reviewed: labs, radiology and notes.    Details: Multiple recent interventions and evaluations. Labs: ordered.    Details: CBC, metabolic panel, urinalysis, viral panel-normal except hemoglobin low, ketones in urine, glucose slightly elevated Discussion of management or test interpretation with external provider(s): I discussed the case with  patient's nutritionist, Iver Nestle.  She has been actively seeing the patient.  She feels that the patient is ingesting 5 L or less calories per day, and this is adversely affecting her physical status.  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  She has been in communication with the patient's other providers, several of whom feel that the patient needs further evaluation and possibly another way to get nutrition such as feeding tube.  Case also discussed with hospitalist regarding possible admission.  Dr. Florene Glen will evaluate further with chart review.  He does not think that the patient requires hospitalization or observation admission at this time (5:05 PM).  He requests psychiatry evaluation in the ED.  Risk Prescription drug management. Decision regarding hospitalization. Risk Details: Hospitalist contacted who does not want to admit patient because of normal vital signs, and laboratory testing.  Hospitalist also communicated with gastroenterology Carlean Purl) who is in favor of feeding tube placement.  Current plan is for psychiatry evaluation for capacity, and possible admission for eating disorder causing significant nutritional problems leading to weight loss.  Patient will be observed during this time, for possible decompensation requiring hospitalization.           Final Clinical Impression(s) / ED Diagnoses Final diagnoses:  Anorexia nervosa  Malnutrition, unspecified type ([place])  Weight loss    Rx / DC Orders ED Discharge Orders     None         Daleen Bo, MD 03/26/21 2348

## 2021-03-27 ENCOUNTER — Emergency Department (HOSPITAL_COMMUNITY): Payer: Medicare Other

## 2021-03-27 DIAGNOSIS — F5000 Anorexia nervosa, unspecified: Secondary | ICD-10-CM

## 2021-03-27 DIAGNOSIS — R634 Abnormal weight loss: Secondary | ICD-10-CM | POA: Diagnosis not present

## 2021-03-27 MED ORDER — SODIUM CHLORIDE 0.9 % IV SOLN: INTRAVENOUS | Status: DC

## 2021-03-27 MED ORDER — THIAMINE HCL 100 MG PO TABS: 100.0000 mg | ORAL_TABLET | Freq: Every day | ORAL | 0 refills | Status: DC

## 2021-03-27 MED ORDER — THERA VITAL M PO TABS: 1.0000 | ORAL_TABLET | Freq: Every day | ORAL | 0 refills | Status: DC

## 2021-03-27 NOTE — ED Notes (Signed)
Pt in bed with eyes closed, resps even and unlabored, pt oriented times three, pt denies pain, pt offers no complaints at this time.

## 2021-03-27 NOTE — BH Assessment (Signed)
[facility] Assessment Progress Note   Per Sheran Fava, NP , this voluntary pt does not require psychiatric hospitalization at this time.  Pt is psychiatrically cleared.  Discharge instructions include recommendation to follow up with the [facility] for Eating Disorders.  EDP Sherwood Gambler, MD and pt's nurse, Darnelle Maffucci, have been notified.  Jalene Mullet, [facility] Triage Specialist [phone]

## 2021-03-27 NOTE — ED Notes (Signed)
Pt in bed, pt states that her car is here and she can drive herself home, pt verbalized understanding d/c instructions and follow up, script given. Pt ambulatory from dpt.
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
## 2021-03-27 NOTE — ED Notes (Signed)
Pt in bed, med given, md talking with pt about plan of care.

## 2021-03-27 NOTE — ED Provider Notes (Signed)
°  Physical Exam  BP (!) 138/92    Pulse 87    Temp 98.3 F (36.8 C) (Oral)    Resp 16    Ht 5' (1.524 m)    Wt 41.9 kg    SpO2 98%    BMI 18.03 kg/m   Physical Exam  Procedures  Procedures  ED Course / MDM    Medical Decision Making Patient cleared by psychiatry.  Patient however recommended to have a G-tube placed.  However IR is unable to put her on the schedule today.  I reviewed her chart.  Patient will need to be monitored for refeeding syndrome since she has very poor p.o. intake.  Her current labs from yesterday were unremarkable.  I ordered maintenance fluids.  I requested admission.  6:53 PM I talked to Dr. Posey Pronto who also came and evaluated the patient.  Patient is tolerating Ensure at bedside.  Patient is cleared by psychiatry to go home and does not need psych admission currently.  Psych does recommend eating disorder clinic.  Dr. Posey Pronto does not think that patient needs inpatient admission for observation right now.  He also does not feel strongly about feeding tube.  I discussed again with her GI doctor, Dr. Hilarie Fredrickson and he states that patient does not need a feeding tube right now.  They can schedule that outpatient.  At this point, patient can be discharged home with multivitamin and thiamine.  Dr. Posey Pronto recommend that we hold metformin for now.  She is in the process of getting referral to [facility] eating disorder clinic.  She also can follow-up with GI outpatient  Problems Addressed: Anorexia nervosa: chronic illness or injury  Amount and/or Complexity of Data Reviewed Labs: ordered. Decision-making details documented in ED Course. Radiology: ordered and independent interpretation performed. Decision-making details documented in ED Course.  Risk OTC drugs. Prescription drug management. Decision regarding hospitalization.          Drenda Freeze, MD 03/27/21 [phone]

## 2021-03-27 NOTE — Discharge Instructions (Addendum)
Your labs are currently normal right now.  You will need to see eating disorder clinic as below  Hold metformin for now   Please take multivitamin daily and thiamine daily.  Also follow-up with GI outpatient.  You can get a G-tube scheduled outpatient   Return to ER if you have worse weight loss, trouble eating, abdominal pain.   For your behavioral health needs you are advised to follow up with the [place] [facility]:       [facility]      [phone]      [email or web address]

## 2021-03-27 NOTE — Progress Notes (Signed)
Patient ID: Becky [name], female   DOB: [birth year], 66 y.o.   MRN: [id number] IR team aware of request for gastrostomy tube placement in patient.  CT of the abdomen performed today revealed that patient's anatomy is amenable for G-tube placement.  We will make patient n.p.o. after midnight, discuss procedure with her on 2/15 and evaluate for tube placement as schedule allows.

## 2021-03-27 NOTE — ED Provider Notes (Signed)
Emergency Medicine Observation Re-evaluation Note  Becky [name] is a 66 y.o. female, seen on rounds today.  Pt initially presented to the ED for complaints of Eating Disorder Currently, the patient is resting.  Physical Exam  BP 134/69 (BP Location: Left Arm)    Pulse 80    Temp 99.1 F (37.3 C) (Oral)    Resp 17    Ht 5' (1.524 m)    Wt 41.9 kg    SpO2 100%    BMI 18.03 kg/m  Physical Exam General: no acute distress Cardiac: RRR Lungs: clear Psych: no psychosis  ED Course / MDM  EKG:   I have reviewed the labs performed to date as well as medications administered while in observation.  Recent changes in the last 24 hours include home meds.  Plan  Current plan is for psychiatric consultation. They have recommend med-psych placement. Will consult general surgery for potential PEG. Nutrition consult ordered.  Becky [name] is not under involuntary commitment.     Sherwood Gambler, MD 03/27/21 1039

## 2021-03-27 NOTE — BH Assessment (Addendum)
TTS and Psych consult ordered for patient. Clinician reached out to the EDP (Dr. Regenia Skeeter) for clarification on the needs of this patient.   Clinician received a reply from the [id number] what Dr. Eulis Foster wrote regarding psychiatry consult: "Hospitalist contacted who does not want to admit patient because of normal vital signs, and laboratory testing.  Hospitalist also communicated with gastroenterology Carlean Purl) who is in favor of feeding tube placement.  Current plan is for psychiatry evaluation for capacity, and possible admission for eating disorder causing significant nutritional problems leading to weight loss.  Patient will be observed during this time, for possible decompensation requiring hospitalization."  Clinician discussed further with the [facility] Priscille Loveless, DNP), who determined that patient meets criteria for inpatient psychiatric treatment. Disposition Counselor to seek appropriate placement.   TTS still needs to be completed for this patient. Attempted to complete patient's TTS assessment earlier this morning. However, she was somnolent. After several attempts to wake patient up she eventually would not respond to her name being called. Nursing was notified of this Clinicians failed attempt to complete patient's TTS assessment.  Clinician later reached out to patient's nurse again to inform that patient still needed a TTS consult, requested that patient is alert, in order to complete her TTS assessment. Awaiting response back from patient's nurse.

## 2021-03-27 NOTE — Telephone Encounter (Signed)
Appears pt did proceed to ED. According to notes, tx't will be deferred to [facility].

## 2021-03-27 NOTE — BH Assessment (Signed)
@  0739, requested patients' nurse Marianna Fuss, RN) to place the TTS machine in patient's room.

## 2021-03-27 NOTE — Consult Note (Signed)
°  Patient presents to the ED for evaluation of weight loss reported 50 pound weight loss over several months, caused by her anorexia nervosa.  She is currently receiving outpatient services for psychiatry, therapist, nutritionist, primary care, and gastroenterology.  It is with further recommendation patient presented to the emergency department for placement of PEG tube.  Psych consult was placed for capacity evaluation.   On interview, Becky [name] reports that she is here to receive treatment for anorexia, and had a PEG tube placed.  She is aware of underlying mental conditions that are impacting her weight loss, and was agreeing for tube placement until she is stable.  She denies any suicide attempt by starvation, and does appear to be future oriented and motivated to seek appropriate resources.  She denies any current suicidal ideations, homicidal ideations, and or auditory or visual hallucinations.  She denies any history of suicide attempts.  She denies any history of mood symptoms.  She is able to contract for safety at this time.     # Capacity evaluation  At the present time, the psychiatry consultation service believes the patient does have decisional capacity with respect to having a PEG tube placed.  criteria for decision making capacity requires patient be able to: (1) communicate a choice in a clear and consistent manner, (2) demonstrate adequate understanding of disclosed relevant information regarding her/his medical condition and treatment, possible benefits and risks of that treatment, and alternative approaches, (3) describe views of her/his medical condition, proposed treatment and its consequences, and (4) engage in a rational process of manipulating the relevant information to reach her/his decision. Based on our examination, the patient does meet those criteria. It should be emphasized however, that capacity may need to be re-assessed, as the patients mental status changes over time.     Will recommend starting anti-depressant. mirtazapine 7.5mg  po qhs for depression, anxiety, and to increase her appetite. Patient with limited support system and instability. Patient does have mental capacity to make decisions and has insight into to her mental condition. She does admit that she has seeking help from her outpatient services, in addition to recommendation for PEG tube, which she came to have placed. She minimizes her depressive symptoms. SHe does appear to be hopeful about getting assistance for nutrition and PEG tube placement..       Wt Readings from Last 3 Encounters:  03/26/21 43.3 kg  03/04/21 45 kg  12/01/20 45.2 kg  Last 3 recorded weights are difference of about 6 pounds from October through February, unclear if patient has actually lost 50 pounds or reported 50 pound weight loss.  Her current labs and vital signs are stable.  TTS will continue to follow this patient at this time, With current inpatient recommendations for crisis stabilization. She has been referred out to multiple inpatient facilities at this time, per chart review.
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
## 2021-04-02 ENCOUNTER — Emergency Department (HOSPITAL_COMMUNITY)
Admission: EM | Admit: 2021-04-02 | Discharge: 2021-04-02 | Disposition: A | Discharge status: Home / Self Care | Payer: Medicare Other | Attending: Student | Admitting: Student

## 2021-04-02 ENCOUNTER — Encounter: Payer: Medicare Other | Admitting: Family Medicine

## 2021-04-02 ENCOUNTER — Other Ambulatory Visit: Payer: Self-pay

## 2021-04-02 ENCOUNTER — Ambulatory Visit: Payer: Medicare Other | Admitting: Physician Assistant

## 2021-04-02 ENCOUNTER — Encounter (HOSPITAL_COMMUNITY): Payer: Self-pay | Admitting: Emergency Medicine

## 2021-04-02 ENCOUNTER — Emergency Department (HOSPITAL_COMMUNITY): Payer: Medicare Other

## 2021-04-02 DIAGNOSIS — R0781 Pleurodynia: Secondary | ICD-10-CM | POA: Diagnosis present

## 2021-04-02 DIAGNOSIS — Y92009 Unspecified place in unspecified non-institutional (private) residence as the place of occurrence of the external cause: Secondary | ICD-10-CM | POA: Insufficient documentation

## 2021-04-02 DIAGNOSIS — R519 Headache, unspecified: Secondary | ICD-10-CM | POA: Diagnosis not present

## 2021-04-02 DIAGNOSIS — W01198A Fall on same level from slipping, tripping and stumbling with subsequent striking against other object, initial encounter: Secondary | ICD-10-CM | POA: Diagnosis not present

## 2021-04-02 DIAGNOSIS — S29001A Unspecified injury of muscle and tendon of front wall of thorax, initial encounter: Secondary | ICD-10-CM | POA: Diagnosis present

## 2021-04-02 DIAGNOSIS — S2231XA Fracture of one rib, right side, initial encounter for closed fracture: Secondary | ICD-10-CM | POA: Diagnosis not present

## 2021-04-02 DIAGNOSIS — W19XXXA Unspecified fall, initial encounter: Secondary | ICD-10-CM

## 2021-04-02 MED ORDER — LIDOCAINE 5 % EX PTCH: 1.0000 | MEDICATED_PATCH | CUTANEOUS | 0 refills | Status: DC

## 2021-04-02 MED ORDER — MELOXICAM 7.5 MG PO TABS: 7.5000 mg | ORAL_TABLET | Freq: Every day | ORAL | 0 refills | Status: DC

## 2021-04-02 NOTE — ED Provider Notes (Signed)
[place] EMERGENCY DEPARTMENT Provider Note   CSN: [id number] Arrival date & time: 04/02/21  1817     History  No chief complaint on file.   Becky [name] is a 66 y.o. female.  Patient with history of osteopenia presents to the emergency department after having a fall 2 days ago.  Patient states that she was standing on her dryer at home reaching for something on a shelf.  She fell off of the dryer into a dresser.  She may have hit her head but denies loss of consciousness.  She has a headache that is improving.  No neck pain, vomiting, or confusion.  Her main complaint currently is pain in her right lateral ribs that is worse with movement.  She denies anticoagulation or bruising.  She denies hip pain or lower extremity injury.  No difficulty breathing or shortness of breath.  She has taken ibuprofen at home for her symptoms.      Home Medications Prior to Admission medications   Medication Sig Start Date End Date Taking? Authorizing Provider  acetaminophen (TYLENOL) 500 MG tablet Take 1,000 mg by mouth every 6 (six) hours as needed for mild pain.    [provider]  albuterol (PROAIR HFA) 108 (90 BASE) MCG/ACT inhaler INHALE 2 PUFFS INTO THE LUNGS EVERY 6 HOURS AS NEEDED Patient taking differently: Inhale 2 puffs into the lungs every 6 (six) hours as needed for shortness of breath or wheezing. 03/01/13   [name], MD  ARIPiprazole (ABILIFY) 10 MG tablet Take 10 mg by mouth every morning. 03/15/21   [provider]  benztropine (COGENTIN) 2 MG tablet Take 2 mg by mouth 2 (two) times daily. 09/11/20   [provider]  buPROPion (WELLBUTRIN XL) 150 MG 24 hr tablet Take 450 mg by mouth every morning.    [provider]  Cyanocobalamin (VITAMIN B-12 PO) Take by mouth.    [provider]  DULoxetine (CYMBALTA) 60 MG capsule Take 120 mg by mouth daily. 10/20/14   [provider]  EPIPEN 2-PAK 0.3 MG/0.3ML  SOAJ injection Inject 0.3 mg into the muscle as needed for anaphylaxis. 04/05/14   [provider]  Eszopiclone 3 MG TABS Take 3 mg by mouth at bedtime. Use if not asleep within 30 minutes. 06/05/19   [provider]  feeding supplement (ENSURE ENLIVE / ENSURE PLUS) LIQD Take 237 mLs by mouth 2 (two) times daily with a meal. Patient not taking: Reported on 03/27/2021 10/07/20   Terrilee Croak, MD  ferrous sulfate 325 (65 FE) MG tablet Take 325 mg by mouth daily with breakfast.    [provider]  ibuprofen (ADVIL,MOTRIN) 200 MG tablet Take 400 mg by mouth daily as needed for headache or mild pain.    [provider]  LATUDA 60 MG TABS Take 120 mg by mouth at bedtime. Patient not taking: Reported on 03/27/2021 10/02/20   [provider]  Melatonin 10 MG CAPS Take 10 mg by mouth at bedtime.    [provider]  metFORMIN (GLUCOPHAGE-XR) 500 MG 24 hr tablet Take 500 mg by mouth daily. 04/12/19   [provider]  Multiple Vitamins-Minerals (MULTIVITAMIN) tablet Take 1 tablet by mouth daily. 03/27/21   Drenda Freeze, MD  omeprazole (PRILOSEC) 40 MG capsule Take 40 mg by mouth daily. 04/19/19   [provider]  QUEtiapine (SEROQUEL) 50 MG tablet Take 100 mg by mouth at bedtime. 07/20/19   [provider]  thiamine 100 MG tablet Take 1 tablet (100 mg total) by mouth daily. 03/27/21   Drenda Freeze, MD  traZODone (DESYREL) 150 MG tablet Take 150 mg by mouth at bedtime. 02/04/21   [provider]  VITAMIN D PO Take 1 tablet by mouth daily.    [provider]      Allergies    Methadone, Nitrofurantoin, Oxycodone-acetaminophen, Percocet [oxycodone-acetaminophen], Penicillins, Amoxicillin, Aspirin, Clarithromycin, Codeine, Hydrocodone-acetaminophen, Morphine, Ms contin [morphine sulfate], and Propoxyphene    Review of Systems   Review of Systems  Physical Exam Updated Vital Signs BP 122/75 (BP Location: Right  Arm)    Pulse 62    Temp 98 F (36.7 C) (Oral)    Resp 16    SpO2 100%  Physical Exam Vitals and nursing note reviewed.  Constitutional:      General: She is not in acute distress.    Appearance: She is well-developed.  HENT:     Head: Normocephalic and atraumatic.     Right Ear: External ear normal.     Left Ear: External ear normal.     Nose: Nose normal.  Eyes:     Conjunctiva/sclera: Conjunctivae normal.  Cardiovascular:     Rate and Rhythm: Normal rate and regular rhythm.     Heart sounds: No murmur heard. Pulmonary:     Effort: No respiratory distress.     Breath sounds: No wheezing, rhonchi or rales.  Chest:    Abdominal:     Palpations: Abdomen is soft.     Tenderness: There is no abdominal tenderness. There is no guarding or rebound.  Musculoskeletal:     Cervical back: Normal range of motion and neck supple.     Right lower leg: No edema.     Left lower leg: No edema.  Skin:    General: Skin is warm and dry.     Findings: No rash.  Neurological:     General: No focal deficit present.     Mental Status: She is alert. Mental status is at baseline.     Motor: No weakness.  Psychiatric:        Mood and Affect: Mood normal.    ED Results / Procedures / Treatments   Labs (all labs ordered are listed, but only abnormal results are displayed) Labs Reviewed - No data to display  EKG None  Radiology No results found.  Procedures Procedures    Medications Ordered in ED Medications - No data to display  ED Course/ Medical Decision Making/ A&P    Patient seen and examined. History obtained directly from patient.   Labs/EKG: None ordered  Imaging: Ordered rib and chest x-ray.  Considered head imaging however it is now been 2 days since the incident and patient has not had any worsening headache or other symptoms concerning for closed head injury.  She is not on anticoagulation.  After discussion with patient regarding this, will defer at this  time.  Medications/Fluids: None ordered  Most recent vital signs reviewed and are as follows: BP 122/75 (BP Location: Right Arm)    Pulse 62    Temp 98 F (36.7 C) (Oral)    Resp 16    SpO2 100%   Initial impression: Rib contusion, fall.  8:09 PM Reassessment performed. Patient appears comfortable.   Labs and imaging personally reviewed and interpreted including: Chest/rib x-ray, agree fracture noted, no signs of pneumothorax.  Reviewed additional pertinent lab work and imaging with patient at bedside including: Rib fracture  on x-ray.  Most current vital signs reviewed and are as follows: BP 122/75 (BP Location: Right Arm)    Pulse 62    Temp 98 F (36.7 C) (Oral)    Resp 16    SpO2 100%   Plan: Discharge to home  Home treatment: Prescription written for meloxicam, Lidoderm patches.  Will give incentive spirometer.  Return and follow-up instructions: Encouraged return to ED with worsening uncontrolled pain, shortness of breath, difficulty breathing. Encouraged patient to follow-up with their provider in 7 days. Patient verbalized understanding and agreed with plan.                             Medical Decision Making Amount and/or Complexity of Data Reviewed Radiology: ordered.  Risk Prescription drug management.   Patient with right-sided rib pain after fall 2 days ago.  X-ray shows 1 rib fracture.  Vital signs are reassuring.  She looks comfortable here.  Treatment plan as above.  No concern for significant head or neck injury, given no worsening or other focal symptoms now 2 days after the fall.  No objective signs of head trauma.        Final Clinical Impression(s) / ED Diagnoses Final diagnoses:  Closed fracture of one rib of right side, initial encounter  Fall, initial encounter    Rx / DC Orders ED Discharge Orders     None         Carlisle Cater, Hershal Coria 04/02/21 2010    Kommor, [name], MD 04/03/21 [phone]

## 2021-04-02 NOTE — ED Triage Notes (Signed)
Pt here from home with c/o right side pain after falling 2 days ago hitting her side on a dryer

## 2021-04-02 NOTE — ED Notes (Signed)
Patient verbalizes understanding of d/c instructions. Opportunities for questions and answers were provided. Pt d/c from ED and wheeled to lobby. [employer] called and pt will self pay.

## 2021-04-02 NOTE — Progress Notes (Signed)
This encounter was created in error - please disregard.

## 2021-04-02 NOTE — Discharge Instructions (Signed)
Please read and follow all provided instructions.  Your diagnoses today include:  1. Closed fracture of one rib of right side, initial encounter   2. Fall, initial encounter     Tests performed today include: Chest x-ray and rib x-ray --shows a broken rib on the right side, otherwise lungs look okay Vital signs. See below for your results today.   Medications prescribed:  Meloxicam - anti-inflammatory pain medication  You have been prescribed an anti-inflammatory medication or NSAID. Take with food. Do not take aspirin, ibuprofen, or naproxen if taking this medication. Take smallest effective dose for the shortest duration needed for your pain. Stop taking if you experience stomach pain or vomiting.   Lidoderm patches - topical numbing patches  Take any prescribed medications only as directed.  Home care instructions:  Follow any educational materials contained in this packet.  Use incentive spirometer 10 times an hour while awake to help fully inflate your lungs to prevent pneumonia.  BE VERY CAREFUL not to take multiple medicines containing Tylenol (also called acetaminophen). Doing so can lead to an overdose which can damage your liver and cause liver failure and possibly death.   Follow-up instructions: Please follow-up with your primary care provider in the next 7 days for further evaluation of your symptoms.   Return instructions:  Please return to the Emergency Department if you experience worsening symptoms.  Return with worsening shortness of breath, cough, difficulty breathing. Please return if you have any other emergent concerns.  Additional Information:  Your vital signs today were: BP 122/75 (BP Location: Right Arm)    Pulse 62    Temp 98 F (36.7 C) (Oral)    Resp 16    SpO2 100%  If your blood pressure (BP) was elevated above 135/85 this visit, please have this repeated by your doctor within one month. --------------

## 2021-04-06 ENCOUNTER — Telehealth: Payer: Self-pay | Admitting: Physician Assistant
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
## 2021-04-06 NOTE — Telephone Encounter (Signed)
[facility] called requesting to speak with you regarding the need for a feeding tube placement. Tel: [phone].

## 2021-04-06 NOTE — Telephone Encounter (Signed)
Left message for the patient.  Can leave information for me if needed.  Have talked with Becky [name] in regards to this patient 2-3 times all ready.  Patient has OV Monday.

## 2021-04-09 ENCOUNTER — Encounter (HOSPITAL_COMMUNITY): Payer: Self-pay | Admitting: Emergency Medicine

## 2021-04-09 ENCOUNTER — Ambulatory Visit: Payer: Medicare Other | Admitting: Physician Assistant

## 2021-04-09 ENCOUNTER — Telehealth: Payer: Self-pay | Admitting: Physician Assistant

## 2021-04-09 ENCOUNTER — Other Ambulatory Visit: Payer: Self-pay

## 2021-04-09 ENCOUNTER — Inpatient Hospital Stay (HOSPITAL_COMMUNITY)
Admission: EM | Admit: 2021-04-09 | Discharge: 2021-04-16 | DRG: 883 | Disposition: A | Discharge status: Home Health | Payer: Medicare Other | Attending: Internal Medicine | Admitting: Internal Medicine

## 2021-04-09 DIAGNOSIS — R627 Adult failure to thrive: Secondary | ICD-10-CM | POA: Diagnosis present

## 2021-04-09 DIAGNOSIS — K219 Gastro-esophageal reflux disease without esophagitis: Secondary | ICD-10-CM | POA: Diagnosis present

## 2021-04-09 DIAGNOSIS — K30 Functional dyspepsia: Secondary | ICD-10-CM | POA: Diagnosis present

## 2021-04-09 DIAGNOSIS — D649 Anemia, unspecified: Secondary | ICD-10-CM | POA: Diagnosis present

## 2021-04-09 DIAGNOSIS — W19XXXA Unspecified fall, initial encounter: Secondary | ICD-10-CM | POA: Diagnosis present

## 2021-04-09 DIAGNOSIS — F4310 Post-traumatic stress disorder, unspecified: Secondary | ICD-10-CM | POA: Diagnosis present

## 2021-04-09 DIAGNOSIS — R54 Age-related physical debility: Secondary | ICD-10-CM | POA: Diagnosis present

## 2021-04-09 DIAGNOSIS — Z818 Family history of other mental and behavioral disorders: Secondary | ICD-10-CM

## 2021-04-09 DIAGNOSIS — K3184 Gastroparesis: Secondary | ICD-10-CM | POA: Diagnosis present

## 2021-04-09 DIAGNOSIS — F5000 Anorexia nervosa, unspecified: Principal | ICD-10-CM | POA: Diagnosis present

## 2021-04-09 DIAGNOSIS — F309 Manic episode, unspecified: Secondary | ICD-10-CM | POA: Diagnosis present

## 2021-04-09 DIAGNOSIS — Z811 Family history of alcohol abuse and dependence: Secondary | ICD-10-CM

## 2021-04-09 DIAGNOSIS — Z791 Long term (current) use of non-steroidal anti-inflammatories (NSAID): Secondary | ICD-10-CM

## 2021-04-09 DIAGNOSIS — R634 Abnormal weight loss: Principal | ICD-10-CM

## 2021-04-09 DIAGNOSIS — I7300 Raynaud's syndrome without gangrene: Secondary | ICD-10-CM | POA: Diagnosis present

## 2021-04-09 DIAGNOSIS — Z833 Family history of diabetes mellitus: Secondary | ICD-10-CM

## 2021-04-09 DIAGNOSIS — E1143 Type 2 diabetes mellitus with diabetic autonomic (poly)neuropathy: Secondary | ICD-10-CM | POA: Diagnosis present

## 2021-04-09 DIAGNOSIS — Z8042 Family history of malignant neoplasm of prostate: Secondary | ICD-10-CM

## 2021-04-09 DIAGNOSIS — K5900 Constipation, unspecified: Secondary | ICD-10-CM | POA: Diagnosis present

## 2021-04-09 DIAGNOSIS — Z87891 Personal history of nicotine dependence: Secondary | ICD-10-CM

## 2021-04-09 DIAGNOSIS — M8580 Other specified disorders of bone density and structure, unspecified site: Secondary | ICD-10-CM | POA: Diagnosis present

## 2021-04-09 DIAGNOSIS — S2231XA Fracture of one rib, right side, initial encounter for closed fracture: Secondary | ICD-10-CM | POA: Diagnosis present

## 2021-04-09 DIAGNOSIS — Z886 Allergy status to analgesic agent status: Secondary | ICD-10-CM

## 2021-04-09 DIAGNOSIS — Z602 Problems related to living alone: Secondary | ICD-10-CM | POA: Diagnosis present

## 2021-04-09 DIAGNOSIS — E860 Dehydration: Secondary | ICD-10-CM | POA: Diagnosis present

## 2021-04-09 DIAGNOSIS — Z880 Allergy status to penicillin: Secondary | ICD-10-CM

## 2021-04-09 DIAGNOSIS — G894 Chronic pain syndrome: Secondary | ICD-10-CM | POA: Diagnosis present

## 2021-04-09 DIAGNOSIS — Z8659 Personal history of other mental and behavioral disorders: Secondary | ICD-10-CM

## 2021-04-09 DIAGNOSIS — J45909 Unspecified asthma, uncomplicated: Secondary | ICD-10-CM | POA: Diagnosis present

## 2021-04-09 DIAGNOSIS — E876 Hypokalemia: Secondary | ICD-10-CM | POA: Diagnosis present

## 2021-04-09 DIAGNOSIS — Z79899 Other long term (current) drug therapy: Secondary | ICD-10-CM

## 2021-04-09 DIAGNOSIS — Z7984 Long term (current) use of oral hypoglycemic drugs: Secondary | ICD-10-CM

## 2021-04-09 DIAGNOSIS — G43909 Migraine, unspecified, not intractable, without status migrainosus: Secondary | ICD-10-CM | POA: Diagnosis present

## 2021-04-09 DIAGNOSIS — F411 Generalized anxiety disorder: Secondary | ICD-10-CM | POA: Diagnosis present

## 2021-04-09 DIAGNOSIS — K2950 Unspecified chronic gastritis without bleeding: Secondary | ICD-10-CM | POA: Diagnosis present

## 2021-04-09 DIAGNOSIS — U071 COVID-19: Secondary | ICD-10-CM | POA: Diagnosis present

## 2021-04-09 DIAGNOSIS — Z6372 Alcoholism and drug addiction in family: Secondary | ICD-10-CM

## 2021-04-09 DIAGNOSIS — Z888 Allergy status to other drugs, medicaments and biological substances status: Secondary | ICD-10-CM

## 2021-04-09 DIAGNOSIS — E43 Unspecified severe protein-calorie malnutrition: Secondary | ICD-10-CM | POA: Diagnosis present

## 2021-04-09 DIAGNOSIS — M170 Bilateral primary osteoarthritis of knee: Secondary | ICD-10-CM | POA: Diagnosis present

## 2021-04-09 DIAGNOSIS — K3189 Other diseases of stomach and duodenum: Secondary | ICD-10-CM

## 2021-04-09 DIAGNOSIS — Z825 Family history of asthma and other chronic lower respiratory diseases: Secondary | ICD-10-CM

## 2021-04-09 DIAGNOSIS — Z62810 Personal history of physical and sexual abuse in childhood: Secondary | ICD-10-CM | POA: Diagnosis present

## 2021-04-09 DIAGNOSIS — Z681 Body mass index (BMI) 19 or less, adult: Secondary | ICD-10-CM

## 2021-04-09 DIAGNOSIS — I10 Essential (primary) hypertension: Secondary | ICD-10-CM | POA: Diagnosis present

## 2021-04-09 DIAGNOSIS — Z885 Allergy status to narcotic agent status: Secondary | ICD-10-CM

## 2021-04-09 LAB — CBC
HCT: 35.7 % — ABNORMAL LOW (ref 36.0–46.0)
Hemoglobin: 11.8 g/dL — ABNORMAL LOW (ref 12.0–15.0)
MCH: 31.1 pg (ref 26.0–34.0)
MCHC: 33.1 g/dL (ref 30.0–36.0)
MCV: 93.9 fL (ref 80.0–100.0)
Platelets: 202 10*3/uL (ref 150–400)
RBC: 3.8 MIL/uL — ABNORMAL LOW (ref 3.87–5.11)
RDW: 13 % (ref 11.5–15.5)
WBC: 4.3 10*3/uL (ref 4.0–10.5)
nRBC: 0 % (ref 0.0–0.2)

## 2021-04-09 LAB — RESP PANEL BY RT-PCR (FLU A&B, COVID) ARPGX2
Influenza A by PCR: NEGATIVE
Influenza B by PCR: NEGATIVE
SARS Coronavirus 2 by RT PCR: POSITIVE — AB

## 2021-04-09 LAB — BASIC METABOLIC PANEL
Anion gap: 8 (ref 5–15)
BUN: 10 mg/dL (ref 8–23)
CO2: 29 mmol/L (ref 22–32)
Calcium: 9.3 mg/dL (ref 8.9–10.3)
Chloride: 100 mmol/L (ref 98–111)
Creatinine, Ser: 0.9 mg/dL (ref 0.44–1.00)
GFR, Estimated: >60 mL/min (ref >60)
Glucose, Bld: 103 mg/dL — ABNORMAL HIGH (ref 70–99)
Potassium: 3.2 mmol/L — ABNORMAL LOW (ref 3.5–5.1)
Sodium: 137 mmol/L (ref 135–145)

## 2021-04-09 LAB — MAGNESIUM: Magnesium: 1.8 mg/dL (ref 1.7–2.4)

## 2021-04-09 MED ORDER — ACETAMINOPHEN 650 MG RE SUPP: 650.0000 mg | Freq: Four times a day (QID) | RECTAL | Status: DC | PRN

## 2021-04-09 MED ORDER — LIDOCAINE 5 % EX PTCH
1.0000 | MEDICATED_PATCH | CUTANEOUS | Status: DC
  Administered 2021-04-09: 1 via TRANSDERMAL
  Filled 2021-04-09: qty 1

## 2021-04-09 MED ORDER — ENOXAPARIN SODIUM 30 MG/0.3ML IJ SOSY
30.0000 mg | PREFILLED_SYRINGE | Freq: Every day | INTRAMUSCULAR | Status: DC
  Administered 2021-04-11 – 2021-04-16 (×6): 30 mg via SUBCUTANEOUS
  Filled 2021-04-09 (×6): qty 0.3

## 2021-04-09 MED ORDER — BENZTROPINE MESYLATE 1 MG PO TABS
2.0000 mg | ORAL_TABLET | Freq: Two times a day (BID) | ORAL | Status: DC
  Administered 2021-04-09 – 2021-04-13 (×9): 2 mg via ORAL
  Filled 2021-04-09: qty 2
  Filled 2021-04-09: qty 1
  Filled 2021-04-09 (×3): qty 2
  Filled 2021-04-09: qty 1
  Filled 2021-04-09 (×3): qty 2

## 2021-04-09 MED ORDER — BUPROPION HCL ER (XL) 150 MG PO TB24
450.0000 mg | ORAL_TABLET | Freq: Every morning | ORAL | Status: DC
  Administered 2021-04-10: 450 mg via ORAL
  Filled 2021-04-09: qty 3

## 2021-04-09 MED ORDER — THIAMINE HCL 100 MG PO TABS
100.0000 mg | ORAL_TABLET | Freq: Every day | ORAL | Status: DC
Start: 2021-04-09 — End: 2021-04-09

## 2021-04-09 MED ORDER — ADULT MULTIVITAMIN W/MINERALS CH
1.0000 | ORAL_TABLET | Freq: Every day | ORAL | Status: DC
  Administered 2021-04-09 – 2021-04-16 (×8): 1 via ORAL
  Filled 2021-04-09 (×8): qty 1

## 2021-04-09 MED ORDER — THIAMINE HCL 100 MG/ML IJ SOLN
100.0000 mg | Freq: Every day | INTRAMUSCULAR | Status: DC
  Administered 2021-04-09 – 2021-04-10 (×2): 100 mg via INTRAVENOUS
  Filled 2021-04-09 (×2): qty 2

## 2021-04-09 MED ORDER — VITAMIN B-12 100 MCG PO TABS
100.0000 ug | ORAL_TABLET | Freq: Every day | ORAL | Status: DC
  Administered 2021-04-09 – 2021-04-16 (×8): 100 ug via ORAL
  Filled 2021-04-09 (×8): qty 1

## 2021-04-09 MED ORDER — TRAZODONE HCL 50 MG PO TABS
150.0000 mg | ORAL_TABLET | Freq: Every day | ORAL | Status: DC
  Administered 2021-04-09: 150 mg via ORAL
  Filled 2021-04-09: qty 3

## 2021-04-09 MED ORDER — FERROUS SULFATE 325 (65 FE) MG PO TABS
325.0000 mg | ORAL_TABLET | Freq: Every day | ORAL | Status: DC
  Administered 2021-04-10 – 2021-04-16 (×7): 325 mg via ORAL
  Filled 2021-04-09 (×7): qty 1

## 2021-04-09 MED ORDER — LACTATED RINGERS IV SOLN: INTRAVENOUS | Status: AC

## 2021-04-09 MED ORDER — MELATONIN 5 MG PO TABS
10.0000 mg | ORAL_TABLET | Freq: Every day | ORAL | Status: DC
  Administered 2021-04-09 – 2021-04-15 (×7): 10 mg via ORAL
  Filled 2021-04-09 (×7): qty 2

## 2021-04-09 MED ORDER — VITAMIN D 25 MCG (1000 UNIT) PO TABS
1000.0000 [IU] | ORAL_TABLET | Freq: Every day | ORAL | Status: DC
  Administered 2021-04-09 – 2021-04-16 (×8): 1000 [IU] via ORAL
  Filled 2021-04-09 (×8): qty 1

## 2021-04-09 MED ORDER — ARIPIPRAZOLE 10 MG PO TABS: 15.0000 mg | ORAL_TABLET | Freq: Every morning | ORAL | Status: DC

## 2021-04-09 MED ORDER — QUETIAPINE FUMARATE 100 MG PO TABS
100.0000 mg | ORAL_TABLET | Freq: Every day | ORAL | Status: DC
  Administered 2021-04-09: 100 mg via ORAL
  Filled 2021-04-09 (×2): qty 1

## 2021-04-09 MED ORDER — DULOXETINE HCL 60 MG PO CPEP
120.0000 mg | ORAL_CAPSULE | Freq: Every day | ORAL | Status: DC
Start: 2021-04-09 — End: 2021-04-16
  Administered 2021-04-09 – 2021-04-16 (×8): 120 mg via ORAL
  Filled 2021-04-09 (×8): qty 2

## 2021-04-09 MED ORDER — ALBUTEROL SULFATE (2.5 MG/3ML) 0.083% IN NEBU: 2.5000 mg | INHALATION_SOLUTION | Freq: Four times a day (QID) | RESPIRATORY_TRACT | Status: DC | PRN

## 2021-04-09 MED ORDER — ENSURE ENLIVE PO LIQD
237.0000 mL | Freq: Two times a day (BID) | ORAL | Status: DC
  Administered 2021-04-09 – 2021-04-13 (×8): 237 mL via ORAL
  Filled 2021-04-09 (×3): qty 237

## 2021-04-09 MED ORDER — ACETAMINOPHEN 325 MG PO TABS
650.0000 mg | ORAL_TABLET | Freq: Four times a day (QID) | ORAL | Status: DC | PRN
  Administered 2021-04-09: 650 mg via ORAL
  Filled 2021-04-09: qty 2

## 2021-04-09 MED ORDER — POTASSIUM CHLORIDE CRYS ER 20 MEQ PO TBCR
40.0000 meq | EXTENDED_RELEASE_TABLET | Freq: Once | ORAL | Status: AC
  Administered 2021-04-09: 40 meq via ORAL
  Filled 2021-04-09: qty 2

## 2021-04-09 MED ORDER — ARIPIPRAZOLE 5 MG PO TABS
15.0000 mg | ORAL_TABLET | Freq: Every morning | ORAL | Status: DC
  Administered 2021-04-09 – 2021-04-10 (×2): 15 mg via ORAL
  Filled 2021-04-09: qty 2
  Filled 2021-04-09: qty 1

## 2021-04-09 MED ORDER — ACETAMINOPHEN 500 MG PO TABS
1000.0000 mg | ORAL_TABLET | Freq: Four times a day (QID) | ORAL | Status: DC | PRN
  Administered 2021-04-11 – 2021-04-16 (×4): 1000 mg via ORAL
  Filled 2021-04-09 (×4): qty 2

## 2021-04-09 MED ORDER — KETOROLAC TROMETHAMINE 15 MG/ML IJ SOLN
7.5000 mg | Freq: Once | INTRAMUSCULAR | Status: AC
  Administered 2021-04-09: 7.5 mg via INTRAVENOUS
  Filled 2021-04-09: qty 1

## 2021-04-09 MED ORDER — PANTOPRAZOLE SODIUM 40 MG PO TBEC
40.0000 mg | DELAYED_RELEASE_TABLET | Freq: Every day | ORAL | Status: DC
  Administered 2021-04-09 – 2021-04-16 (×8): 40 mg via ORAL
  Filled 2021-04-09 (×8): qty 1

## 2021-04-09 MED ORDER — ENOXAPARIN SODIUM 40 MG/0.4ML IJ SOSY
40.0000 mg | PREFILLED_SYRINGE | Freq: Every day | INTRAMUSCULAR | Status: DC
  Administered 2021-04-09: 40 mg via SUBCUTANEOUS
  Filled 2021-04-09: qty 0.4
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
## 2021-04-09 NOTE — ED Provider Notes (Signed)
[place] EMERGENCY DEPARTMENT Provider Note   CSN: [id number] Arrival date & time: 04/09/21  6222     History  Chief Complaint  Patient presents with   needs feeding tube    Becky [name] is a 66 y.o. female.  Patient with history of anorexia nervosa, gastroparesis, diabetes, anxiety and depression, PTSD -- presents to the emergency department today requesting feeding tube placement.  Patient reports continual weight loss.  She states that she was told to come here at 8 AM today to have a feeding tube placed.  Patient denies chest pain, abdominal pain, vomiting, diarrhea.  Patient was seen by myself 1 week ago after a fall resulting in a right-sided rib fracture.  She states that this is still sore.  She has a chronic cough that has not worsened, however causes her side to hurt more.  Of note, patient had evaluation in the emergency department 2/13-14/2023.  She was seen by hospitalist service for consideration for admission due to anorexia and eating disorder but did not have reason for medical admit.  She was also seen by psych and was not found to have reason for psychiatric admission for the same reasons.  She was referred to [facility].       Home Medications Prior to Admission medications   Medication Sig Start Date End Date Taking? Authorizing Provider  acetaminophen (TYLENOL) 500 MG tablet Take 1,000 mg by mouth every 6 (six) hours as needed for mild pain.    [provider]  albuterol (PROAIR HFA) 108 (90 BASE) MCG/ACT inhaler INHALE 2 PUFFS INTO THE LUNGS EVERY 6 HOURS AS NEEDED Patient taking differently: Inhale 2 puffs into the lungs every 6 (six) hours as needed for shortness of breath or wheezing. 03/01/13   [name], MD  ARIPiprazole (ABILIFY) 10 MG tablet Take 10 mg by mouth every morning. 03/15/21   [provider]  benztropine (COGENTIN) 2 MG tablet Take 2 mg by mouth 2 (two) times daily. 09/11/20    [provider]  buPROPion (WELLBUTRIN XL) 150 MG 24 hr tablet Take 450 mg by mouth every morning.    [provider]  Cyanocobalamin (VITAMIN B-12 PO) Take by mouth.    [provider]  DULoxetine (CYMBALTA) 60 MG capsule Take 120 mg by mouth daily. 10/20/14   [provider]  EPIPEN 2-PAK 0.3 MG/0.3ML SOAJ injection Inject 0.3 mg into the muscle as needed for anaphylaxis. 04/05/14   [provider]  Eszopiclone 3 MG TABS Take 3 mg by mouth at bedtime. Use if not asleep within 30 minutes. 06/05/19   [provider]  feeding supplement (ENSURE ENLIVE / ENSURE PLUS) LIQD Take 237 mLs by mouth 2 (two) times daily with a meal. Patient not taking: Reported on 03/27/2021 10/07/20   Terrilee Croak, MD  ferrous sulfate 325 (65 FE) MG tablet Take 325 mg by mouth daily with breakfast.    [provider]  ibuprofen (ADVIL,MOTRIN) 200 MG tablet Take 400 mg by mouth daily as needed for headache or mild pain.    [provider]  LATUDA 60 MG TABS Take 120 mg by mouth at bedtime. Patient not taking: Reported on 03/27/2021 10/02/20   [provider]  lidocaine (LIDODERM) 5 % Place 1 patch onto the skin daily. Remove & Discard patch within 12 hours or as directed by MD 04/02/21   Carlisle Cater, PA-C  Melatonin 10 MG CAPS Take 10 mg by mouth at  bedtime.    [provider]  meloxicam (MOBIC) 7.5 MG tablet Take 1 tablet (7.5 mg total) by mouth daily. 04/02/21   Carlisle Cater, PA-C  metFORMIN (GLUCOPHAGE-XR) 500 MG 24 hr tablet Take 500 mg by mouth daily. 04/12/19   [provider]  Multiple Vitamins-Minerals (MULTIVITAMIN) tablet Take 1 tablet by mouth daily. 03/27/21   Drenda Freeze, MD  omeprazole (PRILOSEC) 40 MG capsule Take 40 mg by mouth daily. 04/19/19   [provider]  QUEtiapine (SEROQUEL) 50 MG tablet Take 100 mg by mouth at bedtime. 07/20/19   [provider]  thiamine 100 MG tablet Take 1 tablet  (100 mg total) by mouth daily. 03/27/21   Drenda Freeze, MD  traZODone (DESYREL) 150 MG tablet Take 150 mg by mouth at bedtime. 02/04/21   [provider]  VITAMIN D PO Take 1 tablet by mouth daily.    [provider]      Allergies    Methadone, Nitrofurantoin, Oxycodone-acetaminophen, Percocet [oxycodone-acetaminophen], Penicillins, Amoxicillin, Aspirin, Clarithromycin, Codeine, Hydrocodone-acetaminophen, Morphine, Ms contin [morphine sulfate], and Propoxyphene    Review of Systems   Review of Systems  Physical Exam Updated Vital Signs BP (!) 133/93    Pulse 90    Temp 98.2 F (36.8 C) (Oral)    Resp 18    Ht 5' (1.524 m)    Wt 39.5 kg    SpO2 98%    BMI 16.99 kg/m  Physical Exam Vitals and nursing note reviewed.  Constitutional:      General: She is not in acute distress.    Appearance: She is well-developed and underweight.  HENT:     Head: Normocephalic and atraumatic.     Right Ear: External ear normal.     Left Ear: External ear normal.     Nose: Nose normal.  Eyes:     Conjunctiva/sclera: Conjunctivae normal.  Cardiovascular:     Rate and Rhythm: Normal rate and regular rhythm.     Heart sounds: No murmur heard. Pulmonary:     Effort: No respiratory distress.     Breath sounds: No wheezing, rhonchi or rales.  Chest:     Chest wall: Tenderness (mild, right lateral chest wall) present.  Abdominal:     Palpations: Abdomen is soft.     Tenderness: There is no abdominal tenderness. There is no guarding or rebound.  Musculoskeletal:     Cervical back: Normal range of motion and neck supple.     Right lower leg: No edema.     Left lower leg: No edema.  Skin:    General: Skin is warm and dry.     Findings: No rash.  Neurological:     General: No focal deficit present.     Mental Status: She is alert. Mental status is at baseline.     Motor: No weakness.  Psychiatric:        Mood and Affect: Mood normal.    ED Results / Procedures /  Treatments   Labs (all labs ordered are listed, but only abnormal results are displayed) Labs Reviewed  CBC - Abnormal; Notable for the following components:      Result Value   RBC 3.80 (*)    Hemoglobin 11.8 (*)    HCT 35.7 (*)    All other components within normal limits  BASIC METABOLIC PANEL - Abnormal; Notable for the following components:   Potassium 3.2 (*)    Glucose, Bld 103 (*)  All other components within normal limits  RESP PANEL BY RT-PCR (FLU A&B, COVID) ARPGX2  MAGNESIUM  HEPATIC FUNCTION PANEL    EKG None  Radiology No results found.  Procedures Procedures    Medications Ordered in ED Medications - No data to display  ED Course/ Medical Decision Making/ A&P    Patient seen and examined. History obtained directly from patient and from hospitalist, psych, GI notes in epic.  Also previous ED visit.  I was able to speak with Vicie Mutters, PA-C with [facility] gastroenterology.  She has not seen patient personally yet, but has been in contact with the patient's therapist.  There is no order from GI to present to ED for feeding tube placement.  She would likely benefit from inpatient admission, however if no indications for admission, patient would need to follow-up with IR as outpatient.  Labs/EKG: Ordered CBC, CMP, magnesium.  Imaging: None ordered  Medications/Fluids: None ordered  Most recent vital signs reviewed and are as follows: BP (!) 133/93    Pulse 90    Temp 98.2 F (36.8 C) (Oral)    Resp 18    Ht 5' (1.524 m)    Wt 39.5 kg    SpO2 98%    BMI 16.99 kg/m   Initial impression: Eating disorder, chronic.  8:47 AM I went and spoke with patient regarding conversation I had with GI. She is understandably frustrated due to being told her her nutritionist, psychiatrist that she needs a feeding tube. She seems to have an incorrect understanding that she can come to the ED to automatically have this placed. I attempted to explain, that to have a  feeding tube placed as an inpatient, she would need to have an emergent indication for admission and we will check labs to screen for this today. We discussed that if her lab work looks okay, she would best be served by being evaluated for a feeding tube as an outpatient, including by IR. Review of [facility] PA-C note on 03/27/21 seemed to indicate that IR was amenable, however patient was not admitted at that time and has not apparently followed up.   9:01 AM Received call from Dr. Kathryne Eriksson, patient's PCP. States they were hoping to get patient admitted though ED for severe nutritional deficiency from her eating disorder. Left number for consultant.   9:29 AM Patient discussed with Dr. Doren Custard who will see.   Labs personally reviewed and interpreted including: CBC with minimal anemia, normocytic, white blood cell count 4.3; BMP with potassium of 3.2 and normal kidney function, normal BUN; magnesium normal.  Added liver function tests, pending.  Most current vital signs reviewed and are as follows: BP 129/87    Pulse 80    Temp 98.2 F (36.8 C) (Oral)    Resp 16    Ht 5' (1.524 m)    Wt 39.5 kg    SpO2 99%    BMI 16.99 kg/m   10:43 AM Spoke earlier with patient's RD Jenne Campus.   Consulted with Dr. Laural Golden with [facility].  Current plan is for ED medical consultation, will try to get RD evaluation.  Ultimately, patient may be discharged, but would like to provide as much support and direction in the interim as possible.    12:20 PM [facility] will admit.                            Medical Decision Making Amount and/or Complexity of Data  Reviewed Labs: ordered.  Risk Decision regarding hospitalization.   Patient with excessive weight loss due to history of anorexia, other psychiatric confounders.  This has been an ongoing problem for the patient.        Final Clinical Impression(s) / ED Diagnoses Final diagnoses:  Excessive weight loss  Hypokalemia    Rx / DC Orders ED Discharge Orders      None         Carlisle Cater, PA-C 04/09/21 1222    Godfrey Pick, MD 04/11/21 [phone]

## 2021-04-09 NOTE — ED Triage Notes (Signed)
Pt sent by Dr. Velora Heckler for feeding tube placement. States she has eating d/o with hx of feeding tube in 90's.

## 2021-04-09 NOTE — ED Notes (Signed)
Pt provided water and ensure.

## 2021-04-09 NOTE — ED Notes (Signed)
Attempted to obtain purple man from floor x2. No answer at this time

## 2021-04-09 NOTE — ED Notes (Signed)
Called 5W to obtain a purple man for pt report
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
## 2021-04-09 NOTE — ED Notes (Signed)
Provided pt with some water and another warm blanket.

## 2021-04-09 NOTE — Telephone Encounter (Signed)
Good Morning Becky [name],   Patient called and stated that she needed to cancel appointment with you today at 2:00 due to being in [facility] ER.  Patient stated that she would call back at a later time to reschedule.
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
## 2021-04-10 ENCOUNTER — Observation Stay (HOSPITAL_COMMUNITY): Payer: Medicare Other | Admitting: Registered Nurse

## 2021-04-10 ENCOUNTER — Encounter (HOSPITAL_COMMUNITY)
Admission: EM | Disposition: A | Discharge status: Home Health | Payer: Self-pay | Source: Home / Self Care | Attending: Internal Medicine

## 2021-04-10 ENCOUNTER — Encounter (HOSPITAL_COMMUNITY): Payer: Self-pay | Admitting: Internal Medicine

## 2021-04-10 ENCOUNTER — Observation Stay (HOSPITAL_BASED_OUTPATIENT_CLINIC_OR_DEPARTMENT_OTHER): Payer: Medicare Other | Admitting: Registered Nurse

## 2021-04-10 DIAGNOSIS — I10 Essential (primary) hypertension: Secondary | ICD-10-CM | POA: Diagnosis not present

## 2021-04-10 DIAGNOSIS — F418 Other specified anxiety disorders: Secondary | ICD-10-CM

## 2021-04-10 DIAGNOSIS — K3189 Other diseases of stomach and duodenum: Secondary | ICD-10-CM

## 2021-04-10 DIAGNOSIS — E1140 Type 2 diabetes mellitus with diabetic neuropathy, unspecified: Secondary | ICD-10-CM | POA: Diagnosis not present

## 2021-04-10 DIAGNOSIS — R630 Anorexia: Secondary | ICD-10-CM | POA: Diagnosis not present

## 2021-04-10 DIAGNOSIS — E43 Unspecified severe protein-calorie malnutrition: Secondary | ICD-10-CM | POA: Diagnosis not present

## 2021-04-10 HISTORY — PX: BIOPSY: SHX5522

## 2021-04-10 HISTORY — PX: ESOPHAGOGASTRODUODENOSCOPY (EGD) WITH PROPOFOL: SHX5813

## 2021-04-10 LAB — CBC WITH DIFFERENTIAL/PLATELET
Abs Immature Granulocytes: 0.01 10*3/uL (ref 0.00–0.07)
Basophils Absolute: 0 10*3/uL (ref 0.0–0.1)
Basophils Relative: 1 %
Eosinophils Absolute: 0.1 10*3/uL (ref 0.0–0.5)
Eosinophils Relative: 3 %
HCT: 30.7 % — ABNORMAL LOW (ref 36.0–46.0)
Hemoglobin: 10.4 g/dL — ABNORMAL LOW (ref 12.0–15.0)
Immature Granulocytes: 0 %
Lymphocytes Relative: 42 %
Lymphs Abs: 1.1 10*3/uL (ref 0.7–4.0)
MCH: 31.5 pg (ref 26.0–34.0)
MCHC: 33.9 g/dL (ref 30.0–36.0)
MCV: 93 fL (ref 80.0–100.0)
Monocytes Absolute: 0.3 10*3/uL (ref 0.1–1.0)
Monocytes Relative: 12 %
Neutro Abs: 1.1 10*3/uL — ABNORMAL LOW (ref 1.7–7.7)
Neutrophils Relative %: 42 %
Platelets: 164 10*3/uL (ref 150–400)
RBC: 3.3 MIL/uL — ABNORMAL LOW (ref 3.87–5.11)
RDW: 12.8 % (ref 11.5–15.5)
WBC: 2.6 10*3/uL — ABNORMAL LOW (ref 4.0–10.5)
nRBC: 0 % (ref 0.0–0.2)

## 2021-04-10 LAB — BASIC METABOLIC PANEL
Anion gap: 7 (ref 5–15)
BUN: 9 mg/dL (ref 8–23)
CO2: 24 mmol/L (ref 22–32)
Calcium: 8.6 mg/dL — ABNORMAL LOW (ref 8.9–10.3)
Chloride: 103 mmol/L (ref 98–111)
Creatinine, Ser: 0.67 mg/dL (ref 0.44–1.00)
GFR, Estimated: >60 mL/min (ref >60)
Glucose, Bld: 116 mg/dL — ABNORMAL HIGH (ref 70–99)
Potassium: 4 mmol/L (ref 3.5–5.1)
Sodium: 134 mmol/L — ABNORMAL LOW (ref 135–145)

## 2021-04-10 LAB — PHOSPHORUS
Phosphorus: 2.7 mg/dL (ref 2.5–4.6)
Phosphorus: 3.6 mg/dL (ref 2.5–4.6)
Phosphorus: 4 mg/dL (ref 2.5–4.6)

## 2021-04-10 LAB — COMPREHENSIVE METABOLIC PANEL
ALT: 7 U/L (ref 0–44)
AST: 14 U/L — ABNORMAL LOW (ref 15–41)
Albumin: 2.8 g/dL — ABNORMAL LOW (ref 3.5–5.0)
Alkaline Phosphatase: 49 U/L (ref 38–126)
Anion gap: 6 (ref 5–15)
BUN: 7 mg/dL — ABNORMAL LOW (ref 8–23)
CO2: 28 mmol/L (ref 22–32)
Calcium: 8.9 mg/dL (ref 8.9–10.3)
Chloride: 102 mmol/L (ref 98–111)
Creatinine, Ser: 0.78 mg/dL (ref 0.44–1.00)
GFR, Estimated: >60 mL/min (ref >60)
Glucose, Bld: 104 mg/dL — ABNORMAL HIGH (ref 70–99)
Potassium: 3.9 mmol/L (ref 3.5–5.1)
Sodium: 136 mmol/L (ref 135–145)
Total Bilirubin: 0.1 mg/dL — ABNORMAL LOW (ref 0.3–1.2)
Total Protein: 4.9 g/dL — ABNORMAL LOW (ref 6.5–8.1)

## 2021-04-10 LAB — HEPATIC FUNCTION PANEL
ALT: 8 U/L (ref 0–44)
AST: 14 U/L — ABNORMAL LOW (ref 15–41)
Albumin: 2.7 g/dL — ABNORMAL LOW (ref 3.5–5.0)
Alkaline Phosphatase: 44 U/L (ref 38–126)
Bilirubin, Direct: <0.1 mg/dL (ref 0.0–0.2)
Total Bilirubin: 0.3 mg/dL (ref 0.3–1.2)
Total Protein: 5 g/dL — ABNORMAL LOW (ref 6.5–8.1)

## 2021-04-10 LAB — VITAMIN D 25 HYDROXY (VIT D DEFICIENCY, FRACTURES): Vit D, 25-Hydroxy: 29.79 ng/mL — ABNORMAL LOW (ref 30–100)

## 2021-04-10 LAB — MAGNESIUM
Magnesium: 1.9 mg/dL (ref 1.7–2.4)
Magnesium: 1.9 mg/dL (ref 1.7–2.4)

## 2021-04-10 LAB — FOLATE: Folate: 18.7 ng/mL (ref >5.9)

## 2021-04-10 LAB — VITAMIN B12: Vitamin B-12: 1308 pg/mL — ABNORMAL HIGH (ref 180–914)

## 2021-04-10 LAB — C-REACTIVE PROTEIN: CRP: <0.5 mg/dL (ref <1.0)

## 2021-04-10 SURGERY — ESOPHAGOGASTRODUODENOSCOPY (EGD) WITH PROPOFOL
Anesthesia: Monitor Anesthesia Care

## 2021-04-10 MED ORDER — LIDOCAINE 5 % EX PTCH
1.0000 | MEDICATED_PATCH | CUTANEOUS | Status: DC
  Administered 2021-04-10 – 2021-04-15 (×6): 1 via TRANSDERMAL
  Filled 2021-04-10 (×6): qty 1

## 2021-04-10 MED ORDER — TRAZODONE HCL 50 MG PO TABS
100.0000 mg | ORAL_TABLET | Freq: Every day | ORAL | Status: DC
  Administered 2021-04-10 – 2021-04-12 (×3): 100 mg via ORAL
  Filled 2021-04-10 (×3): qty 2

## 2021-04-10 MED ORDER — LACTATED RINGERS IV SOLN: INTRAVENOUS | Status: DC | PRN

## 2021-04-10 MED ORDER — PROPOFOL 10 MG/ML IV BOLUS
INTRAVENOUS | Status: DC | PRN
  Administered 2021-04-10: 20 mg via INTRAVENOUS

## 2021-04-10 MED ORDER — OLANZAPINE 5 MG PO TABS
2.5000 mg | ORAL_TABLET | Freq: Every day | ORAL | Status: DC
  Administered 2021-04-10 – 2021-04-12 (×3): 2.5 mg via ORAL
  Filled 2021-04-10 (×3): qty 1

## 2021-04-10 MED ORDER — LIDOCAINE 2% (20 MG/ML) 5 ML SYRINGE
INTRAMUSCULAR | Status: DC | PRN
  Administered 2021-04-10: 60 mg via INTRAVENOUS

## 2021-04-10 MED ORDER — ARIPIPRAZOLE 10 MG PO TABS
10.0000 mg | ORAL_TABLET | Freq: Every morning | ORAL | Status: DC
  Administered 2021-04-11: 10 mg via ORAL
  Filled 2021-04-10: qty 1

## 2021-04-10 MED ORDER — PHENYLEPHRINE 40 MCG/ML (10ML) SYRINGE FOR IV PUSH (FOR BLOOD PRESSURE SUPPORT)
PREFILLED_SYRINGE | INTRAVENOUS | Status: DC | PRN
  Administered 2021-04-10: 80 ug via INTRAVENOUS

## 2021-04-10 MED ORDER — SODIUM PHOSPHATES 45 MMOLE/15ML IV SOLN
15.0000 mmol | Freq: Once | INTRAVENOUS | Status: AC
  Administered 2021-04-10: 15 mmol via INTRAVENOUS
  Filled 2021-04-10: qty 5

## 2021-04-10 MED ORDER — BUPROPION HCL ER (XL) 150 MG PO TB24
300.0000 mg | ORAL_TABLET | Freq: Every morning | ORAL | Status: DC
Start: 2021-04-11 — End: 2021-04-13
  Administered 2021-04-11 – 2021-04-13 (×3): 300 mg via ORAL
  Filled 2021-04-10 (×2): qty 2

## 2021-04-10 MED ORDER — PROPOFOL 500 MG/50ML IV EMUL
INTRAVENOUS | Status: DC | PRN
  Administered 2021-04-10: 125 ug/kg/min via INTRAVENOUS

## 2021-04-10 SURGICAL SUPPLY — 15 items

## 2021-04-10 NOTE — Anesthesia Postprocedure Evaluation (Signed)
Anesthesia Post Note  Patient: Becky [name]  Procedure(s) Performed: ESOPHAGOGASTRODUODENOSCOPY (EGD) WITH PROPOFOL BIOPSY     Patient location during evaluation: PACU Anesthesia Type: MAC Level of consciousness: awake and alert and oriented Pain management: pain level controlled Vital Signs Assessment: post-procedure vital signs reviewed and stable Respiratory status: spontaneous breathing, nonlabored ventilation and respiratory function stable Cardiovascular status: stable and blood pressure returned to baseline Postop Assessment: no apparent nausea or vomiting Anesthetic complications: no   No notable events documented.  Last Vitals:  Vitals:   04/10/21 1530 04/10/21 1600  BP: 105/72 108/69  Pulse: 64 63  Resp: 16 14  Temp:  36.6 C  SpO2: 96% 95%    Last Pain:  Vitals:   04/10/21 1600  TempSrc: Oral  PainSc:                  Doralyn Kirkes A.

## 2021-04-10 NOTE — Op Note (Signed)
[facility] Patient Name: Becky [name] Procedure Date : 04/10/2021 MRN: [id number] Attending MD: Gatha Mayer , MD Date of Birth: 1955-04-04 CSN: [id number] Age: 66 Admit Type: Inpatient Procedure:                Upper GI endoscopy Indications:              Epigastric abdominal pain, Weight loss Providers:                Gatha Mayer, MD, Carmie End, RN, Despina Pole, Technician, Tyna Jaksch Technician,                            Dewitt Hoes, CRNA Referring MD:              Medicines:                Propofol per Anesthesia, Monitored Anesthesia Care Complications:            No immediate complications. Estimated Blood Loss:     Estimated blood loss: none. Procedure:                Pre-Anesthesia Assessment:                           - Prior to the procedure, a History and Physical                            was performed, and patient medications and                            allergies were reviewed. The patient's tolerance of                            previous anesthesia was also reviewed. The risks                            and benefits of the procedure and the sedation                            options and risks were discussed with the patient.                            All questions were answered, and informed consent                            was obtained. Prior Anticoagulants: The patient has                            taken no previous anticoagulant or antiplatelet                            agents. ASA Grade Assessment: III - A patient with  severe systemic disease. After reviewing the risks                            and benefits, the patient was deemed in                            satisfactory condition to undergo the procedure.                           After obtaining informed consent, the endoscope was                            passed under direct vision. Throughout the                             procedure, the patient's blood pressure, pulse, and                            oxygen saturations were monitored continuously. The                            [id number]-[id number] ([id number]) Olympus endoscope was introduced                            through the mouth, and advanced to the second part                            of duodenum. The upper GI endoscopy was                            accomplished without difficulty. The patient                            tolerated the procedure well. Scope In: Scope Out: Findings:      Nodular ulcerated mucosa was found in the gastric antrum. Biopsies were       taken with a cold forceps for histology. Verification of patient       identification for the specimen was done. Estimated blood loss was       minimal.      A small amount of food (residue) was found in the gastric body.      Evidence of a Nissen fundoplication was found in the cardia. The wrap       appeared tight.      The examined duodenum was normal.      The exam was otherwise without abnormality.      The cardia and gastric fundus were normal on retroflexion. Impression:               - Nodular ulcetated mucosa in the gastric antrum.                            Biopsied.                           - A small amount of food (residue) in the stomach.                           -  A Nissen fundoplication was found. The wrap                            appears tight.                           - Normal examined duodenum.                           - The examination was otherwise normal. Recommendation:           - Return patient to hospital ward for ongoing care.                           - Await pathology and hold off on possible                            gastrostomy until we know results                           Continue PPI                           Defer nutrition management to primary team                           Note she has had delayed gastric emptying issues x                             many years - ? meds, ? related to fundoplication                            (sometimes have vagal nerve injuries) and I doubt                            that is a major issue in her current situation Procedure Code(s):        --- Professional ---                           [id number], Esophagogastroduodenoscopy, flexible,                            transoral; with biopsy, single or multiple Diagnosis Code(s):        --- Professional ---                           K31.89, Other diseases of stomach and duodenum                           Z98.890, Other specified postprocedural states                           R10.13, Epigastric pain                           R63.4, Abnormal weight loss CPT copyright 2019 American  Medical Association. All rights reserved. The codes documented in this report are preliminary and upon coder review may  be revised to meet current compliance requirements. Gatha Mayer, MD 04/10/2021 3:32:11 PM This report has been signed electronically. Number of Addenda: 0

## 2021-04-10 NOTE — Plan of Care (Signed)
Patient will increase PO intake

## 2021-04-10 NOTE — Progress Notes (Signed)
Initial Nutrition Assessment  DOCUMENTATION CODES:   Underweight, Severe malnutrition in context of chronic illness  INTERVENTION:   Continue Multivitamin w/ minerals daily Recommend placing Cortrak until plan for long-term nutrition support is determined: Start Osmolite 1.2 @ 25 mL/hr and advance by 10 mL q12h until goal of 45 mL/hr is reached (1080 mL/day) 150 mL free water flushes q6h Provides 1296 kcal, 60 gm PRO, and 886 mL of free water (1486 mL total) daily.  Pt is at high risk for refeeding. Monitor magnesium, potassium, and phosphorus BID for at least 3 days, MD to replete as needed, as pt is at risk for refeeding syndrome given ongoing eating disorder. Recommend checking micronutrient labs on pt. Received ok from MD.  NUTRITION DIAGNOSIS:   Severe Malnutrition related to chronic illness (Anorexia Nervosa) as evidenced by severe muscle depletion, severe fat depletion, percent weight loss.  GOAL:   Patient will meet greater than or equal to 90% of their needs  MONITOR:   Diet advancement, Labs, Weight trends  REASON FOR ASSESSMENT:   Consult Assessment of nutrition requirement/status, Enteral/tube feeding initiation and management, Diet education  ASSESSMENT:   66 y.o. female presented to the ED for placement of a feeding tube. PMH includes anorexia nervosa, gastroparesis, DM, PTSD, and HTN. Pt admitted due to failure to thrive and suspecting that malnutrition is contributing to cognitive decline and loss of dentition 2/2 eating disorder.   Pt resting in bed at time of RD visit.   Pt reports that she has not had an appetite within years and that she has been having to force herself to eat. Pt stated that she has not had any nausea, vomiting, or diarrhea recently. Pt reports that she thinks that she has a bit of change in her sense of smell recently, but could not elaborate. Pt reports that she has been drinking 1 Ensure per day, but has to split it up throughout the  day and cannot drink it all at once; typically drinks 1/2 in the morning and 1/2 at night. Pt reports that she is a vegetarian as well.   Discussed placing a Cortrak for short-term nutrition until a plan for long-term access is established. RD informed pt that we could have short-term tube placed as early as tomorrow. Pt reports that she has had one in the past and really would not like one, but would like to think about it. This RD agrees to that and told pt I would return tomorrow and that we could discuss more.   Per EMR, pt has had a 24% weight loss within 6 months, this is clinically significant and severe for time frame.   Briefly discussed pt with outpatient RD, to receive a better history of the pt.   Discussed pt over phone with MD. Ordering micronutrient labs. Possible Cortrak.   Medications reviewed and include: Vitamin D3, Ferrous Sulfate, MVI, Protonix, Thiamine, Vitamin B12, IV sodium phosphate Labs reviewed: BUN <7, Vitamin B12 1308    NUTRITION - FOCUSED PHYSICAL EXAM:  Flowsheet Row Most Recent Value  Orbital Region Moderate depletion  Upper Arm Region Severe depletion  Thoracic and Lumbar Region Severe depletion  Buccal Region Moderate depletion  Temple Region Unable to assess  Clavicle Bone Region Severe depletion  Clavicle and Acromion Bone Region Severe depletion  Scapular Bone Region Severe depletion  Dorsal Hand Moderate depletion  Patellar Region Severe depletion  Anterior Thigh Region Severe depletion  Posterior Calf Region Severe depletion  Edema (RD Assessment) None  Hair  Reviewed  Eyes Reviewed  Mouth Reviewed  Skin Reviewed  Nails Reviewed    Diet Order:   Diet Order             Diet NPO time specified Except for: Sips with Meds  Diet effective midnight                   EDUCATION NEEDS:   Not appropriate for education at this time  Skin:  Skin Assessment: Reviewed RN Assessment  Last BM:  No Documentation  Height:   Ht Readings  from Last 1 Encounters:  04/09/21 5' (1.524 m)    Weight:   Wt Readings from Last 1 Encounters:  04/09/21 39.5 kg    Ideal Body Weight:  45.5 kg  BMI:  Body mass index is 16.99 kg/m.  Estimated Nutritional Needs:   Kcal:  1200-1400  Protein:  60-75 grams  Fluid:  >/= 1.5 L    Hermina Barters RD, LDN Clinical Dietitian See [facility] for contact information.

## 2021-04-10 NOTE — Progress Notes (Signed)
HD#0 SUBJECTIVE:  Patient Summary: Ms. Becky [name] is a 66 year old female with a past medical history of anorexia nervosa, gastroparesis, diabetes, PTSD, anxiety and depression presenting to the ER for feeding tube placement.  Overnight Events: no acute events overnight    Interm History: no complaints. States she is feeling fine. Continues to have abd pain. Discussed with psychiatry at bedside as well.   OBJECTIVE:  Vital Signs: Vitals:   04/10/21 1510 04/10/21 1520 04/10/21 1530 04/10/21 1600  BP: 98/60 106/66 105/72 108/69  Pulse: 61 66 64 63  Resp: 15 15 16 14   Temp: 98 F (36.7 C)   97.9 F (36.6 C)  TempSrc: Temporal   Oral  SpO2: 100% 98% 96% 95%  Weight:      Height:       SpO2: 95 % O2 Flow Rate (L/min): 4 L/min  Filed Weights   04/09/21 0813 04/10/21 1439  Weight: 39.5 kg 39.5 kg    No intake or output data in the 24 hours ending 04/10/21 1808 Net IO Since Admission: No IO data has been entered for this period [04/10/21 1808]  Physical Exam: General: alert, appears stated age, in no acute distress, cachectic HEENT: Normocephalic, atraumatic, EOM intact, conjunctiva normal, dry mucous membranes. Poor dentitino CV: Regular rate and rhythm, no murmurs rubs or gallops Pulm: Clear to auscultation bilaterally, normal work of breathing Abdomen: Soft, nondistended, bowel sounds present, tender right side MSK: No lower extremity edema Skin: Warm and dry, significantly decreased skin turgor Neuro: Alert and oriented x3  Patient Lines/Drains/Airways Status     Active Line/Drains/Airways     Name Placement date Placement time Site Days   Peripheral IV 04/09/21 20 G Right Antecubital 04/09/21  0844  Antecubital  1   Incision (Closed) 12/07/13 Back Other (Comment) 12/07/13  1407  -- 2681            Pertinent Labs: CBC Latest Ref Rng & Units 04/10/2021 04/09/2021 03/26/2021  WBC 4.0 - 10.5 K/uL 2.6(L) 4.3 4.5  Hemoglobin 12.0 - 15.0 g/dL 10.4(L)  11.8(L) 11.7(L)  Hematocrit 36.0 - 46.0 % 30.7(L) 35.7(L) 35.9(L)  Platelets 150 - 400 K/uL 164 202 215    CMP Latest Ref Rng & Units 04/10/2021 04/10/2021 04/09/2021  Glucose 70 - 99 mg/dL 104(H) - 116(H)  BUN 8 - 23 mg/dL 7(L) - 9  Creatinine 0.44 - 1.00 mg/dL 0.78 - 0.67  Sodium 135 - 145 mmol/L 136 - 134(L)  Potassium 3.5 - 5.1 mmol/L 3.9 - 4.0  Chloride 98 - 111 mmol/L 102 - 103  CO2 22 - 32 mmol/L 28 - 24  Calcium 8.9 - 10.3 mg/dL 8.9 - 8.6(L)  Total Protein 6.5 - 8.1 g/dL 4.9(L) 5.0(L) -  Total Bilirubin 0.3 - 1.2 mg/dL 0.1(L) 0.3 -  Alkaline Phos 38 - 126 U/L 49 44 -  AST 15 - 41 U/L 14(L) 14(L) -  ALT 0 - 44 U/L 7 8 -    No results for input(s): GLUCAP in the last 72 hours.   Pertinent Imaging: No results found.  ASSESSMENT/PLAN:  Assessment: Principal Problem:   Severe malnutrition (HCC) Active Problems:   Protein-calorie malnutrition, severe   Mucosal abnormality of stomach   Anorexia nervosa Malnutrition Failure to thrive GI and psychiatry have been helping to assess this patient. Per psych, fruitful conversation with outpatient providers. It was discovered that patient was never meant to have outpatient PEG tube as she has stated, and that this was being considered  as treatment with discharge to ALF or SNF for further management. Patient would likely not be able to care for PEG tube and would be unlikely to use as outpatient.  The end goal of PEG tube placement and SNF dispo was to improve patient's nutritional status with the hope of improving cognitive decline that has been observed on outpatient side since patient began losing weight.  - nodular ulcerated mucosa noted on EGD, but otherwise exam was without abnormality.  - Wellbutrin has been decreased due to appetite suppression.  -Cardiac monitoring -Serial electrolyte monitoring. It will also be important to follow phos and magnesium, monitoring for refeeding syndrome.  -Consulted dietitian, psych and GI,  appreciate recommendations -Continue Ensure -Continue IV thiamine   Hypokalemia Potassium 3.9 in the setting of poor oral intake. -Replete as necessary -Trend electrolytes   Cognitive decline  Patient reports recent difficulty with her memory.  This was confirmed with patient's outpatient RD who also states patient has missed appointments which is not like her and has also gotten lost driving several times.  Will check a vitamin B12.  Reports her mother has dementia but does not think it is Alzheimer's disease.  I suspect this is in the setting of malnutrition. -Follow-up vitamin B12, mag, phos, Vitamins ADEK, zinc, copper, foalte, and CRP. -She will have Bradford testing done.  - hopefully her cognitive function can improve with improvement in nutritional status.    Depression Generalized anxiety disorder Psych has evaluated the patient. Appreciate their input.  - recommended tapering off Abilify, switching Seroquel to Zyprexa,for sleep and appetite stimulation, decreasing wellbutrin d/t appetite suppression, and decreasing Trazodone to 100mg . Can consider tapering off cogentin.   Gastroparesis Nuclear medicine gastric emptying study from 2007 was abnormal.  Per chart review I do not see any more recent studies.  This was thought to be due to multiple psychotropic medications.  She failed to respond to Reglan.  Did have an EGD with biopsy and Botox injection.  Unclear if this aided in her gastric emptying.   Recent right rib fracture Patient presented to the ER on 2/20 after sustaining a fall.  Found to have right fracture on radiograph.  Discharged on meloxicam and lidocaine patches. -Pain control with Tylenol and lidocaine patches   Type 2 diabetes mellitus Metformin recently discontinued due to poor oral intake and reported weight loss.  Signature: Delene Ruffini, MD  Internal Medicine Resident, PGY-1 [facility]  Pager: [phone] 6:08 PM, 04/10/2021    Please contact the on call pager after 5 pm and on weekends at [phone].
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
## 2021-04-10 NOTE — Care Management Obs Status (Signed)
[place] NOTIFICATION   Patient Details  Name: Becky [name] MRN: [id number] Date of Birth: December 25, 1955   Medicare Observation Status Notification Given:  Yes    Carles Collet, RN 04/10/2021, 4:28 PM

## 2021-04-10 NOTE — Transfer of Care (Signed)
Immediate Anesthesia Transfer of Care Note  Patient: Becky [name]  Procedure(s) Performed: ESOPHAGOGASTRODUODENOSCOPY (EGD) WITH PROPOFOL BIOPSY  Patient Location: Endoscopy Unit  Anesthesia Type:Mac  Level of Consciousness: awake, alert  and oriented  Airway & Oxygen Therapy: Patient Spontanous Breathing and Patient connected to nasal cannula oxygen  Post-op Assessment: Report given to RN and Post -op Vital signs reviewed and stable  Post vital signs: Reviewed and stable  Last Vitals:  Vitals Value Taken Time  BP 103/52   Temp    Pulse 62   Resp 16   SpO2 98     Last Pain:  Vitals:   04/10/21 1439  TempSrc: Temporal  PainSc: 8          Complications: No notable events documented.

## 2021-04-10 NOTE — Anesthesia Preprocedure Evaluation (Addendum)
Anesthesia Evaluation  Patient identified by MRN, date of birth, ID band Patient awake    Reviewed: Allergy & Precautions, NPO status , Patient's Chart, lab work & pertinent test results  Airway Mallampati: I  TM Distance: >3 FB Neck ROM: Full    Dental  (+) Partial Lower, Missing, Dental Advisory Given,    Pulmonary asthma , former smoker,  Covid + 04/09/21   Pulmonary exam normal breath sounds clear to auscultation       Cardiovascular hypertension, Normal cardiovascular exam Rhythm:Regular Rate:Normal     Neuro/Psych  Headaches, PSYCHIATRIC DISORDERS Anxiety Depression Right foot neuropathy  Neuromuscular disease    GI/Hepatic Neg liver ROS, GERD  Medicated and Controlled,Malnourishment Nausea   Endo/Other  diabetes, Well Controlled, Type 2  Renal/GU Renal InsufficiencyRenal disease  negative genitourinary   Musculoskeletal  (+) Arthritis , Osteoarthritis,    Abdominal   Peds  Hematology  (+) Blood dyscrasia, anemia ,   Anesthesia Other Findings   Reproductive/Obstetrics                            Anesthesia Physical Anesthesia Plan  ASA: 3  Anesthesia Plan: MAC   Post-op Pain Management: Minimal or no pain anticipated   Induction:   PONV Risk Score and Plan: 2 and Propofol infusion and Treatment may vary due to age or medical condition  Airway Management Planned: Natural Airway and Simple Face Mask  Additional Equipment:   Intra-op Plan:   Post-operative Plan:   Informed Consent: I have reviewed the patients History and Physical, chart, labs and discussed the procedure including the risks, benefits and alternatives for the proposed anesthesia with the patient or authorized representative who has indicated his/her understanding and acceptance.     Dental advisory given  Plan Discussed with: Anesthesiologist and CRNA  Anesthesia Plan Comments:         Anesthesia  Quick Evaluation

## 2021-04-10 NOTE — Hospital Course (Addendum)
States that she is doing fine today.  Discussed goal with feeding tube. She said she is okay with using feeding tube to get to a goal weight. Discussed long term goal because she may not be able to manage tube feeds on her own.  States that [facility] would be happy for her to get a NG tube.  Unable to answer why she would prefer feeding tube vs oral food intake.  Finished a bottle of ensure today.

## 2021-04-10 NOTE — Interval H&P Note (Signed)
History and Physical Interval Note:  04/10/2021 2:55 PM  Becky [name]  has presented today for surgery, with the diagnosis of malnoursihment, nausea.  The various methods of treatment have been discussed with the patient and family. After consideration of risks, benefits and other options for treatment, the patient has consented to  Procedure(s): ESOPHAGOGASTRODUODENOSCOPY (EGD) WITH PROPOFOL (N/A) as a surgical intervention.  The patient's history has been reviewed, patient examined, no change in status, stable for surgery.  I have reviewed the patient's chart and labs.  Questions were answered to the patient's satisfaction.     Silvano Rusk

## 2021-04-10 NOTE — Anesthesia Procedure Notes (Signed)
Procedure Name: MAC Date/Time: 04/10/2021 2:52 PM Performed by: Griffin Dakin, CRNA Pre-anesthesia Checklist: Patient identified, Emergency Drugs available, Suction available, Patient being monitored and Timeout performed Patient Re-evaluated:Patient Re-evaluated prior to induction Oxygen Delivery Method: Nasal cannula Induction Type: IV induction Placement Confirmation: positive ETCO2 and breath sounds checked- equal and bilateral Dental Injury: Teeth and Oropharynx as per pre-operative assessment

## 2021-04-10 NOTE — Progress Notes (Addendum)
°  Transition of Care [facility]) Screening Note   Patient Details  Name: Becky [name] Date of Birth: Jun 21, 1955   Transition of Care [facility]) CM/SW Contact:    Benard Halsted, LCSW Phone Number: 04/10/2021, 12:13 PM    Transition of Care Department [facility]) has reviewed patient. We will continue to monitor patient advancement through interdisciplinary progression rounds. If new patient transition needs arise, please place a TOC consult.

## 2021-04-10 NOTE — TOC Initial Note (Signed)
Transition of Care [facility]) - Initial/Assessment Note    Patient Details  Name: Becky [name] MRN: [id number] Date of Birth: [birth year]  Transition of Care [facility]) CM/SW Contact:    Carles Collet, RN Phone Number: 04/10/2021, 4:33 PM  Clinical Narrative:      Patient admitted with malnutrition, long history of anorexia, states she had a feeding tube for several years in the late '80's early '90's and would be able to manage one again on her own.  She states her plan is to DC to home when medically stable.  TOC will continue to follow for assistance with DME and tube feeding supplies.   Please directly secure chat case management 48 hours prior to DC to ensure DME and feed set up to prevent prolonged length of stay.               Expected Discharge Plan: Home/Self Care Barriers to Discharge: Continued Medical Work up   Patient Goals and CMS Choice Patient states their goals for this hospitalization and ongoing recovery are:: to go home, improve nutrition CMS Medicare.gov Compare Post Acute Care list provided to:: Patient Choice offered to / list presented to : Patient  Expected Discharge Plan and Services Expected Discharge Plan: Home/Self Care   Discharge Planning Services: CM Consult Post Acute Care Choice: Durable Medical Equipment Living arrangements for the past 2 months: Apartment                                      Prior Living Arrangements/Services Living arrangements for the past 2 months: Apartment Lives with:: Self                   Activities of Daily Living      Permission Sought/Granted                  Emotional Assessment              Admission diagnosis:  Hypokalemia [E87.6] Severe malnutrition (HCC) [E43] Excessive weight loss [R63.4] Patient Active Problem List   Diagnosis Date Noted   Protein-calorie malnutrition, severe 04/10/2021   Mucosal abnormality of stomach    Severe malnutrition ([place]) 04/09/2021    Excessive weight loss    Adjustment disorder with mixed anxiety and depressed mood 12/06/2020   Malnutrition of moderate degree 10/06/2020   AKI (acute kidney injury) ([place]) 10/04/2020   Acute encephalopathy 10/04/2020   MCI (mild cognitive impairment) 08/05/2019   Chronic insomnia 08/05/2019   PTSD (post-traumatic stress disorder)    Generalized anxiety disorder    History of trauma    Depression 11/19/2018   Claw toe, right 09/10/2017   Bunion of great toe of right foot 05/21/2016   Knee pain, right 11/19/2014   Right hip pain 11/19/2014   Chronic cough 06/15/2014   HTN (hypertension) 06/15/2014   Lumbar foraminal stenosis 12/07/2013   Osteopenia determined by DEXXA 04/2013   Benign paroxysmal positional vertigo 03/01/2013   Nonsuppurative otitis media, not specified as acute or chronic 03/01/2013   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 AT [address] [place] [address] Phone: [phone] Fax: [phone]     Social Determinants of Health (SDOH) Interventions    Readmission Risk Interventions No flowsheet data found.

## 2021-04-11 ENCOUNTER — Encounter: Payer: Self-pay | Admitting: Diagnostic Neuroimaging

## 2021-04-11 ENCOUNTER — Ambulatory Visit: Payer: Medicare Other | Admitting: Diagnostic Neuroimaging

## 2021-04-11 ENCOUNTER — Encounter (HOSPITAL_COMMUNITY): Payer: Self-pay | Admitting: Internal Medicine

## 2021-04-11 DIAGNOSIS — K3189 Other diseases of stomach and duodenum: Secondary | ICD-10-CM | POA: Diagnosis not present

## 2021-04-11 DIAGNOSIS — E43 Unspecified severe protein-calorie malnutrition: Secondary | ICD-10-CM | POA: Diagnosis not present

## 2021-04-11 DIAGNOSIS — F5000 Anorexia nervosa, unspecified: Secondary | ICD-10-CM

## 2021-04-11 DIAGNOSIS — F418 Other specified anxiety disorders: Secondary | ICD-10-CM | POA: Diagnosis not present

## 2021-04-11 DIAGNOSIS — F32A Depression, unspecified: Secondary | ICD-10-CM

## 2021-04-11 DIAGNOSIS — F411 Generalized anxiety disorder: Secondary | ICD-10-CM | POA: Diagnosis not present

## 2021-04-11 DIAGNOSIS — R4181 Age-related cognitive decline: Secondary | ICD-10-CM | POA: Diagnosis not present

## 2021-04-11 DIAGNOSIS — K3184 Gastroparesis: Secondary | ICD-10-CM

## 2021-04-11 DIAGNOSIS — R634 Abnormal weight loss: Secondary | ICD-10-CM | POA: Diagnosis not present

## 2021-04-11 DIAGNOSIS — E1143 Type 2 diabetes mellitus with diabetic autonomic (poly)neuropathy: Secondary | ICD-10-CM

## 2021-04-11 DIAGNOSIS — R630 Anorexia: Secondary | ICD-10-CM | POA: Diagnosis not present

## 2021-04-11 DIAGNOSIS — S2231XD Fracture of one rib, right side, subsequent encounter for fracture with routine healing: Secondary | ICD-10-CM

## 2021-04-11 LAB — COMPREHENSIVE METABOLIC PANEL
ALT: 9 U/L (ref 0–44)
AST: 12 U/L — ABNORMAL LOW (ref 15–41)
Albumin: 2.9 g/dL — ABNORMAL LOW (ref 3.5–5.0)
Alkaline Phosphatase: 54 U/L (ref 38–126)
Anion gap: 7 (ref 5–15)
BUN: 11 mg/dL (ref 8–23)
CO2: 28 mmol/L (ref 22–32)
Calcium: 9.1 mg/dL (ref 8.9–10.3)
Chloride: 103 mmol/L (ref 98–111)
Creatinine, Ser: 0.81 mg/dL (ref 0.44–1.00)
GFR, Estimated: >60 mL/min (ref >60)
Glucose, Bld: 109 mg/dL — ABNORMAL HIGH (ref 70–99)
Potassium: 4 mmol/L (ref 3.5–5.1)
Sodium: 138 mmol/L (ref 135–145)
Total Bilirubin: 0.5 mg/dL (ref 0.3–1.2)
Total Protein: 5.2 g/dL — ABNORMAL LOW (ref 6.5–8.1)

## 2021-04-11 LAB — CBC
HCT: 33.1 % — ABNORMAL LOW (ref 36.0–46.0)
Hemoglobin: 10.8 g/dL — ABNORMAL LOW (ref 12.0–15.0)
MCH: 30.4 pg (ref 26.0–34.0)
MCHC: 32.6 g/dL (ref 30.0–36.0)
MCV: 93.2 fL (ref 80.0–100.0)
Platelets: 202 10*3/uL (ref 150–400)
RBC: 3.55 MIL/uL — ABNORMAL LOW (ref 3.87–5.11)
RDW: 12.9 % (ref 11.5–15.5)
WBC: 4.7 10*3/uL (ref 4.0–10.5)
nRBC: 0 % (ref 0.0–0.2)

## 2021-04-11 LAB — COPPER, SERUM: Copper: 134 ug/dL (ref 80–158)

## 2021-04-11 LAB — GLUCOSE, CAPILLARY: Glucose-Capillary: 102 mg/dL — ABNORMAL HIGH (ref 70–99)

## 2021-04-11 LAB — PHOSPHORUS: Phosphorus: 4.3 mg/dL (ref 2.5–4.6)

## 2021-04-11 LAB — MAGNESIUM: Magnesium: 1.9 mg/dL (ref 1.7–2.4)

## 2021-04-11 LAB — ZINC: Zinc: 67 ug/dL (ref 44–115)

## 2021-04-11 MED ORDER — GUAIFENESIN-DM 100-10 MG/5ML PO SYRP
5.0000 mL | ORAL_SOLUTION | ORAL | Status: DC | PRN
Start: 2021-04-11 — End: 2021-04-16
  Administered 2021-04-11 – 2021-04-15 (×5): 5 mL via ORAL
  Filled 2021-04-11 (×5): qty 5

## 2021-04-11 MED ORDER — ARIPIPRAZOLE 5 MG PO TABS
5.0000 mg | ORAL_TABLET | Freq: Every morning | ORAL | Status: DC
  Administered 2021-04-12 – 2021-04-13 (×2): 5 mg via ORAL
  Filled 2021-04-11 (×2): qty 1

## 2021-04-11 MED ORDER — THIAMINE HCL 100 MG PO TABS
100.0000 mg | ORAL_TABLET | Freq: Every day | ORAL | Status: DC
Start: 2021-04-11 — End: 2021-04-16
  Administered 2021-04-11 – 2021-04-16 (×6): 100 mg via ORAL
  Filled 2021-04-11 (×6): qty 1

## 2021-04-11 MED ORDER — POLYETHYLENE GLYCOL 3350 17 G PO PACK
17.0000 g | PACK | Freq: Every day | ORAL | Status: DC | PRN
  Administered 2021-04-11: 17 g via ORAL
  Filled 2021-04-11: qty 1

## 2021-04-11 MED ORDER — KETOROLAC TROMETHAMINE 15 MG/ML IJ SOLN
7.5000 mg | Freq: Once | INTRAMUSCULAR | Status: AC
  Administered 2021-04-11: 7.5 mg via INTRAVENOUS
  Filled 2021-04-11: qty 1

## 2021-04-11 NOTE — Progress Notes (Addendum)
? ?   Progress Note ? ? Subjective  ?Chief Complaint: Epigastric abdominal pain and weight loss ? ?Today, patient tells me the nutritionist spoke with her about a core track tube.  She tells me that she has had an NG tube before and these are very uncomfortable and she does not want that.  She would prefer to just go ahead and get a PEG/G-tube as she has done this before and found benefit from it.  Tells me that she did order breakfast/dinner but nothing really looked appetizing.  She ate a few crackers.  Does continue with some epigastric pain. ? ? Objective  ? ?Vital signs in last 24 hours: ?Temp:  [97.8 ?F (36.6 ?C)-98 ?F (36.7 ?C)] 97.8 ?F (36.6 ?C) (03/01 4656) ?Pulse Rate:  [61-77] 77 (03/01 0855) ?Resp:  [13-18] 18 (03/01 0855) ?BP: (98-124)/(58-87) 102/58 (03/01 0855) ?SpO2:  [95 %-100 %] 98 % (03/01 0855) ?Weight:  [39.5 kg] 39.5 kg (02/28 1439) ?  ?General:   [name] in NAD ?Heart:  Regular rate and rhythm; no murmurs ?Lungs: Respirations even and unlabored, lungs CTA bilaterally ?Abdomen:  Soft, mild epigastric ttp and nondistended. Normal bowel sounds. ?Psych:  Cooperative. Normal mood and affect. ? ?Lab Results: ?Recent Labs  ?  04/09/21 ?0851 04/10/21 ?8127 04/11/21 ?0431  ?WBC 4.3 2.6* 4.7  ?HGB 11.8* 10.4* 10.8*  ?HCT 35.7* 30.7* 33.1*  ?PLT 202 164 202  ? ?BMET ?Recent Labs  ?  04/09/21 ?1800 04/10/21 ?5170 04/11/21 ?0431  ?NA 134* 136 138  ?K 4.0 3.9 4.0  ?CL 103 102 103  ?CO2 24 28 28   ?GLUCOSE 116* 104* 109*  ?BUN 9 7* 11  ?CREATININE 0.67 0.78 0.81  ?CALCIUM 8.6* 8.9 9.1  ? ?LFT ?Recent Labs  ?  04/10/21 ?0174 04/11/21 ?0431  ?PROT 4.9*  5.0* 5.2*  ?ALBUMIN 2.8*  2.7* 2.9*  ?AST 14*  14* 12*  ?ALT 7  8 9   ?ALKPHOS 49  44 54  ?BILITOT 0.1*  0.3 0.5  ?BILIDIR <0.1  --   ?IBILI NOT CALCULATED  --   ? ? Assessment / Plan:   ?Assessment: ?1.  Epigastric abdominal pain and weight loss: Status post EGD 2/28 with nodular ulcerated mucosa in the gastric antrum, small amount of  food residue in the stomach, Nissen fundoplication with wrap intact and normal duodenum ? ?Plan: ?1.  Discussed NG tube with the patient.  She does not want this done. ?2.  We are awaiting pathology from recent procedure, pending that patient may be able to go forth with G-tube as requested ?3.  Please await further recommendations from Dr. Carlean Purl once he receives pathology.  ? ?Thank you for your kind consultation. ? ? ? LOS: 0 days  ? ?Becky [name]  04/11/2021, 11:40 AM ? ?  ? ? [facility] Attending  ? ?Pathology pending. I do not think gastric findings are contributing to problems much as the bulk is mental illness. Remains fixated on gastrostomy tube. She did not have clear gastroparesis which includes vomiting but an abnormal gastric emptying study showing delayed gastric emptying of unclear significance, in my opinion. It appears that the bulk of her problems are psychiatric. ? ?I will f/u on pathology when it is in. Continue PPI. As far as gastrostomy tube defer to primary team and psych. ? ?Gatha Mayer, MD, [facility] ?[facility] ?04/11/2021 5:13 PM ? ? ? ? ?

## 2021-04-11 NOTE — Consult Note (Shared)
Pt stating that she has a bad sore throat. States she has not been eating because the food is usually cold when it comes. She would eat more if her food were heated up, but not much more. Denies any physical sensation of hunger. Has been thinking a lot about her ideal weight - short term goal is 46 and long term goal is 104. Wants to gain weight to please her therapist who has spent "over 20 hours" trying to secure more treatment for her. Has difficulty listing any internal factors or motivations to gain weight. Talks about the changes in her life and her family pushing her to lose weight after her father's passing. Feels that the weight loss makes her feel better and states she is "still feeling good" with the weight loss; had been working with a nutritionist for 16 years on weight loss as she was overweight. Felt better initially noting that her weight was dropping after so much time trying to lose weight. Does not like the message from the medical team that she needs to gain weight - has recently come to the conclusion that her diet is not healthy. Attempted to reframe this to a focus on increasing intake - pt states she is afraid she would eat "too much". Patient wants a feeding tube because of prior success with a feeding tube years ago - had a PEG and it made her "feel better".   Pt gave verbal consent for case to be used in poster session.

## 2021-04-11 NOTE — Progress Notes (Addendum)
? ? ?HD#0 ?Subjective:  ?Overnight Events: NAEO ? ? Patient reports drinking one ensure this morning. Her ribs are hurting her sometimes and she sometimes has to sit up because of the pain. Also complained to her nurse that she is constipated. She would like to get better and thinks that a feeding tube may help with that ? ?Objective:  ?Vital signs in last 24 hours: ?Vitals:  ? 04/11/21 0000 04/11/21 0309 04/11/21 0855 04/11/21 1146  ?BP: 106/72 99/64 (!) 102/58 97/64  ?Pulse: 70 70 77 75  ?Resp: 13 16 18 20   ?Temp: 97.9 ?F (36.6 ?C) 97.8 ?F (36.6 ?C) 97.8 ?F (36.6 ?C) 97.6 ?F (36.4 ?C)  ?TempSrc: Oral Oral Oral Oral  ?SpO2: 95% 96% 98% 97%  ?Weight:      ?Height:      ? ?Supplemental O2: Room Air ?SpO2: 97 % ?O2 Flow Rate (L/min): 4 L/min ? ? ?Physical Exam:  ?Constitutional: thin, frail appearing woman, in no acute distress ?HEENT: temporal wasting, conjunctiva non-erythematous ?Cardiovascular: regular rate and rhythm, no m/r/g ?Pulmonary/Chest: normal work of breathing on room air, lungs clear to auscultation bilaterally, tenderness over the right lateral ribcage ?Abdominal: scaphoid, non-distended, normal bowel sounds ?MSK: decreased muscle bulk ?Neurological: alert & oriented x 3, answering questions appropriately, ambulating ?Skin: warm and dry ?Psych: normal affect ? ?Filed Weights  ? 04/09/21 0813 04/10/21 1439  ?Weight: 39.5 kg 39.5 kg  ? ? ? ?Intake/Output Summary (Last 24 hours) at 04/11/2021 1332 ?Last data filed at 04/11/2021 1146 ?Gross per 24 hour  ?Intake 240 ml  ?Output --  ?Net 240 ml  ? ?Net IO Since Admission: 240 mL [04/11/21 1332] ? ?Pertinent Labs: ?CBC Latest Ref Rng & Units 04/11/2021 04/10/2021 04/09/2021  ?WBC 4.0 - 10.5 K/uL 4.7 2.6(L) 4.3  ?Hemoglobin 12.0 - 15.0 g/dL 10.8(L) 10.4(L) 11.8(L)  ?Hematocrit 36.0 - 46.0 % 33.1(L) 30.7(L) 35.7(L)  ?Platelets 150 - 400 K/uL 202 164 202  ? ? ?CMP Latest Ref Rng & Units 04/11/2021 04/10/2021 04/10/2021  ?Glucose 70 - 99 mg/dL 109(H) 104(H) -  ?BUN 8 - 23  mg/dL 11 7(L) -  ?Creatinine 0.44 - 1.00 mg/dL 0.81 0.78 -  ?Sodium 135 - 145 mmol/L 138 136 -  ?Potassium 3.5 - 5.1 mmol/L 4.0 3.9 -  ?Chloride 98 - 111 mmol/L 103 102 -  ?CO2 22 - 32 mmol/L 28 28 -  ?Calcium 8.9 - 10.3 mg/dL 9.1 8.9 -  ?Total Protein 6.5 - 8.1 g/dL 5.2(L) 4.9(L) 5.0(L)  ?Total Bilirubin 0.3 - 1.2 mg/dL 0.5 0.1(L) 0.3  ?Alkaline Phos 38 - 126 U/L 54 49 44  ?AST 15 - 41 U/L 12(L) 14(L) 14(L)  ?ALT 0 - 44 U/L 9 7 8   ? ? ?Imaging: ?No results found. ? ?Assessment/Plan:  ? ?Principal Problem: ?  Severe malnutrition ([place]) ?Active Problems: ?  Protein-calorie malnutrition, severe ?  Mucosal abnormality of stomach ? ? ?Patient Summary: ?Becky [name] is a 66 y.o. with a past medical history of anorexia nervosa, gastroparesis, diabetes, PTSD, anxiety and depression presenting to the ER for malnutrition. ? ? ?#Anorexia nervosa ?#Malnutrition ?After speaking with psych who have discussed the patient with her outpatient providers feeding tube may not be the best option for the patient at this time. It is unlikely that the patient is going to be able to manage a PEG tube by herself and there is also the risk that despite the feeding tube the patient will still choose not to administer tube feeds given  her severe ED.  ?- psychiatry following, appreciate assistance ?- nodular ulcerated mucosa noted on EGD, but otherwise exam was without abnormality.  ?- Wellbutrin has been decreased due to appetite suppression.  ?- Cardiac monitoring ?monitoring electrolytes for refeeding syndrome ?- Continue Ensure ?- Continue IV thiamine ?  ?#Cognitive decline  ?Patient reports recent difficulty with her memory.  This was confirmed with patient's outpatient RD who also states patient has missed appointments which is not like her and has also gotten lost driving several times.  This has likely worsened due to poor nutritional status. This complicates her ability to manage her care at home. ?-Follow-up pending Vitamins  E, K, copper, zinc, ?- B12, mag, CRP, folate, normal ?  ?#Depression ?#Generalized anxiety disorder ?Psych following, appreciate assistance ?-zyprexa 2.5, continue to taper off ?- trazodone 100 ?-cogentin 2 ?- wellbutrin 300, consider decreasing further ?- cymbalta 120 ? ?#Gastroparesis ?Nuclear medicine gastric emptying study from 2007 was abnormal. Did have an EGD with biopsy and Botox injection.  Unclear if this aided in her gastric emptying. ?- QTC normal on EKG on 2/28, can consider medicine for nausea as needed ?  ?#Recent right rib fracture ?Patient presented to the ER on 2/20 after sustaining a fall.  Found to have right fracture on radiograph.  Discharged on meloxicam and lidocaine patches. ?-Pain control with Tylenol and lidocaine patches ?  ?#Type 2 diabetes mellitus ?Metformin recently discontinued due to poor oral intake and reported weight loss. Last A1c 6.0 six months ago ? ?#Hypokalemia; resolved ? ?Prior to Admission Living Arrangement: home ?Anticipated Discharge Location: home ?Barriers to Discharge: adequate PO intake ?Dispo: Anticipated discharge to Home in 1 days pending PO intake.  ? ?Scarlett Presto, MD ?Internal Medicine Resident PGY-1 ?Pager [phone] ?Please contact the on call pager after 5 pm and on weekends at [phone]. ? ?

## 2021-04-11 NOTE — Progress Notes (Signed)
Pt ordered several items for dinner this evening.  The main entree had a few bites eaten from it, the rest of the tray appears untouched.  The pt stated that nothing really seemed appetizing.

## 2021-04-11 NOTE — Progress Notes (Addendum)
Nutrition Follow-up ? ?DOCUMENTATION CODES:  ? ?Underweight, Severe malnutrition in context of chronic illness ? ?INTERVENTION:  ? ?Continue Multivitamin w/ minerals daily ?Ensure Enlive po BID, each supplement provides 350 kcal and 20 grams of protein. ?Recommend placing Cortrak until plan for long-term nutrition support is determined: ?Start Osmolite 1.2 @ 25 mL/hr and advance by 10 mL q12h until goal of 45 mL/hr is reached (1080 mL/day) ?150 mL free water flushes q6h ?Provides 1296 kcal, 60 gm PRO, and 886 mL of free water (1486 mL total) daily. ? ?Pt is at high risk for refeeding. Monitor magnesium, potassium, and phosphorus BID for at least 3 days, MD to replete as needed, as pt is at risk for refeeding syndrome given ongoing eating disorder. ? ?NUTRITION DIAGNOSIS:  ? ?Severe Malnutrition related to chronic illness (Anorexia Nervosa) as evidenced by severe muscle depletion, severe fat depletion, percent weight loss. - Ongoing ? ?GOAL:  ? ?Patient will meet greater than or equal to 90% of their needs - Ongoing ? ?MONITOR:  ? ?PO intake, Supplement acceptance, Weight trends, Labs ? ?REASON FOR ASSESSMENT:  ? ?Consult ?Assessment of nutrition requirement/status, Enteral/tube feeding initiation and management, Diet education ? ?ASSESSMENT:  ? ?66 y.o. female presented to the ED for placement of a feeding tube. PMH includes anorexia nervosa, gastroparesis, DM, PTSD, and HTN. Pt admitted due to failure to thrive and suspecting that malnutrition is contributing to cognitive decline and loss of dentition 2/2 eating disorder.  ? ?Pt resting in bed at time of RD visit.  ? ?Pt reports that she is feeling well, that she did not have any GI symptoms after her EGD yesterday. Reports that she was able to eat some dinner last night. Discussed more preference with pt, pt does not like eggs and will eat some dairy products.  ?Per RN note, pt only took a few bites of her entree. ? ?Discussed Cortrak placement with pt again, pt  still unsure if she would like to proceed with the placement. Pt requesting to speak with outpatient therapist, Juliann Pulse, before making a decision. Pt reports that she was going to try and call her therapist in a little bit. RD attempted to call therapist after leaving the room.  ?Reached out to MD to inform of pt current standings on Cortrak placement.  ? ?Per GI's note, pt is now declining Cortrak placement at this time.  ? ?Micronutrient Lab Results ?- Folate: 18.7 (WNL) ?- Vitamin D: 29.79 (L) ?- Vitamin B12: 1308 (H) ?- Zinc: pending  ?- Copper: pending  ?- Vitamin A: pending  ?- Vitamin E: pending  ?- Vitamin K: pending  ?- Selenium: pending  ? ?Medications reviewed and include: Vitamin D3, Ferrous Sulfate, MVI, Protonix, Thiamine, Vitamin B12 ?Labs reviewed. ? ?Diet Order:   ?Diet Order   ? ?       ?  Diet vegetarian Room service appropriate? Yes; Fluid consistency: Thin  Diet effective now       ?  ? ?  ?  ? ?  ? ? ?EDUCATION NEEDS:  ? ?Not appropriate for education at this time ? ?Skin:  Skin Assessment: Reviewed RN Assessment ? ?Last BM:  2/26 ? ?Height:  ? ?Ht Readings from Last 1 Encounters:  ?04/10/21 5' (1.524 m)  ? ? ?Weight:  ? ?Wt Readings from Last 1 Encounters:  ?04/10/21 39.5 kg  ? ? ?Ideal Body Weight:  45.5 kg ? ?BMI:  Body mass index is 17.01 kg/m?. ? ?Estimated Nutritional Needs:  ? ?Kcal:  1200-1400 ? ?Protein:  60-75 grams ? ?Fluid:  >/= 1.5 L ? ? ? ?Hermina Barters RD, LDN ?Clinical Dietitian ?See AMiON for contact information.  ? ?
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
## 2021-04-12 DIAGNOSIS — E43 Unspecified severe protein-calorie malnutrition: Secondary | ICD-10-CM | POA: Diagnosis not present

## 2021-04-12 DIAGNOSIS — R630 Anorexia: Secondary | ICD-10-CM | POA: Diagnosis not present

## 2021-04-12 DIAGNOSIS — F418 Other specified anxiety disorders: Secondary | ICD-10-CM | POA: Diagnosis not present

## 2021-04-12 LAB — PHOSPHORUS: Phosphorus: 3.6 mg/dL (ref 2.5–4.6)

## 2021-04-12 LAB — MAGNESIUM: Magnesium: 1.9 mg/dL (ref 1.7–2.4)

## 2021-04-12 NOTE — Progress Notes (Signed)
? ?  Pathology from gastric bxs pending. ?I will contact her w/ results when they return. ?No new recommendations from my standpoint. ?Hopefully she can receive effective psych treatment ? ?Signing off. ? ?Gatha Mayer, MD, Marval Regal ?[facility] ?04/12/2021 11:47 AM ? ?

## 2021-04-12 NOTE — Progress Notes (Signed)
Patient enjoyed a strawberry italian ice  ?

## 2021-04-12 NOTE — Progress Notes (Signed)
? ? ?Subjective:  ?Overnight Events: NAEON ? ?Feeling tired today, not much energy. Had a little bit of something between lunch and dinner (does not remember what). States that she had some ensure for breakfast but not sure if she had any solid foods. Has not had lunch yet.  ? ?States she had some nausea before eating, but has improved after eating some. Later, states that nausea did not get better. Then, states that nausea got just a little better after eating. ? ?Does not like food. She is a vegetarian so feels nauseous a lot. Just stops and lets nausea go by. Her favorite thing to eat is a Radiographer, therapeutic. Had half of one last night but it was awful.  ? ?Objective:  ?Vital signs in last 24 hours: ?Vitals:  ? 04/11/21 1526 04/11/21 2108 04/11/21 2346 04/12/21 0333  ?BP: 109/66 124/79 104/70 92/62  ?Pulse: 83 70 74 62  ?Resp: 18 19 18 18   ?Temp: 98.5 ?F (36.9 ?C) 98 ?F (36.7 ?C) 98 ?F (36.7 ?C) 98 ?F (36.7 ?C)  ?TempSrc: Oral Oral Oral Oral  ?SpO2: 98% 97% 97% 93%  ?Weight:      ?Height:      ? ?Supplemental O2: Room Air ?SpO2: 93 % ?O2 Flow Rate (L/min): 4 L/min ? ? ?Physical Exam:  ?Constitutional: thin, frail appearing woman, in no acute distress ?HEENT: temporal wasting, conjunctiva non-erythematous ?Cardiovascular: regular rate and rhythm, no m/r/g ?Pulmonary/Chest: normal work of breathing on room air, lungs clear to auscultation bilaterally, tenderness over the right lateral ribcage, dry cough present ?Abdominal: scaphoid, non-distended, normal bowel sounds ?MSK: decreased muscle bulk, intraphalangeal atrophy noted in bilateral hands. ?Neurological: alert & oriented x 3, answering questions appropriately, ambulating ?Skin: warm and dry ?Psych: normal affect ? ?Filed Weights  ? 04/09/21 0813 04/10/21 1439  ?Weight: 39.5 kg 39.5 kg  ? ? ? ?Intake/Output Summary (Last 24 hours) at 04/12/2021 0647 ?Last data filed at 04/11/2021 1539 ?Gross per 24 hour  ?Intake 480 ml  ?Output --  ?Net 480 ml  ? ? ?Net IO Since  Admission: 480 mL [04/12/21 0647] ? ?Pertinent Labs: ?CBC Latest Ref Rng & Units 04/11/2021 04/10/2021 04/09/2021  ?WBC 4.0 - 10.5 K/uL 4.7 2.6(L) 4.3  ?Hemoglobin 12.0 - 15.0 g/dL 10.8(L) 10.4(L) 11.8(L)  ?Hematocrit 36.0 - 46.0 % 33.1(L) 30.7(L) 35.7(L)  ?Platelets 150 - 400 K/uL 202 164 202  ? ? ?CMP Latest Ref Rng & Units 04/11/2021 04/10/2021 04/10/2021  ?Glucose 70 - 99 mg/dL 109(H) 104(H) -  ?BUN 8 - 23 mg/dL 11 7(L) -  ?Creatinine 0.44 - 1.00 mg/dL 0.81 0.78 -  ?Sodium 135 - 145 mmol/L 138 136 -  ?Potassium 3.5 - 5.1 mmol/L 4.0 3.9 -  ?Chloride 98 - 111 mmol/L 103 102 -  ?CO2 22 - 32 mmol/L 28 28 -  ?Calcium 8.9 - 10.3 mg/dL 9.1 8.9 -  ?Total Protein 6.5 - 8.1 g/dL 5.2(L) 4.9(L) 5.0(L)  ?Total Bilirubin 0.3 - 1.2 mg/dL 0.5 0.1(L) 0.3  ?Alkaline Phos 38 - 126 U/L 54 49 44  ?AST 15 - 41 U/L 12(L) 14(L) 14(L)  ?ALT 0 - 44 U/L 9 7 8   ? ? ?Imaging: ?No results found. ? ?Assessment/Plan:  ? ?Principal Problem: ?  Severe malnutrition ([place]) ?Active Problems: ?  Protein-calorie malnutrition, severe ?  Mucosal abnormality of stomach ? ? ?Patient Summary: ?Becky [name] is a 66 y.o. with a past medical history of anorexia nervosa, gastroparesis, diabetes, PTSD, anxiety and depression presenting to the  ER for malnutrition. ? ? ?#Anorexia nervosa ?#Malnutrition ?After speaking with psych who have discussed the patient with her outpatient providers feeding tube may not be the best option for the patient at this time. It is unlikely that the patient is going to be able to manage a PEG tube by herself and there is also the risk that despite the feeding tube the patient will still choose not to administer tube feeds given her severe ED.  ?- psychiatry following, appreciate assistance ?- Goal setting: patient agreeable to increase PO intake day by day. Goal to drink one full Ensure today. ?- nodular ulcerated mucosa noted on EGD, but otherwise exam was without abnormality. Biopsy pending, per GI. ?- Wellbutrin has been  decreased due to appetite suppression.  ?- Cardiac monitoring ?- monitoring electrolytes for refeeding syndrome ?- Continue Ensure ?- Continue IV thiamine ?  ?#Cognitive decline  ?Patient reports recent difficulty with her memory.  This was confirmed with patient's outpatient RD who also states patient has missed appointments which is not like her and has also gotten lost driving several times.  This has likely worsened due to poor nutritional status. This complicates her ability to manage her care at home. ?-Follow-up pending Vitamins E, K ?- B12, mag, CRP, folate, copper, zinc normal ?  ?#Depression ?#Generalized anxiety disorder ?Psych following, appreciate assistance ?-zyprexa 2.5, continue to taper off ?- trazodone 100 ?-cogentin 2 ?- wellbutrin 300, consider decreasing further ?- cymbalta 120 ? ?#Gastroparesis ?Nuclear medicine gastric emptying study from 2007 was abnormal. Did have an EGD with biopsy and Botox injection.  Unclear if this aided in her gastric emptying. ?- QTC normal on EKG on 2/28, can consider medicine for nausea as needed ?  ?#Recent right rib fracture ?Patient presented to the ER on 2/20 after sustaining a fall.  Found to have right fracture on radiograph.  Discharged on meloxicam and lidocaine patches. ?-Pain control with Tylenol and lidocaine patches ?  ?#Type 2 diabetes mellitus ?Metformin recently discontinued due to poor oral intake and reported weight loss. Last A1c 6.0 six months ago ? ?#Hypokalemia; resolved ? ?Prior to Admission Living Arrangement: home ?Anticipated Discharge Location: home ?Barriers to Discharge: adequate PO intake ?Dispo: Anticipated discharge to Home in 1 days pending PO intake.  ? ?Rosezetta Schlatter, MD ?Internal Medicine/Psych Resident PGY-1 ?Pager [phone] ?Please contact the on call pager after 5 pm and on weekends at [phone]. ? ?

## 2021-04-12 NOTE — Consult Note (Shared)
Pt stating that she thinks things will be different after a tube in, will have more people around her (sister, Iver Nestle); acknowledges that she will get help without a tube but thinks with a tube there will be more constant help. She remembers doing a memory test [facility]) with Dr. Candie Chroman but doesn't remember the results of the test. She is oriented to month, year, location and general situation. She did not talk to her family last night. No recent hallucinations or thoughts that she would be better off dead. She thinks she will eat lunch today. She thinks she will eat about 1/3 of lunch today.    Gives permission to talk to brother Eddie Dibbles: [phone] - lives in [place]. OK to call sister if we can't get in touch with him.
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
## 2021-04-12 NOTE — Plan of Care (Signed)

## 2021-04-13 DIAGNOSIS — F418 Other specified anxiety disorders: Secondary | ICD-10-CM | POA: Diagnosis not present

## 2021-04-13 DIAGNOSIS — F5000 Anorexia nervosa, unspecified: Secondary | ICD-10-CM | POA: Diagnosis not present

## 2021-04-13 DIAGNOSIS — E46 Unspecified protein-calorie malnutrition: Secondary | ICD-10-CM | POA: Diagnosis not present

## 2021-04-13 DIAGNOSIS — F411 Generalized anxiety disorder: Secondary | ICD-10-CM | POA: Diagnosis not present

## 2021-04-13 DIAGNOSIS — E43 Unspecified severe protein-calorie malnutrition: Secondary | ICD-10-CM | POA: Diagnosis not present

## 2021-04-13 DIAGNOSIS — R630 Anorexia: Secondary | ICD-10-CM | POA: Diagnosis not present

## 2021-04-13 DIAGNOSIS — F32A Depression, unspecified: Secondary | ICD-10-CM | POA: Diagnosis not present

## 2021-04-13 MED ORDER — ENSURE ENLIVE PO LIQD
237.0000 mL | Freq: Three times a day (TID) | ORAL | Status: DC
  Administered 2021-04-13 – 2021-04-16 (×9): 237 mL via ORAL

## 2021-04-13 MED ORDER — BENZTROPINE MESYLATE 1 MG PO TABS
1.0000 mg | ORAL_TABLET | Freq: Two times a day (BID) | ORAL | Status: DC
  Administered 2021-04-13 – 2021-04-16 (×6): 1 mg via ORAL
  Filled 2021-04-13 (×6): qty 1

## 2021-04-13 MED ORDER — TRAZODONE HCL 50 MG PO TABS
50.0000 mg | ORAL_TABLET | Freq: Every day | ORAL | Status: DC
Start: 2021-04-13 — End: 2021-04-16
  Administered 2021-04-13 – 2021-04-15 (×3): 50 mg via ORAL
  Filled 2021-04-13 (×3): qty 1

## 2021-04-13 MED ORDER — OLANZAPINE 5 MG PO TABS
5.0000 mg | ORAL_TABLET | Freq: Every day | ORAL | Status: DC
  Administered 2021-04-13 – 2021-04-15 (×3): 5 mg via ORAL
  Filled 2021-04-13 (×3): qty 1

## 2021-04-13 MED ORDER — BUPROPION HCL ER (XL) 150 MG PO TB24
150.0000 mg | ORAL_TABLET | Freq: Every morning | ORAL | Status: DC
Start: 2021-04-14 — End: 2021-04-16
  Administered 2021-04-14 – 2021-04-16 (×3): 150 mg via ORAL
  Filled 2021-04-13 (×3): qty 1

## 2021-04-13 NOTE — Evaluation (Signed)
Physical Therapy Evaluation ?Patient Details ?Name: Becky [name] ?MRN: [id number] ?DOB: Sep 21, 1955 ?Today's Date: 04/13/2021 ? ?History of Present Illness ? Becky [name] is a 66 y.o. female presented to the ER requesting a feeding tube. Recent fall with right rib fx.Cognitive decline. PHMx: anorexia nervosa, HTN, DM, dissociative identity disorder, chronic pain syndrome, BPPV and gastroparesis status post Botox injections as well as generalized anxiety disorder and reflux.  ?Clinical Impression ? Patient presents with generalized weakness, decreased endurance and impaired mobility s/p above. Pt lives alone and reports being independent for ADLs/IADLs and helping care for her mother per report PTA. Today, pt requires supervision for all mobility and requires rest breaks during activity as well. Due to mild weakness, endurance impairments and living alone, recommend SNF to maximize independence and mobility prior to return home. Will follow acutely. ?   ? ?Recommendations for follow up therapy are one component of a multi-disciplinary discharge planning process, led by the attending physician.  Recommendations may be updated based on patient status, additional functional criteria and insurance authorization. ? ?Follow Up Recommendations Skilled nursing-short term rehab (<3 hours/day) ? ?  ?Assistance Recommended at Discharge Intermittent Supervision/Assistance  ?Patient can return home with the following ? Help with stairs or ramp for entrance;Assist for transportation;Direct supervision/assist for financial management;Assistance with cooking/housework;A little help with walking and/or transfers;A little help with bathing/dressing/bathroom ? ?  ?Equipment Recommendations None recommended by PT  ?Recommendations for Other Services ?    ?  ?Functional Status Assessment Patient has had a recent decline in their functional status and demonstrates the ability to make significant improvements in function in a  reasonable and predictable amount of time.  ? ?  ?Precautions / Restrictions Precautions ?Precautions: Fall ?Restrictions ?Weight Bearing Restrictions: No  ? ?  ? ?Mobility ? Bed Mobility ?  ?  ?  ?  ?  ?  ?  ?General bed mobility comments: Up in chair upon PT arrival. ?  ? ?Transfers ?Overall transfer level: Needs assistance ?Equipment used: None ?Transfers: Sit to/from Stand ?Sit to Stand: Supervision ?  ?  ?  ?  ?  ?General transfer comment: Supervision for safety, Stood from chair x1, from bathroom x1, from EOB x1. ?  ? ?Ambulation/Gait ?Ambulation/Gait assistance: Supervision ?Gait Distance (Feet): 15 Feet (+ 200') ?Assistive device: None ?Gait Pattern/deviations: Step-through pattern, Decreased stride length, Drifts right/left ?Gait velocity: decreased ?Gait velocity interpretation: <1.31 ft/sec, indicative of household ambulator ?  ?General Gait Details: Slow, mildly unsteady gait but no overt LOB. Decreased arm swing bilaterally. 2/4 DOE. VSS on RA. ? ?Stairs ?  ?  ?  ?  ?  ? ?Wheelchair Mobility ?  ? ?Modified Rankin (Stroke Patients Only) ?  ? ?  ? ?Balance Overall balance assessment: Needs assistance ?Sitting-balance support: Feet supported, No upper extremity supported ?Sitting balance-Leahy Scale: Good ?  ?  ?Standing balance support: During functional activity ?Standing balance-Leahy Scale: Fair ?Standing balance comment: ABle to stand at sink and brush teeth without difficulty/LOB ?  ?  ?  ?  ?  ?  ?  ?  ?  ?  ?  ?   ? ? ? ?Pertinent Vitals/Pain Pain Assessment ?Pain Assessment: Faces ?Faces Pain Scale: Hurts little more ?Pain Location: right ribs ?Pain Descriptors / Indicators: Sore, Grimacing ?Pain Intervention(s): Monitored during session, Repositioned  ? ? ?Home Living Family/patient expects to be discharged to:: Skilled nursing facility ?Living Arrangements: Parent ?  ?  ?  ?  ?  ?  ?  ?  ?  Additional Comments: Pt has a dog, sister and mother has dog at present. Pt was taking care of her mom who  has dementia (per pt), mother is now at sister's house.  ?  ?Prior Function Prior Level of Function : Independent/Modified Independent ?  ?  ?  ?  ?  ?  ?  ?  ?  ? ? ?Hand Dominance  ? Dominant Hand: Right ? ?  ?Extremity/Trunk Assessment  ? Upper Extremity Assessment ?Upper Extremity Assessment: Defer to OT evaluation ?  ? ?Lower Extremity Assessment ?Lower Extremity Assessment: Overall WFL for tasks assessed ?  ? ?Cervical / Trunk Assessment ?Cervical / Trunk Assessment: Normal  ?Communication  ? Communication: No difficulties  ?Cognition Arousal/Alertness: Awake/alert ?Behavior During Therapy: [facility] for tasks assessed/performed ?Overall Cognitive Status: Within Functional Limits for tasks assessed ?  ?  ?  ?  ?  ?  ?  ?  ?  ?  ?  ?  ?  ?  ?  ?  ?General Comments: for basic mobility tasks; "my sister thinks i cannot live alone and take care of myself and it makes me mad." ?  ?  ? ?  ?General Comments   ? ?  ?Exercises    ? ?Assessment/Plan  ?  ?PT Assessment Patient needs continued PT services  ?PT Problem List Decreased strength;Decreased mobility;Decreased balance;Decreased activity tolerance;Decreased safety awareness ? ?   ?  ?PT Treatment Interventions Therapeutic exercise;Gait training;Stair training;Patient/family education;Therapeutic activities;Functional mobility training;Balance training   ? ?PT Goals (Current goals can be found in the Care Plan section)  ?Acute Rehab PT Goals ?Patient Stated Goal: to get a feeding tube ?PT Goal Formulation: With patient ?Time For Goal Achievement: 04/27/21 ?Potential to Achieve Goals: Fair ? ?  ?Frequency Min 3X/week ?  ? ? ?Co-evaluation   ?  ?  ?  ?  ? ? ?  ?AM-PAC PT "6 Clicks" Mobility  ?Outcome Measure Help needed turning from your back to your side while in a flat bed without using bedrails?: None ?Help needed moving from lying on your back to sitting on the side of a flat bed without using bedrails?: None ?Help needed moving to and from a bed to a chair (including  a wheelchair)?: A Little ?Help needed standing up from a chair using your arms (e.g., wheelchair or bedside chair)?: A Little ?Help needed to walk in hospital room?: A Little ?Help needed climbing 3-5 steps with a railing? : A Little ?6 Click Score: 20 ? ?  ?End of Session   ?Activity Tolerance: Patient tolerated treatment well ?Patient left: in chair;with call bell/phone within reach;with chair alarm set ?Nurse Communication: Mobility status ?PT Visit Diagnosis: Difficulty in walking, not elsewhere classified (R26.2);Muscle weakness (generalized) (M62.81) ?  ? ?Time: 1100-1125 ?PT Time Calculation (min) (ACUTE ONLY): 25 min ? ? ?Charges:   PT Evaluation ?$PT Eval Moderate Complexity: 1 Mod ?PT Treatments ?$Gait Training: 8-22 mins ?  ?   ? ? ?Marisa Severin, PT, DPT ?Acute Rehabilitation Services ?Pager [phone] ?Office [phone] ? ? ? ? ?[place] ?04/13/2021, 1:24 PM ? ?

## 2021-04-13 NOTE — Plan of Care (Signed)

## 2021-04-13 NOTE — Progress Notes (Addendum)
Nutrition Follow-up ? ?DOCUMENTATION CODES:  ? ?Underweight, Severe malnutrition in context of chronic illness ? ?INTERVENTION:  ? ?Continue Multivitamin w/ minerals daily ?Increase Ensure Enlive po TID, each supplement provides 350 kcal and 20 grams of protein. ?Recommend placing Cortrak until plan for long-term nutrition support is determined: ?Start Osmolite 1.2 @ 25 mL/hr and advance by 10 mL q12h until goal of 45 mL/hr is reached (1080 mL/day) ?150 mL free water flushes q6h ?Provides 1296 kcal, 60 gm PRO, and 886 mL of free water (1486 mL total) daily. ? ?Pt is at high risk for refeeding. Monitor magnesium, potassium, and phosphorus BID for at least 3 days, MD to replete as needed, as pt is at risk for refeeding syndrome given ongoing eating disorder. ? ?NUTRITION DIAGNOSIS:  ? ?Severe Malnutrition related to chronic illness (Anorexia Nervosa) as evidenced by severe muscle depletion, severe fat depletion, percent weight loss. - Ongoing ? ?GOAL:  ? ?Patient will meet greater than or equal to 90% of their needs - Ongoing ? ?MONITOR:  ? ?PO intake, Supplement acceptance, Labs, Weight trends ? ?REASON FOR ASSESSMENT:  ? ?Consult ?Assessment of nutrition requirement/status, Enteral/tube feeding initiation and management, Diet education ? ?ASSESSMENT:  ? ?66 y.o. female presented to the ED for placement of a feeding tube. PMH includes anorexia nervosa, gastroparesis, DM, PTSD, and HTN. Pt admitted due to failure to thrive and suspecting that malnutrition is contributing to cognitive decline and loss of dentition 2/2 eating disorder.  ? ?Pt resting in chair at time of RD visit.  ? ?Pt reports that she is eating ok. Reports that she drank 1 Ensure this morning. Pt is complaining of stomach pain and some nausea. Pt knows about the goal to drink 1 Ensure per day and was glad that she had met that; RD encourage setting goals and achieving them.  ?Pt reported that she spoke with outpatient RD over the phone two days prior.   ? ?Per EMR, pt with 50% lunch on 3/01.  ? ?Pt reports that she had a bowel movement yesterday, but never told anyone. Pt reports that she still needs  ? ?This RD spoke with pt's outpatient RD over the phone outside of room. Outpatient RD and this RD still agree that the pt would benefit from a Cortrak placement to provide pt with adequate nutrition.  ? ?Discussed plan for pt, Cortrak placement, and long-term needs with MD. Continue to recommend Cortrak for pt due to ongoing poor oral intake until plan for long-term support is determined.  ? ?Micronutrient Lab Results ?- Folate: 18.7 (WNL) ?- Vitamin D: 29.79 (L) ?- Vitamin B12: 1308 (H) ?- Zinc: 67 (WNL) ?- Copper: 134 (WNL) ?- Vitamin A: pending  ?- Vitamin E: pending  ?- Vitamin K: pending  ?- Selenium: pending  ? ?Medications reviewed and include: Vitamin D3, Ferrous Sulfate, MVI, Protonix, Thiamine, Vitamin B12 ?Labs reviewed. ? ?Diet Order:   ?Diet Order   ? ?       ?  Diet vegetarian Room service appropriate? Yes; Fluid consistency: Thin  Diet effective now       ?  ? ?  ?  ? ?  ? ? ?EDUCATION NEEDS:  ? ?Not appropriate for education at this time ? ?Skin:  Skin Assessment: Reviewed RN Assessment ? ?Last BM:  3/02 - per pt report ? ?Height:  ? ?Ht Readings from Last 1 Encounters:  ?04/10/21 5' (1.524 m)  ? ? ?Weight:  ? ?Wt Readings from Last 1 Encounters:  ?04/10/21  39.5 kg  ? ? ?Ideal Body Weight:  45.5 kg ? ?BMI:  Body mass index is 17.01 kg/m?. ? ?Estimated Nutritional Needs:  ? ?Kcal:  1200-1400 ? ?Protein:  60-75 grams ? ?Fluid:  >/= 1.5 L ? ? ? ?Hermina Barters RD, LDN ?Clinical Dietitian ?See AMiON for contact information.  ? ?

## 2021-04-13 NOTE — Evaluation (Addendum)
Occupational Therapy Evaluation ?Patient Details ?Name: Becky [name] ?MRN: [id number] ?DOB: Mar 31, 1955 ?Today's Date: 04/13/2021 ? ? ?History of Present Illness CEIRRA BELLI is a 66 y.o. female presented to the ER requesting a feeding tube. Recent fall with right rib fx.Cognitive decline. COVID +.PHMx: anorexia nervosa, HTN, DM, dissociative identity disorder, chronic pain syndrome, BPPV and gastroparesis status post Botox injections as well as generalized anxiety disorder and reflux.  ? ?Clinical Impression ?  ?This 66 yo female admitted with above presents to acute OT with PLOF of being totally independent with all basic ADLs and IADLs (taking care of her parents).Currently she is a bit unsteady on her feet and her endurance is not the best due to lack of nutrition. She will continue to benefit from acute OT with short term follow up at SNF before returning home. ?   ? ?Recommendations for follow up therapy are one component of a multi-disciplinary discharge planning process, led by the attending physician.  Recommendations may be updated based on patient status, additional functional criteria and insurance authorization.  ? ?Follow Up Recommendations ? Skilled nursing-short term rehab (<3 hours/day)  ?  ?Assistance Recommended at Discharge Frequent or constant Supervision/Assistance  ?Patient can return home with the following A little help with walking and/or transfers;A little help with bathing/dressing/bathroom ? ?  ?Functional Status Assessment ? Patient has had a recent decline in their functional status and demonstrates the ability to make significant improvements in function in a reasonable and predictable amount of time.  ?Equipment Recommendations ? None recommended by OT  ?  ?   ?Precautions / Restrictions Precautions ?Precautions: Fall ?Restrictions ?Weight Bearing Restrictions: No  ? ?  ? ?Mobility Bed Mobility ?Overal bed mobility: Modified Independent ?  ?  ?  ?  ?  ?  ?General bed  mobility comments: HOB up ?  ? ?Transfers ?Overall transfer level: Needs assistance ?Equipment used: None ?Transfers: Sit to/from Stand ?Sit to Stand: Min guard ?  ?  ?  ?  ?  ?General transfer comment: ambulation around in the room without AD, min guard A (one LOB while she was turning to sit in recliner) ?  ? ?  ?Balance Overall balance assessment: Mild deficits observed, not formally tested ?  ?  ?  ?  ?  ?  ?  ?  ?  ?  ?  ?  ?  ?  ?  ?  ?  ?  ?   ? ?ADL either performed or assessed with clinical judgement  ? ?ADL Overall ADL's : Needs assistance/impaired ?Eating/Feeding: Independent;Sitting ?  ?Grooming: Min guard;Standing ?  ?Upper Body Bathing: Set up;Sitting ?  ?Lower Body Bathing: Min guard;Sit to/from stand ?  ?Upper Body Dressing : Set up;Sitting ?  ?Lower Body Dressing: Min guard;Sit to/from stand ?  ?Toilet Transfer: Min guard;Ambulation;Comfort height toilet;Grab bars ?Toilet Transfer Details (indicate cue type and reason): no AD ?Toileting- Water quality scientist and Hygiene: Min guard;Sit to/from stand ?  ?  ?  ?  ?   ? ? ? ?Vision Baseline Vision/History: 1 Wears glasses ?Ability to See in Adequate Light: 0 Adequate ?Patient Visual Report: No change from baseline ?   ?   ?   ?   ? ?Pertinent Vitals/Pain Pain Assessment ?Pain Assessment: Faces ?Faces Pain Scale: Hurts little more ?Pain Location: right ribs ?Pain Descriptors / Indicators: Sore, Grimacing ?Pain Intervention(s): Limited activity within patient's tolerance, Monitored during session, Repositioned  ? ? ? ?Hand Dominance Right ?  ?  Extremity/Trunk Assessment Upper Extremity Assessment ?Upper Extremity Assessment: Overall WFL for tasks assessed ?  ?  ?  ?  ?  ?Communication Communication ?Communication: No difficulties ?  ?Cognition Arousal/Alertness: Awake/alert ?Behavior During Therapy: [facility] for tasks assessed/performed ?Overall Cognitive Status: Within Functional Limits for tasks assessed ?  ?  ?  ?  ?  ?  ?  ?  ?  ?  ?  ?  ?  ?  ?  ?  ?  ?   ?  ?   ?   ?   ? ? ?Home Living Family/patient expects to be discharged to:: Skilled nursing facility ?Living Arrangements: Parent ?  ?  ?  ?  ?  ?  ?  ?  ?  ?  ?  ?  ?  ?  ?  ?Additional Comments: Pt has a dog, sister and mother have dog at present. Pt was taking care of her mom who has dementia (per pt), mother is now at sister's house. ?  ? ?  ?Prior Functioning/Environment Prior Level of Function : Independent/Modified Independent ?  ?  ?  ?  ?  ?  ?  ?  ?  ? ?  ?  ?OT Problem List: Impaired balance (sitting and/or standing);Pain ?  ?   ?OT Treatment/Interventions: Self-care/ADL training;DME and/or AE instruction;Patient/family education;Balance training  ?  ?OT Goals(Current goals can be found in the care plan section) Acute Rehab OT Goals ?Patient Stated Goal: to decide about feeding tube and get help with eating disorder ?OT Goal Formulation: With patient ?Time For Goal Achievement: 04/27/21 ?Potential to Achieve Goals: Good  ?OT Frequency: Min 2X/week ?  ? ?   ?AM-PAC OT "6 Clicks" Daily Activity     ?Outcome Measure Help from another person eating meals?: None ?Help from another person taking care of personal grooming?: A Little ?Help from another person toileting, which includes using toliet, bedpan, or urinal?: A Little ?Help from another person bathing (including washing, rinsing, drying)?: A Little ?Help from another person to put on and taking off regular upper body clothing?: A Little ?Help from another person to put on and taking off regular lower body clothing?: A Little ?6 Click Score: 19 ?  ?End of Session   ? ?Activity Tolerance: Patient tolerated treatment well ?Patient left: in chair;with call bell/phone within reach;with chair alarm set ? ?OT Visit Diagnosis: Unsteadiness on feet (R26.81);Muscle weakness (generalized) (M62.81);Pain ?Pain - Right/Left: Right ?Pain - part of body:  (ribs)  ?              ?Time: 1015-1040 ?OT Time Calculation (min): 25 min ?Charges:  OT General Charges ?$OT  Visit: 1 Visit ?OT Evaluation ?$OT Eval Moderate Complexity: 1 Mod ?OT Treatments ?$Self Care/Home Management : 8-22 mins ? ?[place], OTR/L ?Acute Rehab Services ?Pager [phone] ?Office [phone] ? ? ? ?Al[place] Register ?04/13/2021, 12:52 PM ?

## 2021-04-13 NOTE — Progress Notes (Addendum)
CSW following for SNF placement once feeding decision is made (If peg placed patient may get insurance approval for a week or two. If not, will likely be denied and patient will need to follow up outpatient to get into an ALF. ALF will not accept patient with a PEG). Of note, patient will need to be 10 days past COVID + test.  ? ?CSW contacted [facility]. Admissions stated that they are not in network with Medicare or [place] Medicaid. [facility] is not in network with [facility].  ? ?Cedric Fishman ?LCSW, MSW, MHA ?  ?

## 2021-04-13 NOTE — Progress Notes (Signed)
? ? ?Subjective:  ?Overnight Events: NAEON ? ?Feeling a little better today, reports that she met her goal of drinking one Ensure yesterday. Patient says that she wants to "feel better."  When asked to clarify, she said that she no longer wants to feel dizzy and unsteady on her feet, but she is afraid to eat or to pursue NG/G-tube because she does not want to gain weight.  She says that she came to the ED requesting a feeding tube because it was the next logical step per the recommendation of her GI clinic, not because she necessarily wanted to increase her intake.  It was explained that a G-tube is not medically indicated at this time, and she voiced understanding.  She is agreeable to attempt to eat more today and to continue to drink at least 1 full Ensure daily. ? ?Spoke to therapist, [employer] [phone]): Given an update on patient's care. She advised to consider IOP or PHP, ensure that all resources are exhausted. She was in agreement to either ID surrogate decision maker or consider guardianship if one is unable to be identified. She was appreciative of the call.  ? ?Objective:  ?Vital signs in last 24 hours: ?Vitals:  ? 04/12/21 1622 04/12/21 2000 04/13/21 0005 04/13/21 0416  ?BP: 99/71 116/85 91/67 91/62   ?Pulse: 89 76 82 74  ?Resp: 18 18 20 20   ?Temp: 97.8 ?F (36.6 ?C) 98.2 ?F (36.8 ?C) 97.8 ?F (36.6 ?C) 97.8 ?F (36.6 ?C)  ?TempSrc: Oral Oral Oral Oral  ?SpO2: 95% 94% 93% 97%  ?Weight:      ?Height:      ? ?Supplemental O2: Room Air ?SpO2: 97 % ?O2 Flow Rate (L/min): 4 L/min ? ? ?Physical Exam:  ?Constitutional: thin, frail appearing woman, in no acute distress ?HEENT: temporal wasting, conjunctiva non-erythematous ?Cardiovascular: regular rate and rhythm, no m/r/g ?Pulmonary/Chest: normal work of breathing on room air, lungs clear to auscultation bilaterally, tenderness over the right lateral ribcage, worse and wet cough present but not productive ?Abdominal: scaphoid, non-distended, normal  bowel sounds ?MSK: decreased muscle bulk, intraphalangeal atrophy noted in bilateral hands. ?Neurological: alert & oriented x 3, answering questions appropriately, recent memory with deficits ?Skin: warm and dry ?Psych: normal affect ? ?Filed Weights  ? 04/09/21 0813 04/10/21 1439  ?Weight: 39.5 kg 39.5 kg  ? ? ? ?Intake/Output Summary (Last 24 hours) at 04/13/2021 0737 ?Last data filed at 04/12/2021 2300 ?Gross per 24 hour  ?Intake 120 ml  ?Output --  ?Net 120 ml  ? ? ?Net IO Since Admission: 600 mL [04/13/21 0737] ? ?Pertinent Labs: ?CBC Latest Ref Rng & Units 04/11/2021 04/10/2021 04/09/2021  ?WBC 4.0 - 10.5 K/uL 4.7 2.6(L) 4.3  ?Hemoglobin 12.0 - 15.0 g/dL 10.8(L) 10.4(L) 11.8(L)  ?Hematocrit 36.0 - 46.0 % 33.1(L) 30.7(L) 35.7(L)  ?Platelets 150 - 400 K/uL 202 164 202  ? ? ?CMP Latest Ref Rng & Units 04/11/2021 04/10/2021 04/10/2021  ?Glucose 70 - 99 mg/dL 109(H) 104(H) -  ?BUN 8 - 23 mg/dL 11 7(L) -  ?Creatinine 0.44 - 1.00 mg/dL 0.81 0.78 -  ?Sodium 135 - 145 mmol/L 138 136 -  ?Potassium 3.5 - 5.1 mmol/L 4.0 3.9 -  ?Chloride 98 - 111 mmol/L 103 102 -  ?CO2 22 - 32 mmol/L 28 28 -  ?Calcium 8.9 - 10.3 mg/dL 9.1 8.9 -  ?Total Protein 6.5 - 8.1 g/dL 5.2(L) 4.9(L) 5.0(L)  ?Total Bilirubin 0.3 - 1.2 mg/dL 0.5 0.1(L) 0.3  ?Alkaline Phos 38 - 126  U/L 54 49 44  ?AST 15 - 41 U/L 12(L) 14(L) 14(L)  ?ALT 0 - 44 U/L 9 7 8   ? ? ?Imaging: ?No results found. ? ?Assessment/Plan:  ? ?Principal Problem: ?  Severe malnutrition ([place]) ?Active Problems: ?  Protein-calorie malnutrition, severe ?  Mucosal abnormality of stomach ? ? ?Patient Summary: ?Becky [name] is a 66 y.o. with a past medical history of anorexia nervosa, gastroparesis, diabetes, PTSD, anxiety and depression presenting to the ER for malnutrition. ? ? ?#Anorexia nervosa ?#Malnutrition ?After speaking with psych who have discussed the patient with her outpatient providers feeding tube may not be the best option for the patient at this time. It is unlikely that the  patient is going to be able to manage a PEG tube by herself, nor is it medically indicated at this time.  ?- psychiatry following, appreciate assistance ?- Goal setting: patient agreeable to increase PO intake day by day. Goal to drink one full Ensure daily. ?- nodular ulcerated mucosa noted on EGD, but otherwise exam was without abnormality. Biopsy pending, per GI. ?- Wellbutrin has been decreased due to appetite suppression. Zyprexa increased to stimulate appetite. ?- Cardiac monitoring ?- monitoring electrolytes for refeeding syndrome ?- Continue Ensure ?- Continue IV thiamine ?  ?#Cognitive decline  ?Patient reports recent difficulty with her memory.  This was confirmed with patient's outpatient RD who also states patient has missed appointments which is not like her and has also gotten lost driving several times.  This has likely worsened due to poor nutritional status. This complicates her ability to manage her care at home. ?-Follow-up pending Vitamins E, K ?- B12, mag, CRP, folate, copper, zinc normal ?  ?#Depression ?#Generalized anxiety disorder ?Psych following, appreciate assistance ?-zyprexa 5, continue to increase ?- trazodone 100 ?-cogentin 2, tapering off.  ?- wellbutrin 300, decreasing further ?- cymbalta 120 ? ?#Gastroparesis ?Nuclear medicine gastric emptying study from 2007 was abnormal. Did have an EGD with biopsy and Botox injection.  Unclear if this aided in her gastric emptying. ?- QTC normal on EKG on 2/28, can consider medicine for nausea as needed ?  ?#Recent right rib fracture ?Patient presented to the ER on 2/20 after sustaining a fall.  Found to have right fracture on radiograph.  Discharged on meloxicam and lidocaine patches. ?-Pain control with Tylenol and lidocaine patches ?  ?#Type 2 diabetes mellitus ?Metformin recently discontinued due to poor oral intake and reported weight loss. Last A1c 6.0 six months ago ? ?#Hypokalemia; resolved ? ?Prior to Admission Living Arrangement:  home ?Anticipated Discharge Location: home ?Barriers to Discharge: adequate PO intake; disposition planning ?Dispo: Anticipated discharge to Home pending disposition ? ?Rosezetta Schlatter, MD ?Internal Medicine/Psych Resident PGY-1 ?Pager [phone] ?Please contact the on call pager after 5 pm and on weekends at [phone]. ? ?

## 2021-04-13 NOTE — Progress Notes (Signed)
Patient had half a chocolate ensure, and also asked for a stool softener.  No stool softener ordered at this time.  ?

## 2021-04-13 NOTE — Discharge Summary (Cosign Needed)
Name: Becky [name] MRN: [id number] DOB: 1955-12-13 66 y.o. PCP: [name], MD  Date of Admission: 04/09/2021  8:07 AM Date of Discharge: 04/16/2021 12:04 PM Attending Physician: Angelica Pou, MD  Discharge Diagnosis: 1. Severe protein-calorie malnutrition 2/2 anorexia nervosa 2. Chronic active gastritis  Discharge Medications: Allergies as of 04/16/2021       Reactions   Methadone Shortness Of Breath, Other (See Comments)   Chest Pain   Nitrofurantoin Shortness Of Breath, Other (See Comments)   Chest Pain   Oxycodone-acetaminophen Shortness Of Breath, Other (See Comments)   Chest pain    Percocet [oxycodone-acetaminophen] Shortness Of Breath, Other (See Comments)   Chest pain   Penicillins Nausea And Vomiting, Rash, Other (See Comments), Palpitations   Fever, including amoxicillin  Has patient had a PCN reaction causing immediate rash, facial/tongue/throat swelling, SOB or lightheadedness with hypotension: Yes Has patient had a PCN reaction causing severe rash involving mucus membranes or skin necrosis: No Has patient had a PCN reaction that required hospitalization No Has patient had a PCN reaction occurring within the last 10 years: No If all of the above answers are "NO", then may proceed with Cephalosporin use. Fever, including amoxicillin  Has patient had a PCN reaction causing immediate rash, facial/tongue/throat swelling, SOB or lightheadedness with hypotension: Yes Has patient had a PCN reaction causing severe rash involving mucus membranes or skin necrosis: No Has patient had a PCN reaction that required hospitalization No Has patient had a PCN reaction occurring within the last 10 years: No If all of the above answers are "NO", then may proceed with Cephalosporin use. Fever, including amoxicillin    Amoxicillin Rash   Aspirin Other (See Comments)   stomach pain stomach pain stomach pain   Clarithromycin Rash, Other (See Comments)   Fever    Codeine Nausea And Vomiting, Other (See Comments)   Hydrocodone-acetaminophen Itching   Premeditate benadryl   Morphine Nausea And Vomiting, Other (See Comments)   Ms Contin [morphine Sulfate] Nausea And Vomiting   Morphine IR and ER   Propoxyphene Nausea Only   Sick to stomach        Medication List     STOP taking these medications    ARIPiprazole 15 MG tablet Commonly known as: ABILIFY   Eszopiclone 3 MG Tabs   ibuprofen 200 MG tablet Commonly known as: ADVIL   meloxicam 7.5 MG tablet Commonly known as: Mobic   metFORMIN 500 MG 24 hr tablet Commonly known as: GLUCOPHAGE-XR   QUEtiapine 50 MG tablet Commonly known as: SEROQUEL       TAKE these medications    acetaminophen 500 MG tablet Commonly known as: TYLENOL Take 1,000 mg by mouth every 6 (six) hours as needed for mild pain.   albuterol 108 (90 Base) MCG/ACT inhaler Commonly known as: ProAir HFA INHALE 2 PUFFS INTO THE LUNGS EVERY 6 HOURS AS NEEDED What changed:  how much to take how to take this when to take this reasons to take this additional instructions   benztropine 1 MG tablet Commonly known as: COGENTIN Take 1 tablet (1 mg total) by mouth 2 (two) times daily. What changed:  medication strength how much to take   buPROPion 150 MG 24 hr tablet Commonly known as: WELLBUTRIN XL Take 1 tablet (150 mg total) by mouth every morning. What changed: how much to take   DULoxetine 60 MG capsule Commonly known as: CYMBALTA Take 2 capsules (120 mg total) by mouth daily. Start taking on:  April 17, 2021   EpiPen 2-Pak 0.3 mg/0.3 mL Soaj injection Generic drug: EPINEPHrine Inject 0.3 mg into the muscle as needed for anaphylaxis.   feeding supplement Liqd Take 237 mLs by mouth 3 (three) times daily between meals.   ferrous sulfate 325 (65 FE) MG tablet Take 325 mg by mouth daily with breakfast.   guaiFENesin-dextromethorphan 100-10 MG/5ML syrup Commonly known as: ROBITUSSIN DM Take 5 mLs by  mouth every 4 (four) hours as needed for cough (chest congestion).   lidocaine 5 % Commonly known as: Lidoderm Place 1 patch onto the skin daily. Remove & Discard patch within 12 hours or as directed by MD   Melatonin 10 MG Caps Take 10 mg by mouth at bedtime.   multivitamin tablet Take 1 tablet by mouth daily.   OLANZapine 5 MG tablet Commonly known as: ZYPREXA Take 1 tablet (5 mg total) by mouth at bedtime.   omeprazole 40 MG capsule Commonly known as: PRILOSEC Take 40 mg by mouth daily.   thiamine 100 MG tablet Take 1 tablet (100 mg total) by mouth daily.   traZODone 50 MG tablet Commonly known as: DESYREL Take 1 tablet (50 mg total) by mouth at bedtime. What changed:  medication strength how much to take   VITAMIN B-12 PO Take 1 tablet by mouth daily.   VITAMIN D PO Take 1 tablet by mouth daily.        Disposition and follow-up:   [name] was discharged from [facility] in [place] condition.  At the hospital follow up visit please address:  1.  Malnutrition 2/2 anorexia nervosa  2.  Labs / imaging needed at time of follow-up: Vitamin E and K  3.  Pending labs/ test needing follow-up: Per GI, will follow-up with full biopsy results;  H. Pylori resulted after patient discharge- positive for rare microorganisms. Recommend outpatient GI follow-up.  Follow-up Appointments:  Follow-up Information     [name], MD .   Specialty: Psychiatry                Hospital Course by problem list: #Anorexia nervosa #Malnutrition After speaking with psych who have discussed the patient with her outpatient providers feeding tube may not be the best option for the patient at this time. It is unlikely that the patient is going to be able to manage a PEG tube by herself and there is also the risk that despite the feeding tube the patient will still choose not to administer tube feeds given her severe ED and cognitive decline.  Further, patient has no oropharyngeal nor esophageal dysfunction preventing oral intake, per EGD.  - psychiatry following, appreciate assistance - Goal setting: patient agreeable to increase PO intake day by day. - nodular ulcerated mucosa noted on EGD, but otherwise exam was without abnormality. Biopsy with rare microorganisms on H. Pylori assay, resulted after patient discharge. Patient asymptomatic throughout admission and on day of discharge.  - Wellbutrin has been decreased due to appetite suppression.  - Cardiac monitoring - monitored electrolytes daily for refeeding syndrome - Continue Ensure - Continue PO thiamine   #Cognitive decline  Patient reports recent difficulty with her memory.  This was confirmed with patient's outpatient RD who also states patient has missed appointments which is not like her and has also gotten lost driving several times.  This has likely worsened due to poor nutritional status. This complicates her ability to manage her care at home. -Pending Vitamins E, K - B12, mag,  CRP, folate, copper, zinc normal   #Depression #Generalized anxiety disorder Psych following, appreciate assistance -zyprexa 5, continue to titrate up - trazodone 50 -cogentin 1 BID; continue to wean - wellbutrin 150; continue to wean due to decreased seizure threshold with patient's anorexia - cymbalta 120   #Gastroparesis Nuclear medicine gastric emptying study from 2007 was abnormal. Did have an EGD with biopsy and Botox injection.  Unclear if this aided in her gastric emptying. - QTC normal on EKG on 2/28, can consider medicine for nausea as needed   #Recent right rib fracture Patient presented to the ER on 2/20 after sustaining a fall.  Found to have right fracture on radiograph.  Discharged on meloxicam and lidocaine patches. -Pain control with Tylenol and lidocaine patches   #Type 2 diabetes mellitus Metformin recently discontinued due to poor oral intake and reported weight  loss. Last A1c 6.0 six months ago   #Hypokalemia; resolved  Discharge Exam:   BP 131/82 (BP Location: Left Arm)    Pulse 71    Temp (!) 97.5 F (36.4 C) (Oral)    Resp 17    Ht 5' (1.524 m)    Wt 39.5 kg    SpO2 96%    BMI 17.01 kg/m  Discharge exam:  Constitutional: thin, frail appearing woman, in no acute distress HEENT: temporal wasting, conjunctiva non-erythematous Cardiovascular: regular rate and rhythm on telemetry Pulmonary/Chest: normal work of breathing on room air, non-productive cough present MSK: decreased muscle bulk, intraphalangeal atrophy noted in bilateral hands. Neurological: alert & oriented x 3, answering questions appropriately Skin: warm and dry Psych: normal affect  Pertinent Labs, Studies, and Procedures:  EGD 2/28 - Nodular ulcetated mucosa in the gastric antrum. Biopsied. - A small amount of food (residue) in the stomach. - A Nissen fundoplication was found. The wrap appears tight. - Normal examined duodenum. - The examination was otherwise normal. -Note she has had delayed gastric emptying issues x many years - ? meds, ? related to fundoplication (sometimes have vagal nerve injuries) and I doubt that is a major issue in her current situation  Discharge Instructions: Follow-up with outpatient therapy, psychiatry, and nutrition. GI to follow-up with results of EGD biopsy.  Signed: Rosezetta Schlatter, MD 04/16/2021, 12:54 PM   Pager: [phone]
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
## 2021-04-14 NOTE — Progress Notes (Signed)
Occupational Therapy Treatment ?Patient Details ?Name: Becky [name] ?MRN: [id number] ?DOB: [birth year] ?Today's Date: 04/14/2021 ? ? ?History of present illness Becky [name] is a 66 y.o. female presented to the ER requesting a feeding tube. Recent fall with right rib fx.Cognitive decline. PHMx: anorexia nervosa, HTN, DM, dissociative identity disorder, chronic pain syndrome, BPPV and gastroparesis status post Botox injections as well as generalized anxiety disorder and reflux. ?  ?OT comments ? Pt. Seen for skilled OT treatment.  Agreeable to participation.  Able to completed LB dressing seated eob.  Min guard a for in room ambulation with no DME to recliner.  Noted intermittent furniture walking for ue support but no LOB.  Agree with current d/c recommendations.     ? ?Recommendations for follow up therapy are one component of a multi-disciplinary discharge planning process, led by the attending physician.  Recommendations may be updated based on patient status, additional functional criteria and insurance authorization. ?   ?Follow Up Recommendations ? Skilled nursing-short term rehab (<3 hours/day)  ?  ?Assistance Recommended at Discharge Frequent or constant Supervision/Assistance  ?Patient can return home with the following ? A little help with walking and/or transfers;A little help with bathing/dressing/bathroom ?  ?Equipment Recommendations ? None recommended by OT  ?  ?Recommendations for Other Services   ? ?  ?Precautions / Restrictions Precautions ?Precautions: Fall  ? ? ?  ? ?Mobility Bed Mobility ?Overal bed mobility: Modified Independent ?  ?  ?  ?  ?  ?  ?  ?  ? ?Transfers ?Overall transfer level: Needs assistance ?Equipment used: None ?Transfers: Sit to/from Stand, Bed to chair/wheelchair/BSC ?Sit to Stand: Min guard ?  ?  ?  ?  ?  ?General transfer comment: min guard from bed, around the bed to recliner, intermmitent reliance on foot of bed for supoort but no visible lob ?  ?  ?Balance    ?  ?  ?  ?  ?  ?  ?  ?  ?  ?  ?  ?  ?  ?  ?  ?  ?  ?  ?   ? ?ADL either performed or assessed with clinical judgement  ? ?ADL Overall ADL's : Needs assistance/impaired ?  ?  ?  ?  ?  ?  ?  ?  ?  ?  ?Lower Body Dressing: Set up;Sitting/lateral leans ?Lower Body Dressing Details (indicate cue type and reason): don sneakers ?Toilet Transfer: Min guard;Ambulation ?Toilet Transfer Details (indicate cue type and reason): observed with in room ambulation and transfer from around bed to recliner, intermittent furniture walking ?  ?  ?  ?  ?Functional mobility during ADLs: Min guard ?General ADL Comments: no dme but did demonstrate furniture walking around bed using foot board. ?  ? ?Extremity/Trunk Assessment   ?  ?  ?  ?  ?  ? ?Vision   ?  ?  ?Perception   ?  ?Praxis   ?  ? ?Cognition Arousal/Alertness: Awake/alert ?Behavior During Therapy: [facility] for tasks assessed/performed ?Overall Cognitive Status: Within Functional Limits for tasks assessed ?  ?  ?  ?  ?  ?  ?  ?  ?  ?  ?  ?  ?  ?  ?  ?  ?  ?  ?  ?   ?Exercises   ? ?  ?Shoulder Instructions   ? ? ?  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 ? ?Pertinent Vitals/ Pain       Pain Assessment ?Pain Assessment: Faces ?Faces Pain Scale: Hurts little more ?Pain Location: right ribs ?Pain Descriptors / Indicators: Sore, Grimacing ?Pain Intervention(s): Patient requesting pain meds-RN notified, Limited activity within patient's tolerance, Monitored during session, Repositioned ? ?Home Living   ?  ?  ?  ?  ?  ?  ?  ?  ?  ?  ?  ?  ?  ?  ?  ?  ?  ?  ? ?  ?Prior Functioning/Environment    ?  ?  ?  ?   ? ?Frequency ? Min 2X/week  ? ? ? ? ?  ?Progress Toward Goals ? ?OT Goals(current goals can now be found in the care plan section) ? Progress towards OT goals: Progressing toward goals ? ?   ?Plan Discharge plan remains appropriate   ? ?Co-evaluation ? ? ?   ?  ?  ?  ?  ? ?  ?AM-PAC OT "6 Clicks" Daily Activity     ?Outcome Measure ? ?  Help from another person eating meals?: None ?Help from another person taking care of personal grooming?: A Little ?Help from another person toileting, which includes using toliet, bedpan, or urinal?: A Little ?Help from another person bathing (including washing, rinsing, drying)?: A Little ?Help from another person to put on and taking off regular upper body clothing?: A Little ?Help from another person to put on and taking off regular lower body clothing?: A Little ?6 Click Score: 19 ? ?  ?End of Session   ? ?OT Visit Diagnosis: Unsteadiness on feet (R26.81);Muscle weakness (generalized) (M62.81);Pain ?Pain - Right/Left: Right ?  ?Activity Tolerance Patient tolerated treatment well ?  ?Patient Left in chair;with call bell/phone within reach;with chair alarm set ?  ?Nurse Communication Other (comment) (alerted rn pt. request for pain meds and also that pt. had stated she was going to have some ensure and requested ice. (i provided the ice)) ?  ? ?   ? ?Time: 4132-4401 ?OT Time Calculation (min): 14 min ? ?Charges: OT General Charges ?$OT Visit: 1 Visit ?OT Treatments ?$Self Care/Home Management : 8-22 mins ? ?Sonia Baller, COTA/L ?Acute Rehabilitation ?[phone]  ? ?Tanya Nones ?04/14/2021, 12:09 PM ?

## 2021-04-14 NOTE — Progress Notes (Signed)
? ? ?Subjective:  ?Overnight Events: No acute overnight events. ? ?States she feels fine aside from a mild cough and some fatigue, which we discussed may be from COVID infection. She has no shortness of breath or other systemic signs. ? ?States that she had 2 ensures yesterday. She has 2 ensures on table, but not currently hungry. Will try to have some later today. ? ?Objective:  ?Vital signs in last 24 hours: ?Vitals:  ? 04/14/21 0000 04/14/21 0450 04/14/21 0750 04/14/21 1213  ?BP: 97/61 94/64 111/72 116/76  ?Pulse: 75 71 68 86  ?Resp: '18 17 13 16  '$ ?Temp: 97.8 ?F (36.6 ?C) 98 ?F (36.7 ?C) 98 ?F (36.7 ?C) 98 ?F (36.7 ?C)  ?TempSrc: Oral Oral Oral Oral  ?SpO2: 95% 97% 95% 97%  ?Weight:      ?Height:      ? ?Supplemental O2: Room Air ?SpO2: 97 % ?O2 Flow Rate (L/min): 4 L/min ? ? ?Physical Exam:  ?General: thin, frail appearing female, NAD. ?HENT: temporal wasting noted. ?CV: normal rate and regular rhythm, no m/r/g. ?Pulm/Chest: CTABL, no adventitious sounds noted. Satting 96% on RA. Mild tenderness over right lateral ribcage with lidocaine patch in place. ?Abdomen: scaphoid, non-distended, normoactive bowel sounds. ?MSK: decreased muscle bulk, intraphalangeal atrophy in b/l hands. ?Neuro: AAOx3, deficits noted in immediate recall. ?Psych: normal affect ? ? ?Filed Weights  ? 04/09/21 0813 04/10/21 1439  ?Weight: 39.5 kg 39.5 kg  ? ? ?No intake or output data in the 24 hours ending 04/14/21 1244 ?Net IO Since Admission: 600 mL [04/14/21 1244] ? ?Pertinent Labs: ?CBC Latest Ref Rng & Units 04/11/2021 04/10/2021 04/09/2021  ?WBC 4.0 - 10.5 K/uL 4.7 2.6(L) 4.3  ?Hemoglobin 12.0 - 15.0 g/dL 10.8(L) 10.4(L) 11.8(L)  ?Hematocrit 36.0 - 46.0 % 33.1(L) 30.7(L) 35.7(L)  ?Platelets 150 - 400 K/uL 202 164 202  ? ? ?CMP Latest Ref Rng & Units 04/11/2021 04/10/2021 04/10/2021  ?Glucose 70 - 99 mg/dL 109(H) 104(H) -  ?BUN 8 - 23 mg/dL 11 7(L) -  ?Creatinine 0.44 - 1.00 mg/dL 0.81 0.78 -  ?Sodium 135 - 145 mmol/L 138 136 -  ?Potassium 3.5  - 5.1 mmol/L 4.0 3.9 -  ?Chloride 98 - 111 mmol/L 103 102 -  ?CO2 22 - 32 mmol/L 28 28 -  ?Calcium 8.9 - 10.3 mg/dL 9.1 8.9 -  ?Total Protein 6.5 - 8.1 g/dL 5.2(L) 4.9(L) 5.0(L)  ?Total Bilirubin 0.3 - 1.2 mg/dL 0.5 0.1(L) 0.3  ?Alkaline Phos 38 - 126 U/L 54 49 44  ?AST 15 - 41 U/L 12(L) 14(L) 14(L)  ?ALT 0 - 44 U/L '9 7 8  '$ ? ? ?Imaging: ?No results found. ? ?Assessment/Plan:  ? ?Principal Problem: ?  Severe malnutrition ([place]) ?Active Problems: ?  Chronic gastritis ?  Protein-calorie malnutrition, severe ?  Mucosal abnormality of stomach ? ? ?Patient Summary: ?Becky [name] is a 66 y.o. with a past medical history of anorexia nervosa, gastroparesis, diabetes, PTSD, anxiety and depression presenting to the ER for malnutrition. ? ? ?#Anorexia nervosa ?#Malnutrition ?After speaking with psych who have discussed the patient with her outpatient providers feeding tube may not be the best option for the patient at this time. It is unlikely that the patient is going to be able to manage a PEG tube by herself, nor is it medically indicated at this time. Per patient, consumed 2 Ensures yesterday, will continue to encourage PO intake while here. ?- psychiatry following, appreciate assistance ?- Goal setting: patient agreeable to increase  PO intake day by day. Goal to drink one full Ensure daily. ?- nodular ulcerated mucosa noted on EGD, but otherwise exam was without abnormality. Biopsy showing chronic active gastritis with detached ulcer and reactive changes, no metaplasia noted. ?- Wellbutrin has been decreased due to appetite suppression. Zyprexa increased to stimulate appetite. ?- Cardiac monitoring ?- monitoring electrolytes for refeeding syndrome ?- Continue Ensure ?- Continue IV thiamine ?  ?#Cognitive decline  ?Patient reports recent difficulty with her memory.  This was confirmed with patient's outpatient RD who also states patient has missed appointments which is not like her and has also gotten lost driving  several times.  This has likely worsened due to poor nutritional status. This complicates her ability to manage her care at home. ?-Follow-up pending Vitamins E, K ?- B12, mag, CRP, folate, copper, zinc normal ?  ?#Depression ?#Generalized anxiety disorder ?Psych following, appreciate assistance ?-zyprexa '5mg'$  qhs, continue to increase ?- Decreasing trazodone to '50mg'$  qhs ?-cogentin '1mg'$  BID, tapering off.  ?- wellbutrin '150mg'$  daily, decreasing further ?- cymbalta '120mg'$  ? ?#Gastroparesis ?Nuclear medicine gastric emptying study from 2007 was abnormal. Did have an EGD with biopsy and Botox injection.  Unclear if this aided in her gastric emptying. ?- QTC normal on EKG on 2/28, can consider medicine for nausea as needed ?  ?#Recent right rib fracture ?Patient presented to the ER on 2/20 after sustaining a fall.  Found to have right fracture on radiograph.  Discharged on meloxicam and lidocaine patches. ?-Pain control with Tylenol and lidocaine patches ?  ?#Type 2 diabetes mellitus ?Metformin recently discontinued due to poor oral intake and reported weight loss. Last A1c 6.0 six months ago ? ?#Hypokalemia; resolved ? ?Prior to Admission Living Arrangement: home ?Anticipated Discharge Location: home vs SNF ?Barriers to Discharge: adequate PO intake; disposition planning ?Dispo: Anticipated discharge to Home vs SNF pending disposition ? ?Virl Axe, MD ?[facility] Resident, PGY-2 ?Pager [phone] ?Please contact the on call pager after 5 pm and on weekends at [phone]. ? ?

## 2021-04-15 DIAGNOSIS — R630 Anorexia: Secondary | ICD-10-CM | POA: Diagnosis not present

## 2021-04-15 DIAGNOSIS — F5000 Anorexia nervosa, unspecified: Secondary | ICD-10-CM | POA: Diagnosis present

## 2021-04-15 DIAGNOSIS — K2950 Unspecified chronic gastritis without bleeding: Secondary | ICD-10-CM | POA: Diagnosis present

## 2021-04-15 DIAGNOSIS — W19XXXA Unspecified fall, initial encounter: Secondary | ICD-10-CM | POA: Diagnosis present

## 2021-04-15 DIAGNOSIS — J45909 Unspecified asthma, uncomplicated: Secondary | ICD-10-CM | POA: Diagnosis present

## 2021-04-15 DIAGNOSIS — K3184 Gastroparesis: Secondary | ICD-10-CM | POA: Diagnosis present

## 2021-04-15 DIAGNOSIS — F411 Generalized anxiety disorder: Secondary | ICD-10-CM | POA: Diagnosis present

## 2021-04-15 DIAGNOSIS — E876 Hypokalemia: Secondary | ICD-10-CM | POA: Diagnosis present

## 2021-04-15 DIAGNOSIS — E860 Dehydration: Secondary | ICD-10-CM | POA: Diagnosis present

## 2021-04-15 DIAGNOSIS — R54 Age-related physical debility: Secondary | ICD-10-CM | POA: Diagnosis present

## 2021-04-15 DIAGNOSIS — E43 Unspecified severe protein-calorie malnutrition: Secondary | ICD-10-CM | POA: Diagnosis present

## 2021-04-15 DIAGNOSIS — K2900 Acute gastritis without bleeding: Secondary | ICD-10-CM | POA: Diagnosis not present

## 2021-04-15 DIAGNOSIS — S2231XA Fracture of one rib, right side, initial encounter for closed fracture: Secondary | ICD-10-CM | POA: Diagnosis present

## 2021-04-15 DIAGNOSIS — I10 Essential (primary) hypertension: Secondary | ICD-10-CM | POA: Diagnosis present

## 2021-04-15 DIAGNOSIS — Z681 Body mass index (BMI) 19 or less, adult: Secondary | ICD-10-CM | POA: Diagnosis not present

## 2021-04-15 DIAGNOSIS — E1143 Type 2 diabetes mellitus with diabetic autonomic (poly)neuropathy: Secondary | ICD-10-CM | POA: Diagnosis present

## 2021-04-15 DIAGNOSIS — R627 Adult failure to thrive: Secondary | ICD-10-CM | POA: Diagnosis present

## 2021-04-15 DIAGNOSIS — U071 COVID-19: Secondary | ICD-10-CM | POA: Diagnosis present

## 2021-04-15 DIAGNOSIS — F4310 Post-traumatic stress disorder, unspecified: Secondary | ICD-10-CM | POA: Diagnosis present

## 2021-04-15 DIAGNOSIS — F309 Manic episode, unspecified: Secondary | ICD-10-CM | POA: Diagnosis present

## 2021-04-15 DIAGNOSIS — D649 Anemia, unspecified: Secondary | ICD-10-CM | POA: Diagnosis present

## 2021-04-15 NOTE — Plan of Care (Signed)

## 2021-04-15 NOTE — Progress Notes (Signed)
? ? ?Subjective:  ?Overnight Events: No acute overnight events. ? ?Becky [name] reports doing well this morning.  States that she is trying to drink Ensure but just not hungry.  Encouraged her to continue trying to increase p.o. intake. ? ?No concerns or complaints at this time. ? ?Objective:  ?Vital signs in last 24 hours: ?Vitals:  ? 04/14/21 0450 04/14/21 0750 04/14/21 1213 04/14/21 2053  ?BP: 94/64 111/72 116/76 103/72  ?Pulse: 71 68 86 72  ?Resp: '17 13 16 17  '$ ?Temp: 98 ?F (36.7 ?C) 98 ?F (36.7 ?C) 98 ?F (36.7 ?C) 98.2 ?F (36.8 ?C)  ?TempSrc: Oral Oral Oral Oral  ?SpO2: 97% 95% 97% 95%  ?Weight:      ?Height:      ? ?Supplemental O2: Room Air ?SpO2: 95 % ?O2 Flow Rate (L/min): 4 L/min ? ? ?Physical Exam:  ?General: Thin, frail-appearing female, NAD. ?HEENT: Temporal wasting noted. ?CV: Normal rate and regular rhythm, no murmurs rubs or gallops. ?Pulm: Normal work of breathing on room air, no respiratory distress noted. ?Abdomen: Scaphoid, nondistended, nontender, normoactive bowel sounds. ?MSK: Decreased muscle bulk ?Neuro: AAOx3, continues to have deficits in immediate recall. ?Psych: Normal affect ? ?Filed Weights  ? 04/09/21 0813 04/10/21 1439  ?Weight: 39.5 kg 39.5 kg  ? ? ? ?Intake/Output Summary (Last 24 hours) at 04/15/2021 1126 ?Last data filed at 04/14/2021 1248 ?Gross per 24 hour  ?Intake 240 ml  ?Output --  ?Net 240 ml  ? ?Net IO Since Admission: 1,080 mL [04/15/21 1126] ? ? ? ?Assessment/Plan:  ? ?Principal Problem: ?  Severe malnutrition ([place]) ?Active Problems: ?  Chronic gastritis ?  Protein-calorie malnutrition, severe ?  Mucosal abnormality of stomach ? ? ?Patient Summary: ?Becky [name] is a 66 y.o. with a past medical history of anorexia nervosa, gastroparesis, diabetes, PTSD, anxiety and depression presenting to the ER for malnutrition. ? ? ?#Anorexia nervosa ?#Malnutrition ?After speaking with psych, who have discussed the patient with her outpatient providers, feeding tube may not be  the best option for the patient at this time. It is unlikely that the patient is going to be able to manage a PEG tube by herself, nor is it medically indicated at this time.  Will continue to encourage p.o. intake as we continue to determine safe dispo planning. ?- psychiatry following, appreciate assistance ?- Goal setting: patient agreeable to increase PO intake day by day. Goal to drink one full Ensure daily. ?- nodular ulcerated mucosa noted on EGD, but otherwise exam was without abnormality. Biopsy showing chronic active gastritis with detached ulcer and reactive changes, no metaplasia noted. ?- Wellbutrin has been decreased due to appetite suppression. Zyprexa increased to stimulate appetite. ?- Cardiac monitoring ?- monitoring electrolytes for refeeding syndrome ?- Continue Ensure, increase amount as tolerated ?- Finished IV thiamine, continue oral supplementation ?  ?#Cognitive decline  ?Patient reports recent difficulty with her memory.  This was confirmed with patient's outpatient RD who also states patient has missed appointments which is not like her and has also gotten lost driving several times.  This has likely worsened due to poor nutritional status. This complicates her ability to manage her care at home. ?-Follow-up pending Vitamins A, E, K ?- B12, mag, CRP, folate, copper, zinc normal ?  ?#Depression ?#Generalized anxiety disorder ?Psych following, appreciate assistance ?-zyprexa '5mg'$  qhs, continue to increase ?-Tapering trazodone, Cogentin, Wellbutrin per psych ?- cymbalta '120mg'$  ? ?#Gastroparesis ?Nuclear medicine gastric emptying study from 2007 was abnormal. Did have an EGD with  biopsy and Botox injection. Unclear if this aided in her gastric emptying. ?- QTC normal on EKG on 2/28, can consider medicine for nausea as needed ?  ?#Recent right rib fracture ?Patient presented to the ER on 2/20 after sustaining a fall.  Found to have right fracture on radiograph.  Discharged on meloxicam and  lidocaine patches. ?-Pain control with Tylenol and lidocaine patches ?  ?#Type 2 diabetes mellitus ?Metformin recently discontinued due to poor oral intake and reported weight loss. Last A1c 6.0 six months ago ? ? ?Prior to Admission Living Arrangement: home ?Anticipated Discharge Location: home vs SNF ?Barriers to Discharge: adequate PO intake; disposition planning ?Dispo: Anticipated discharge to Home vs SNF pending disposition ? ?Virl Axe, MD ?[facility] Resident, PGY-2 ?Pager [phone] ?Please contact the on call pager after 5 pm and on weekends at [phone]. ? ?

## 2021-04-16 DIAGNOSIS — K2900 Acute gastritis without bleeding: Secondary | ICD-10-CM

## 2021-04-16 DIAGNOSIS — R630 Anorexia: Secondary | ICD-10-CM

## 2021-04-16 DIAGNOSIS — E43 Unspecified severe protein-calorie malnutrition: Secondary | ICD-10-CM | POA: Diagnosis not present

## 2021-04-16 LAB — SURGICAL PATHOLOGY

## 2021-04-16 MED ORDER — TRAZODONE HCL 50 MG PO TABS
50.0000 mg | ORAL_TABLET | Freq: Every day | ORAL | 0 refills | Status: DC
Start: 2021-04-16 — End: 2021-07-18

## 2021-04-16 MED ORDER — BUPROPION HCL ER (XL) 150 MG PO TB24: 150.0000 mg | ORAL_TABLET | Freq: Every morning | ORAL | 0 refills | Status: DC

## 2021-04-16 MED ORDER — OLANZAPINE 5 MG PO TABS: 5.0000 mg | ORAL_TABLET | Freq: Every day | ORAL | 0 refills | Status: DC

## 2021-04-16 MED ORDER — GUAIFENESIN-DM 100-10 MG/5ML PO SYRP: 5.0000 mL | ORAL_SOLUTION | ORAL | 0 refills | Status: DC | PRN

## 2021-04-16 MED ORDER — DULOXETINE HCL 60 MG PO CPEP: 120.0000 mg | ORAL_CAPSULE | Freq: Every day | ORAL | 0 refills | Status: DC

## 2021-04-16 MED ORDER — ENSURE ENLIVE PO LIQD: 237.0000 mL | Freq: Three times a day (TID) | ORAL | 12 refills | Status: DC

## 2021-04-16 MED ORDER — BENZTROPINE MESYLATE 1 MG PO TABS: 1.0000 mg | ORAL_TABLET | Freq: Two times a day (BID) | ORAL | 0 refills | Status: DC

## 2021-04-16 NOTE — TOC Transition Note (Signed)
Transition of Care (TOC) - CM/SW Discharge Note ? ? ?Patient Details  ?Name: Becky [name] ?MRN: [id number] ?Date of Birth: Sep 22, 1955 ? ?Transition of Care (TOC) CM/SW Contact:  ?Cyndi Bender, RN ?Phone Number: ?04/16/2021, 3:51 PM ? ? ?Clinical Narrative:    ?Patient stable for discharge. ?Orders for home health RN and SW. ?Spoke to patient and offered choice. ?Patient requested RNCM find highly rated agency. ?Spoke to [place] with [facility] and referral accepted. ?Patient is going to take a taxi home.  ? ? ? ?Final next level of care: [facility] ?Barriers to Discharge: Continued Medical Work up ? ? ?Patient Goals and CMS Choice ?Patient states their goals for this hospitalization and ongoing recovery are:: to go home, improve nutrition ?CMS Medicare.gov Compare Post Acute Care list provided to:: Patient ?Choice offered to / list presented to : Patient ? ?Discharge Placement ?  ?           ? Home w/ hh ?  ?  ?  ? ?Discharge Plan and Services ?  ?Discharge Planning Services: CM Consult ?Post Acute Care Choice: Durable Medical Equipment          ?  ?  ?  ?  ?  ?HH Arranged: Therapist, sports, Social Work ?[place] Agency: [facility] [place] ?Date HH Agency Contacted: 04/16/21 ?Time [place]: 9622 ?Representative spoke with at [place]: Tommi Rumps ? ?Social Determinants of Health (SDOH) Interventions ?  ? ? ?Readmission Risk Interventions ?No flowsheet data found. ? ? ? ? ?

## 2021-04-16 NOTE — Progress Notes (Signed)
Occupational Therapy Treatment ?Patient Details ?Name: Becky [name] ?MRN: [id number] ?DOB: Aug 11, 1955 ?Today's Date: 04/16/2021 ? ? ?History of present illness Becky [name] is a 66 y.o. female presented to the ER requesting a feeding tube. Recent fall with right rib fx.Cognitive decline. PHMx: anorexia nervosa, HTN, DM, dissociative identity disorder, chronic pain syndrome, BPPV and gastroparesis status post Botox injections as well as generalized anxiety disorder and reflux. ?  ?OT comments ? Patient taking self to bathroom without an assistive device when OTA entered room. Patient performed grooming standing at sink and performed grooming without assistance. Patient was able to donn tennis shoes while seated on EOB and performed functional mobility and balance tasks in room without LOB.  Patient making good gains and is expected to return home today with HHOT recommended to further address safety and cognitive tasks.   ? ?Recommendations for follow up therapy are one component of a multi-disciplinary discharge planning process, led by the attending physician.  Recommendations may be updated based on patient status, additional functional criteria and insurance authorization. ?   ?Follow Up Recommendations ? Home health OT  ?  ?Assistance Recommended at Discharge Intermittent Supervision/Assistance  ?Patient can return home with the following ? A little help with walking and/or transfers;A little help with bathing/dressing/bathroom ?  ?Equipment Recommendations ? None recommended by OT  ?  ?Recommendations for Other Services   ? ?  ?Precautions / Restrictions Precautions ?Precautions: Fall ?Restrictions ?Weight Bearing Restrictions: No  ? ? ?  ? ?Mobility Bed Mobility ?Overal bed mobility: Modified Independent ?  ?  ?  ?  ?  ?  ?General bed mobility comments: able to get to EOB without assitance ?  ? ?Transfers ?Overall transfer level: Needs assistance ?Equipment used: None ?Transfers: Sit to/from  Stand, Bed to chair/wheelchair/BSC ?Sit to Stand: Supervision ?  ?  ?  ?  ?  ?General transfer comment: performed transfers in room without assistance ?  ?  ?Balance Overall balance assessment: Needs assistance ?Sitting-balance support: Feet supported, No upper extremity supported ?Sitting balance-Leahy Scale: Good ?  ?  ?Standing balance support: During functional activity ?Standing balance-Leahy Scale: Fair ?Standing balance comment: able to perform standing balance tasks with no loss of balance ?  ?  ?  ?  ?  ?  ?  ?  ?  ?  ?  ?   ? ?ADL either performed or assessed with clinical judgement  ? ?ADL Overall ADL's : Needs assistance/impaired ?  ?  ?Grooming: Wash/dry hands;Supervision/safety;Standing ?Grooming Details (indicate cue type and reason): washed hands following toileting ?  ?  ?  ?  ?  ?  ?Lower Body Dressing: Supervision/safety;Sitting/lateral leans ?Lower Body Dressing Details (indicate cue type and reason): don sneakers ?Toilet Transfer: Modified Independent ?Toilet Transfer Details (indicate cue type and reason): took self to bathroom without AD ?Toileting- Clothing Manipulation and Hygiene: Independent ?Toileting - Clothing Manipulation Details (indicate cue type and reason): performed all toileting tasks without assitance ?  ?  ?  ?General ADL Comments: performed mobility in room without holding onto furniture ?  ? ?Extremity/Trunk Assessment   ?  ?  ?  ?  ?  ? ?Vision   ?  ?  ?Perception   ?  ?Praxis   ?  ? ?Cognition Arousal/Alertness: Awake/alert ?Behavior During Therapy: [facility] for tasks assessed/performed ?Overall Cognitive Status: Within Functional Limits for tasks assessed ?  ?  ?  ?  ?  ?  ?  ?  ?  ?  ?  ?  ?  ?  ?  ?  ?  General Comments: demonstrated good safety for functional tasks ?  ?  ?   ?Exercises Exercises: Other exercises ?Other Exercises ?Other Exercises: dynamic standing balance activites performed without LOB ? ?  ?Shoulder Instructions   ? ? ?  ?General Comments    ? ? ?Pertinent  Vitals/ Pain       Pain Assessment ?Pain Assessment: Faces ?Faces Pain Scale: Hurts a little bit ?Pain Location: right ribs ?Pain Descriptors / Indicators: Sore, Grimacing ?Pain Intervention(s): Monitored during session ? ?Home Living   ?  ?  ?  ?  ?  ?  ?  ?  ?  ?  ?  ?  ?  ?  ?  ?  ?  ?  ? ?  ?Prior Functioning/Environment    ?  ?  ?  ?   ? ?Frequency ? Min 2X/week  ? ? ? ? ?  ?Progress Toward Goals ? ?OT Goals(current goals can now be found in the care plan section) ? Progress towards OT goals: Progressing toward goals ? ?Acute Rehab OT Goals ?Patient Stated Goal: go home ?OT Goal Formulation: With patient ?Time For Goal Achievement: 04/27/21 ?Potential to Achieve Goals: Good ?ADL Goals ?Pt Will Perform Grooming: Independently;standing ?Pt Will Perform Upper Body Bathing: Independently;standing ?Pt Will Perform Lower Body Bathing: Independently ?Pt Will Perform Upper Body Dressing: Independently;standing ?Pt Will Perform Lower Body Dressing: Independently;sit to/from stand ?Pt Will Transfer to Toilet: Independently;ambulating;regular height toilet ?Pt Will Perform Toileting - Clothing Manipulation and hygiene: Independently;sit to/from stand  ?Plan Discharge plan remains appropriate   ? ?Co-evaluation ? ? ?   ?  ?  ?  ?  ? ?  ?AM-PAC OT "6 Clicks" Daily Activity     ?Outcome Measure ? ? Help from another person eating meals?: None ?Help from another person taking care of personal grooming?: A Little ?Help from another person toileting, which includes using toliet, bedpan, or urinal?: None ?Help from another person bathing (including washing, rinsing, drying)?: A Little ?Help from another person to put on and taking off regular upper body clothing?: A Little ?Help from another person to put on and taking off regular lower body clothing?: A Little ?6 Click Score: 20 ? ?  ?End of Session   ? ?OT Visit Diagnosis: Unsteadiness on feet (R26.81);Muscle weakness (generalized) (M62.81);Pain ?Pain - Right/Left: Right ?Pain  - part of body:  (rib) ?  ?Activity Tolerance Patient tolerated treatment well ?  ?Patient Left in bed;with call bell/phone within reach (seated on EOB) ?  ?Nurse Communication Mobility status ?  ? ?   ? ?Time: 1660-6004 ?OT Time Calculation (min): 22 min ? ?Charges: OT General Charges ?$OT Visit: 1 Visit ?OT Treatments ?$Self Care/Home Management : 8-22 mins ? ?Lodema Hong, OTA ?Acute Rehabilitation Services  ?Pager [phone] ?Office [phone] ? ? ?Trixie Dredge ?04/16/2021, 3:09 PM ?

## 2021-04-16 NOTE — Progress Notes (Signed)
Nsg Discharge Note ? ?Admit Date:  04/09/2021 ?Discharge date: 04/16/2021 ?  ?Sable Feil to be D/C'd Home per MD order.  AVS completed.    ? ?Discharge Medication: ?Allergies as of 04/16/2021   ? ?   Reactions  ? Methadone Shortness Of Breath, Other (See Comments)  ? Chest Pain  ? Nitrofurantoin Shortness Of Breath, Other (See Comments)  ? Chest Pain  ? Oxycodone-acetaminophen Shortness Of Breath, Other (See Comments)  ? Chest pain   ? Percocet [oxycodone-acetaminophen] Shortness Of Breath, Other (See Comments)  ? Chest pain  ? Penicillins Nausea And Vomiting, Rash, Other (See Comments), Palpitations  ? Fever, including amoxicillin  ?Has patient had a PCN reaction causing immediate rash, facial/tongue/throat swelling, SOB or lightheadedness with hypotension: Yes ?Has patient had a PCN reaction causing severe rash involving mucus membranes or skin necrosis: No ?Has patient had a PCN reaction that required hospitalization No ?Has patient had a PCN reaction occurring within the last 10 years: No ?If all of the above answers are "NO", then may proceed with Cephalosporin use. ?Fever, including amoxicillin  ?Has patient had a PCN reaction causing immediate rash, facial/tongue/throat swelling, SOB or lightheadedness with hypotension: Yes ?Has patient had a PCN reaction causing severe rash involving mucus membranes or skin necrosis: No ?Has patient had a PCN reaction that required hospitalization No ?Has patient had a PCN reaction occurring within the last 10 years: No ?If all of the above answers are "NO", then may proceed with Cephalosporin use. ?Fever, including amoxicillin   ? Amoxicillin Rash  ? Aspirin Other (See Comments)  ? stomach pain ?stomach pain ?stomach pain  ? Clarithromycin Rash, Other (See Comments)  ? Fever  ? Codeine Nausea And Vomiting, Other (See Comments)  ? Hydrocodone-acetaminophen Itching  ? Premeditate benadryl  ? Morphine Nausea And Vomiting, Other (See Comments)  ? Ms Contin [morphine  Sulfate] Nausea And Vomiting  ? Morphine IR and ER  ? Propoxyphene Nausea Only  ? Sick to stomach  ? ?  ? ?  ?Medication List  ?  ? ?STOP taking these medications   ? ?ARIPiprazole 15 MG tablet ?Commonly known as: ABILIFY ?  ?Eszopiclone 3 MG Tabs ?  ?ibuprofen 200 MG tablet ?Commonly known as: ADVIL ?  ?meloxicam 7.5 MG tablet ?Commonly known as: Mobic ?  ?metFORMIN 500 MG 24 hr tablet ?Commonly known as: GLUCOPHAGE-XR ?  ?QUEtiapine 50 MG tablet ?Commonly known as: SEROQUEL ?  ? ?  ? ?TAKE these medications   ? ?acetaminophen 500 MG tablet ?Commonly known as: TYLENOL ?Take 1,000 mg by mouth every 6 (six) hours as needed for mild pain. ?  ?albuterol 108 (90 Base) MCG/ACT inhaler ?Commonly known as: ProAir HFA ?INHALE 2 PUFFS INTO THE LUNGS EVERY 6 HOURS AS NEEDED ?What changed:  ?how much to take ?how to take this ?when to take this ?reasons to take this ?additional instructions ?  ?benztropine 1 MG tablet ?Commonly known as: COGENTIN ?Take 1 tablet (1 mg total) by mouth 2 (two) times daily. ?What changed:  ?medication strength ?how much to take ?  ?buPROPion 150 MG 24 hr tablet ?Commonly known as: WELLBUTRIN XL ?Take 1 tablet (150 mg total) by mouth every morning. ?What changed: how much to take ?  ?DULoxetine 60 MG capsule ?Commonly known as: CYMBALTA ?Take 2 capsules (120 mg total) by mouth daily. ?Start taking on: April 17, 2021 ?  ?EpiPen 2-Pak 0.3 mg/0.3 mL Soaj injection ?Generic drug: EPINEPHrine ?Inject 0.3 mg into the muscle  as needed for anaphylaxis. ?  ?feeding supplement Liqd ?Take 237 mLs by mouth 3 (three) times daily between meals. ?  ?ferrous sulfate 325 (65 FE) MG tablet ?Take 325 mg by mouth daily with breakfast. ?  ?guaiFENesin-dextromethorphan 100-10 MG/5ML syrup ?Commonly known as: ROBITUSSIN DM ?Take 5 mLs by mouth every 4 (four) hours as needed for cough (chest congestion). ?  ?lidocaine 5 % ?Commonly known as: Lidoderm ?Place 1 patch onto the skin daily. Remove & Discard patch within 12  hours or as directed by MD ?  ?Melatonin 10 MG Caps ?Take 10 mg by mouth at bedtime. ?  ?multivitamin tablet ?Take 1 tablet by mouth daily. ?  ?OLANZapine 5 MG tablet ?Commonly known as: ZYPREXA ?Take 1 tablet (5 mg total) by mouth at bedtime. ?  ?omeprazole 40 MG capsule ?Commonly known as: PRILOSEC ?Take 40 mg by mouth daily. ?  ?thiamine 100 MG tablet ?Take 1 tablet (100 mg total) by mouth daily. ?  ?traZODone 50 MG tablet ?Commonly known as: DESYREL ?Take 1 tablet (50 mg total) by mouth at bedtime. ?What changed:  ?medication strength ?how much to take ?  ?VITAMIN B-12 PO ?Take 1 tablet by mouth daily. ?  ?VITAMIN D PO ?Take 1 tablet by mouth daily. ?  ? ?  ? ? ?Discharge Assessment: ?Vitals:  ? 04/16/21 0814 04/16/21 1216  ?BP: 103/64 131/82  ?Pulse: 70 71  ?Resp: 16 17  ?Temp: 98.1 ?F (36.7 ?C) (!) 97.5 ?F (36.4 ?C)  ?SpO2: 93% 96%  ? Skin clean, dry and intact without evidence of skin break down, no evidence of skin tears noted. ?IV catheter discontinued intact. Site without signs and symptoms of complications - no redness or edema noted at insertion site, patient denies c/o pain - only slight tenderness at site.  Dressing with slight pressure applied. ? ?D/c Instructions-Education: ?Discharge instructions given to patient/family with verbalized understanding. ?D/c education completed with patient/family including follow up instructions, medication list, d/c activities limitations if indicated, with other d/c instructions as indicated by MD - patient able to verbalize understanding, all questions fully answered. ?Patient instructed to return to ED, call 911, or call MD for any changes in condition.  ?Patient escorted via [place], and D/C home via taxi. ? ?Hiram Comber, RN ?04/16/2021 6:18 PM  ? ?

## 2021-04-16 NOTE — Plan of Care (Signed)
Encouraged patient to cough and take deep breaths. Patient also attempting ot eat meals, able to tolerate ensure.  ?Problem: Education: ?Goal: Knowledge of General Education information will improve ?Description: Including pain rating scale, medication(s)/side effects and non-pharmacologic comfort measures ?Outcome: Progressing ?  ?Problem: Health Behavior/Discharge Planning: ?Goal: Ability to manage health-related needs will improve ?Outcome: Progressing ?  ?Problem: Clinical Measurements: ?Goal: Ability to maintain clinical measurements within normal limits will improve ?Outcome: Progressing ?Goal: Will remain free from infection ?Outcome: Progressing ?Goal: Diagnostic test results will improve ?Outcome: Progressing ?Goal: Respiratory complications will improve ?Outcome: Progressing ?Goal: Cardiovascular complication will be avoided ?Outcome: Progressing ?  ?Problem: Activity: ?Goal: Risk for activity intolerance will decrease ?Outcome: Progressing ?  ?Problem: Nutrition: ?Goal: Adequate nutrition will be maintained ?Outcome: Progressing ?  ?Problem: Coping: ?Goal: Level of anxiety will decrease ?Outcome: Progressing ?  ?Problem: Elimination: ?Goal: Will not experience complications related to bowel motility ?Outcome: Progressing ?Goal: Will not experience complications related to urinary retention ?Outcome: Progressing ?  ?

## 2021-04-17 ENCOUNTER — Telehealth: Payer: Medicare Other | Admitting: Family Medicine

## 2021-04-18 ENCOUNTER — Other Ambulatory Visit: Payer: Self-pay

## 2021-04-18 ENCOUNTER — Telehealth: Payer: Self-pay | Admitting: Internal Medicine

## 2021-04-18 DIAGNOSIS — B9681 Helicobacter pylori [H. pylori] as the cause of diseases classified elsewhere: Secondary | ICD-10-CM

## 2021-04-18 DIAGNOSIS — K2970 Gastritis, unspecified, without bleeding: Secondary | ICD-10-CM

## 2021-04-18 MED ORDER — METRONIDAZOLE 250 MG PO TABS: 250.0000 mg | ORAL_TABLET | Freq: Four times a day (QID) | ORAL | 0 refills | Status: AC

## 2021-04-18 MED ORDER — DOXYCYCLINE HYCLATE 50 MG PO CAPS: 100.0000 mg | ORAL_CAPSULE | Freq: Two times a day (BID) | ORAL | 0 refills | Status: AC

## 2021-04-18 NOTE — Telephone Encounter (Signed)
Patient called back with several questions regarding your conversation.  She is asking if she can be put back in the hospital and then discharged to assisted living.  Please call patient and advise.  Thank you. ?

## 2021-04-19 NOTE — Telephone Encounter (Signed)
Pt stated that she feels like she left the hospital to early and is requesting to be put in an assisted living: Pt stated they recommended this while she was in the hospital but declined. Pt was informed to reach out to her PCP in regard to this request or go to ED. Pt questioned her treatment to  the H Pylori treatment. Pt was encouraged to review my chart message that was sent to pt yesterday: ?Pt verbalized understanding with all questions answered.  ? ? ?

## 2021-04-19 NOTE — Telephone Encounter (Signed)
Patient called back again this morning and would like to speak with you.  Please call. ?

## 2021-04-21 LAB — VITAMIN K1, SERUM: VITAMIN K1: 0.44 ng/mL (ref 0.10–2.20)

## 2021-04-23 ENCOUNTER — Other Ambulatory Visit: Payer: Self-pay

## 2021-04-23 ENCOUNTER — Encounter: Payer: Self-pay | Admitting: Family Medicine

## 2021-04-23 ENCOUNTER — Ambulatory Visit (INDEPENDENT_AMBULATORY_CARE_PROVIDER_SITE_OTHER): Payer: Medicare Other | Admitting: Family Medicine

## 2021-04-23 DIAGNOSIS — F5000 Anorexia nervosa, unspecified: Secondary | ICD-10-CM

## 2021-04-23 NOTE — Progress Notes (Signed)
Medical Nutrition Therapy (MNT)  ?PCP: Kathryne Eriksson, MD ?Neurologist: Margette Fast, MD ?Gastroenterologist: Silvano Rusk, MD ?Therapist: Rea College, APRN, MSN, CS  ?Psychiatrist: [name], MD ? ?Appt start time: 1500 end time: 1600 (1 hour) ? 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 ? ?Relevant history/background: Becky [name] was hospitalized 2/27 to 04/16/21 for malnutrition, including low K+ and weight loss.  While hospitalized, she managed to eat part of meals presented, and to consume 1-2 Ensure drinks per day.  She has since been diagnosed with H. Pylori, and was prescribed a multiple-medication regimen by gastroenterologist Silvano Rusk, MD.  Vercie is doubtful that she can manage this treatment regimen on her own, and has asked her PCP's office to help with either accessing home health care or admission to an assisted living facility.   ? ?Assessment: Becky [name] still expressed ambivalence about weight gain while hospitalized, even while requesting a feeding tube of some kind.  She saw Dr. Rayann Heman at [facility], who suggested she visit 3 assisted living facilities, names of which she got from a rep from [email or web address], V[place]:  [facility], [facility], and [facility] on [address].  Dodi sees Dr. Albertine Patricia tomorrow to straighten out which med's she should be taking.  She has not started any of the meds prescribed by Dr. Carlean Purl, and she is confused re. med changes that were made in the hospital.  Since being home (1 week now), she has increased eating some.  Yesterday's food recall indicates a substantial increase from pre-hospitalization (1340 kcal).  Memory loss and confusion remain concerning.   ?Weight: 98.6 lb.  (Weight was 93 lb on 01/24/21 and 126 lb on 08/16/20.) ?Usual eating pattern: 1-2 meals and 2-3 snacks per day, including Ensures.  (Using Ensure original; could not find any Ensure Plus.)  ?Physical  activity: None currently.  ?Sleep: Estimates 4-5 hrs sleep/night. ?24-hr recall suggests intake of ~1340 kcal:  ?(Up at 7 AM) ?B (7 AM)-  1 Ensure (original)    220 ?Snk ( AM)-  --- ?L (3 PM)-  1 flour tortilla, 2 tbsp peanut butter, water 300 ?Snk (3:30)-  1 Ensure     220 ?Snk (4:30)-  1/2 c apple sauce, water     60 ?D (6:30 PM)-  1 veg burger on bun, 1 orange, water  320 ?Snk ( PM)-  1 Ensure     220 ?Typical day? Yes.  Usually getting 3 regular Ensures/day.   ? ?Previous kcal intake estimates: ?01/22/21:   720 ?11/17:   1160 ?08/29/20:    870  ?07/31/20:   875  ?06/29/20:    850  ?04/20/20:    700 ?03/23/20:             650 ?02/21/20   625 ?01/17/20   570 ?12/13/19   500 ? ?Intervention: Reviewed recent diet and exercise history, and encouraged getting a dentist appt asap.  Also endorsed patient's plan of residential treatment.   ? ?For recommendations and goals, see Patient Instructions.   ? ?Follow-up: In-office appt in 8 weeks.  ? ?[place]co Mcgrory,[name]  ?  ?

## 2021-04-23 NOTE — Patient Instructions (Signed)
Shop today or tomorrow:   ?Nuts ?Sunflower seeds ?Grapenuts ?Canned beans ?Oranges or other fruit ?Frozen blueberries ? ?Goals:   ?1. At least 3 times per day, eat a meal that includes a protein source, a carb food (e.g., bread, cereal, crackers, cookies), plus fruit and/or vegetable serving.     ?     - Breakfast:  Yogurt, cereal, nuts/seeds such as sunflower seeds, cashews or almonds, fresh fruit or frozen blueberries.   ?     - Lunch: Potato with broc & cheese; PB sandwich with fruit and/or veg; [place] yogurt with Grapenuts along with fruit (banana or berries, or orange on the side); veggie burger on bun with salad or other veg; 1 cup beans, 1 cup rice, vegetables.   ? ?2. Drink 3 Ensure Pluses per day, plus snacks as you can fit them in.   ?    - Snacks:  Any fruit, apple sauce, string cheese, yogurt, 1/4 cup nuts, peanut butter sandwich (2 tbsp peanut butter), cereal with yogurt, crackers with cheese or peanut butter. ? ?- Schedule meals in your phone with an alarm on them.  Remember to re-set alarms each day.  ? ?- Please update [name] with respect to assisted living facility.   ? ?Follow-up in-office appt on Monday, April 10 at 11 AM.   ?  ?

## 2021-04-25 LAB — VITAMIN E
Vitamin E (Alpha Tocopherol): 11.6 mg/L (ref 9.0–29.0)
Vitamin E(Gamma Tocopherol): 0.4 mg/L — ABNORMAL LOW (ref 0.5–4.9)

## 2021-04-25 LAB — VITAMIN A: Vitamin A (Retinoic Acid): 44.5 ug/dL (ref 22.0–69.5)

## 2021-05-15 ENCOUNTER — Other Ambulatory Visit: Payer: Self-pay | Admitting: Family Medicine

## 2021-05-15 DIAGNOSIS — N644 Mastodynia: Secondary | ICD-10-CM

## 2021-05-15 DIAGNOSIS — Z1231 Encounter for screening mammogram for malignant neoplasm of breast: Secondary | ICD-10-CM

## 2021-05-21 ENCOUNTER — Ambulatory Visit (INDEPENDENT_AMBULATORY_CARE_PROVIDER_SITE_OTHER): Payer: Medicare Other | Admitting: Family Medicine

## 2021-05-21 ENCOUNTER — Encounter: Payer: Self-pay | Admitting: Family Medicine

## 2021-05-21 DIAGNOSIS — F5001 Anorexia nervosa, restricting type: Secondary | ICD-10-CM

## 2021-05-21 DIAGNOSIS — F5000 Anorexia nervosa, unspecified: Secondary | ICD-10-CM

## 2021-05-21 NOTE — Progress Notes (Signed)
Medical Nutrition Therapy (MNT)  ?PCP: Kathryne Eriksson, MD ?Neurologist: Margette Fast, MD ?Gastroenterologist: Silvano Rusk, MD ?Therapist: Rea College, APRN, MSN, CS  ?Psychiatrist: [name], MD ? ?Appt start time: 1100 end time: 12 00 (1 hour) ? 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 ? ?Relevant history/background: Becky [name] was hospitalized 2/27 to 04/16/21 for malnutrition, including low K+ and weight loss.  While hospitalized, she managed to eat part of meals presented, and to consume 1-2 Ensure drinks per day.  She has since been diagnosed with H. Pylori, and was prescribed a multiple-medication regimen by gastroenterologist Silvano Rusk, MD.  Becky [name] is doubtful that she can manage this treatment regimen on her own, and has asked her PCP's office to help with either accessing home health care or admission to an assisted living facility.   ? ?Assessment: Becky [name]'s food intake has increased, and most importantly, is more consistent.  She still has occasional dizzy spells, but memory problems seem to have resolved as she has increased intake.  Energy level is also much better, although still not yet what she'd like it to be.  She stopped exploring options for home health service or assisted living facility b/c she feels she fully capable of  living on her own at home.  She completed the antibiotic course prescribed for H pylori by Dr. Carlean Purl, and will f/u with him this week.  Sodium level has been low, checked twice last week (129 on 4/6).  She will will see PA Bing Matter this week to f/u on labs.  Becky [name] had one top front and two lower front teeth extracted in March, and cannot get further dental work done b/c of cost.  This impacts what she is able to eat, and she is self-conscious about her missing teeth.  Significantly, however, she hosted a group of 3 women at her apt last week for snacks.  Her mom has moved in with  Becky [name]'s sister Becky [name], so Becky [name] is able to be home more now, and has become reacquainted with some of her neighbors.  This has huge implications for [name]'s mental health and overall well-being.   ? ?Becky [name] continues to see her counselor 1-2 X wk.   ? ?Becky [name]'s goals are to go to bed by 9 PM, get up at 5 AM, and walk her dog early in the AM.   ? ?Weight: 105.2 lb.  (Weight was 98.6 lb on 04/23/21 and 126 lb on 08/16/20.)   ?Usual eating pattern: 2-3 meals and 2 snacks per day, including Ensures.   ?Physical activity: None currently.  ?Sleep: Estimates 4-5 hrs sleep/night. ?24-hr recall suggests intake of ~1310 kcal:  ?(Up at 7 AM) ?B (7:15 AM)-  1 pkt flavored oatmeal, 5 oz Grk fruit yogurt, 1 bottle Ensure Max Pro (30 g pro, 150 kcal)  ?Snk (10 AM)-  1 c ice cream       280 + 270 ?L (2 PM)-  2 water crkrs, 1 oz Gouda cheese, 1 Ens Max, water 280 ?Snk (4 PM)-  1 c ice cream       270 ?D (8 PM)-  4 whwht water crackers, 1 1/2 oz Gouda cheese, water 210 ?Snk ( PM)-  ---         ?Typical day? No. More typical is lunch of sandwich (grilled chs or PB&honey) with yogurt; dinner is typically canned soup with crackers or baked potato with cheese and broccoli.   ? ?Previous kcal intake estimates: ?  04/23/21:  1340  ?01/22/21:   720 ?11/17:   1160 ?08/29/20:    870  ?07/31/20:   875  ?06/29/20:    850  ?04/20/20:    700 ?03/23/20:             650 ?02/21/20   625 ?01/17/20   570 ?12/13/19   500 ? ?Intervention: Reviewed recent diet and exercise history, and encouraged getting a dentist appt asap.  Also endorsed patient's plan of residential treatment.   ? ?For recommendations and goals, see Patient Instructions.   ? ?Follow-up: In-office appt in 4 weeks.  ? ?Becky [name],Becky [name]  ?  ?

## 2021-05-21 NOTE — Patient Instructions (Signed)
Water intake: Try pouring tap water, and allow it to sit uncovered for a couple of hours or overnight.  You may find this tastes better once the chlorine has dissipated.   ? ?Goals:   ?1. At least 3 times per day, eat a meal that includes a protein source, a carb food (e.g., bread, cereal, crackers, cookies), plus fruit and/or vegetable serving.     ?     - Breakfast:  Yogurt, cereal, nut butter, fresh fruit or frozen blueberries.   ?     - Lunch: Potato with broc & cheese; PB sandwich with fruit and/or veg; veggie burger on bun with salad or other veg; 1 cup beans, 1 cup rice, and vegetables.   ?  ?2. Include at least 2 Ensures per day, plus at least 2 snacks per day (3 is better).   ?    - Snacks:  Any fruit, apple sauce, string cheese, yogurt, 1/2 peanut butter sandwich (2 tbsp peanut butter), cereal with yogurt, crackers with cheese or peanut butter.   ?    - Ensures: 16 g of protein is usually going to be sufficient.  It will be better to get the Ensure with this amt of protein and 350 calories vs. Ensure Max Protein.  Use the high-protein Ensures for snacks rather than at meals.   ? ?- Continue to schedule meals in your phone with an alarm on them.  Remember to re-set alarms each day.  ? ?- Continue to document foods eaten (for [place]).   ? ?Follow-up in-office appt on Tuesday, May 9 at 11 AM. ?  ?

## 2021-05-24 ENCOUNTER — Other Ambulatory Visit: Payer: Medicare Other

## 2021-05-24 ENCOUNTER — Ambulatory Visit (INDEPENDENT_AMBULATORY_CARE_PROVIDER_SITE_OTHER): Payer: Medicare Other | Admitting: Internal Medicine

## 2021-05-24 ENCOUNTER — Encounter: Payer: Self-pay | Admitting: Internal Medicine

## 2021-05-24 VITALS — BP 118/66 | HR 51 | Ht 60.0 in | Wt 106.8 lb

## 2021-05-24 DIAGNOSIS — B9681 Helicobacter pylori [H. pylori] as the cause of diseases classified elsewhere: Secondary | ICD-10-CM | POA: Diagnosis not present

## 2021-05-24 DIAGNOSIS — K2970 Gastritis, unspecified, without bleeding: Secondary | ICD-10-CM

## 2021-05-24 DIAGNOSIS — Z9889 Other specified postprocedural states: Secondary | ICD-10-CM

## 2021-05-24 DIAGNOSIS — Z8719 Personal history of other diseases of the digestive system: Secondary | ICD-10-CM
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
## 2021-05-24 NOTE — Patient Instructions (Signed)
Stop your omeprazole then after 2 weeks do your stool test please. ? ?Due to recent changes in healthcare laws, you may see the results of your imaging and laboratory studies on MyChart before your provider has had a chance to review them.  We understand that in some cases there may be results that are confusing or concerning to you. Not all laboratory results come back in the same time frame and the provider may be waiting for multiple results in order to interpret others.  Please give [place] 48 hours in order for your provider to thoroughly review all the results before contacting the office for clarification of your results.  ? ? ?I appreciate the opportunity to care for you. ?Silvano Rusk, MD, [facility] ?

## 2021-06-01 ENCOUNTER — Encounter: Payer: Self-pay | Admitting: Family Medicine

## 2021-06-01 ENCOUNTER — Ambulatory Visit: Payer: Medicare Other | Admitting: Family Medicine

## 2021-06-01 ENCOUNTER — Ambulatory Visit (INDEPENDENT_AMBULATORY_CARE_PROVIDER_SITE_OTHER): Payer: Medicare Other | Admitting: Family Medicine

## 2021-06-01 VITALS — BP 123/71 | Ht 60.0 in | Wt 104.0 lb

## 2021-06-01 DIAGNOSIS — M1712 Unilateral primary osteoarthritis, left knee: Secondary | ICD-10-CM

## 2021-06-01 DIAGNOSIS — M180 Bilateral primary osteoarthritis of first carpometacarpal joints: Secondary | ICD-10-CM | POA: Diagnosis not present

## 2021-06-01 MED ORDER — METHYLPREDNISOLONE ACETATE 40 MG/ML IJ SUSP: 40.0000 mg | Freq: Once | INTRAMUSCULAR | Status: DC

## 2021-06-01 NOTE — Assessment & Plan Note (Signed)
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  We discussed.  We will try over-the-counter Voltaren gel/Aspercreme.  Also discussed using thumb sleeve and gave her information on that.  Should it become significantly worse and she wants to consider injection, she will follow-up. ?

## 2021-06-01 NOTE — Patient Instructions (Signed)
? ?  This is a CMC (thumb joint) sleeve. You can find them at most pharmacies. They usually cost about $20  and I would not spend much more than that until you see if it helps. Intermittent use is what most people so ? ?If this does not help your knee pretty well in next 2 weeks or so, call the office or use My Chart and we will set up CT scan of left knee. ? ?Great to see you! ?

## 2021-06-01 NOTE — Progress Notes (Signed)
?  Becky [name] - 66 y.o. female MRN [id number]  Date of birth: [birth year] ? ? ? ?SUBJECTIVE:    ?  ?Chief Complaint:/ HPI:  ?Left knee pain.  Started bothering her about 10 days ago.  She has started riding her bike again and wants to see if she can get an injection today to decrease her knee pain.  Pain is most prominent when she goes down steps.  Feels like it is in the area underneath the kneecap.  No locking or giving way. ?2.  Bilateral thumb joint pain over the last several months.  Intermittent.  Difficulty holding a Coke can in her right hand for any length of time because it hurts. ? ? ? ?OBJECTIVE: BP 123/71   Ht 5' (1.524 m)   Wt 104 lb (47.2 kg)   BMI 20.31 kg/m?   ?Physical Exam:  Vital signs are reviewed. ?GENERAL: Well-developed female no acute distress ?Knee: Left.  No Effusion.  Mild Crepitus.  Positive Patellar Grind Test.  Some Lateral Joint Line Tenderness.  Full Extension and Flexion and Ligamentously Intact Varus and Valgus Stress. ?THUMBS: Both CMC and CMC Joints Are Moderately Tender to Palpation and Movement on Examination.  She Has Some Synovial Hypertrophy Bilateral CMC Joints.  No Erythema. ? ?PROCEDURE: INJECTION: ?Patient was given informed consent, signed copy in the chart. Appropriate time out was taken. Area prepped and draped in usual sterile fashion. Ethyl chloride was  used for local anesthesia. A 21 gauge 1 1/2 inch needle was used..  1 cc of methylprednisolone 40 mg/ml plus 4 cc of 1% lidocaine without epinephrine was injected into the left knee using a(n) anterior medial approach.  ? ?The patient tolerated the procedure well. There were no complications. Post procedure instructions were given. ? ? ?ASSESSMENT & PLAN: ? ?See problem based charting & AVS for pt instructions. 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  We discussed.  We will try over-the-counter Voltaren gel/Aspercreme.  Also  discussed using thumb sleeve and gave her information on that.  Should it become significantly worse and she wants to consider injection, she will follow-up. ? ?Arthritis of left knee ?Tremor exam it seems to mostly located in the patellofemoral joint.  Reviewed x-rays but there greater than 64 years old and she had some minimal to moderate joint space narrowing.  Try corticosteroid injection today.  If it does not help, I think I would get a CT scan to look at the underside part of her patella.  I think this is where her issues are.  She will let me know. ? ?

## 2021-06-01 NOTE — Assessment & Plan Note (Signed)
Tremor exam it seems to mostly located in the patellofemoral joint.  Reviewed x-rays but there greater than 64 years old and she had some minimal to moderate joint space narrowing.  Try corticosteroid injection today.  If it does not help, I think I would get a CT scan to look at the underside part of her patella.  I think this is where her issues are.  She will let me know. ?

## 2021-06-04 ENCOUNTER — Ambulatory Visit: Payer: Medicare Other

## 2021-06-04 ENCOUNTER — Ambulatory Visit
Admission: RE | Admit: 2021-06-04 | Discharge: 2021-06-04 | Disposition: A | Discharge status: Home / Self Care | Payer: Medicare Other | Source: Ambulatory Visit | Attending: Family Medicine | Admitting: Family Medicine

## 2021-06-04 DIAGNOSIS — N644 Mastodynia: Secondary | ICD-10-CM

## 2021-06-18 ENCOUNTER — Telehealth: Payer: Self-pay

## 2021-06-18 DIAGNOSIS — M1712 Unilateral primary osteoarthritis, left knee: Secondary | ICD-10-CM

## 2021-06-18 NOTE — Telephone Encounter (Signed)
-----   Message from Carolyne Littles sent at 06/18/2021  8:07 AM EDT ----- ?Regarding: vm message ?Pt left vm over weekend stating she is still having L knee pain even after injection. Wants to discuss maybe getting a CT scan.  ? ?

## 2021-06-19 ENCOUNTER — Ambulatory Visit (INDEPENDENT_AMBULATORY_CARE_PROVIDER_SITE_OTHER): Payer: Medicare Other | Admitting: Family Medicine

## 2021-06-19 DIAGNOSIS — F5000 Anorexia nervosa, unspecified: Secondary | ICD-10-CM | POA: Diagnosis not present

## 2021-06-19 NOTE — Patient Instructions (Signed)
-   For vegetarian frozen meal options, look in the vegetarian/special diet section of frozen foods.   ? ?- I strongly encourage you to start getting more whole, real foods in your diet vs. Relying on protein drinks twice a day.   ? ?You are still not eating enough, and at least some of your recent symptoms (light-headedness, swelling in hands and wrists, high BP and low HR) may be related to malnutrition.  Based on today's diet history, you are consistently under-eating fruits, vegetables, protein, calories, and numerous micronutrients (vitamins and minerals).   ? ?Goals:   ?1. At least 3 REAL MEALS per day.  A real breakfast includes a source of protein and a starchy food.  A real lunch or dinner includes protein, carb, and fruit and/or vegetable.   ?    Keep in mind your total daily protein goal is at least 60 grams.  (Continue to experiment with frozen meals.)  ?     - Breakfast:  Yogurt, cereal, nut butter, fresh fruit or frozen blueberries.   ?     - Lunch: Potato with broc & cheese; PB sandwich with fruit and/or veg; veggie burger on bun with salad or other veg; 1 cup beans, 1 cup rice, and vegetables.   ?2. Include at least 2 Ensures per day, plus at least 2 snacks per day (3 is better).   ?    - Snacks:  Any fruit, apple sauce, string cheese, yogurt, 1/2 peanut butter sandwich (2 tbsp peanut butter), cereal with yogurt, crackers with cheese or peanut butter.   ?    - Ensures: 16 g of protein is usually going to be sufficient.  It will be better to get the Ensure with this amt of protein and 350 calories vs. Ensure Max Protein.  Use the high-protein Ensures for snacks rather than at meals.   ?3. Set aside a few minutes each week to plan foods for the upcoming week, writing out a shopping list as you do this.   ? ?- Your homework assignment:  ?    Complete the Meal Planning form provided today.  The objective is to have meals that are all relatively quick and easy to prepare.  Use this form as a basis for  shopping so you have ingredients on hand for any of the meals.   ? ?- Continue to schedule meals in your phone with an alarm on them.   ? ?- When you see Josph Macho, if he does labs, be sure to ask him to also do a urinalysis so you can check specific gravity. ?  ?Follow-up appt on Tuesday, June 6 at 3:30 PM.  ?  ?

## 2021-06-19 NOTE — Progress Notes (Signed)
Medical Nutrition Therapy (MNT)  ?PCP: Kathryne Eriksson, MD ?Neurologist: Margette Fast, MD ?Gastroenterologist: Silvano Rusk, MD ?Therapist: Rea College, APRN, MSN, CS  ?Psychiatrist: [name], MD ? ?Appt start time: 1100 end time: 1200 (1 hour) ? 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 ? ?Relevant history/background: Vannary was hospitalized 2/27 to 04/16/21 for malnutrition, including low K+ and weight loss.  While hospitalized, she ate only part of meals presented, and to consume 1-2 Ensure drinks per day.  Since being home, she has managed to eat more food, with pretty good consistency.  She said she realized that her medical team had worked so hard to provide help, she felt she needed to make her own strong effort.   ? ?Assessment: Dorris's Na level (125-127) and HR has remained low (49-70, usually 50's) since hospitalization in March, and recently BP has been fluctuating, SBP ranging from low-100s to 140's and DBP, 69-94.  She has been having some swelling in hands and wrists.   ?Dietary recall suggests Lorina is restricting again, which she admits is related to distress at too rapid a weight gain.  She has been weighing at home, and saw a 3-4-lb gain in a week.   ? ?Weight: Did not weigh today per pt request.  (Weight was 105.2  lb on 05/21/21 and 126 lb on 08/16/20.)  Ht is 60".  ?Social: Has gotten together formally with neighbors another couple of times.    ?Usual eating pattern: 2-3 meals and 2 snacks per day, including Ensures.   ?Physical activity: None currently.  ?Sleep: Estimates at least 5 hrs sleep/night.  Bedtime at 9 PM, but awake till midnight, but sleeping usually until 5 of 5:30.   ?24-hr recall suggests intake of 1110 kcal:  ?(Up at 5 AM) ?B (6 AM)-  11 oz Pure Protein drink (30 g pro, 140 kcal)     140 ?Snk (7 AM)-  2 graham cracker rectangles, decaf coffee, 1 tbsp 2% milk, Sweet 'n Low 140 ?Snk (10:30)-  1/2 c  blueberries          40 ?L (12:15 PM)-  10 saltines, 3 tbsp peanut butter, water     370 ?Snk (2:20 PM) 11 oz Pure Protein drink (30 g pro, 140 kcal)     140 ?D (5:30 PM)-  Michelina's fettuccini alfredo (9 g protein, 280 kcal), water   280 ?Snk ( PM)-  water ?Typical day? No.  Usually has sandwich for lunch, and dinner is usually canned soup and saltines.   ? ?Previous kcal intake estimates: ?05/21/21:  1310 ?04/23/21:  1340  ?01/22/21:   720 ?11/17:   1160 ?08/29/20:    870  ?07/31/20:   875  ?06/29/20:    850  ?04/20/20:    700 ?03/23/20:             650 ?02/21/20   625 ?01/17/20   570 ?12/13/19   500 ? ?Intervention: Reviewed recent diet and exercise history, and discussed reluctance to gain weight, which is impairing patient's ability to make appropriate food choices. ? ?For recommendations and goals, see Patient Instructions.   ? ?Follow-up: Follow-up appt in 4 weeks.  ? ?Lecretia Buczek,[name]  ? ?

## 2021-06-22 ENCOUNTER — Other Ambulatory Visit: Payer: Medicare Other

## 2021-06-22 DIAGNOSIS — B9681 Helicobacter pylori [H. pylori] as the cause of diseases classified elsewhere: Secondary | ICD-10-CM

## 2021-06-24 LAB — H. PYLORI ANTIGEN, STOOL: H pylori Ag, Stl: NEGATIVE

## 2021-06-25 ENCOUNTER — Encounter: Payer: Self-pay | Admitting: Internal Medicine

## 2021-07-13 ENCOUNTER — Ambulatory Visit
Admission: RE | Admit: 2021-07-13 | Discharge: 2021-07-13 | Disposition: A | Discharge status: Home / Self Care | Payer: Medicare Other | Source: Ambulatory Visit | Attending: Family Medicine | Admitting: Family Medicine

## 2021-07-13 DIAGNOSIS — M1712 Unilateral primary osteoarthritis, left knee: Secondary | ICD-10-CM

## 2021-07-17 ENCOUNTER — Ambulatory Visit: Payer: Medicare Other | Admitting: Family Medicine

## 2021-07-18 ENCOUNTER — Ambulatory Visit
Admission: RE | Admit: 2021-07-18 | Discharge: 2021-07-18 | Disposition: A | Discharge status: Home / Self Care | Payer: Medicare Other | Source: Ambulatory Visit | Attending: Family Medicine | Admitting: Family Medicine

## 2021-07-18 ENCOUNTER — Ambulatory Visit (INDEPENDENT_AMBULATORY_CARE_PROVIDER_SITE_OTHER): Payer: Medicare Other | Admitting: Family Medicine

## 2021-07-18 DIAGNOSIS — F5000 Anorexia nervosa, unspecified: Secondary | ICD-10-CM | POA: Diagnosis not present

## 2021-07-18 DIAGNOSIS — M25561 Pain in right knee: Secondary | ICD-10-CM

## 2021-07-18 NOTE — Patient Instructions (Addendum)
Your current diet is inadequate in protein, and does not provide nearly enough vegetables to be considered optimal.    A body weight goal is a good starting point, but is not in itself an adequate indicator of well-being.  If pushed to determine a goal weight for you, I would say at least 110 lb, but if symptoms such as light-headedness, low HR, and edema in hands persist at 100 lb or above, the weight goal becomes superfluous.  OUR MAIN FOCUS NEEDS TO BE ON YOUR FUNCTIONING AND SENSE OF WELL-BEING.    For now - and until you are adequately well nourished, I recommend limiting most physical activity.  It will not be contraindicated, however, to do some daily stretching and some types of yoga.  (I also recommend exploring and working on some breathing exercises.)  Goals remain the same:   1. At least 3 REAL MEALS per day.  A real breakfast includes a source of protein and a starchy food.  A real lunch or dinner includes protein, carb, and fruit and/or vegetable.       Keep in mind your total daily protein goal is at least 60 grams.  (Continue to experiment with frozen meals.)       - Breakfast:  Yogurt, cereal, nut butter, fresh fruit or frozen blueberries.        - Lunch: Potato with broc & cheese; PB sandwich with fruit and/or veg; veggie burger on bun with salad or other veg; 1 cup beans, 1 cup rice, and vegetables.   NOTE: Continue to schedule meals in your phone with an alarm on them.    2. Include at least 2 Ensures per day, plus at least 2 snacks per day.      - Snacks:  Any fruit, apple sauce, string cheese, yogurt, 1/2 peanut butter sandwich (2 tbsp peanut butter), cereal with yogurt, crackers with cheese or peanut butter.       - Ensures: 16 g of protein is usually going to be sufficient.  Look for a nutritional drink with at least 250 calories.    3. Set aside a few minutes each week to plan foods for the upcoming week, writing out a shopping list as you do this.     Follow-up appt on  Monday, July 3 at 11 AM (and Tuesday, September 12 at 11 AM).

## 2021-07-18 NOTE — Progress Notes (Signed)
Medical Nutrition Therapy (MNT)  PCP: Kathryne Eriksson, MD Neurologist: Margette Fast, MD Gastroenterologist: Silvano Rusk, MD Therapist: Rea College, APRN, MSN, CS  Psychiatrist: [name], MD  Appt start time: 1530 end time: 1630 (1 hour)  Reason for visit: Referred for Medical Nutrition Therapy related to anorexia nervosa, restricting type (F50.01).  Relevant history/background: Becky [name] was hospitalized 2/27 to 04/16/21 for malnutrition, including low K+ and weight loss.  While hospitalized, she ate only part of meals presented, and to consume 1-2 Ensure drinks per day.  Since being home, she has managed to eat more food, with pretty good consistency.  She said she realized that her medical team had worked so hard to provide help, she felt she needed to make her own strong effort.    Assessment: Becky [name] fell and injured her L ankle on June 1.  Diagnosis is avulsion fracture of the lateral talar process.  Also injured her R knee in the fall for which she will see Dr. Nori Riis on 07/20/21.  Becky [name] took Abilify & olanzapine for about a month, this drug combination intended to stimulate appetite.  She discontinued Abilify on 07/13/21 under Dr. Oretha Ellis supervision.  Becky [name] would like to be 104 lb, and Dr. Albertine Patricia would like for Becky [name] and me to agree to a reasonable goal weight.  I explained to Becky [name] why a goal weight is not necessarily indicative of nutritional status, and weight is only one factor in assessing diet adequacy.   Becky [name] saw her PCP Kathryne Eriksson, MD on 06/21/21.  He did no labs on that date, although Becky [name] had wanted to see if Na level had normalized.  Her HR is now usually <60; SBP has been 130s to 140s, and DBP has been 80s to >100.  Swelling in hands and wrists is ongoing.  All of the above, together with daily light-headedness is concerning as possibly indicating malnutrition despite weight gain.    Weight: Did not weigh today as pt is in an orthopedic boot.  (Weight was  109.1 lb on 06/21/21 and 126 lb on 08/16/20.)  Ht is 60".  Usual eating pattern: 2 meals and 2 snacks per day, including Ensures.   Physical activity: None currently.  Had walked twice with a friend up to 3 miles before ankle injury.   Light-headedness: Daily.  Seems worse in morning.   Swelling: Hands and wrists.   Sleep: Estimates 5 hrs sleep/night.  Bedtime at 10 PM, but still awake till midnight, and waking usually ~4 AM.   24-hr recall suggests intake of ~1370 kcal:  (Up at 4:15 AM)  B (6 AM)-  11 oz protein shake (24 g pro; 140 kcal), 5 oz fruit yogurt, 1/4 cantaloupe     300 Snk ( AM)-  --- L (11 AM)-  11 oz protein shake (24 g pro; 140 kcal), 5 oz fruit yogurt, 1 slc bread, 1 tbsp pimiento chs, water 410 Snk (1 PM)-  1 banana             120 Snk (4 PM)-  11 oz protein shake             140 D (6 PM)-  2 c meat-free chili, 1 slc bread, water          400 Snk ( PM)-  --- Typical day? No. She is eating less since going off Abilify last week.    Previous kcal intake estimates: 06/19/21:  1110  05/21/21:  1310 04/23/21:  1340  01/22/21:   720 11/17:  1160 08/29/20:    870  07/31/20:   875  06/29/20:    850  04/20/20:    700 03/23/20:             650 02/21/20   625 01/17/20   570 12/13/19   500  Intervention: Reviewed recent diet and exercise history, and confirmed previous behavioral goals.  For recommendations and goals, see Patient Instructions.    Follow-up: Follow-up appt in 4 weeks.   Becky [name],Becky [name]

## 2021-07-19 ENCOUNTER — Telehealth: Payer: Self-pay | Admitting: Family Medicine

## 2021-07-19 NOTE — Telephone Encounter (Signed)
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 I think this explains her significant pain and the fact that injection therapy has not been as successful as we hoped. I would recommend eval by ortho--perhaps there is intervention available (other than major knee surgery such as replacement although TKR maybe needed). I will try to call back tomorrow and or she can leave me message if this is route she wishes to go. Dorcas Mcmurray

## 2021-07-20 ENCOUNTER — Ambulatory Visit (INDEPENDENT_AMBULATORY_CARE_PROVIDER_SITE_OTHER): Payer: Medicare Other | Admitting: Family Medicine

## 2021-07-20 ENCOUNTER — Encounter: Payer: Self-pay | Admitting: Family Medicine

## 2021-07-20 VITALS — BP 122/78 | Ht 60.0 in | Wt 115.0 lb

## 2021-07-20 DIAGNOSIS — M25561 Pain in right knee: Secondary | ICD-10-CM

## 2021-07-20 DIAGNOSIS — M1712 Unilateral primary osteoarthritis, left knee: Secondary | ICD-10-CM

## 2021-07-20 DIAGNOSIS — S82892A Other fracture of left lower leg, initial encounter for closed fracture: Secondary | ICD-10-CM

## 2021-07-20 MED ORDER — TRAMADOL HCL 50 MG PO TABS: 50.0000 mg | ORAL_TABLET | Freq: Three times a day (TID) | ORAL | 0 refills | Status: DC | PRN

## 2021-07-20 MED ORDER — METHYLPREDNISOLONE ACETATE 40 MG/ML IJ SUSP
40.0000 mg | Freq: Once | INTRAMUSCULAR | Status: AC
  Administered 2021-07-20: 40 mg via INTRA_ARTICULAR

## 2021-07-20 NOTE — Progress Notes (Unsigned)
Becky [name] - 66 y.o. female MRN [id number]  Date of birth: 04/19/1955    SUBJECTIVE:      Chief Complaint:/ HPI:  #1.  Left ankle fracture.  Was seen at urgent care but they have not set her up with orthopedics.  She continues in the walker boot.  She is having some significant pain and has been using ibuprofen. 2.  Right knee pain.  She has been favoring the left leg because of the ankle and the right knee is really giving her problems.  It is her typical arthritis pain that she usually has in the left knee but it is now in the right.  Worse with stairs.  She did fall right onto the patella. 3.  Follow-up 4 chronic left knee pain.  CT scan has been done.  The left knee pain is a little worse than baseline secondary to fall.   IMAGING: outside x ray report, film not viewable:1.  Acute minimally displaced avulsion fracture of the lateral talar process.  2.  Fragmentation about the dorsal talar head represent additional minimally displaced avulsion fracture. Recommend correlation with point tenderness.  3.  Soft tissue swelling about the ankle, most conspicuously about the lateral malleolus.  4.  Joint spaces are maintained.  PERTINENT  PMH / PSH: I have reviewed the patient's medications, allergies, past medical and surgical history, smoking status.  Pertinent findings that relate to today's visit / issues include: Patient Active Problem List   Diagnosis Date Noted   Generalized anxiety disorder     Priority: High   Depression 11/19/2018    Priority: High   Osteopenia determined by [facility] 04/2013    Priority: High   Anorexia nervosa 11/10/2007    Priority: High   Closed fracture of left ankle 07/21/2021    Priority: Medium    Arthritis of left knee 06/01/2021    Priority: Medium    Protein-calorie malnutrition, severe 04/10/2021    Priority: Medium     04/09/2021       HTN (hypertension) 06/15/2014    Priority: Medium    Orthostatic hypotension 05/17/2008    Priority:  Medium    Chronic gastritis 04/25/2006    Priority: Medium    Gastroparesis 04/25/2006    Priority: Medium    Arthritis of carpometacarpal (CMC) joints of both thumbs 06/01/2021    Priority: Low   Excessive weight loss     Priority: Low   Knee pain, right 11/19/2014    Priority: Low        12/06/2020    10/06/2020    08/05/2019    08/05/2019            09/10/2017    05/21/2016    12/07/2013    01/15/2011    02/23/2007   Current Outpatient Medications on File Prior to Visit  Medication Sig Dispense Refill         albuterol (PROAIR HFA) 108 (90 BASE) MCG/ACT inhaler INHALE 2 PUFFS INTO THE LUNGS EVERY 6 HOURS AS NEEDED (Patient taking differently: Inhale 2 puffs into the lungs every 6 (six) hours as needed for shortness of breath or wheezing.) 1 Inhaler 6   benztropine (COGENTIN) 0.5 MG tablet Take 0.5 mg by mouth 2 (two) times daily.     Cyanocobalamin (VITAMIN B-12 PO) Take 1 tablet by mouth daily.     DULoxetine (CYMBALTA) 60 MG capsule Take 120 mg by mouth daily.           feeding supplement (ENSURE  ENLIVE / ENSURE PLUS) LIQD Take 237 mLs by mouth 3 (three) times daily between meals. 237 mL 12   hydrochlorothiazide (MICROZIDE) 12.5 MG capsule Take 12.5 mg by mouth daily.        0   Melatonin 10 MG CAPS Take 10 mg by mouth at bedtime.        0   nebivolol (BYSTOLIC) 10 MG tablet Take 5 mg by mouth daily.     OLANZapine (ZYPREXA) 5 MG tablet Take 1 tablet (5 mg total) by mouth at bedtime. 30 tablet 0   omeprazole (PRILOSEC) 40 MG capsule Take 40 mg by mouth daily.     thiamine 100 MG tablet Take 1 tablet (100 mg total) by mouth daily. 30 tablet 0   traZODone (DESYREL) 100 MG tablet Take 200 mg by mouth at bedtime.     VITAMIN D PO Take 1 tablet by mouth daily.     Current Facility-Administered Medications on File Prior to Visit  Medication Dose Route Frequency Provider Last Rate Last Admin   methylPREDNISolone acetate (DEPO-MEDROL) injection 40 mg  40 mg Intra-articular  Once Dickie La, MD          OBJECTIVE: BP 122/78   Ht 5' (1.524 m)   Wt 115 lb (52.2 kg)   BMI 22.46 kg/m   Physical Exam:  Vital signs are reviewed. GENERAL: Well-developed female no acute distress KNEE: Right.  Full range of motion flexion extension.  Medial joint line tenderness.  No effusion.  Ligamentously intact to varus and valgus stress. Skin: Skin around the area of the right knee is without any sign of rash, no unusual warmth or erythema.  Popliteal space is benign. ANKLE: Left: Some soft tissue swelling.  Tender to palpation over the ATF area and laterally.  No significant bruising.  She has intact dorsiflexion and plantarflexion.  Dorsalis pedis pulses are 2+.  Sensation is intact.  PROCEDURE: INJECTION: Patient was given informed consent, signed copy in the chart. Appropriate time out was taken. Area prepped and draped in usual sterile fashion. Ethyl chloride was  used for local anesthesia. A 21 gauge 1 1/2 inch needle was used..  1 cc of methylprednisolone 40 mg/ml plus 4 cc of 1% lidocaine without epinephrine was injected into the right knee joint using a(n) anterior medial approach.   The patient tolerated the procedure well. There were no complications. Post procedure instructions were given.  IMAGING: I reviewed the report from her ankle films but I cannot see the actual images.  ASSESSMENT & PLAN:  See problem based charting & AVS for pt instructions. 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  This will need to be followed up by orthopedics at some point but we will do with her ankle fracture and other issues today.  I will see her back for this in 4 weeks.  Knee pain, right She probably has some underlying degenerative arthritis in this knee as well and landing directly on her patella and her fall probably aggravated this.  We discussed options and decided to proceed with corticosteroid injection today.   Follow-up 4 weeks.  Closed fracture of left ankle I cannot see the films.  She has low bone density and I would prefer her to be evaluated by foot and ankle specialist to ensure adequate treatment of this ankle fracture so we have set her up to see Dr. Lucia Gaskins in a few days.  She will continue fracture walker boot  until then.  I recommended stopping the ibuprofen if she can and just using some tramadol for pain relief.  She can use ibuprofen for breakthrough if she needs it but I think she will need all the help she can get with bone healing.  She is continue on calcium and vitamin D.

## 2021-07-20 NOTE — Patient Instructions (Addendum)
I am sending you to Dr. Lucia Gaskins for an evaluation of your ankle.  I cannot see the films that were done and I am sure he will do his own films.  Until then, continue the boot that you were in.  Regarding the right knee pain, I gave you corticosteroid injection in that knee today.  Let me know if you have any problems or if it does not improve over the next 1 to 2 weeks.  Regarding the left knee pain that we do start evaluating a several weeks ago, the CT scan does show significant joint space narrowing and arthritis underneath the kneecap.  My original thought had been to send you to orthopedics for evaluation of possible procedures for that.  I think the ankle fracture right now is more pressing so we will circle back around to this when I see you back in 3 to 4 weeks.  Please call in the interim if you have any new or worsening symptoms.  Please continue your vitamin D and calcium.  Great to see you!  Dr Radene Journey [facility] [address] [place] [phone] Wednesday June 14th @ [place] [employer] insurance card and Management consultant

## 2021-07-21 DIAGNOSIS — S82892A Other fracture of left lower leg, initial encounter for closed fracture: Secondary | ICD-10-CM | POA: Insufficient documentation

## 2021-07-21 NOTE — Assessment & Plan Note (Signed)
She probably has some underlying degenerative arthritis in this knee as well and landing directly on her patella and her fall probably aggravated this.  We discussed options and decided to proceed with corticosteroid injection today.  Follow-up 4 weeks.

## 2021-07-21 NOTE — Assessment & Plan Note (Signed)
I cannot see the films.  She has low bone density and I would prefer her to be evaluated by foot and ankle specialist to ensure adequate treatment of this ankle fracture so we have set her up to see Dr. Lucia Gaskins in a few days.  She will continue fracture walker boot until then.  I recommended stopping the ibuprofen if she can and just using some tramadol for pain relief.  She can use ibuprofen for breakthrough if she needs it but I think she will need all the help she can get with bone healing.  She is continue on calcium and vitamin D.

## 2021-07-21 NOTE — Assessment & Plan Note (Signed)
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  This will need to be followed up by orthopedics at some point but we will do with her ankle fracture and other issues today.  I will see her back for this in 4 weeks.

## 2021-08-01 ENCOUNTER — Ambulatory Visit: Payer: Medicare Other | Admitting: Family Medicine

## 2021-08-08 ENCOUNTER — Ambulatory Visit (INDEPENDENT_AMBULATORY_CARE_PROVIDER_SITE_OTHER): Payer: Medicare Other | Admitting: Family Medicine

## 2021-08-08 ENCOUNTER — Ambulatory Visit: Payer: Medicare Other | Admitting: Family Medicine

## 2021-08-08 VITALS — BP 136/81 | Ht 60.0 in | Wt 115.0 lb

## 2021-08-08 DIAGNOSIS — S82892D Other fracture of left lower leg, subsequent encounter for closed fracture with routine healing: Secondary | ICD-10-CM | POA: Diagnosis not present

## 2021-08-08 DIAGNOSIS — M2041 Other hammer toe(s) (acquired), right foot: Secondary | ICD-10-CM

## 2021-08-08 NOTE — Progress Notes (Signed)
   Becky [name] is a 66 y.o. female who presents to [facility] today for the following:  Follow-up for left ankle fracture.  Following with Dr. Lucia Gaskins with ortho foot/ankle, saw them earlier today and air cast was removed. Having some soreness still but overall reports improvement in pain over the past month. Using ibuprofen/tylenol as needed with good effect. Pain in right knee has also improved and doing well. No concerns with knees. Does report some pain with a hammertoe on her left 2nd digit; history of surgical pin in the 2nd toe. Pain is not acute, but curious if anything may be able to help with the pain.  PMH reviewed. ROS as above. Medications reviewed.  Exam:  BP 136/81   Ht 5' (1.524 m)   Wt 115 lb (52.2 kg)   BMI 22.46 kg/m  Gen: Well NAD MSK: Left Ankle: - Inspection: No obvious deformity, erythema, swelling, or ecchymosis, ulcers, calluses, blisters b/l - Palpation: No TTP at MT heads, no TTP at base of 5th MT, no TTP over cuboid, mild tenderness over navicular prominence, no TTP over lateral or medial malleolus.  No sign of peroneal tendon subluxation or TTP. - Strength: Normal strength with dorsiflexion, plantarflexion, inversion, and eversion of foot; flexion and extension of toes b/l - ROM: Full ROM b/l - Neuro/vasc: NV intact distally bilaterally - Special Tests: Negative anterior drawer, normal inversion test.  Negative syndesmotic compression.  Feet: Hammertoe defect noted on right 2nd toe with decreased ROM secondary to surgical pin. No edema, effusion, swelling, ecchymosis, ulceration, or lesion. No TTP of the left foot or toes.  Normal calcaneal motion with toe-raise b/l.      Assessment and Plan: 1) Left ankle fracture - Air cast d/c'd earlier today by Dr. Lucia Gaskins - Following with Dr. Lucia Gaskins, continue to follow up as scheduled - Continue ankle strengthening exercises and stretches - Can continue tylenol/ibuprofen as needed for pain and discomfort - Can  advance exercise and increase walking as tolerated, will use pain as guide  2) Right 2nd toe hammertoe - Hammertoe pad provided today - Continue proper footwear - Follow up with foot/ankle surgery considering history of pin placement in toe - Can consider possible orthotics if no improvement - Follow up as needed   Clyde Lundborg, MD FM PGY-2  Sports Medicine Fellow Addendum   I have independently interviewed and examined the patient. I have discussed the above with the original author and agree with their documentation. My edits for correction/addition/clarification have been made.    [place] Constable, D.O. PGY-4, [facility] 04/18/2021 3:07 PM    Addendum:  I was the preceptor for this visit and available for immediate consultation.  Karlton Lemon MD Kirt Boys

## 2021-08-13 ENCOUNTER — Ambulatory Visit (INDEPENDENT_AMBULATORY_CARE_PROVIDER_SITE_OTHER): Payer: Medicare Other | Admitting: Family Medicine

## 2021-08-13 DIAGNOSIS — F5000 Anorexia nervosa, unspecified: Secondary | ICD-10-CM | POA: Diagnosis not present

## 2021-08-13 NOTE — Patient Instructions (Addendum)
Goals remain the same:   1. At least 3 REAL MEALS per day.  A real breakfast includes a source of protein and a starchy food.  A real lunch or dinner includes protein, carb, and fruit and/or vegetable.       Keep in mind your total daily protein goal is at least 60 grams.  (Continue to experiment with frozen meals.)       - Breakfast:  Yogurt, cereal, nut butter, fresh fruit or frozen blueberries.        - Lunch: Potato with broc & cheese; PB sandwich with fruit and/or veg; veggie burger on bun with salad or other veg; 1 cup beans, 1 cup rice, and vegetables.    2. Include vegetables in at least 7 meals per week.       Ways to include veg's:      Cooked: spinach, (other greens), zucchini, yellow squash, onions, carrots, green peas.      Raw: tomatoes, bell pepper, mushrooms, cucumber, peas.       Gazpacho or pureed soups.  3. Set aside a few minutes each week to plan foods for the upcoming week, writing out a shopping list as you do this.     Follow-up appt on September 12 at 11 AM.

## 2021-08-13 NOTE — Progress Notes (Signed)
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  Relevant history/background: Becky [name] was discharged from hospitalization for malnutrition on 04/16/21.  Since being home, she has eaten significantly more food than prior to hospitalization, although estimates of kcal intake suggest that intake is still not meeting nutritional needs.    Assessment: Caitlin no longer needs her orthopedic boot for the avulsion fracture of her left ankle, but she was diagnosed with neuropathy in both feet by Leonia Reader at Dr. Dois Davenport office for which she was prescribed rolling each foot on a tennis ball ~15 min/day.  Pinki has had some dental work done, but current dental problems continue to limit what she can eat.  She is still taking olanzapine, which she started in the hospital; has started to decrease it, and plans to d/c entirely b/c she feels it is mainly responsible for her weight gain.     Weight: 126.0 lb. (Weight was 115 lb on 08/08/21)  Ht is 60".  Usual eating pattern: 2 meals and 2 snacks per day, including Ensures.   Physical activity: Has been walking her dog with a neighbor 80-90 min 4 X wk.   Symptoms:  Light-headedness: Experiencing once daily for ~ 10 min, still usually ~mid-morning.   Swelling: hands daily.   BP: 130s to 140/81 often.  HR is back up from 50's to 60's to low-70's.   Sleep: Estimates 5-6 hrs sleep/night.  Bedtime at 10 PM, but still awake till midnight, and waking usually ~4 AM.   24-hr recall suggests intake of ~1340 kcal:  (Up at 5 AM) B (5:15 AM)-  5 oz flavored Grk yogurt, 11 oz protein drink (10 g protein, 190 kcal),  water  290  - Felt light-headed around 10 AM -  Snk (11:30)-  16 oz water L (2 PM)-  2 c roasted red potatoes, garlic sauce, 11 oz protein drink (10 g protein, 190 kcal)  660 Snk ( PM)-  --- D (6:15 PM)-  1 grilled chs sandwich, 11 oz protein drink (10 g protein, 190 kcal), water   390 Snk ( PM)-  --- Typical day? Yes.    Previous kcal intake estimates: 07/18/21:  1370  06/19/21:  1110  05/21/21:  1310 04/23/21:  1340  01/22/21:   720 11/17:   1160 08/29/20:    870  07/31/20:   875  06/29/20:    850  04/20/20:    700 03/23/20:             650 02/21/20   625 01/17/20   570 12/13/19   500  Intervention: Reviewed recent diet and exercise history, and discussed how Anwar's kcal restriction related to her focus on weight loss contributes to her inadequate intake of kcal, protein, and other nutrients, and paradoxically probably makes weight loss less likely.  Discussed lack of fruit or vegetables yesterday, which was a normal day's intake for her.    For recommendations and goals, see Patient Instructions.    Follow-up: Follow-up appt in 10 weeks.   Makaylia Hewett,[name]

## 2021-10-23 ENCOUNTER — Ambulatory Visit (INDEPENDENT_AMBULATORY_CARE_PROVIDER_SITE_OTHER): Payer: Medicare Other | Admitting: Family Medicine

## 2021-10-23 DIAGNOSIS — F5000 Anorexia nervosa, unspecified: Secondary | ICD-10-CM | POA: Diagnosis not present

## 2021-10-23 NOTE — Patient Instructions (Addendum)
-   Design at least 3 meals you can make that include protein and vegetables.     - For example, butternut squash soup to which you add powdered milk and spinach.     - Consider also adding any beans/lentils/split peas to soups as a protein source.     - [employer] your list of meals and when you have tried at least one of them.    - Additional foods to keep on hand:   - Bread and peanut butter   - Eggs   - Cheese   - Canned beans   - Yogurt   - Fresh fruit   - Frozen veg's   - Oatmeal  (add per serving 1-2 tbsp peanut butter and/or 1/3 c milk powder)   Goals remain the same:   1. At least 2 REAL MEALS per day, increadingthis to 3 meals per day by 9/26.  A real breakfast includes a source of protein and a starchy food.  A real lunch or dinner includes protein, carb, and fruit and/or vegetable.       Keep in mind your total daily protein goal is at least 60 grams.  (Continue to experiment with frozen meals.)       - Breakfast:  Yogurt, cereal, nut butter, fresh fruit or frozen blueberries.        - Lunch: Potato with broc & cheese; PB sandwich with fruit and/or veg; veggie burger on bun with salad or other veg; 1 cup beans, 1 cup rice, and vegetables.   2. Include vegetables in at least 7 meals per week.       Ways to include veg's:      Cooked: spinach, (other greens), zucchini, yellow squash, onions, carrots, green peas.      Raw: tomatoes, bell pepper, mushrooms, cucumber, peas.       Gazpacho or pureed soups. 3. Set aside a few minutes each week to plan foods for the upcoming week, writing out a shopping list as you do this.    Document # of meals you get each day.  Average the # of daily meals you get at the end of each week.    Follow-up appt on Tuesday, Oct 10 at 11:30 AM.

## 2021-10-23 NOTE — Progress Notes (Signed)
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  Relevant history/background: Becky [name] was discharged from hospitalization for malnutrition on 04/16/21.  She continues to be preoccupied with weight loss, and is distressed at the weight she has gained in the past several months (<100 lb in March 2023 to 132.6 lb on 10/23/21).    Assessment: [name]'s food intake is minimal.  Typical pattern is to wake ~3 AM, have 1 Ensure, then go back to bed till 4 or 5 AM.  Then she doesn't eat again until a yogurt late-AM after walking the dog 90 minutes, followed by only water until ~5 PM when she has a "meal."  Protein foods and veg's have been limited.   She still has bilateral foot pain related to neuropathy.  She has been walking  regularly, but doesn't feel well after walking; feels exhausted.  Dental problems continue to limit what she can eat, e.g., raw veg's/salads are difficult.  She has gotten a bottom denture, and will complete dental work early next year.    Weight: 132.6 lb. (Weight was 126 lb on 7/323)  Ht is 60".  Usual eating pattern: 1-2 meals and 2 snacks per day, including Ensures.   Physical activity: Has been walking her dog ~90 min daily. Symptoms:  Light-headedness: Experiencing daily, usually ~mid-morning.  Also describes some episodes of vertigo the past several days.   Swelling: hands daily; hands start hurting while walking.     BP: Has not been checking at home.   FBG: Was high (138) when had cataract surgery on Aug 18.   HR: Has not checked, but has felt normal.     Sleep: Estimates 2-5 hrs sleep/night.  Not napping during the day.    Cognitive clarity: Has not noticed any decline, but said it takes her 2 hours to determine a grocery list.    24-hr recall suggests intake of <400 kcal:  (Up at 3 AM, went back to bed till 4 AM; had 1 Ensure Max Protein [30 g protein] at 3 AM) B ( AM)-   - Walked from 8:30 to 10 AM; drank 16 oz water -  Snk (10:30)-  1 c sugar-free Jello-O  (10 kcal) L ( PM)-  8 oz water Snk ( PM)-  --- D (5 PM)-  2 c yellow squash casserole (1/4 of onion, squash, 3 eggs, 2 slc bread 2 c low-fat cheddar), 16 oz water (350-400) Snk ( PM)-  --- Typical day? No.  Usually has yogurt in AM and fruit ~8 PM.    Previous kcal intake estimates: 08/13/21:  1340  07/18/21:  1370  06/19/21:  1110  05/21/21:  1310 04/23/21:  1340  01/22/21:   720 11/17:   1160 08/29/20:    870  07/31/20:   875  06/29/20:    850  04/20/20:    700 03/23/20:             650 02/21/20   625 01/17/20   570 12/13/19   500  Intervention: Reviewed recent diet and exercise history, and again reviewed how Shaquayla's kcal restriction related to her focus on weight loss contributes to her inadequate intake of kcal, protein, and other nutrients, and paradoxically  probably makes weight loss less likely.    For recommendations and goals, see Patient Instructions.    Follow-up: Follow-up appt in 4 weeks.   Becky [name],[name]

## 2021-11-20 ENCOUNTER — Encounter: Payer: Self-pay | Admitting: Family Medicine

## 2021-11-20 ENCOUNTER — Ambulatory Visit (INDEPENDENT_AMBULATORY_CARE_PROVIDER_SITE_OTHER): Payer: Medicare Other | Admitting: Family Medicine

## 2021-11-20 DIAGNOSIS — F5000 Anorexia nervosa, unspecified: Secondary | ICD-10-CM | POA: Diagnosis not present

## 2021-11-20 NOTE — Progress Notes (Signed)
Medical Nutrition Therapy (MNT)  PCP: Kathryne Eriksson, MD, [place], [facility] Neurologist: Margette Fast, MD Gastroenterologist: Silvano Rusk, MD Therapist: [employer], APRN, MSN, CS  Psychiatrist: [name], MD  Appt start time: 1130 end time: 1230 (1 hour)  Reason for visit: Referred for Medical Nutrition Therapy related to anorexia nervosa, restricting type (F50.01).  Relevant history/background: Becky [name] was discharged from hospitalization for malnutrition on 04/16/21.  She continues to be preoccupied with weight loss, and is distressed at the weight she has gained in the past several months (<100 lb in March 2023 to 132.6 lb on 10/23/21).    Assessment: Becky [name] has increased intake, but continues to fret about weight gain (has not measured weight, but is aware she is gaining).  She has made 4 recipes, including quiche, chili, green bean casserole, and squash casserole, which she has enjoyed, but specifically asked about how she can reduce her carb intake.  She continues to use the [employer], which allows her $40 per month for produce.  Blind weight: 141.4 lb. (132.6 lb on 10/23/21)  Ht is 60".  Usual eating pattern: 3 meals and 2 snacks per day, including at least 1 balanced meal per day.   Physical activity: Has been walking her dog ~90 min at least 6 days a week. Symptoms:  Light-headedness: Experiencing less often, but still 1-2 X day (vs. >5/day previously). No recent vertigo.   Swelling: Hands daily; wonders if it's arthritis-related.     BP & FBG: Has not been checking at home.     Sleep: Improved, estimates up to 6 hrs sleep/night.  Has discontinued benztropine and 10 mg Melatonin.  Takes 3 mg Lunesta ~10:30 PM.   Cognitive problems: Denies.  24-hr recall suggests intake of ~1560 kcal:  (Up at 5 AM) B (5 AM)-  11 oz Ensure Max Pro (30 g, 150 kcal), 1 pb sandwich (2 tbsp) 340 Walked the dog with 83-YO friend 9-10:30 AM Snk (10:30)-  5 oz plain yogurt,  water      100 L (2 PM)-  1 cheese enchilada (flour tortilla), water    450 Snk (4 PM)-  1 c sugar-free Jell-O pudding, water       60 D (7 PM)-  1 [employer] patty (270), 1 c green bean casserole (220), 1 c canned pineapple chunks, water Snk ( PM)-  ---         610 Typical day? No.  Normally does not eat out, but friend took her to lunch.  Typical lunch is pb sandwich or yogurt.     Previous kcal intake estimates: 10/23/21: <400 08/13/21:  1340  07/18/21:  1370  06/19/21:  1110  05/21/21:  1310 04/23/21:  1340  01/22/21:   720 11/17:   1160 08/29/20:    870  07/31/20:   875  06/29/20:    850  04/20/20:    700 03/23/20:             650 02/21/20   625 01/17/20   570 12/13/19   500  Intervention: Reviewed recent diet and exercise history, and again reviewed how Becky [name]'s kcal restriction related to her focus on weight loss contributes to her inadequate intake of kcal, protein, and other nutrients, and paradoxically probably makes weight loss less likely.    For recommendations and goals, see Patient Instructions.    Follow-up: Follow-up appt in 4 weeks.   Narda Fundora,[name]

## 2021-11-20 NOTE — Patient Instructions (Signed)
-   Start checking your fasting blood glucose at least 3 times a week.   - Your concern about carb's (want to limit):     - Use as many whole, real foods as possible, including FRESH or frozen (vs canned) fruits and vegetables.    - You may want to try some high-protein or low-carb breads/wraps, also aiming for whole-grain.    - Oatmeal:  Use cooked (best is long-cooking steel-cut).  You can add 1/3 c powdered milk for 8 g of protein.    - You can occasionally use fruit as your carb.    - Consider recipe ingredients, e.g., what can you substitute for onion rings?   Goals remain the same:   1. At least 2 REAL MEALS per day.  A real breakfast includes a source of protein and a starchy food.  A real lunch or dinner includes protein, carb, and fruit and/or vegetable.       Keep in mind your total daily protein goal is >60 grams.    2. Include vegetables in at least 10 meals per week.       Ways to include veg's:      Cooked: spinach, (other greens), zucchini, yellow squash, onions, carrots, green peas.      Raw: tomatoes, bell pepper, mushrooms, cucumber, peas.       Gazpacho or pureed soups.  3. Set aside a few minutes each week to plan foods for the upcoming week, writing out a shopping list as you do this.     Document progress on the Goals Sheet provided today.     Follow-up appt on Tuesday, Nov 7 at 11 AM.

## 2021-12-17 NOTE — Progress Notes (Unsigned)
Medical Nutrition Therapy (MNT)  PCP: Kathryne Eriksson, MD, [place] Neurologist: Margette Fast, MD Gastroenterologist: Silvano Rusk, MD Therapist: [employer]a [employer], APRN, MSN, CS  Psychiatrist: [name], MD  Appt start time: 1100 end time: 1200 (1 hour)  Reason for visit: Referred for Medical Nutrition Therapy related to anorexia nervosa, restricting type (F50.01).  Relevant history/background: Miguel was discharged from hospitalization for malnutrition on 04/16/21.  She continues to be preoccupied with weight loss, and is distressed at the weight she has gained in the past several months (<100 lb in March 2023).    Assessment: Yarethzy saw PA Lorenda Cahill on 12/04/21 for hand (and elbow and foot) pain, who diagnosed neuropathy and osteoarthritis in hands and feet.  She started on 200 mg Celebrex and 600 mg gabapentin.  Rheumatoid factor, CCP antibodies, and sed rate were within normal range, and antinuclear antibody (ANA) With HEp-2 substrate was negative.  A1c on that day, however, was 6.3%, up from 5.7% on 05/23/20, and BUN was elevated at 35 mg/dL (normal = 8-24).     She continues to use the [employer], which allows her $40 per month for produce; however, Zyaire is still not meeting her goal of veg's at least 10 X wk.  Blind weight: 140.2 lb. (141.4 lb on 11/20/21)  Ht is 60".  Usual eating pattern: 2 meals and 0-2 snacks per day, including at least 1-2 balanced meals per day.   Physical activity: Has been walking her dog ~90 min at least 4-5 days a week. Symptoms Light-headedness: Still 1-2 X day (not vertigo).   Swelling: Hands daily.  Pain not alleviated by Celebrex and gabapentin.   BP & FBG: Has not been checking at home; has no supplies at home.     Sleep: Has started using a FitBit, which estimates 4-5 hrs sleep/night.  Still taking 3 mg Lunesta ~10:30 PM.   Cognitive problems: Denies.  24-hr recall suggests intake of ~800  kcal:  (Up at 5 AM) B (5 AM)-  5 oz apple yogurt (80 kcal, 12 g pro), 1 c decaf, 2 pkts Equal, 2 tbsp 2% milk 100 Snk (9 AM)-  16 oz water L (11:30 AM)-  1 carb-free tortilla, 2 tbsp peanut butter, 16 oz water     225    Snk (1:30)-  1 c sugar-free Jell-O           10 Snk (3:30)-  16 oz water Snk (5 PM)-  8 oz water D (6:15 PM)-  2 c veg chili, 1/2 oz cheese, 1 c green bean casserole, 8 oz water   450 Snk (8 PM)-  1 c sugar-free Jell-O, 1 c decaf 2 pkts Equal, 2 tbsp 2% milk     25 Typical day? Yes.      Previous kcal intake estimates: 11/20/21:  1560  10/23/21: <400 08/13/21:  1340  07/18/21:  1370  06/19/21:  1110  05/21/21:  1310 04/23/21:  1340  01/22/21:   720 11/17:   1160 08/29/20:    870  07/31/20:   875  06/29/20:    850  04/20/20:    700 03/23/20:             650 02/21/20   625 01/17/20   570 12/13/19   500  Intervention: Reviewed recent diet and exercise history, and discussed appropriate restriction of carbohydrate in light of increased A1c.  Encouraged documenting progress on behavioral goals.    For recommendations and goals,  see Patient Instructions.    Follow-up: Follow-up appt in 4 weeks.   Raj Landress,[name]

## 2021-12-18 ENCOUNTER — Encounter: Payer: Self-pay | Admitting: Family Medicine

## 2021-12-18 ENCOUNTER — Ambulatory Visit (INDEPENDENT_AMBULATORY_CARE_PROVIDER_SITE_OTHER): Payer: Medicare Other | Admitting: Family Medicine

## 2021-12-18 VITALS — Ht 60.0 in | Wt 140.2 lb

## 2021-12-18 DIAGNOSIS — F5001 Anorexia nervosa, restricting type: Secondary | ICD-10-CM

## 2021-12-18 NOTE — Patient Instructions (Signed)
When your feelings are hurt by others' actions or words, do your best to not take it personally.   Coffee and use of sweeteners:  A more nutrient-dense coffee drink would be mixing your coffee with vanilla soy milk (and maybe some protein powder or just cinnamon).  Each cup of soy milk provides about 7 grams of protein.)    - Start checking your fasting blood glucose at least 3 times a week; get your supplies asap.   - Your concern about carb's (want to limit):               - Use as many whole, real foods as possible, including FRESH or frozen (vs canned) fruits and vegetables.               - You may want to try some high-protein or low-carb breads/wraps, also aiming for whole-grain.               - Oatmeal:  Use cooked (best is long-cooking steel-cut).  You can add 1/3 c powdered milk for 8 g of protein.               - You can occasionally use fruit as your carb.               - Consider recipe ingredients, e.g., what can you substitute for onion rings? - To help manage your blood sugar levels, a good rule of thumb is to limit carb portions to 2 per meal.   ONE carb portion is equal to the following:  - ONE slice of bread (or its equivalent, such as half of a hamburger bun).  - 1/2 cup of a "scoopable" starchy food such as potatoes or rice.  - 15 grams of Total Carbohydrate as shown on food label.   Goals remain the same:   1. At least 2 REAL MEALS per day.  A real breakfast includes a source of protein and a starchy food.  A real lunch or dinner includes protein, carb, and fruit and/or vegetable.       Keep in mind your total daily protein goal is >60 grams.    2. Include vegetables in at least 10 meals per week.       Ways to include veg's:      Cooked: spinach, (other greens), zucchini, yellow squash, onions, carrots, green peas.      Raw: tomatoes, bell pepper, mushrooms, cucumber, peas.       Gazpacho or pureed soups.  (If using canned soup, first microwave veg's, then add soup, and  reheat.)     Keep frozen vegetables on hand, which you can microwave if in a hurry, or prepare any other way.    3. Set aside a few minutes each week to plan foods for the upcoming week, writing out a shopping list as you do this.    Document progress on the Goals Sheet provided.  Place your Goals Sheet somewhere where you'll see it every day!  Follow-up appt on Tuesday, December 5 at 11 AM.

## 2021-12-19 ENCOUNTER — Ambulatory Visit: Payer: Medicare Other | Admitting: Family Medicine

## 2022-01-11 ENCOUNTER — Ambulatory Visit (INDEPENDENT_AMBULATORY_CARE_PROVIDER_SITE_OTHER): Payer: Medicare Other | Admitting: Family Medicine

## 2022-01-11 VITALS — BP 122/70 | Ht 60.0 in | Wt 140.0 lb

## 2022-01-11 DIAGNOSIS — M7711 Lateral epicondylitis, right elbow: Secondary | ICD-10-CM
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
## 2022-01-14 ENCOUNTER — Encounter: Payer: Self-pay | Admitting: Family Medicine

## 2022-01-14 NOTE — Progress Notes (Signed)
[facility] Attending Note: I have seen and examined this patient. I have discussed this patient with the resident and reviewed the assessment and plan as documented above. I agree with the resident's findings and plan.  Right forearm pain most consistent with (slightly atypical ) lateral epicondylitis. Pain is mostly in mid muscle belly with little ttp at origin ay lateral condyle so it is a little atypical.  HEP and counter point pressure bracing. F/u 3 weeks.   If not improved would consider [place] and or CSI

## 2022-01-14 NOTE — Progress Notes (Unsigned)
Medical Nutrition Therapy (MNT)  PCP: Kathryne Eriksson, MD, [place] Neurologist: Margette Fast, MD Gastroenterologist: Silvano Rusk, MD Therapist: Rea [employer], APRN, MSN, CS  Psychiatrist: [name], MD  Appt start time: 1100 end time: 1200 (1 hour)  Reason for visit: Referred for Medical Nutrition Therapy related to anorexia nervosa, restricting type (F50.01).  Relevant history/background: Becky [name] continues to be preoccupied with weight loss, and is distressed at the weight she has gained in the past several months (she was <100 lb in March 2023).    Assessment: Becky [name] is ***still taking 200 mg Celebrex and 600 mg gabapentin, which ***.  *** A1c on 12/04/21 was 6.3%, up from 5.7% on 05/23/20.     She continues to use the [employer], which allows her $40 per month for produce; however, Becky [name] is still not meeting her goal of veg's at least 10 X wk.  Blind weight: *** lb. (140.2 lb on 11/20/21)  Ht is 60".  Usual eating pattern: ***2 meals and 0-2 snacks per day, including at least 1-2 balanced meals per day.   Physical activity: Has been walking her dog ***90 min at least 4-5 days a week.  Sleep: FitBit estimates ***4-5 hrs sleep/night.  Still taking 3 mg Lunesta ~10:30 PM. Symptoms Light-headedness: *** 1-2 X day (not vertigo).   Swelling: ***Hands daily.  Pain not alleviated by Celebrex and gabapentin.   BP & FBG: *** not been checking at home.    Cognitive problems: Denies.  24-hr recall suggests intake of *** kcal:  (Up at  AM) B ( AM)-   Snk ( AM)-   L ( PM)-   Snk ( PM)-   D ( PM)-   Snk ( PM)-   Typical day? {yes no:[id number]}   Previous kcal intake estimates: 12/18/21:    800  11/20/21:  1560  10/23/21: <400 08/13/21:  1340  07/18/21:  1370  06/19/21:  1110  05/21/21:  1310 04/23/21:  1340  01/22/21:   720 11/17:   1160 08/29/20:    870  07/31/20:   875  06/29/20:    850  04/20/20:    700 03/23/20:             650 02/21/20    625 01/17/20   570 12/13/19      500  Intervention: Reviewed recent diet and exercise history, and ***.  Again encouraged documenting progress on behavioral goals.    For recommendations and goals, see Patient Instructions.    Follow-up: Follow-up appt in ***7 weeks.   Dagmar Adcox,[name]   From 11/7 *** - Check your fasting blood glucose at least 3 times a week.   - Your concern about carb's (want to limit):               - Use as many whole, real foods as possible, including FRESH or frozen (vs canned) fruits and vegetables.               - You may want to try some high-protein or low-carb breads/wraps (whole-grain).               - Oatmeal:  Use cooked (best is long-cooking steel-cut).  You can add 1/3 c powdered milk for 8 g of protein.               - Consider recipe ingredients, e.g., what can you substitute for onion rings? - To help manage your blood sugar levels, a good rule of  thumb is to limit carb portions to 2 per meal.   ONE carb portion is equal to:  - ONE slice of bread (or its equivalent, such as half of a hamburger bun).  - 1/2 cup of a "scoopable" starchy food such as potatoes or rice.  - 15 grams of Total Carbohydrate as shown on food label.  - You can occasionally use fruit as a carb source, but usually "carb" = bread/rice/pasta/potatoes/etc.    Goals remain the same:   1. At least 2 REAL MEALS per day.  A real breakfast includes a source of protein and a starchy food.  A real lunch or dinner includes protein, carb, and fruit and/or vegetable.       Keep in mind your total daily protein goal is >60 grams.   2. Include vegetables in at least 10 meals per week.       Ways to include veg's:      Cooked: spinach, (other greens), zucchini, yellow squash, onions, carrots, green peas.      Raw: tomatoes, bell pepper, mushrooms, cucumber, peas.       Gazpacho or pureed soups.  (If using canned soup, first microwave veg's, then add soup, and reheat.)     Keep frozen vegetables on  hand, which you can microwave if in a hurry, or prepare any other way.   3. Set aside a few minutes each week to plan foods for the upcoming week, writing out a shopping list as you do this.     Document progress on the Goals Sheet provided.  Keep your Goals Sheet where you'll see it every day!   Follow-up appt on Tuesday, January ***23 at 11 AM.

## 2022-01-15 ENCOUNTER — Ambulatory Visit (INDEPENDENT_AMBULATORY_CARE_PROVIDER_SITE_OTHER): Payer: Medicare Other | Admitting: Family Medicine

## 2022-01-15 ENCOUNTER — Encounter: Payer: Self-pay | Admitting: Family Medicine

## 2022-01-15 VITALS — Ht 60.0 in | Wt 140.6 lb

## 2022-01-15 DIAGNOSIS — F5000 Anorexia nervosa, unspecified: Secondary | ICD-10-CM | POA: Diagnosis not present

## 2022-01-15 NOTE — Patient Instructions (Signed)
Best weight management happens for most people with a nutritionally balanced diet that is adequate in protein as well as energy.  When you restrict excessively, the body can respond by lowering metabolic rate, and "hanging on" to all the calories it gets.  In this condition, it will be hard to lose more weight and easier to gain weight on even a small energy increase.    To change this outcome requires liberalizing energy intake, which essentially allows your body to start trusting again that it will get a consistent source of adequate energy.    (Remember our conversation about the jigsaw puzzle, and all the pieces needing to be in place before the final outcome emerges.)  - Check your fasting blood glucose at least 3 times a week.   - Your concern about carb's (want to limit):               - Use as many whole, real foods as possible, including FRESH or frozen (vs canned) fruits and vegetables.               - You may want to try some high-protein or low-carb breads/wraps (whole-grain).               - Oatmeal:  Use cooked (best is long-cooking steel-cut).  You can add 1/3 c powdered milk for 8 g of protein.               - Consider recipe ingredients, e.g., what can you substitute for onion rings?  Goals remain the same:   1. At least 3 REAL MEALS per day.  A real breakfast includes a source of protein and a starchy food.  A real lunch or dinner includes protein, carb, and fruit and/or vegetable.       Keep in mind your total daily protein goal is >60 grams.   2. Include at least a cup of vegetables in at least 10 meals per week.       Ways to include veg's:      Cooked: spinach, (other greens), zucchini, yellow squash, onions, carrots, green peas.      Raw: tomatoes, bell pepper, mushrooms, cucumber, peas.       Gazpacho or pureed soups.  (If using canned soup, first microwave veg's, then add soup, and reheat.)     Keep frozen vegetables on hand, which you can microwave if in a hurry, or  prepare any other way.   3. Set aside a few minutes each week to plan foods for the upcoming week, writing out a shopping list as you do this.    Document progress on the Goals Sheet provided.  Keep Goals Sheet where you'll see it every day!  Follow-up appt on Tuesday, January 16 at 9:30 AM.

## 2022-01-25 ENCOUNTER — Ambulatory Visit (INDEPENDENT_AMBULATORY_CARE_PROVIDER_SITE_OTHER): Payer: Medicare Other | Admitting: Family Medicine

## 2022-01-25 VITALS — BP 126/78 | Ht 60.0 in | Wt 138.0 lb

## 2022-01-25 DIAGNOSIS — M7711 Lateral epicondylitis, right elbow: Secondary | ICD-10-CM | POA: Diagnosis not present

## 2022-01-25 NOTE — Progress Notes (Signed)
[facility] Attending Note: I have seen and examined this patient. I have discussed this patient with the resident and reviewed the assessment and plan as documented above. I agree with the resident's findings and plan.  
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
## 2022-02-25 NOTE — Progress Notes (Unsigned)
Medical Nutrition Therapy (MNT)  PCP: Kathryne Eriksson, MD, [place] Neurologist: Margette Fast, MD Gastroenterologist: Silvano Rusk, MD Therapist: Rea College, APRN, MSN, CS  Psychiatrist: [name], MD  Appt start time: 0930 end time: 1030 (1 hour)  Reason for visit: Referred for Medical Nutrition Therapy related to anorexia nervosa, restricting type (F50.01).  Relevant history/background: Becky [name] continues to be preoccupied with weight loss, and is distressed at the weight she has gained in the past several months (she was <100 lb in March 2023).    Assessment: Becky [name] has ***still been recording all food intake on a FitBit app, which shows low energy and protein intake (e.g., one day only 16 g protein)   Blind weight: *** lb. (140.6 lb on 01/15/22)  Ht is 60".  *** Usual eating pattern: ***2 meals and 0-2 snacks per day, including 0-2 balanced meals per day.   Veg intake:    ***6-10 servings/week.   Physical activity:   ***Has seldom been walking her dog b/c of foot pain.  Sleep:     ***4 1/2 hrs/night per FitBit.  Still taking 3 mg Lunesta ~10:30 PM. Symptoms  Light-headedness:  ***1-2 X day (not vertigo).   Swelling:   ***Hands daily.  Pain somewhat alleviated by Celebrex and gabapentin.   BP & FBG:   ***106-158; most about 110's. Cognitive problems: ***Denies.  24-hr recall suggests intake of *** kcal:  (Up at  AM) B ( AM)-   Snk ( AM)-   L ( PM)-   Snk ( PM)-   D ( PM)-   Snk ( PM)-   Typical day? {yes no:[id number]}   Previous kcal intake estimates: 01/15/22:    530 12/18/21:    800  11/20/21:  1560  10/23/21: <400 08/13/21:  1340  07/18/21:  1370  06/19/21:  1110  05/21/21:  1310 04/23/21:  1340  01/22/21:   720 11/17:   1160 08/29/20:    870  07/31/20:   875  06/29/20:    850  04/20/20:    700 03/23/20:             650 02/21/20   625 01/17/20   570 12/13/19      500  Intervention: Reviewed recent diet and exercise history, and ***.  For  recommendations and goals, see Patient Instructions.    Follow-up: Follow-up appt in 4 weeks.   Becky [name],Becky [name]   From 12/5 *** Best weight management happens for most people with a nutritionally balanced diet that's adequate in protein and energy.  When you restrict excessively, the body can respond by lowering metabolic rate, and "hanging on" to all the calories it gets.  In this condition, it will be hard to lose more weight and easy to gain weight on even a small energy increase.   To change this outcome requires liberalizing energy intake, which essentially allows your body to start trusting again that it will get a consistent source of adequate energy.   (Remember our conversation about the jigsaw puzzle, and all the pieces needing to be in place before the final outcome emerges.)   - Check your fasting blood glucose at least 3 times a week.   - Your concern about carb's (want to limit):               - Use as many whole, real foods as possible, including FRESH or frozen (vs canned) fruits & veg's.               -  You may want to try some high-protein or low-carb breads/wraps (whole-grain).               - Oatmeal:  You can add 8 g of protein with 1/3 c powdered milk for .               - Consider recipe ingredients, e.g., what can you substitute for onion rings?   Goals remain the same:   1. At least 3 REAL MEALS per day.  A real breakfast includes a source of protein and a starchy food.  A real lunch or dinner includes protein, carb, and fruit and/or vegetable.       Keep in mind your total daily protein goal is >60 grams.   2. Include at least a cup of vegetables in at least 10 meals per week.       Ways to include veg's:      Cooked: spinach, (other greens), zucchini, yellow squash, onions, carrots, green peas.      Raw: tomatoes, bell pepper, mushrooms, cucumber, peas.       Gazpacho or pureed soups.  (If using canned soup, first microwave veg's, then add soup, and reheat.)     Keep  frozen vegetables on hand, which you can microwave if in a hurry, or prepare any other way.   3. Set aside a few minutes each week to plan foods for the upcoming week, writing out a shopping list as you do this.     Document progress on the Goals Sheet provided.  Keep Goals Sheet where you'll see it every day!   Follow-up appt on Tuesday, February 15 at 11 AM.

## 2022-02-26 ENCOUNTER — Encounter: Payer: Self-pay | Admitting: Family Medicine

## 2022-02-26 ENCOUNTER — Ambulatory Visit (INDEPENDENT_AMBULATORY_CARE_PROVIDER_SITE_OTHER): Payer: 59 | Admitting: Family Medicine

## 2022-02-26 VITALS — Ht 60.0 in | Wt 147.2 lb

## 2022-02-26 DIAGNOSIS — F5001 Anorexia nervosa, restricting type: Secondary | ICD-10-CM | POA: Diagnosis not present

## 2022-02-26 NOTE — Patient Instructions (Addendum)
-  Start taking your sleep med earlier, backing up by ~15 min each night to get to at least 8 PM.   - Check your blood sugar when you experience light-headedness.    Goals remain the same:   1. At least 3 REAL MEALS per day.  A real breakfast includes a source of protein and a starchy food.  A real lunch or dinner includes protein, carb, and fruit and/or vegetable.       Keep in mind your total daily protein goal is >60 grams.   2. Include 2-3 cups of vegetables and at least the equivalent of 20 grams from a protein food in at least 10 meals per week.       Ways to include veg's:      Cooked: spinach, (other greens), zucchini, yellow squash, onions, carrots, green peas.      Raw: tomatoes, bell pepper, mushrooms, cucumber, peas.       Gazpacho or pureed soups.  (If using canned soup, first microwave veg's, then add soup, and reheat.)     Keep frozen vegetables on hand, which you can microwave if in a hurry, or prepare any other way. 3. Set aside a few minutes each week to plan foods for the upcoming week, writing out a shopping list as you do this.    Document progress on the Goals Sheet provided.     Follow-up appt on Tuesday, February 13 at 11 AM.

## 2022-03-01 ENCOUNTER — Ambulatory Visit: Payer: Medicare Other | Admitting: Family Medicine

## 2022-03-15 ENCOUNTER — Ambulatory Visit: Payer: Medicare Other | Admitting: Family Medicine

## 2022-03-21 ENCOUNTER — Encounter: Payer: Self-pay | Admitting: Allergy

## 2022-03-21 ENCOUNTER — Other Ambulatory Visit: Payer: Self-pay | Admitting: Allergy

## 2022-03-21 ENCOUNTER — Ambulatory Visit (INDEPENDENT_AMBULATORY_CARE_PROVIDER_SITE_OTHER): Payer: 59 | Admitting: Allergy

## 2022-03-21 ENCOUNTER — Other Ambulatory Visit: Payer: Self-pay

## 2022-03-21 VITALS — BP 124/78 | HR 51 | Temp 98.2°F | Resp 18 | Ht 60.0 in | Wt 145.3 lb

## 2022-03-21 DIAGNOSIS — J4530 Mild persistent asthma, uncomplicated: Secondary | ICD-10-CM

## 2022-03-21 DIAGNOSIS — J3089 Other allergic rhinitis: Secondary | ICD-10-CM

## 2022-03-21 DIAGNOSIS — H1013 Acute atopic conjunctivitis, bilateral: Secondary | ICD-10-CM | POA: Diagnosis not present

## 2022-03-21 MED ORDER — ALBUTEROL SULFATE HFA 108 (90 BASE) MCG/ACT IN AERS: 2.0000 | INHALATION_SPRAY | RESPIRATORY_TRACT | 1 refills | Status: DC | PRN

## 2022-03-21 MED ORDER — SYMBICORT 160-4.5 MCG/ACT IN AERO: 2.0000 | INHALATION_SPRAY | Freq: Two times a day (BID) | RESPIRATORY_TRACT | 5 refills | Status: DC

## 2022-03-21 MED ORDER — PATADAY 0.2 % OP SOLN: 1.0000 [drp] | Freq: Every day | OPHTHALMIC | 5 refills | Status: DC | PRN

## 2022-03-21 MED ORDER — RYALTRIS 665-25 MCG/ACT NA SUSP: 2.0000 | Freq: Two times a day (BID) | NASAL | 5 refills | Status: DC

## 2022-03-21 MED ORDER — LEVOCETIRIZINE DIHYDROCHLORIDE 5 MG PO TABS: 5.0000 mg | ORAL_TABLET | Freq: Every evening | ORAL | 5 refills | Status: DC
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
## 2022-03-21 NOTE — Patient Instructions (Addendum)
-   Will get you scheduled for skin testing visit  - Start taking: Xyzal (levocetirizine) '5mg'$  tablet once daily.  This is a long-acting antihistamine.  It is over-the-counter.  Ryaltris (olopatadine/mometasone) two sprays per nostril 2 times daily at this time.  It has olopatadine for nasal drainage control and mometasone for nasal congestion control.  Sample provided.   Pataday (olopatadine) one drop per eye daily as needed for itchy/watery eyes.   - You can use an extra dose of the antihistamine, if needed, for breakthrough symptoms.  - Recommend nasal saline rinses 1-7 times a week to remove allergens from the nasal cavities as well as help with mucous clearance (this is especially helpful to do before the nasal sprays are given).  Kit provided.  -Have access to albuterol inhaler 2 puffs every 4-6 hours as needed for cough/wheeze/shortness of breath/chest tightness.  May use 15-20 minutes prior to activity.   Monitor frequency of use.   -Start Symbicort 148mg 2 puffs twice a day at this time -You had a robust response to albuterol on lung function and almost normalized  Follow-up for skin testing

## 2022-03-21 NOTE — Addendum Note (Signed)
Addended by: Alvin Critchley, Aiden Rao on: 03/21/2022 05:37 PM   Modules accepted: Orders

## 2022-03-25 ENCOUNTER — Other Ambulatory Visit: Payer: Self-pay | Admitting: Internal Medicine

## 2022-03-25 DIAGNOSIS — B9681 Helicobacter pylori [H. pylori] as the cause of diseases classified elsewhere: Secondary | ICD-10-CM

## 2022-03-25 NOTE — Progress Notes (Unsigned)
Medical Nutrition Therapy (MNT)  PCP: Kathryne Eriksson, MD, [place] Neurologist: ? 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  Relevant history/background: Becky [name] continues to be preoccupied with weight loss, and is distressed at the weight she has gained in the past several months (she was <100 lb in March 2023).    Assessment: Becky [name]'s FitBit indicates she has been getting *** kcal/day and *** g protein.  She increased her target kcal to ***/day.  ***  Blind weight: 147.2 lb. (140.6 lb on 02/26/22)  Ht is 60".   Physical activity:   ***Unable to walk b/c of foot pain; averaging >5000 steps/day.  Sleep:      ***5-7 hrs/night freq'ly interrupted sleep per FitBit.  Range of 5-11 hrs, and most sleep happening ~3 AM.  Still taking 3 mg Lunesta ~10:30 PM. Symptoms  Light-headedness:  Several X day (not vertigo).   Swelling:   Hands daily.  Pain somewhat alleviated by Celebrex and gabapentin.   FBG:    118-132. Cognitive problems: Denies.  Progress on goals: 3 real meals/day: ***   Veg's&pro 10 X wk:  *** meals/week.   Weekly planning:  ***  24-hr recall suggests intake of *** kcal:  (Up at  AM) B ( AM)-   Snk ( AM)-   L ( PM)-   Snk ( PM)-   D ( PM)-   Snk ( PM)-   Typical day? {yes no:314532}    Previous kcal intake estimates: 02/26/22:    765 01/15/22:    530 12/18/21:    800  11/20/21:  1560  10/23/21: <400 08/13/21:  1340  07/18/21:  1370  06/19/21:  1110  05/21/21:  1310 04/23/21:  1340  01/22/21:   720 11/17:   1160 08/29/20:    870  07/31/20:   875  06/29/20:    850  04/20/20:    700 03/23/20:             650 02/21/20   625 01/17/20   570 12/13/19      500  Intervention: Reviewed recent diet and exercise history, and  again emphasized need for adequate kcal and nutrition.  For recommendations and goals, see Patient Instructions.    Follow-up: Follow-up appt in 4 weeks.   Becky [name],Becky [name]   From 1/16 *** - Start taking your sleep med earlier, backing up by ~15 min each night to get to at least 8 PM.   - Check your blood sugar when you experience light-headedness.     Goals remain the same:   1. At least 3 REAL MEALS per day.  A real breakfast includes a source of protein and a starchy food.  A real lunch or dinner includes protein, carb, and fruit and/or vegetable.       Keep in mind your total daily protein goal is >60 grams.   2. Include 2-3 cups of vegetables and at least the equivalent of 20 grams from a protein food in at least 10 meals per week.       Ways to include veg's:      Cooked: spinach, (other greens), zucchini, yellow squash, onions, carrots, green peas.      Raw: tomatoes, bell pepper, mushrooms, cucumber, peas.       Gazpacho or pureed  soups.  (If using canned soup, first microwave veg's, then add soup, and reheat.)     Keep frozen vegetables on hand, which you can microwave if in a hurry, or prepare any other way. 3. Set aside a few minutes each week to plan foods for the upcoming week, writing out a shopping list as you do this.     Document progress on the Goals Sheet provided.     Follow-up appt on Tuesday, February 13 at 11 AM.

## 2022-03-26 ENCOUNTER — Ambulatory Visit (INDEPENDENT_AMBULATORY_CARE_PROVIDER_SITE_OTHER): Payer: 59 | Admitting: Family Medicine

## 2022-03-26 VITALS — Ht 60.0 in | Wt 147.0 lb

## 2022-03-26 DIAGNOSIS — F5001 Anorexia nervosa, restricting type: Secondary | ICD-10-CM | POA: Diagnosis not present

## 2022-03-26 NOTE — Patient Instructions (Signed)
-   Start taking your sleep med earlier, backing up by ~15 min each night to get to at least 9 PM.   - Check your blood sugar when you experience light-headedness, and check fasting BG 3 X wk.   - Consider using your bike trainer instead of walking, which should be easier on your foot.   - Meal planning:  You will probably find the most satisfaction from meals that include at least 3 separate components.  For example, with your quiche, have a starch food as well as additional vegetable serving.    Having a wide range of both energy and protein intake is not advancing your own interest in managing your weight.  As you consume a low-energy, low-protein diet, you are likely losing muscle mass, which serves to meet your body's energy and/or protein needs.  Less muscle = lower metabolic rate = burning fewer calories per day, making it harder to lose weight.  IF YOU WANT TO LOSE SOME WEIGHT, EAT ENOUGH FOOD, INCLUDING PROTEIN & VEGETABLES.    Goals remain the same:   1. At least 3 REAL MEALS per day.  A real breakfast includes a source of protein and a starchy food.  A real lunch or dinner includes protein, carb, and fruit and/or vegetable.       Keep in mind your total daily protein goal is >60 grams.   2. Include 2-3 cups of vegetables and at least the equivalent of 20 grams from a protein food in at least 10 meals per week.       Ways to include veg's:      Cooked: spinach, (other greens), zucchini, yellow squash, onions, carrots, green peas.      Raw: tomatoes, bell pepper, mushrooms, cucumber, peas.       Gazpacho or pureed soups.  (If using canned soup, first microwave veg's, then add soup, and reheat.)     Keep frozen vegetables on hand, which you can microwave if in a hurry, or prepare any other way.  Buy a variety of vegetables to keep on hand.   3. Set aside a few minutes each week to plan foods for the upcoming week, writing out a shopping list as you do this.     Document progress on the Goals  Sheet provided.     Follow-up appt on Tuesday, March 12 at 11 AM.

## 2022-03-28 ENCOUNTER — Encounter: Payer: Self-pay | Admitting: Allergy

## 2022-03-28 ENCOUNTER — Ambulatory Visit (INDEPENDENT_AMBULATORY_CARE_PROVIDER_SITE_OTHER): Payer: 59 | Admitting: Allergy

## 2022-03-28 ENCOUNTER — Other Ambulatory Visit: Payer: Self-pay

## 2022-03-28 VITALS — BP 116/54 | HR 57 | Temp 97.8°F | Resp 16 | Wt 146.5 lb

## 2022-03-28 DIAGNOSIS — H1013 Acute atopic conjunctivitis, bilateral: Secondary | ICD-10-CM | POA: Diagnosis not present

## 2022-03-28 DIAGNOSIS — J3089 Other allergic rhinitis: Secondary | ICD-10-CM | POA: Diagnosis not present

## 2022-03-28 DIAGNOSIS — J4530 Mild persistent asthma, uncomplicated: Secondary | ICD-10-CM

## 2022-03-28 NOTE — Patient Instructions (Signed)
-   histamine control today was not reactive thus will obtain environmental allergy panel via bloodwork today - Start taking: Xyzal (levocetirizine) '5mg'$  tablet once daily.  This is a long-acting antihistamine.  It is over-the-counter.  Ryaltris (olopatadine/mometasone) two sprays per nostril 2 times daily at this time.  It has olopatadine for nasal drainage control and mometasone for nasal congestion control.   Pataday (olopatadine) one drop per eye daily as needed for itchy/watery eyes.   - You can use an extra dose of the antihistamine, if needed, for breakthrough symptoms.  - Recommend nasal saline rinses 1-7 times a week to remove allergens from the nasal cavities as well as help with mucous clearance (this is especially helpful to do before the nasal sprays are given).  Kit provided.  -Have access to albuterol inhaler 2 puffs every 4-6 hours as needed for cough/wheeze/shortness of breath/chest tightness.  May use 15-20 minutes prior to activity.   Monitor frequency of use.   -Continue Symbicort 147mg 2 puffs twice a day at this time  Follow-up in 3-4 months or sooner if needed

## 2022-03-28 NOTE — Progress Notes (Signed)
Follow-up Note  RE: Becky [name] MRN: [id number] DOB: July 19, 1955 Date of Office Visit: 03/28/2022   History of present illness: Becky [name] is a 67 y.o. female presenting today for skin testing visit.  She was last seen in the office on 03/21/22 by myself.  She states she is not sure when she last took antihistamine and could have been within past 72 hours.   She does state the symbicort seems to be helping and she is noting less phlegm in the chest.  She states she hasn't gotten distilled water yet to start during nasal saline rinses thus she didn't want to start the nasal spray, Ryaltris yet.  She also states the pharmacy seemed to not have all the medications prescribed in stock thus she plans to get all the medications recommended at last visit and start using to see if they will be effective for her.    Medication List: Current Outpatient Medications  Medication Sig Dispense Refill   Accu-Chek Softclix Lancets lancets 3 (three) times daily. for testing     albuterol (VENTOLIN HFA) 108 (90 Base) MCG/ACT inhaler Inhale 2 puffs into the lungs every 4 (four) hours as needed for wheezing or shortness of breath. 18 g 1   Azelastine-Fluticasone (DYMISTA) 137-50 MCG/ACT SUSP Place 1 spray into the nose 2 (two) times daily. 23 g 5   benztropine (COGENTIN) 0.5 MG tablet Take 0.5 mg by mouth 2 (two) times daily.     Blood Glucose Monitoring Suppl (ACCU-CHEK GUIDE) w/Device KIT      Blood Glucose Monitoring Suppl (GLUCOCOM BLOOD GLUCOSE MONITOR) DEVI Check blood sugars three times a day     Cyanocobalamin (VITAMIN B-12 PO) Take 1 tablet by mouth daily.     DULoxetine (CYMBALTA) 60 MG capsule Take 120 mg by mouth daily.     EPIPEN 2-PAK 0.3 MG/0.3ML SOAJ injection Inject 0.3 mg into the muscle as needed for anaphylaxis.  1   eszopiclone (LUNESTA) 1 MG TABS tablet Take 3 mg by mouth at bedtime as needed for sleep. Take immediately before bedtime     Eszopiclone 3 MG TABS Take 3 mg  by mouth at bedtime.     gabapentin (NEURONTIN) 300 MG capsule Take 300 mg by mouth 3 (three) times daily.     hydrochlorothiazide (MICROZIDE) 12.5 MG capsule Take 12.5 mg by mouth daily.     levocetirizine (XYZAL) 5 MG tablet Take 1 tablet (5 mg total) by mouth every evening. 30 tablet 5   lidocaine (LIDODERM) 5 % Place 1 patch onto the skin daily. Remove & Discard patch within 12 hours or as directed by MD 30 patch 0   Multiple Vitamins-Minerals (MULTIVITAMIN) tablet Take 1 tablet by mouth daily. 30 tablet 0   nebivolol (BYSTOLIC) 10 MG tablet Take 5 mg by mouth daily.     omeprazole (PRILOSEC) 40 MG capsule Take 40 mg by mouth daily.     PATADAY 0.2 % SOLN Place 1 drop into both eyes daily as needed. 2.5 mL 5   SYMBICORT 160-4.5 MCG/ACT inhaler Inhale 2 puffs into the lungs 2 (two) times daily. 10.2 g 5   thiamine 100 MG tablet Take 1 tablet (100 mg total) by mouth daily. 30 tablet 0   traZODone (DESYREL) 100 MG tablet Take 200 mg by mouth at bedtime.     VITAMIN D PO Take 1 tablet by mouth daily.     feeding supplement (ENSURE ENLIVE / ENSURE PLUS) LIQD Take 237 mLs by mouth  3 (three) times daily between meals. 237 mL 12   OLANZapine (ZYPREXA) 5 MG tablet Take 1 tablet (5 mg total) by mouth at bedtime. (Patient taking differently: Take 2.5 mg by mouth at bedtime.) 30 tablet 0   Current Facility-Administered Medications  Medication Dose Route Frequency Provider Last Rate Last Admin   methylPREDNISolone acetate (DEPO-MEDROL) injection 40 mg  40 mg Intra-articular Once Dickie La, MD         Known medication allergies: Allergies  Allergen Reactions   Methadone Shortness Of Breath and Other (See Comments)    Chest Pain    Nitrofurantoin Shortness Of Breath and Other (See Comments)    Chest Pain    Oxycodone-Acetaminophen Shortness Of Breath and Other (See Comments)    Chest pain    Percocet [Oxycodone-Acetaminophen] Shortness Of Breath and Other (See Comments)    Chest pain    Penicillins Nausea And Vomiting, Rash, Other (See Comments) and Palpitations    Fever, including amoxicillin  Has patient had a PCN reaction causing immediate rash, facial/tongue/throat swelling, SOB or lightheadedness with hypotension: Yes Has patient had a PCN reaction causing severe rash involving mucus membranes or skin necrosis: No Has patient had a PCN reaction that required hospitalization No Has patient had a PCN reaction occurring within the last 10 years: No If all of the above answers are "NO", then may proceed with Cephalosporin use.  Fever, including amoxicillin  Has patient had a PCN reaction causing immediate rash, facial/tongue/throat swelling, SOB or lightheadedness with hypotension: Yes Has patient had a PCN reaction causing severe rash involving mucus membranes or skin necrosis: No Has patient had a PCN reaction that required hospitalization No Has patient had a PCN reaction occurring within the last 10 years: No If all of the above answers are "NO", then may proceed with Cephalosporin use. Fever, including amoxicillin    Amoxicillin Rash   Aspirin Other (See Comments)    stomach pain stomach pain stomach pain   Clarithromycin Rash and Other (See Comments)    Fever    Codeine Nausea And Vomiting and Other (See Comments)   Hydrocodone-Acetaminophen Itching    Premeditate benadryl   Morphine Nausea And Vomiting and Other (See Comments)   Ms Contin [Morphine Sulfate] Nausea And Vomiting    Morphine IR and ER   Propoxyphene Nausea Only    Sick to stomach     Physical examination (limited): Blood pressure (!) 116/54, pulse (!) 57, temperature 97.8 F (36.6 C), temperature source Temporal, resp. rate 16, weight 146 lb 8 oz (66.5 kg), SpO2 98 %.  General: Alert, interactive, in no acute distress. Skin: Warm and dry, without lesions or rashes. Extremities:  No clubbing, cyanosis or edema. Neuro:   Grossly intact.  Diagnositics/Labs:  Allergy testing:  histamine  control was non-reactive Allergy testing results were read and interpreted by provider, documented by clinical staff.   Assessment and plan: Rhinoconjunctivitis, allergic Mild persistent asthma  - histamine control today was not reactive thus will obtain environmental allergy panel via bloodwork today - Start taking: Xyzal (levocetirizine) 30m tablet once daily.  This is a long-acting antihistamine.  It is over-the-counter.  Ryaltris (olopatadine/mometasone) two sprays per nostril 2 times daily at this time.  It has olopatadine for nasal drainage control and mometasone for nasal congestion control.   Pataday (olopatadine) one drop per eye daily as needed for itchy/watery eyes.   - You can use an extra dose of the antihistamine, if needed, for breakthrough symptoms.  -  Recommend nasal saline rinses 1-7 times a week to remove allergens from the nasal cavities as well as help with mucous clearance (this is especially helpful to do before the nasal sprays are given).  Kit provided.  -Have access to albuterol inhaler 2 puffs every 4-6 hours as needed for cough/wheeze/shortness of breath/chest tightness.  May use 15-20 minutes prior to activity.   Monitor frequency of use.   -Continue Symbicort 193mg 2 puffs twice a day at this time  Follow-up in 3-4 months or sooner if needed  I appreciate the opportunity to take part in Douglas's care. Please do not hesitate to contact me with questions.  Sincerely,   SPrudy Feeler MD [facility]ergy[facility]

## 2022-04-01 LAB — ALLERGENS W/TOTAL IGE AREA 2

## 2022-04-22 NOTE — Progress Notes (Signed)
Medical Nutrition Therapy (MNT)  PCP: Kathryne Eriksson, MD, [place] Neurologist: ? 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  Relevant history/background: Becky [name] continues to be preoccupied with weight loss, and is distressed at the weight she has gained in the past several months (she was <100 lb in March 2023).    Assessment: Becky [name] ***.  Allergy skin testing on 03/28/22 ***.  FitBit indicates she has been getting an average of ***700 kcal/day (range ***545 to ***869) and ***53 g protein (range ***27-79 grams).  She is using a target kcal of ***1200/day).    Blind weight: *** lb. (147.0 lb on 02/26/22)  Ht is 60".   Physical activity: Has been walking ***3-4 X wk despite foot pain; averaging ***7000 steps/day.  Sleep:  ***5-6 hrs/night freq'ly interrupted sleep per FitBit; range of ***4-11 hrs.  Taking 3 mg Lunesta at ***11 PM. Symptoms  Light-headedness:  ***Several X day (not vertigo).  Has not checked BG when light-headed b/c she left it at her boyfriend's house.   Swelling:   ***Hands, daily.  Pain alleviated some by Celebrex and gabapentin.   FBG:    ***.  Progress on goals: 3 real meals/day: ***>15 meals/wk Veg's&pro 10 X wk:  ***10 meals/week.   Weekly planning:  ***Met goal each week.   24-hr recall suggests intake of *** kcal:  (Up at  AM) B ( AM)-   Snk ( AM)-   L ( PM)-   Snk ( PM)-   D ( PM)-   Snk ( PM)-   Typical day? {yes no:314532}   Previous kcal intake estimates: 03/26/22:   630  02/26/22:    765 01/15/22:    530 12/18/21:    800  11/20/21:  1560  10/23/21: <400 08/13/21:  1340  07/18/21:  1370  06/19/21:  1110  05/21/21:  1310 04/23/21:  1340  01/22/21:   720 11/17:   1160 08/29/20:    870  07/31/20:   875   06/29/20:    850  04/20/20:    700 03/23/20:             650 02/21/20   625 01/17/20   570 12/13/19      500  Intervention: Reviewed recent diet and exercise history, and again emphasized need for adequate kcal and nutrition.  For recommendations and goals, see Patient Instructions.    Follow-up: Follow-up appt in 4 weeks.   Ayaz Sondgeroth,[name]   From 2/13 *** - Start taking your sleep med earlier, backing up by ~15 min each night to get to at least 9 PM.   - Check your blood sugar when you experience light-headedness, and check fasting BG 3 X wk.   - Consider using your bike trainer instead of walking, which should be easier on your foot.   - Meal planning:  You will probably find the most satisfaction from meals that include at least 3 separate components.  For example, with your quiche, have a starch food as well as additional vegetable serving.    Having a wide range of both energy and protein intake is not advancing your own interest in managing your weight.  As you consume a low-energy, low-protein diet, you are likely losing muscle mass, which serves to meet your body's energy  and/or protein needs.  Less muscle = lower metabolic rate = burning fewer calories per day, making it harder to lose weight.  IF YOU WANT TO LOSE SOME WEIGHT, EAT ENOUGH FOOD, INCLUDING PROTEIN & VEGETABLES.    Goals remain the same:   1. At least 3 REAL MEALS per day.  A real breakfast includes a source of protein and a starchy food.  A real lunch or dinner includes protein, carb, and fruit and/or vegetable.       Keep in mind your total daily protein goal is >60 grams.   2. Include 2-3 cups of vegetables and at least the equivalent of 20 grams from a protein food in at least 10 meals per week.       Ways to include veg's:      Cooked: spinach, (other greens), zucchini, yellow squash, onions, carrots, green peas.      Raw: tomatoes, bell pepper, mushrooms, cucumber, peas.       Gazpacho or pureed soups.  (If using  canned soup, first microwave veg's, then add soup, and reheat.)     Keep frozen vegetables on hand, which you can microwave if in a hurry, or prepare any other way.  Buy a variety of vegetables to keep on hand.   3. Set aside a few minutes each week to plan foods for the upcoming week, writing out a shopping list as you do this.    Document progress on the Goals Sheet provided.     Follow-up appt on ***.

## 2022-04-23 ENCOUNTER — Ambulatory Visit (INDEPENDENT_AMBULATORY_CARE_PROVIDER_SITE_OTHER): Payer: 59 | Admitting: Family Medicine

## 2022-04-23 ENCOUNTER — Encounter (HOSPITAL_COMMUNITY): Payer: Self-pay | Admitting: Student in an Organized Health Care Education/Training Program

## 2022-04-23 VITALS — Ht 60.0 in | Wt 143.8 lb

## 2022-04-23 DIAGNOSIS — F5001 Anorexia nervosa, restricting type: Secondary | ICD-10-CM | POA: Diagnosis not present

## 2022-04-23 NOTE — Patient Instructions (Signed)
-   Check your blood sugar when you experience light-headedness.   - Consider using your bike trainer instead of walking, which should be easier on your foot.      Also, other exercises (like strength building) can also be very beneficial without causing foot pain.  Whatever you do, (1) make a specific workout plan; and (2) be consistent! (Figure out a time that consistently works for you.)   - Consider getting together with friends within the next couple of weeks.    Goals remain the same:   1. At least 3 REAL MEALS per day.  A real breakfast includes a source of protein and a starchy food.  A real lunch or dinner includes protein, carb, and fruit and/or vegetable.       Keep in mind your total daily protein goal is >60 grams.   2. Include 2-3 cups of vegetables and at least the equivalent of 20 grams from a protein food in at least 10 meals per week.       Ways to include veg's:      Cooked: spinach, (other greens), zucchini, yellow squash, onions, carrots, green peas.      Raw: tomatoes, bell pepper, mushrooms, cucumber, peas.       Gazpacho or pureed soups.  (If using canned soup, first microwave veg's, then add soup, and reheat.)     Keep frozen vegetables on hand, which you can microwave if in a hurry, or prepare any other way.  Buy a variety of vegetables to keep on hand.   3. Set aside a few minutes each week to plan foods for the upcoming week, writing out a shopping list as you do this.    Document progress on the Goals Sheet provided.     Follow-up appt on Tues, April 16 at 11 AM.

## 2022-05-15 ENCOUNTER — Encounter: Payer: Self-pay | Admitting: Allergy

## 2022-05-15 ENCOUNTER — Other Ambulatory Visit: Payer: Self-pay

## 2022-05-15 ENCOUNTER — Ambulatory Visit (INDEPENDENT_AMBULATORY_CARE_PROVIDER_SITE_OTHER): Payer: 59 | Admitting: Allergy

## 2022-05-15 VITALS — BP 120/78 | HR 53 | Temp 97.7°F | Resp 16 | Ht 60.0 in | Wt 141.4 lb

## 2022-05-15 DIAGNOSIS — H1013 Acute atopic conjunctivitis, bilateral: Secondary | ICD-10-CM | POA: Diagnosis not present

## 2022-05-15 DIAGNOSIS — J4530 Mild persistent asthma, uncomplicated: Secondary | ICD-10-CM | POA: Diagnosis not present

## 2022-05-15 DIAGNOSIS — J3089 Other allergic rhinitis: Secondary | ICD-10-CM | POA: Diagnosis not present

## 2022-05-15 MED ORDER — LEVOCETIRIZINE DIHYDROCHLORIDE 5 MG PO TABS: 5.0000 mg | ORAL_TABLET | Freq: Every evening | ORAL | 2 refills | Status: DC

## 2022-05-15 MED ORDER — RYALTRIS 665-25 MCG/ACT NA SUSP: 2.0000 | Freq: Two times a day (BID) | NASAL | 2 refills | Status: DC

## 2022-05-15 MED ORDER — SYMBICORT 160-4.5 MCG/ACT IN AERO: 2.0000 | INHALATION_SPRAY | Freq: Two times a day (BID) | RESPIRATORY_TRACT | 2 refills | Status: DC

## 2022-05-15 MED ORDER — ALBUTEROL SULFATE HFA 108 (90 BASE) MCG/ACT IN AERS: 2.0000 | INHALATION_SPRAY | Freq: Four times a day (QID) | RESPIRATORY_TRACT | 1 refills | Status: DC | PRN

## 2022-05-15 MED ORDER — OLOPATADINE HCL 0.2 % OP SOLN: 1.0000 [drp] | Freq: Every day | OPHTHALMIC | 2 refills | Status: DC | PRN

## 2022-05-15 NOTE — Progress Notes (Signed)
Follow-up Note  RE: Becky [name] MRN: [id number] DOB: January 27, 1956 Date of Office Visit: 05/15/2022   History of present illness: Becky [name] is a 67 y.o. female presenting today for skin testing.  She was last seen in the office on 03/28/22 by myself.  At that visit her histamine was non-reactive and thus proceeded to do environmental allergy testing via serum IgE levels.  This however was negative.  Thus recommended that she return for skin testing when she is off antihistamines for at least 72 hours or longer.  She has been off the levocetirizine for this timeframe.  She does feel that the levocetirizine has been helpful.  She states there was a period of time where she was noticing less congestion and cough.  However this week she has noticed more congestion and cough which could be due to not being on the levocetirizine.  She has used her albuterol couple times this week for the cough symptoms.  She does use Symbicort 160 mcg taking 2 puffs twice a day.  She also states she has been using the Ryaltris nasal spray which she states does help with congestion and drainage control.  She also has needed to use the Pataday eyedrop which also helps with itchy watery eyes.  She does feel like she is being triggered by pollen.  Review of systems: Review of Systems  Constitutional: Negative.   HENT:  Positive for congestion.   Eyes: Negative.   Respiratory:  Positive for cough.   Cardiovascular: Negative.   Gastrointestinal: Negative.   Musculoskeletal: Negative.   Skin: Negative.   Allergic/Immunologic: Negative.   Neurological: Negative.      All other systems negative unless noted above in HPI  Past medical/social/surgical/family history have been reviewed and are unchanged unless specifically indicated below.  No changes  Medication List: Current Outpatient Medications  Medication Sig Dispense Refill   Accu-Chek Softclix Lancets lancets 3 (three) times daily. for  testing     albuterol (VENTOLIN HFA) 108 (90 Base) MCG/ACT inhaler Inhale 2 puffs into the lungs every 4 (four) hours as needed for wheezing or shortness of breath. 18 g 1   Azelastine-Fluticasone (DYMISTA) 137-50 MCG/ACT SUSP Place 1 spray into the nose 2 (two) times daily. 23 g 5   Blood Glucose Monitoring Suppl (ACCU-CHEK GUIDE) w/Device KIT      Blood Glucose Monitoring Suppl (GLUCOCOM BLOOD GLUCOSE MONITOR) DEVI Check blood sugars three times a day     buPROPion (WELLBUTRIN XL) 150 MG 24 hr tablet Take 150 mg by mouth every morning.     celecoxib (CELEBREX) 200 MG capsule Take 200 mg by mouth 2 (two) times daily as needed.     Cyanocobalamin (VITAMIN B-12 PO) Take 1 tablet by mouth daily.     DULoxetine (CYMBALTA) 60 MG capsule Take 120 mg by mouth daily.     EPIPEN 2-PAK 0.3 MG/0.3ML SOAJ injection Inject 0.3 mg into the muscle as needed for anaphylaxis.  1   eszopiclone (LUNESTA) 1 MG TABS tablet Take 3 mg by mouth at bedtime as needed for sleep. Take immediately before bedtime     Eszopiclone 3 MG TABS Take 3 mg by mouth at bedtime.     gabapentin (NEURONTIN) 300 MG capsule Take 300 mg by mouth 3 (three) times daily.     hydrochlorothiazide (MICROZIDE) 12.5 MG capsule Take 12.5 mg by mouth daily.     levocetirizine (XYZAL) 5 MG tablet Take 1 tablet (5 mg total) by mouth every  evening. 30 tablet 5   lidocaine (LIDODERM) 5 % Place 1 patch onto the skin daily. Remove & Discard patch within 12 hours or as directed by MD 30 patch 0   Multiple Vitamins-Minerals (MULTIVITAMIN) tablet Take 1 tablet by mouth daily. 30 tablet 0   nebivolol (BYSTOLIC) 10 MG tablet Take 5 mg by mouth daily.     omeprazole (PRILOSEC) 40 MG capsule Take 40 mg by mouth daily.     PATADAY 0.2 % SOLN Place 1 drop into both eyes daily as needed. 2.5 mL 5   SYMBICORT 160-4.5 MCG/ACT inhaler Inhale 2 puffs into the lungs 2 (two) times daily. 10.2 g 5   traZODone (DESYREL) 100 MG tablet Take 200 mg by mouth at bedtime.      VITAMIN D PO Take 1 tablet by mouth daily.     thiamine 100 MG tablet Take 1 tablet (100 mg total) by mouth daily. (Patient not taking: Reported on 05/15/2022) 30 tablet 0   Current Facility-Administered Medications  Medication Dose Route Frequency Provider Last Rate Last Admin   methylPREDNISolone acetate (DEPO-MEDROL) injection 40 mg  40 mg Intra-articular Once Dickie La, MD         Known medication allergies: Allergies  Allergen Reactions   Methadone Shortness Of Breath and Other (See Comments)    Chest Pain    Nitrofurantoin Shortness Of Breath and Other (See Comments)    Chest Pain    Oxycodone-Acetaminophen Shortness Of Breath and Other (See Comments)    Chest pain    Percocet [Oxycodone-Acetaminophen] Shortness Of Breath and Other (See Comments)    Chest pain   Penicillins Nausea And Vomiting, Rash, Other (See Comments) and Palpitations    Fever, including amoxicillin  Has patient had a PCN reaction causing immediate rash, facial/tongue/throat swelling, SOB or lightheadedness with hypotension: Yes Has patient had a PCN reaction causing severe rash involving mucus membranes or skin necrosis: No Has patient had a PCN reaction that required hospitalization No Has patient had a PCN reaction occurring within the last 10 years: No If all of the above answers are "NO", then may proceed with Cephalosporin use.  Fever, including amoxicillin  Has patient had a PCN reaction causing immediate rash, facial/tongue/throat swelling, SOB or lightheadedness with hypotension: Yes Has patient had a PCN reaction causing severe rash involving mucus membranes or skin necrosis: No Has patient had a PCN reaction that required hospitalization No Has patient had a PCN reaction occurring within the last 10 years: No If all of the above answers are "NO", then may proceed with Cephalosporin use. Fever, including amoxicillin    Amoxicillin Rash   Aspirin Other (See Comments)    stomach pain stomach  pain stomach pain   Clarithromycin Rash and Other (See Comments)    Fever    Codeine Nausea And Vomiting and Other (See Comments)   Hydrocodone-Acetaminophen Itching    Premeditate benadryl   Morphine Nausea And Vomiting and Other (See Comments)   Ms Contin [Morphine Sulfate] Nausea And Vomiting    Morphine IR and ER   Propoxyphene Nausea Only    Sick to stomach     Physical examination: Blood pressure 120/78, pulse (!) 53, temperature 97.7 F (36.5 C), resp. rate 16, height 5' (1.524 m), weight 141 lb 6.4 oz (64.1 kg), SpO2 95 %.  General: Alert, interactive, in no acute distress. HEENT: PERRLA, TMs pearly gray, turbinates non-edematous without discharge, post-pharynx non erythematous. Neck: Supple without lymphadenopathy. Lungs: Clear to auscultation without wheezing,  rhonchi or rales. {no increased work of breathing. CV: Normal S1, S2 without murmurs. Abdomen: Nondistended, nontender. Skin: Warm and dry, without lesions or rashes. Extremities:  No clubbing, cyanosis or edema. Neuro:   Grossly intact.  Diagnositics/Labs: Labs:  Component     Latest Ref Rng 03/28/2022  IgE (Immunoglobulin E), Serum     6 - 495 IU/mL 8   D Pteronyssinus IgE     Class 0 kU/L <0.10   D Farinae IgE     Class 0 kU/L <0.10   Cat Dander IgE     Class 0 kU/L <0.10   Dog Dander IgE     Class 0 kU/L <0.10   Gu[place] Grass IgE     Class 0 kU/L <0.10   Timothy Grass IgE     Class 0 kU/L <0.10   Johnson Grass IgE     Class 0 kU/L <0.10   Cockroach, German IgE     Class 0 kU/L <0.10   Penicillium Chrysogen IgE     Class 0 kU/L <0.10   Cladosporium Herbarum IgE     Class 0 kU/L <0.10   Aspergillus Fumigatus IgE     Class 0 kU/L <0.10   Alternaria Alternata IgE     Class 0 kU/L <0.10   Maple/Box Elder IgE     Class 0 kU/L <0.10   Common Silver Wendee Copp IgE     Class 0 kU/L <0.10   Cedar, Georgia IgE     Class 0 kU/L <0.10   Oak, White IgE     Class 0 kU/L <0.10   Elm, American IgE      Class 0 kU/L <0.10   Cottonwood IgE     Class 0 kU/L <0.10   Pecan, Hickory IgE     Class 0 kU/L <0.10   White Mulberry IgE     Class 0 kU/L <0.10   Ragweed, Short IgE     Class 0 kU/L <0.10   Pigweed, Rough IgE     Class 0 kU/L <0.10   Sheep Sorrel IgE Qn     Class 0 kU/L <0.10   Mouse Urine IgE     Class 0 kU/L <0.10      Spirometry: FEV1: 1.61L 79%, FVC: 2.11L 81%, ratio consistent with nonobstructive pattern  Allergy testing:   Airborne Adult Perc - 05/15/22 1000     Time Antigen Placed 1050    Allergen Manufacturer Greer    Location Back    Number of Test 57    1. Control-Buffer 50% Glycerol Negative    2. Control-Histamine 1 mg/ml 2+    3. Albumin saline Negative    4. Elk River Negative    5. [place] Negative    6. Johnson 2+    7. Le Roy Blue Negative    10. Sweet Vernal Negative    11. Timothy Negative    12. Cocklebur Negative    13. Burweed Marshelder Negative    14. Ragweed, short Negative    15. Ragweed, Giant Omitted    16. Plantain,  English Negative    17. Lamb's Quarters 2+    18. Sheep Sorrell Negative    19. Rough Pigweed Negative    20. Marsh Elder, Rough Negative    21. Mugwort, Common Negative    22. Ash mix Negative    23. Birch mix Negative    24. Beech American Negative    25. Box, Elder Negative    26. Cedar, red Negative  27. Cottonwood, Eastern Negative    28. Elm mix Negative    29. Hickory Negative    30. Maple mix Negative    31. Oak, [place] mix Negative    32. Pecan Pollen Negative    33. Pine mix Negative    34. Sycamore Eastern Negative    35. Boaz, Black Pollen Negative    36. Alternaria alternata Negative    37. Cladosporium Herbarum Negative    38. Aspergillus mix Negative    39. Penicillium mix Negative    40. Bipolaris sorokiniana (Helminthosporium) Negative    41. Drechslera spicifera (Curvularia) Negative    42. Mucor plumbeus Negative    43. Fusarium moniliforme Negative    44. Aureobasidium pullulans  (pullulara) Negative    45. Rhizopus oryzae Negative    46. Botrytis cinera Negative    47. Epicoccum nigrum Negative    48. Phoma betae Negative    49. Candida Albicans Negative    50. Trichophyton mentagrophytes Negative    51. Mite, D Farinae  5,000 AU/ml Negative    52. Mite, D Pteronyssinus  5,000 AU/ml Negative    53. Cat Hair 10,000 BAU/ml Negative    54.  Dog Epithelia Negative    55. Mixed Feathers Negative    56. Horse Epithelia Negative    57. Cockroach, German Negative    58. Mouse Negative    59. Tobacco Leaf Negative             Allergy testing results were read and interpreted by provider, documented by clinical staff.   Assessment and plan: Allergic rhinitis with conjunctivitis  - Environmental allergy panel via bloodwork was negative - Skin testing today for environmental allergy panel is positive to grass and weed pollens.  Allergen avoidance measures provided.   - Continue Xyzal (levocetirizine) 5mg  tablet once daily.  You can use an extra dose of the antihistamine, if needed, for breakthrough symptoms.  - Continue Ryaltris (olopatadine/mometasone) two sprays per nostril 2 times daily at this time.    - Continue Pataday (olopatadine) one drop per eye daily as needed for itchy/watery eyes.   - Recommend nasal saline rinses 1-7 times a week to remove allergens from the nasal cavities as well as help with mucous clearance (this is especially helpful to do before the nasal sprays are given).  Kit provided.  -Have access to albuterol inhaler 2 puffs every 4-6 hours as needed for cough/wheeze/shortness of breath/chest tightness.  May use 15-20 minutes prior to activity.   Monitor frequency of use.   -Continue Symbicort 171mcg 2 puffs twice a day at this time  Follow-up in 4-6 months or sooner if needed  I appreciate the opportunity to take part in Nnenna's care. Please do not hesitate to contact me with questions.  Sincerely,   Prudy Feeler,  MD Allergy/Immunology [facility]

## 2022-05-15 NOTE — Patient Instructions (Addendum)
-   Environmental allergy panel via bloodwork was negative - Skin testing today for environmental allergy panel is positive to grass and weed pollens.  Allergen avoidance measures provided.   - Continue Xyzal (levocetirizine) 5mg  tablet once daily.  You can use an extra dose of the antihistamine, if needed, for breakthrough symptoms.  - Continue Ryaltris (olopatadine/mometasone) two sprays per nostril 2 times daily at this time.    - Continue Pataday (olopatadine) one drop per eye daily as needed for itchy/watery eyes.   - Recommend nasal saline rinses 1-7 times a week to remove allergens from the nasal cavities as well as help with mucous clearance (this is especially helpful to do before the nasal sprays are given).  Kit provided.  -Have access to albuterol inhaler 2 puffs every 4-6 hours as needed for cough/wheeze/shortness of breath/chest tightness.  May use 15-20 minutes prior to activity.   Monitor frequency of use.   -Continue Symbicort 134mcg 2 puffs twice a day at this time  Follow-up in 4-6 months or sooner if needed

## 2022-05-28 ENCOUNTER — Ambulatory Visit: Payer: 59 | Admitting: Family Medicine

## 2022-06-27 ENCOUNTER — Ambulatory Visit: Payer: 59 | Admitting: Allergy

## 2022-07-02 ENCOUNTER — Ambulatory Visit: Payer: 59 | Admitting: Family Medicine

## 2022-08-01 ENCOUNTER — Ambulatory Visit (INDEPENDENT_AMBULATORY_CARE_PROVIDER_SITE_OTHER): Payer: 59 | Admitting: Family Medicine

## 2022-08-01 VITALS — Ht 60.0 in | Wt 135.0 lb

## 2022-08-01 DIAGNOSIS — F5001 Anorexia nervosa, restricting type: Secondary | ICD-10-CM | POA: Diagnosis not present

## 2022-08-01 NOTE — Patient Instructions (Addendum)
-   Ask Dr. Andrey Campanile to order a bone scan to look at your bone health.  Ask if your [facility] plan covers  OsteoStrong.  If yes, you may want to get a bone scan, then start OsteoStrong the following week.    Goals remain the same:   1. At least 3 REAL MEALS per day.  A real breakfast includes a source of protein and a starchy food.  A real lunch or dinner includes protein, carb, and fruit and/or vegetable.       Keep in mind your total daily protein goal is >60 grams.   2. Include vegetables and at least 20 grams of protein in 10 or more meals per week.       Ways to include veg's:      Cooked: spinach, (other greens), zucchini, yellow squash, onions, carrots, green peas, broccoli, asparagus.      Raw: tomatoes, bell pepper, mushrooms, cucumbers, peas.       Gazpacho or pureed soups.  (If using canned soup, first microwave veg's, then add soup, and reheat.)     Keep frozen vegetables on hand, which you can microwave if in a hurry, or prepare any other way.  Buy a variety of vegetables to keep on hand.  Also remember that most vegetables can be roasted, and eaten hot or leftover, cold.       Protein:  BEANS (minimum of 1 cup), dairy, fake meats, eggs, nut butters.   3. Set aside a few minutes each week to plan foods for the upcoming week, writing out a shopping list as you do this.    4. Check fasting BG daily, and record.   Complete the Meal Planning form provided today, and bring to your follow-up appt for review and feed-back.    Document progress on your Goals Sheet.    Follow-up appt on Monday, July 15 at 2:30 PM (and on Monday, August 12 at 11 AM).

## 2022-08-01 NOTE — Progress Notes (Signed)
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  Relevant history/background: Brittnee continues to be preoccupied with weight loss, and is distressed at the weight she has gained in the past several months (she was <100 lb in March 2023).  She discontinued MNT in March, citing an inability to follow through with dietary recommendations.  She continues to see therapist [facility] weekly.   Assessment: Avon has been taking care of her mom at least once a week, usually more often. She has kept extensive records of her food intake, sleep, and daily steps using FitBit, but has not focused on or documented any of the behavioral goals recommended previously.  Per her FitBit information, she averaged a daily intake of 473 kcal and 49 g protein last week.  Jennylee feels certain that she cannot keep her weight where she wants it unless she severely restricts, although she is also aware that her prolonged malnutrition is probably the main reason she has lost all but 5 teeth.  She has an upper partial and a lower full denture, which makes it hard to chew many foods.   Ceilidh has continued to struggle with depression and suicide ideation in the past few months, recovering from a dysfunctional relationship and some ongoing hurtful family dynamics.  With help of her therapist Baltazar Apo and a recent medication change, she is doing better now, and while wary of weight gain, she feels ready to work on improving her diet.   I asked Koriann to check FBG daily, trying to determine how much hypoglycemia may be contributing to her light-headedness.    Blind weight: 135.0 lb. (143.8 lb on  04/23/22)  Ht is 60".   Physical activity: Has been walking her dog 30-90 min 3-4 X wk 3-4; foot pain has improved; averaging 5000-10,000 steps/day.  (Registered for 30-mile dog walk for month of June, benefiting [employer]  Sleep:  4-5 hrs/night sleep per FitBit. Taking 3 mg Lunesta at 10 PM at bedtime of 10 PM; wakes up between 3 and 5 AM.   Symptoms  Light-headedness:  Every day, all day (not vertigo).  Even feels it when sitting.  Still has not checked BG when light-headed.    Swelling:   Denies.  Pain alleviated some by Celebrex and gabapentin.   FBG:    Has not measured.   Progress on March 2024 behavioral goals: 3 real meals/day: 1 meal plus 2 snacks/day (except on "Sunday, when with her mother, she has no snack).   Veg's&pro 10 X wk:  Veg's 2-3 X wk; protein 2-3 X day, e.g., yogurt for bkfst, tuna or plant-based fake meat for snacks.  Weekly planning:  Not doing; just repeating same breakfast and snacks daily.     24" -hr recall suggests intake of ~420 kcal:  (Up at 4:30 AM; drank water) B (4:30 AM)-  5 oz flavored lite yogurt (80 kcal, 12 g pro); 2 small tortillas (100 kcal)   180 Snk ( AM)-  --- L (12:30 PM)-  2 oz tuna in spinach wrap (70 kcal), relish, 2 small sugar-free Jell-O (10 kcal)  150 Snk ( PM)-  --- D (6:30 PM)-  2 c cooked spinach, 2 small sugar-free Jell-O (10 kcal)  90 Snk ( PM)-  --- Typical day? Yes.    Drinking about 32 oz water/day.  Some days she gets more kcal.    Previous kcal intake estimates: 03/26/22:   630  02/26/22:    765 01/15/22:    530 12/18/21:    800  11/20/21:  1560  10/23/21: <400 08/13/21:  1340  07/18/21:  1370  06/19/21:  1110  05/21/21:  1310 04/23/21:  1340  01/22/21:   720 11/17:   1160 08/29/20:    870  07/31/20:   875  06/29/20:    850  04/20/20:    700 03/23/20:             650 02/21/20   625 01/17/20   570 12/13/19      500  Intervention: Reviewed recent diet and exercise history, and again emphasized need for adequate kcal and  nutrition.  For recommendations and goals, see Patient Instructions.    Follow-up: Follow-up appt in 4 weeks.   Javonnie Illescas,[name]

## 2022-08-26 ENCOUNTER — Ambulatory Visit (INDEPENDENT_AMBULATORY_CARE_PROVIDER_SITE_OTHER): Payer: 59 | Admitting: Family Medicine

## 2022-08-26 DIAGNOSIS — F5001 Anorexia nervosa, restricting type: Secondary | ICD-10-CM

## 2022-08-26 NOTE — Progress Notes (Signed)
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  Relevant history/background: Moe continues to be preoccupied with weight loss, and is distressed at the weight she has gained in the past several months (she was <100 lb in March 2023).  She discontinued MNT from March to June 2024, citing an inability to follow through with dietary recommendations.  Recognizing the health consequences of malnutrition, she is motivated to try again, although remains dubious that she can ever eat a normal amount of food without gaining weight.  She continues to see therapist [facility] weekly.   Assessment: Kinsly has not been able to meet behavioral goals established at last MNT appt.  She is consumed with the need to lose weight, and feels terrified of regaining weight.  Per her FitBit information, she averaged a daily intake of 340-478 kcal and 35-49 g protein in the past month.  Stephinie feels guilty if she eats more than 200 kcal per meal.  Although acknowledging that vegetables will not add meaningful kcal to her diet, and having purchased both fresh and frozen veg's, she has been unable to make herself eat them recently.  Rayleigh is drinking little water or other fluids, and her urine is "rust-colored," suggesting the Na level of 134 mmol/L (and other labs) on 07/30/22 is probably even lower b/c of reduced blood volume.  GFR on 07/30/22 was 51, significantly down from the 78 in Oct 2023 or previous measures of >90, indicative of mildly to moderately decreased renal function. Other labs from 07/30/22 that were low:  Total protein 5.8 g/dL (nl = 6.2-9.5) AST 11 U/L (nl = 13-39) ALT 6 U/L  (nl = 7-52) RBC 4.07 (nl = 4.1-5.1)  07/30/22 labs that were high:  Hgb A1c 5.8% TSH 5.964 mlU/mL (nl = 0.45-5.33) Questionable as to potential effects of medications (Celebrex?) on labs.    Although I told Allyce the recommendations I have given her are a compromise in terms of what she really needs, I also know she is likely to become overwhelmed, and be even less able to meet recommended goals, if they are designed to meet her actual nutritional needs.  Given that she has little or no family or social support, and no means of accessing a higher level of care, this seems the best - or only feasible - approach for now.    FBG: 95-110; checking 3-5 X wk. Weight: 130.6 lb. (135.0 lb on 04/23/22)  Ht is 60".   Physical activity: Walking a bit outside as well as in the house, averaging 5,000-6,000- steps/day. Has cut back on outside walking b/c of heat.   Sleep:  4-5 hrs/night sleep per FitBit.  Progress on March 2024 behavioral goals: 3 real meals/day: 1 meal plus 2 snacks/day. Veg's&pro 10 X wk:  Veg's 0-1 X wk; protein 2-3 X day, e.g., yogurt, tuna, plant-based fake meat.  Weekly planning:  Not doing; just repeating same meals most days even though tired of repetition.    24-hr recall suggests intake of ~320 kcal:  (Up at 5 AM) B (5:15 AM)-  5 oz lite Grk yogurt (80 kcal), 2 small wheat tortillas, (50 kcal each), water, coffee with 1 pkt Stevia, 1-2 tbsp soy milk Snk (  AM)-                140 L ( PM)-  16 oz water Snk ( PM)-  --- D (6:15 PM)-  2 sugar-free Jell-Os (10 kcal)             10 Snk (8:30)-  1 tuna (2.5 oz, f-f drsng&relish) wrap on tortilla (70 kcal), 2 sugar-free Jell-Os (10 kcal), 8 oz water 170 Typical day? No.  Usually eats at mid-day, e.g., plant-based "ground beef" on 70-kcal tortilla and 2 sug-free Jell-Os.    Previous kcal intake estimates: 08/01/22:    420 03/26/22:   630  02/26/22:    765 01/15/22:    530 12/18/21:    800  11/20/21:  1560  10/23/21: <400 08/13/21:   1340  07/18/21:  1370  06/19/21:  1110  05/21/21:  1310 04/23/21:  1340  01/22/21:   720 11/17:   1160 08/29/20:    870  07/31/20:   875  06/29/20:    850  04/20/20:    700 03/23/20:             650 02/21/20   625 01/17/20   570 12/13/19      500  Intervention: Reviewed recent diet and exercise history, and again emphasized need for adequate kcal and nutrition.  For recommendations and goals, see Patient Instructions.    Follow-up: Follow-up appt in 4 weeks.   Anthony Roland,[name]

## 2022-08-26 NOTE — Patient Instructions (Signed)
-   I will send a message to ask Dr. Andrey Campanile to order a bone scan for you.   - Ask if your [facility] plan covers OsteoStrong (therapy for bone health).  If yes, you may want to get a bone scan, then start OsteoStrong the following week.    Goals remain the same:   1. At least 3 REAL MEALS per day.  A real breakfast includes a source of protein and a starchy food.  A real lunch or dinner includes protein, carb, and fruit and/or vegetable, and you determine your portion sizes.       Keep in mind your total daily protein goal is >60 grams.   2. Include vegetables and at least 20 grams of protein in 10 or more meals per week.       Ways to include veg's:      Cooked: spinach, (other greens), zucchini, yellow squash, onions, carrots, green peas, broccoli, asparagus.      Raw: tomatoes, bell pepper, mushrooms, cucumbers, peas.       Gazpacho or pureed soups.  (If using canned soup, first microwave veg's, then add soup, and reheat.)     Keep frozen vegetables on hand, which you can microwave if in a hurry, or prepare any other way.  Buy a variety of vegetables to keep on hand.  Also remember that most vegetables can be roasted, and eaten hot or leftover, cold.       Protein:  BEANS (minimum of 1 cup), dairy, fake meats, eggs, nut butters.   3. Set aside a few minutes each week to plan foods for the upcoming week, writing out a shopping list as you do this.    4. Check fasting BG at least 3 X wk, and record.   Complete the Meal Planning form provided today, and bring to your follow-up appt for review and feed-back.     Document progress on your Goals Sheet.     Follow-up appt on Monday, Aug 12 at 11 AM.

## 2022-08-27 ENCOUNTER — Other Ambulatory Visit: Payer: Self-pay | Admitting: Family Medicine

## 2022-08-27 DIAGNOSIS — I951 Orthostatic hypotension: Secondary | ICD-10-CM

## 2022-09-18 ENCOUNTER — Encounter: Payer: Self-pay | Admitting: Allergy

## 2022-09-18 ENCOUNTER — Other Ambulatory Visit: Payer: Self-pay

## 2022-09-18 ENCOUNTER — Ambulatory Visit (INDEPENDENT_AMBULATORY_CARE_PROVIDER_SITE_OTHER): Payer: 59 | Admitting: Allergy

## 2022-09-18 VITALS — BP 120/90 | HR 61 | Temp 97.3°F | Wt 132.1 lb

## 2022-09-18 DIAGNOSIS — J4530 Mild persistent asthma, uncomplicated: Secondary | ICD-10-CM | POA: Diagnosis not present

## 2022-09-18 DIAGNOSIS — H1013 Acute atopic conjunctivitis, bilateral: Secondary | ICD-10-CM

## 2022-09-18 DIAGNOSIS — J3089 Other allergic rhinitis: Secondary | ICD-10-CM

## 2022-09-18 MED ORDER — LEVOCETIRIZINE DIHYDROCHLORIDE 5 MG PO TABS: 5.0000 mg | ORAL_TABLET | Freq: Every evening | ORAL | 2 refills | Status: DC

## 2022-09-18 MED ORDER — RYALTRIS 665-25 MCG/ACT NA SUSP: 2.0000 | Freq: Two times a day (BID) | NASAL | 5 refills | Status: DC

## 2022-09-18 MED ORDER — OLOPATADINE HCL 0.2 % OP SOLN: 1.0000 [drp] | Freq: Every day | OPHTHALMIC | 2 refills | Status: DC | PRN

## 2022-09-18 MED ORDER — LEVOCETIRIZINE DIHYDROCHLORIDE 5 MG PO TABS: 5.0000 mg | ORAL_TABLET | Freq: Every evening | ORAL | 5 refills | Status: DC

## 2022-09-18 NOTE — Progress Notes (Signed)
Follow-up Note  RE: Becky [name] MRN: [id number] DOB: [birth year] Date of Office Visit: 09/18/2022   History of present illness: Becky [name] is a 67 y.o. female presenting today for follow-up of allergic rhinitis with conjunctivitis and asthma.  She was last seen in the office on 05/15/2022 by myself. She had cataract surgery almost a year ago but states she is still seeing floaters.  She is wondering if the Pataday eyedrop caused her to have the floaters. The pataday does help relieve itchy eyes when used as needed.   She continues on xyzal daily and needs refill.  The ryaltris she has used handful of times since last visit for nasal symptoms and does help.     She has used albuterol couple times a month with relief of symptoms.  She has not had any ED/UC visits or systemic steroid needs since last visit.  She continues on Symbicort 2 puffs twice a day for maintenance.    Review of systems: 10pt ROS negative unless noted above in HPI  Past medical/social/surgical/family history have been reviewed and are unchanged unless specifically indicated below.  No changes  Medication List: Current Outpatient Medications  Medication Sig Dispense Refill   Accu-Chek Softclix Lancets lancets 3 (three) times daily. for testing     albuterol (VENTOLIN HFA) 108 (90 Base) MCG/ACT inhaler Inhale 2 puffs into the lungs every 4 (four) hours as needed for wheezing or shortness of breath. 18 g 1   albuterol (VENTOLIN HFA) 108 (90 Base) MCG/ACT inhaler Inhale 2 puffs into the lungs every 6 (six) hours as needed for wheezing or shortness of breath. 18 g 1   Azelastine-Fluticasone (DYMISTA) 137-50 MCG/ACT SUSP Place 1 spray into the nose 2 (two) times daily. 23 g 5   Blood Glucose Monitoring Suppl (ACCU-CHEK GUIDE) w/Device KIT      Blood Glucose Monitoring Suppl (GLUCOCOM BLOOD GLUCOSE MONITOR) DEVI Check blood sugars three times a day     buPROPion (WELLBUTRIN XL) 150 MG 24 hr tablet Take  450 mg by mouth every morning.     DULoxetine (CYMBALTA) 60 MG capsule Take 120 mg by mouth daily.     EPIPEN 2-PAK 0.3 MG/0.3ML SOAJ injection Inject 0.3 mg into the muscle as needed for anaphylaxis.  1   Eszopiclone 3 MG TABS Take 3 mg by mouth at bedtime.     gabapentin (NEURONTIN) 300 MG capsule Take 300 mg by mouth 3 (three) times daily.     hydrochlorothiazide (MICROZIDE) 12.5 MG capsule Take 12.5 mg by mouth daily.     levocetirizine (XYZAL) 5 MG tablet Take 1 tablet (5 mg total) by mouth every evening. 30 tablet 2   levothyroxine (SYNTHROID) 25 MCG tablet Take 25 mcg by mouth daily.     nebivolol (BYSTOLIC) 10 MG tablet Take 5 mg by mouth daily.     Olopatadine HCl (PATADAY) 0.2 % SOLN Place 1 drop into both eyes daily as needed. 2.5 mL 2   omeprazole (PRILOSEC) 40 MG capsule Take 40 mg by mouth daily.     risperiDONE (RISPERDAL) 0.5 MG tablet Take 0.5 mg by mouth at bedtime. Now taking 2mg .     SYMBICORT 160-4.5 MCG/ACT inhaler Inhale 2 puffs into the lungs 2 (two) times daily. 10.2 g 2   traZODone (DESYREL) 100 MG tablet Take 200 mg by mouth at bedtime.     celecoxib (CELEBREX) 200 MG capsule Take 200 mg by mouth 2 (two) times daily as needed. (Patient not taking:  Reported on 09/18/2022)     Current Facility-Administered Medications  Medication Dose Route Frequency Provider Last Rate Last Admin   methylPREDNISolone acetate (DEPO-MEDROL) injection 40 mg  40 mg Intra-articular Once Nestor Ramp, MD         Known medication allergies: Allergies  Allergen Reactions   Methadone Shortness Of Breath and Other (See Comments)    Chest Pain    Nitrofurantoin Shortness Of Breath and Other (See Comments)    Chest Pain    Oxycodone-Acetaminophen Shortness Of Breath and Other (See Comments)    Chest pain    Percocet [Oxycodone-Acetaminophen] Shortness Of Breath and Other (See Comments)    Chest pain   Penicillins Nausea And Vomiting, Rash, Other (See Comments) and Palpitations    Fever,  including amoxicillin  Has patient had a PCN reaction causing immediate rash, facial/tongue/throat swelling, SOB or lightheadedness with hypotension: Yes Has patient had a PCN reaction causing severe rash involving mucus membranes or skin necrosis: No Has patient had a PCN reaction that required hospitalization No Has patient had a PCN reaction occurring within the last 10 years: No If all of the above answers are "NO", then may proceed with Cephalosporin use.  Fever, including amoxicillin  Has patient had a PCN reaction causing immediate rash, facial/tongue/throat swelling, SOB or lightheadedness with hypotension: Yes Has patient had a PCN reaction causing severe rash involving mucus membranes or skin necrosis: No Has patient had a PCN reaction that required hospitalization No Has patient had a PCN reaction occurring within the last 10 years: No If all of the above answers are "NO", then may proceed with Cephalosporin use. Fever, including amoxicillin    Amoxicillin Rash   Aspirin Other (See Comments)    stomach pain stomach pain stomach pain   Clarithromycin Rash and Other (See Comments)    Fever    Codeine Nausea And Vomiting and Other (See Comments)   Hydrocodone-Acetaminophen Itching    Premeditate benadryl   Morphine Nausea And Vomiting and Other (See Comments)   Ms Contin [Morphine Sulfate] Nausea And Vomiting    Morphine IR and ER   Propoxyphene Nausea Only    Sick to stomach     Physical examination: Blood pressure (!) 120/90, pulse 61, temperature (!) 97.3 F (36.3 C), weight 132 lb 1.6 oz (59.9 kg), SpO2 96%.  General: Alert, interactive, in no acute distress. HEENT: PERRLA, TMs pearly gray, turbinates non-edematous without discharge, post-pharynx non erythematous. Neck: Supple without lymphadenopathy. Lungs: Clear to auscultation without wheezing, rhonchi or rales. {no increased work of breathing. CV: Normal S1, S2 without murmurs. Abdomen: Nondistended,  nontender. Skin: Warm and dry, without lesions or rashes. Extremities:  No clubbing, cyanosis or edema. Neuro:   Grossly intact.  Diagnositics/Labs:  Spirometry: FEV1: 1.71L 84%, FVC: 2.19L 85%, ratio consistent with effective pattern  Assessment and plan: Allergic rhinitis with conjunctivitis Mild persistent asthma - under good control at this time  - Continue avoidance measures for grass and weed pollens. - Continue Xyzal (levocetirizine) 5mg  tablet once daily.  - Continue Ryaltris (olopatadine/mometasone) two sprays per nostril 2 times daily at this time.    - Continue Pataday (olopatadine) one drop per eye daily as needed for itchy/watery eyes.   - Recommend nasal saline rinses 1-7 times a week to remove allergens from the nasal cavities as well as help with mucous clearance (this is especially helpful to do before the nasal sprays are given).  Kit provided.  -Have access to albuterol inhaler 2 puffs every  4-6 hours as needed for cough/wheeze/shortness of breath/chest tightness.  May use 15-20 minutes prior to activity.   Monitor frequency of use.   -Continue Symbicort 2 puffs twice a day at this time  Asthma control goals:  Full participation in all desired activities (may need albuterol before activity) Albuterol use two time or less a week on average (not counting use with activity) Cough interfering with sleep two time or less a month Oral steroids no more than once a year No hospitalizations  Follow-up in 6 months or sooner if needed  I appreciate the opportunity to take part in Becky [name]'s care. Please do not hesitate to contact me with questions.  Sincerely,   Margo Aye, MD Allergy/Immunology [facility]

## 2022-09-18 NOTE — Patient Instructions (Addendum)
-   Continue avoidance measures for grass and weed pollens. - Continue Xyzal (levocetirizine) 5mg  tablet once daily.  - Continue Ryaltris (olopatadine/mometasone) two sprays per nostril 2 times daily at this time.    - Continue Pataday (olopatadine) one drop per eye daily as needed for itchy/watery eyes.   - Recommend nasal saline rinses 1-7 times a week to remove allergens from the nasal cavities as well as help with mucous clearance (this is especially helpful to do before the nasal sprays are given).  Kit provided.  -Have access to albuterol inhaler 2 puffs every 4-6 hours as needed for cough/wheeze/shortness of breath/chest tightness.  May use 15-20 minutes prior to activity.   Monitor frequency of use.   -Continue Symbicort 2 puffs twice a day at this time  Follow-up in 6 months or sooner if needed

## 2022-09-18 NOTE — Addendum Note (Signed)
Addended by: Lorrin Mais on: 09/18/2022 04:37 PM   Modules accepted: Orders

## 2022-09-23 ENCOUNTER — Encounter: Payer: Self-pay | Admitting: Family Medicine

## 2022-09-23 ENCOUNTER — Ambulatory Visit (INDEPENDENT_AMBULATORY_CARE_PROVIDER_SITE_OTHER): Payer: 59 | Admitting: Family Medicine

## 2022-09-23 VITALS — Ht 60.0 in | Wt 133.4 lb

## 2022-09-23 DIAGNOSIS — F5001 Anorexia nervosa, restricting type: Secondary | ICD-10-CM | POA: Diagnosis not present

## 2022-09-23 NOTE — Progress Notes (Signed)
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  Relevant history/background: Becky [name] continues to be preoccupied with weight loss, and is distressed at the weight she has gained in the past several months (she was <100 lb in March 2023).  She discontinued MNT from March to June 2024, citing an inability to follow through with dietary recommendations.  Recognizing the health consequences of malnutrition, she is motivated to try again, although remains dubious that she can ever eat a normal amount of food without gaining weight.  She continues to see therapist [facility] weekly.   Assessment: Becky [name] has struggled to follow dietary recommendations.  She has added some fruits and veg's as well as protein foods to her diet, although per FitBit information, she averaged a daily intake of 540 kcal (previous month was 340-478) and 52 g protein (previous month was 35-49) in the past month.  Still conscious of kcal intake at each meal.  Water intake is limited to <12 oz/day and 0-2 c coffee each w/ 2 tbsp soy milk; urine is still dark.  Her depression has been severe with suicidal thoughts; she has been in close contact with her therapist Baltazar Apo, and psychiatrist Phillip Heal quadrupled her Risperdal to 1 mg.   She followed up with Dr. Andrey Campanile re. 07/30/22 renal function tests and elevated TSH, and he has ordered a new set of labs in Sept.  Dr. Andrey Campanile has also placed an order for a DXA scan but [facility] has not yet [facility] to schedule an appt. Becky [name]'s therapist gave her a recommendation last week to find an activity "she can look forward to."  She has taken 2 tae kwon do  classes, which has revealed poor balance.  She hopes to go to 2-3 classes per week.    07/30/22 labs that were low:  GFR on 07/30/22 was 51 (down from 78 in Oct 2023) Total protein 5.8 g/dL (nl = 7.3-2.2) AST 11 U/L (nl = 13-39) ALT 6 U/L (nl = 7-52) RBC 4.07 (nl = 4.1-5.1)  07/30/22 labs that were high:  Hgb A1c 5.8% (down from 6.3 on 12/04/21) TSH 5.964 mlU/mL (nl = 0.45-5.33) Questionable as to potential effects of medications (Celebrex?) on labs.    Reiterated from 08/26/22: Although I told Becky [name] the recommendations I have given her are a compromise in terms of what she really needs, I also know she is likely to become overwhelmed, and be even less able to meet recommended goals, if they are designed to meet her actual nutritional needs.  Given that she has little or no family or social support, and no means of accessing a higher level of care, this seems the best - or only feasible - approach for now.    FBG: average of 104 last week (previously 95-110); checking 3-5 X wk. Weight: 133.4 lb. (130.6 lb on 08/26/22)  Ht is 60".  Physical activity: Averaging 05-4998 steps/day (previously 5,000-6,000), and started 2 classes of tae kwon do last week.  Sleep:  4-5 hrs/night sleep per FitBit.  Progress on March 2024 behavioral goals: 3 real meals/day: 2 meals plus 1 snack/day. Veg's&pro 10 X wk:  Veg's 0 X wk, but has been eating 1-2 c blueberries/day; protein ~  2 X day, e.g., yogurt, tuna, plant-based "meat," and lentils.  Weekly planning:  Doing consistently. Fluids intake:  Drinking only water and coffee, total fluids of 8-24 oz per day.    24-hr recall suggests intake of ~594 kcal:  (Up at 4:30 AM) B (4:45 AM)-  5 oz Grk yogurt, 2 corn tortillas   180 Snk (6 AM)-  2 c coffee, 4 tbsp soy milk L (12 PM)-  1 c blueberries, 1 flour tortilla, 2 sugar-free Jell-Os 162 Snk ( PM)-  water D (6 PM)-  1.5 oz tuna, 2 tbsp [address] [place] drsng, relish, 1 tortilla, 1 c blueberries, 2 sugar-free  Jell-Os 252 Snk ( PM)-  --- Typical day? Yes.      Previous kcal intake estimates: 08/26/22:    320   08/01/22:    420 03/26/22:   630  02/26/22:    765 01/15/22:    530 12/18/21:    800  11/20/21:  1560  10/23/21: <400 08/13/21:  1340  07/18/21:  1370  06/19/21:  1110  05/21/21:  1310 04/23/21:  1340  01/22/21:   720 11/17:   1160 08/29/20:    870  07/31/20:   875  06/29/20:    850  04/20/20:    700 03/23/20:             650 02/21/20   625 01/17/20   570 12/13/19      500  Intervention: Reviewed recent diet and exercise history, and again emphasized need for adequate kcal and nutrition.  For recommendations and goals, see Patient Instructions.    Follow-up: Follow-up appt in 4 weeks.   Audreyana Huntsberry,[name]

## 2022-09-23 NOTE — Patient Instructions (Addendum)
Call the [facility] to schedule a DXA bone scan now that you know the order has been placed.    Before you get your labs re-done, be sure you are as well hydrated as possible (drink plenty [>48 oz/day] at least 5 consecutive days before your blood draw).  Your level of hydration can affect your kidney functions tests.    Goals:   Eat at least 3 times per day.   Include vegetables in at least 10 meals per week.    Ways to include veg's:      Cooked: spinach, (other greens), zucchini, yellow squash, onions, carrots, green peas, broccoli, asparagus.      Raw: tomatoes, bell pepper, mushrooms, cucumbers, peas.       Gazpacho or pureed soups.  (If using canned soup, first microwave veg's, then add soup, and reheat.)     Keep frozen vegetables on hand, which you can microwave if in a hurry, or prepare any other way.  Buy a variety of vegetables to keep on hand.  Also remember that most vegetables can be roasted, and eaten hot or leftover, cold.    Include at least 20 grams of protein in at least 18 meals per week.       Protein:  BEANS (minimum of 1 cup), dairy, fake meats, eggs, nut butters.          Scramble eggs with cheese.          Try pureeing beans or lentils to make them easier to eat.          Smoothie: Berries (frozen or fresh), 1 cup of Greek yogurt, flax seed meal, 1/2 banana.    4.   Check fasting BG and record at least 3 X wk.    Complete the Meal Planning form, starting with PAGE 2, and bring to your follow-up appt for review and feed-back.    Bring your Goals Sheet to follow-up appt.     Follow-up appt on Tuesday, Sept 10 at 1:30 (and October 8 at 11 AM).

## 2022-09-27 ENCOUNTER — Other Ambulatory Visit: Payer: Self-pay | Admitting: Family Medicine

## 2022-09-27 DIAGNOSIS — I951 Orthostatic hypotension: Secondary | ICD-10-CM

## 2022-10-22 ENCOUNTER — Ambulatory Visit (INDEPENDENT_AMBULATORY_CARE_PROVIDER_SITE_OTHER): Payer: 59 | Admitting: Family Medicine

## 2022-10-22 DIAGNOSIS — F5001 Anorexia nervosa, restricting type: Secondary | ICD-10-CM | POA: Diagnosis not present

## 2022-10-22 NOTE — Patient Instructions (Signed)
Consider searching for a white noise app for your phone to use at night in place of the TV noise.  Or try the calming music you've used before.    Call [facility] to ask about getting a DXA scan appt.   Call Dr. Tawana Scale office to schedule re-check of labs.  (Confirm which labs need to be rescheduled.    07/30/22 labs that were low:  GFR on 07/30/22 was 51 (down from 78 in Oct 2023) Total protein 5.8 g/dL (nl = 0.6-2.6) AST 11 U/L (nl = 13-39) ALT 6 U/L (nl = 7-52) RBC 4.07 (nl = 4.1-5.1)  07/30/22 labs that were high:  Hgb A1c 5.8% (down from 6.3 on 12/04/21) TSH 5.964 mlU/mL (nl = 0.45-5.33)  Behavioral Goals:   Eat at least 3 real meals per day.         A REAL breakfast includes at least some protein and some starch (fruit optional).         A REAL lunch or dinner includes at least some protein, some starch, and vegetables and/or fruit.        (OR: Would you serve this to a guest in your home, and call it a meal?)  Include vegetables in at least 10 meals per week.    Ways to include veg's:      Cooked: spinach, (other greens), zucchini, yellow squash, onions, carrots, green peas, broccoli, asparagus.      Raw: tomatoes, bell pepper, mushrooms, cucumbers, peas.       Gazpacho or pureed soups.  (If using canned soup, first microwave veg's, then add soup, and reheat.)     Keep frozen vegetables on hand, which you can microwave if in a hurry, or prepare any other way.  Buy a variety of vegetables to keep on hand.  Also remember that most vegetables can be roasted, and eaten hot or leftover, cold.    Include at least 20 grams of protein per meal.       Protein:  BEANS (minimum of 1 cup), dairy, fake meats, eggs, nut butters.          Scramble eggs with cheese.                     Try pureeing beans or lentils to make them easier to eat.                     Smoothie: Berries (frozen or fresh), 1 cup of Greek yogurt, flax seed meal, 1/2 banana.    4.   Check fasting BG and record at  least 3 X wk.   Complete the Meal Planning form, starting with PAGE 2, and bring to your follow-up appt for review and feed-back.    Follow-up appt on Tuesday, October 8 at 11 AM (and on Monday, November 4 at 11 AM).

## 2022-10-22 NOTE — Progress Notes (Signed)
Medical Nutrition Therapy (MNT)  PCP: Benedetto Goad, MD, 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  Relevant history/background: Becky [name] continues to be preoccupied with weight loss, and is distressed at the weight she has gained in the past several months (she was <100 lb in March 2023).  She discontinued MNT from March to June 2024, citing an inability to follow through with dietary recommendations.  Recognizing the health consequences of malnutrition, she is motivated to try again, although remains dubious that she can ever eat a normal amount of food without gaining weight.  She continues to see therapist Becky [name] weekly.   Assessment: Becky [name] is still following kcal intake at each meal with FitBit use.  She averaged a daily intake of 610 kcal (previous month was 540) and 61.5 g protein (previous month was 52) in the past month.   Water intake has not increased much; usually 24 oz/day plus 1 c coffee w/ 2 tbsp soy milk; urine is still dark.   Depression has continued to be severe; seeing therapist Baltazar Apo at least weekly. Scheduled activities "she can look forward to" as recommended by therapist Baltazar Apo:  Tae kwon do classes.   Becky [name] forgot to complete the Meal Planning form provided at last appt.    [facility] for Feb 2025.    07/30/22 labs that were low:  GFR on 07/30/22 was 51 (down from 78 in Oct 2023) Total protein 5.8 g/dL (nl = 4.0-9.8) AST 11 U/L (nl = 13-39) ALT 6 U/L (nl = 7-52)   RBC 4.07 (nl = 4.1-5.1)  07/30/22 labs that were high:  Hgb A1c 5.8% (down from 6.3 on 12/04/21) TSH 5.964 mlU/mL (nl = 0.45-5.33) Questionable as to potential effects of  medications (Celebrex?) on labs.    Reiterated from 08/26/22: Although I told Becky [name] the recommendations I have given her are a compromise in terms of what she really needs, I also know she is likely to become overwhelmed, and be even less able to meet recommended goals, if they are designed to meet her actual nutritional needs.  Given that she has little or no family or social support, and no means of accessing a higher level of care, this seems the best - or only feasible - approach for now.  She has in fact increased energy and protein intake some since June.    FBG: average of 98 to low-100s last week (previously 95-110); checking 3-5 X wk. Weight: 133.4 lb. (133.4 lb on 09/23/22)  Ht is 60".   Physical activity: Averaging at least 5000 steps/day (walking inside) and has been taking 2 classes of tae kwon do 2 X wk, but has had left knee pain following each class.  Sleep:  4-5 hrs/night sleep per FitBit, although she is waking less frequently.  Taking risperdal 2 mg/night.  Usually going to bed ~10 PM, and gets up at 4 to 4:30 AM.  Sleeps with the TV on all night.    Progress on behavioral goals: Eat 3 X day:  Getting 2 real meals plus 0-1 snack/day (sometimes 3 meals). Veg's&pro 10 X wk:  Veg's 6.5 X wk; protein at 16 meals/wk (averaged 61.5 g/day) X day. Weekly planning:  Doing consistently. Fluids intake:  Drinking only water and coffee, total fluids of 36 oz per day.    24-hr recall suggests intake of 70 g pro and 591 kcal:  (Up at 4:30 AM) B (6 AM)-  5 oz Light&Fit Grk yogurt (12 g pro; 80 kcal), 2 5 1/2" flour tortillas (80 kcal) 22 g total Snk (8 AM)-  1 c coffee, 2 tbsp soy milk L (12:15 PM)-  5 oz Light&Fit Grk yogurt (12 g pro; 80 kcal), 1/2 c blueberries, 1 large flr tortilla, 2 sug-free Jell-Os (18 g pro; 191 kcal) Snk ( PM)-  water D (6:15 PM)-  2.5 oz tuna, 1/2 c mushrooms, 1/2 c tomatoes, 1-2 tbsp fat-free drsng, 1 large flr tortilla (60 kcal), 2 sug-free Jell-Os, water (30 g  pro; 220 kcal) - Went to [employer] from 7 to 7:40 PM -  Snk (8 PM)-  water Typical day? Yes.     Previous kcal intake estimates: 09/23/22:   594  08/26/22:    320   08/01/22:    420 03/26/22:   630  02/26/22:    765 01/15/22:    530 12/18/21:    800  11/20/21:  1560  10/23/21: <400 08/13/21:  1340  07/18/21:  1370  06/19/21:  1110  05/21/21:  1310 04/23/21:  1340  01/22/21:   720 11/17:   1160 08/29/20:    870  07/31/20:   875  06/29/20:    850  04/20/20:    700 03/23/20:             650 02/21/20   625 01/17/20   570 12/13/19      500  Intervention: Reviewed recent diet and exercise history, and reminded patient that malnutrition is likely to make both depression and anxiety worse.  For recommendations and goals, see Patient Instructions.    Follow-up: Follow-up appt in 4 weeks.   Shakeeta Godette,[name]

## 2022-11-01 ENCOUNTER — Encounter: Payer: Self-pay | Admitting: Family Medicine

## 2022-11-01 ENCOUNTER — Ambulatory Visit (INDEPENDENT_AMBULATORY_CARE_PROVIDER_SITE_OTHER): Payer: 59 | Admitting: Family Medicine

## 2022-11-01 VITALS — BP 104/70 | Ht 60.0 in | Wt 130.0 lb

## 2022-11-01 DIAGNOSIS — M1712 Unilateral primary osteoarthritis, left knee: Secondary | ICD-10-CM

## 2022-11-01 DIAGNOSIS — M25562 Pain in left knee: Secondary | ICD-10-CM

## 2022-11-01 MED ORDER — METHYLPREDNISOLONE ACETATE 40 MG/ML IJ SUSP
40.0000 mg | Freq: Once | INTRAMUSCULAR | Status: AC
  Administered 2022-11-01: 40 mg via INTRA_ARTICULAR

## 2022-11-01 NOTE — Progress Notes (Signed)
Becky [name] - 67 y.o. female MRN [id number]  Date of birth: [birth year]    SUBJECTIVE:      Chief Complaint:/ HPI:    Left knee pain last several weeks.  Her typical pain.  Mostly around the medial joint line.  No locking or giving way.  She has started taking taekwondo, becoming more active.   OBJECTIVE: BP 104/70   Ht 5' (1.524 m)   Wt 130 lb (59 kg)   BMI 25.39 kg/m   Physical Exam:  Vital signs are reviewed. GENERAL: Well-developed no acute distress KNEES: Symmetrical.  Left knee is without effusion.  Ligamentously intact to varus and valgus stress.  Lateral and medial joint line tenderness, medial greater.  Distally neurovascularly intact.  PROCEDURE: INJECTION: Patient was given informed consent, signed copy in the chart. Appropriate time out was taken. Area prepped and draped in usual sterile fashion. Ethyl chloride was  used for local anesthesia. A 21 gauge 1 1/2 inch needle was used..  1 cc of methylprednisolone 40 mg/ml plus 4 cc of 1% lidocaine without epinephrine was injected into the left knee using a(n) anterior medial approach.   The patient tolerated the procedure well. There were no complications. Post procedure instructions were given.   ASSESSMENT & PLAN:  See problem based charting & AVS for pt instructions. Arthritis of left knee She has done really well considering her arthritis and her new activity.  Corticosteroid injection discussed with her and she decided to move forward with that today.  Applauded her increased activity.  Follow-up as needed.

## 2022-11-01 NOTE — Assessment & Plan Note (Signed)
She has done really well considering her arthritis and her new activity.  Corticosteroid injection discussed with her and she decided to move forward with that today.  Applauded her increased activity.  Follow-up as needed.

## 2022-11-07 ENCOUNTER — Other Ambulatory Visit: Payer: Self-pay | Admitting: Allergy

## 2022-11-18 NOTE — Progress Notes (Unsigned)
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  Relevant history/background: Jahnyla continues to be preoccupied with weight loss, and is distressed at the weight she has gained in the past several months (she was <100 lb in March 2023).  She discontinued MNT from March to June 2024, citing an inability to follow through with dietary recommendations.  Recognizing the health consequences of malnutrition, she is motivated to try again, although remains dubious that she can ever eat a normal amount of food without gaining weight.  She continues to see therapist [facility] weekly.   Assessment: Shamari had her L knee injected on 11/01/22, which has been very helpful.  Still following kcal intake at each meal with FitBit use.  Last month averaged a daily intake of 624 kcal (previous month was 610) and 64 g protein (previous month was 61.5).   Non-fastin glucose on 9/18 was 52, not surprising, knowing how limited her intake has been for months now.    FBG: average of ~109 last week (previously 98-100); checking 3-5 X wk. Weight: 132.8 lb. (133.4 lb on 09/23/22)  Ht is 60".  Physical activity: Averaging >5000 steps/day (walking inside) and taking tae kwon do classes 2 X wk.  (She is competing in tae kwon do this weekend.) Sleep:  ~3 hrs/night sleep per FitBit; waking frequently. Usually going to bed 9:30 PM; gets up at 4 AM.   Taking risperdal 1/2 tab at mid-day and 2 1/2 mg/night.   Goes to sleep with TV on b/c it helps her not notice tinnitus, but it turns off automatically at some point.    Meal plan form: Completed page 2 (preferences) from which we determined 5 dinner meals.     Water intake:  Usually 30 oz/day plus 1 c coffee w/ 2 tbsp soy milk; urine is still dark.   Depression has continued to be severe; seeing therapist Baltazar Apo at least weekly. Scheduled activities "she can look forward to" as recommended by therapist Baltazar Apo:  Doing 2 Tae kwon do classes/week.    Progress on behavioral goals: Eat 3 X day:  3 real meals plus 0-1 snack most days. Veg's 10 X wk:  Veg's 0 X wk. 20 g pro/meal:  Meeting goal 16 meals/week Fluids intake:  Drinking only water and coffee, total fluids of 30 oz most days.    24-hr recall suggests intake of 575 kcal and 66 g protein:  (Up at 4 AM)              Kcal   B (4:30 AM)-  5 oz Light&Fit Grk yogurt (80 kcal), 2 small tortillas (100 kcal), 1 c coffee, 2 tbsp soy milk  Snk ( AM)-               180 kcal  L (12:15 PM)-  5 oz Light&Fit Grk yogurt (80 kcal), 1 c blueberries (80 kcal), 1 small tortilla (50 kcal), 3 sug-free Jell-Os (15 kcal)  Snk ( PM)-               225   D (6:15 PM)-  2.5 oz tuna, 1 tbsp relish, 2 tbsp fat-free 1000-Isl drsng, 1 large tortilla (60 kcal), 2 sug-free Jell-Os (10 kcal)  Snk ( PM)-               170 Typical day? Yes.   32 oz water all day.    Previous kcal intake estimates: 10/22/22:    591  09/23/22:   594  08/26/22:    320   08/01/22:    420 03/26/22:   630  02/26/22:    765 01/15/22:    530 12/18/21:    800  11/20/21:  1560  10/23/21: <400 08/13/21:  1340  07/18/21:  1370  06/19/21:  1110  05/21/21:  1310 04/23/21:  1340  01/22/21:   720 11/17:   1160 08/29/20:    870  07/31/20:   875  06/29/20:    850  04/20/20:    700 03/23/20:             650 02/21/20   625 01/17/20   570 12/13/19      500  Intervention: Reviewed recent diet and exercise history, and reminded patient that malnutrition is likely to worsen depression and insomnia.  For recommendations and goals, see Patient Instructions.    Follow-up: Follow-up appt in 4 weeks.   Gerrianne Aydelott,[name]

## 2022-11-19 ENCOUNTER — Ambulatory Visit (INDEPENDENT_AMBULATORY_CARE_PROVIDER_SITE_OTHER): Payer: 59 | Admitting: Family Medicine

## 2022-11-19 VITALS — Ht 60.0 in | Wt 132.8 lb

## 2022-11-19 DIAGNOSIS — F50012 Anorexia nervosa, restricting type, severe: Secondary | ICD-10-CM

## 2022-11-19 NOTE — Patient Instructions (Signed)
Call [facility] to see if you can get a [facility] scan earlier than [facility].    Your blood glucose of 52 on 9/18 reflects your limited food intake, and is probably directly associated with your worsening depression over the past couple of months.  Your poor sleep may also be related to inadequate intake.  Furthermore, your inadequate energy intake means your protein needs are elevated, and you are almost guaranteed to be losing muscle mass, which means lower metabolic rate.    Restrict food >>>> Feel bad >>>> Restrict food more >>>> Feel worse (and make poor decisions for self-care.    Behavioral Goals:   Eat at least 3 real meals per day.         A REAL breakfast includes at least some protein and some starch (fruit optional).         A REAL lunch or dinner includes at least some protein, some starch, and vegetables and/or fruit.        (OR: Would you serve this to a guest in your home, and call it a meal?)  Include vegetables in at least 10 meals per week.    Ways to include veg's:      Cooked: spinach, (other greens), zucchini, yellow squash, onions, carrots, green peas, broccoli, asparagus.      Raw: tomatoes, bell pepper, mushrooms, cucumbers, peas.       Gazpacho or pureed soups.  (If using canned soup, first microwave veg's, then add soup, and reheat.)     Keep frozen vegetables on hand, which you can microwave if in a hurry, or prepare any other way.  Buy a variety of vegetables to keep on hand.  Also remember that most vegetables can be roasted, and eaten hot or leftover, cold.    Include at least 20 grams of protein per meal.       Protein:  BEANS (minimum of 1 cup), dairy, fake meats, eggs, nut butters.          Scramble eggs with cheese.                     Try pureeing beans or lentils to make them easier to eat.                     Smoothie: Berries (frozen or fresh), 1 cup of Greek yogurt, flax seed meal, 1/2 banana.    4.   Check fasting BG and record at least 3 X wk.    Complete the last 2 dinners on your Meal Planning form and bring to your follow-up appt for review and feed-back.    Follow-up appt on Monday, Nov 4 at 11 AM (and Tuesday, Dec 3 at 11 AM).

## 2022-12-16 ENCOUNTER — Ambulatory Visit (INDEPENDENT_AMBULATORY_CARE_PROVIDER_SITE_OTHER): Payer: 59 | Admitting: Family Medicine

## 2022-12-16 VITALS — Ht 60.0 in | Wt 132.4 lb

## 2022-12-16 DIAGNOSIS — F50012 Anorexia nervosa, restricting type, severe: Secondary | ICD-10-CM

## 2022-12-16 NOTE — Progress Notes (Signed)
Medical Nutrition Therapy (MNT)  PCP: Benedetto Goad, MD, [facility] Gastroenterologist: Stan Head, MD Therapist: Baltazar Apo, APRN, MSN, CS  Psychiatrist: Phillip Heal, MD  Appt start time: 1100 end time: 1200 (60 min)  Reason for visit: Referred for Medical Nutrition Therapy (MNT) related to anorexia nervosa, restricting type (F50.01).  Relevant history/background: Becky [name] continues to be preoccupied with weight loss, and is distressed at the weight she has gained in the past several months (she was <100 lb in March 2023).  She discontinued MNT from March to June 2024, citing an inability to follow through with dietary recommendations.  Recognizing the health consequences of malnutrition, she is motivated to try again, although remains dubious that she can ever eat a normal amount of food without gaining weight.  She continues to see therapist [facility] weekly.   Assessment: Becky [name] got a first- and second-place in her Becky [name] competition a few weeks ago.  She has a graduation from white to yellow belt in early Dec.  Per FitBit, last month her averaged daily intake was 612 (587-643) kcal (previous month was 575) and 58 g (46-65) protein (previous month was 66).  Becky [name] said she thinks about food all day long.  We discussed why this is likely happening (see Patient Instrxns).  Becky [name]'s DXA scan was positive for osteopenia.  FBG: average of 100 last week (previously 109); checking 2-3 X wk. Weight: 132.4 lb. (132.8 lb on 11/19/22)  Ht is 60".  Physical activity: Walking sporadically >7,000 steps/day (had one week of 10,000 steps/day), and taking tae kwon do classes 2 X wk.  Sleep:  Still ~3 hrs/night sleep per FitBit; waking frequently. Usually going to bed 9:30 PM; gets up at 4 AM.   Dr. Madaline Guthrie recommended taking risperdal 3 mg all at bedtime to see if this may help, which she started just last night, when she also changed to taking all of her gabapentin  (600 mg) at 7 PM.    Use of planned meals: Has not used, but did design 2 more meals (chili & salad & saltines; pasta, veg "ground beef" sauce & salad.    Water intake:  Usually 38 oz/day plus 1 c coffee w/ 2 tbsp soy milk.   Depression has continued to be severe; seeing therapist Baltazar Apo at least weekly. Scheduled activities "she can look forward to" as recommended by therapist Baltazar Apo:  Doing 2 Tae kwon do classes/week.    Progress on behavioral goals: Eat 3 X day:  2 real meals plus 0-1 snack most days (3 meals 1 X last month). Veg's 10 X wk:  Veg's 0 X wk. 20 g pro/meal:  Meeting goal 13-14 meals/week (9-18/wk)  24-hr recall suggests intake of ~640 kcal:  (Up at 4:30 AM) B (4:30 AM)-  5 oz non-fat Grk yogurt (80, 12 g pro), 1 large and 1 small tortilla (90 kcal; 7 g pro), 1 c coffee, splash soy milk Snk ( AM)-  --- L (12 PM)-  2.5 oz tuna, 1 tortilla, 1 tbsp relish, fat-free drsng, 1 c blueberries, water (230 kcal; 23 g pro)  Snk ( PM)-  --- D (6 PM)-  2.5 oz tuna, 1 tortilla, 1 tbsp relish, fat-free drsng, 1 c blueberries, water, 2 sug-free Jell-Os (240 kcal; 25 g pro)  Snk (7 PM)-  1 c coffee, splash soy milk Typical day? Yes.   Total water intake 16 oz  Previous kcal intake estimates: 11/19/22:   575  10/22/22:  591  09/23/22:   594  08/26/22:    320   08/01/22:    420 03/26/22:   630  02/26/22:    765 01/15/22:    530 12/18/21:    800  11/20/21:  1560  10/23/21: <400 08/13/21:  1340  07/18/21:  1370  06/19/21:  1110  05/21/21:  1310 04/23/21:  1340  01/22/21:   720 11/17:   1160 08/29/20:    870  07/31/20:   875  06/29/20:    850  04/20/20:    700 03/23/20:             650 02/21/20   625 01/17/20   570 12/13/19      500  Intervention: Reviewed recent diet and exercise history, and reminded patient that malnutrition is likely to worsen depression and insomnia.  For recommendations and goals, see Patient Instructions.    Follow-up: Follow-up appt in 4 weeks.    Becky [name],Becky [name]

## 2022-12-16 NOTE — Patient Instructions (Addendum)
You have said:   You're thinking about food most of the day every day.   And that you are tightly controlling your food intake.    At first observation, it certainly appears that you are exerting strong control over food intake; however, CONTROL implies that you are making a choice, when in reality, your food restriction is more compulsion vs. choice.    Why you are plagued by food thoughts:  You are documenting daily food intake and activity.  (You were made to eat certain foods (meat and veg's) as a child, so rejecting those foods is somewhat of a rebellion.)   You feel judged (especially by Santina Evans) by what you choose to eat.   Your brain is sending out signals that you need more to eat.  This is a simple survival trait that is a natural instinct, inherent to human nature.    Our belief systems are sometimes appropriately called our BS b/c they are so often based on faulty premises.   Suggestion: Make a list of what you consider to be your core beliefs that contribute to food restriction.  Include on your list: The belief, where it might have come from (who said or showed you this), and how it affects your thinking and behavior.    Meanwhile, here are your behavioral goals:  Behavioral Goals:   Eat at least 3 real meals per day.         A REAL breakfast includes at least some protein and some starch (fruit optional).         A REAL lunch or dinner includes at least some protein, some starch, and vegetables and/or fruit.        (OR: Would you serve this to a guest in your home, and call it a meal?) Include vegetables in at least 10 meals per week.    Ways to include veg's:      Cooked: spinach, (other greens), zucchini, yellow squash, onions, carrots, green peas, broccoli, asparagus.      Raw: tomatoes, bell pepper, mushrooms, cucumbers, peas.       Gazpacho or pureed soups.  (If using canned soup, first microwave veg's, then add soup, and reheat.)     Keep frozen vegetables on hand,  which you can microwave if in a hurry, or prepare any other way.  Buy a variety of vegetables to keep on hand.  Also remember that most vegetables can be roasted, and eaten hot or leftover, cold.   Include at least 20 grams of protein per meal.       Protein:  BEANS (minimum of 1 cup), dairy, fake meats, eggs, nut butters.          Scramble eggs with cheese.                     Try pureeing beans or lentils to make them easier to eat.                     Smoothie: Berries (frozen or fresh), 1 cup of Greek yogurt, flax seed meal, 1/2 banana.   4.   Check fasting BG and record at least 3 X wk.   Complete the last 2 dinners on your Meal Planning form and bring to your follow-up appt for review and feed-back.     Follow-up appt on Tuesday, Dec 3 at 11 AM.

## 2022-12-19 ENCOUNTER — Other Ambulatory Visit: Payer: Self-pay | Admitting: Allergy

## 2023-01-13 NOTE — Progress Notes (Unsigned)
Medical Nutrition Therapy (MNT)  PCP: Benedetto Goad, MD, [facility] Gastroenterologist: Stan Head, MD Therapist: Baltazar Apo, APRN, MSN, CS  Psychiatrist: Phillip Heal, MD  Appt start time: 1100 end time: 1200 (60 min)  Reason for visit: Referred for Medical Nutrition Therapy (MNT) related to anorexia nervosa, restricting type (F50.01).  Relevant history/background: Becky [name] continues to be preoccupied with weight loss, and is distressed at the weight she has gained in the past several months (she was <100 lb in March 2023).  She discontinued MNT from March to June 2024, citing an inability to follow through with dietary recommendations.  Recognizing the health consequences of malnutrition, she is motivated to try again, although remains dubious that she can ever eat a normal amount of food without gaining weight.  She continues to see therapist Becky [name] weekly.   Assessment: Becky [name] broke her R foot in Tae Kwon Do last week, and will be in an orthopedic boot for 8 weeks.  Per FitBit, last month she averaged a daily intake of 547 kcal (539-603) (previous month was 612) and 54 g (50-58) protein (previous month was 58).    FBG: average of 105 last week (previously 100); still checking 2-3 X wk. Weight: No weight check today b/c patient is wearing orthopedic boot. (132.4 lb on 12/16/22)  Ht is 60".   Physical activity: Prior to 11/26/24foot injury, average steps was 6,200 steps/day.  She has been advised to keep her foot elevated  Sleep:  ~4-5 hrs/night sleep per FitBit; waking frequently. Usually going to bed 9:30 PM; gets up at 4 AM.   Dr. Madaline [name] recommended taking risperdal 3 mg all at bedtime to see if this may help, which she started just last night, when she also changed to taking all of her gabapentin (600 mg) at 7 PM.  [name] could not tolerate Dr. Corinna [name] recommendation to increase her trazedone to 250 (from 200).      Water intake:  Usually 32  oz/day plus 1 c coffee w/ 2 tbsp soy milk.   Depression has continued to be severe; seeing therapist [facility] 1-2 X week. Scheduled activities "she can look forward to" as recommended by therapist Becky [name]:  Cannot now do Becky [place] Do since broken metatarsal.  Has not yet determined other enjoyable activities she can do.    Progress on behavioral goals: Eat 3 X day:  2 real meals plus 0-1 snack most days Veg's 10 X wk:  Veg's 0-1 X wk. 20 g pro/meal:  Meeting goal 14 meals/week   Previous kcal intake estimates: 12/20/22:    640  11/19/22:   575  10/22/22:    591  09/23/22:   594  08/26/22:    320   08/01/22:    420 03/26/22:   630  02/26/22:    765 01/15/22:    530 12/18/21:    800  11/20/21:  1560  10/23/21: <400 08/13/21:  1340  07/18/21:  1370  06/19/21:  1110  05/21/21:  1310 04/23/21:  1340  01/22/21:   720 11/17:   1160 08/29/20:    870  07/31/20:   875  06/29/20:    850  04/20/20:    700 03/23/20:             650 02/21/20   625 01/17/20   570 12/13/19      500  Intervention: Reviewed recent diet and exercise history, and established list of suggestions to help patient reduce negative  food thoughts.  For recommendations and goals, see Patient Instructions.    Follow-up: Follow-up [facility].   Becky [name],[name]

## 2023-01-14 ENCOUNTER — Ambulatory Visit (INDEPENDENT_AMBULATORY_CARE_PROVIDER_SITE_OTHER): Payer: 59 | Admitting: Family Medicine

## 2023-01-14 DIAGNOSIS — F50012 Anorexia nervosa, restricting type, severe: Secondary | ICD-10-CM

## 2023-01-14 NOTE — Patient Instructions (Addendum)
Schedule a follow-up appt for your foot to find out when you can start to do some kind of exercise (e.g., stationary bicycle) and when you can start rehab for it.      - Remember your foot will heal a lot faster if you nourish yourself well!  Why you are plagued by food thoughts:  You are documenting daily food intake and activity.  (You were made to eat certain foods (meat and veg's) as a child, so rejecting those foods is somewhat of a rebellion.)   You feel judged (especially by Santina Evans) by what you choose to eat.   Your brain is sending out signals that you need more to eat.  This is a simple survival trait that is a natural instinct, inherent to human nature.     Our belief systems are sometimes appropriately called our BS b/c they are so often based on faulty premises.   Suggestion: Make a list of what you consider to be your core beliefs that contribute to food restriction.  Include on your list: The belief, where it might have come from (who said or showed you this), and how it affects your thinking and behavior.    Steps you can take to decrease plaguing food thoughts: 1. Schedule worry time once daily.  2. Make a list of activities you're interested in doing.  (For example, drawing, or look into [employer] classes through [employer].) 3. Journal.  (Talk to [facility] about this, e.g., exploring childhood food "rule" or ethic; exploring feelings of being judged.) 4. Spend time working on Field seismologist.   5. Discontinue documenting food intake and physical activity.  What is something unrelated to food (or weight) that you could document?  An alternative to discontinuing documenting is to simplify it, e.g., only give yourself a check mark each time you get a real serving of a protein food (or 20 g of protein) per meal.   6. Make a list of reasons you need good nutrition. 7. Devise a "reward" system for doing any of your fulfilling activities (#2 above).    Behavioral Goals:   Eat  at least 3 real meals per day.         A REAL breakfast includes at least some protein and some starch (fruit optional).         A REAL lunch or dinner includes at least some protein, some starch, and vegetables and/or fruit.        (OR: Would you serve this to a guest in your home, and call it a meal?) Include vegetables in at least 10 meals per week.    Ways to include veg's:      Cooked: spinach, (other greens), zucchini, yellow squash, onions, carrots, green peas, broccoli, asparagus.      Raw: tomatoes, bell pepper, mushrooms, cucumbers, peas.       Gazpacho or pureed soups.  (If using canned soup, first microwave veg's, then add soup, and reheat.)     Keep frozen vegetables on hand, which you can microwave if in a hurry, or prepare any other way.  Buy a variety of vegetables to keep on hand.  Also remember that most vegetables can be roasted, and eaten hot or leftover, cold.   Include at least 20 grams of protein per meal.       Protein:  BEANS (minimum of 1 cup), dairy, fake meats, eggs, nut butters.          Scramble eggs with cheese.  Try pureeing beans or lentils to make them easier to eat.                     Smoothie: Berries (frozen or fresh), 1 cup of Greek yogurt, flax seed meal, 1/2 banana.   (4.   Not necessary to keep checking fasting BG more than a couple times a month, since you have been in such good control.)   I recommend that you follow up with [facility] at [facility], [address] [place] (4th floor) following my retirement.  They will call you to schedule an appt after receiving a physician referral.  Mirian Capuchin is a dietitian who is certified to work with eating disorders.

## 2023-03-21 ENCOUNTER — Other Ambulatory Visit: Payer: Self-pay

## 2023-03-21 ENCOUNTER — Ambulatory Visit (INDEPENDENT_AMBULATORY_CARE_PROVIDER_SITE_OTHER): Payer: 59 | Admitting: Allergy

## 2023-03-21 ENCOUNTER — Encounter: Payer: Self-pay | Admitting: Allergy

## 2023-03-21 VITALS — BP 106/58 | HR 62 | Temp 98.0°F | Resp 12 | Ht 60.24 in | Wt 143.1 lb

## 2023-03-21 DIAGNOSIS — J3089 Other allergic rhinitis: Secondary | ICD-10-CM

## 2023-03-21 DIAGNOSIS — J4530 Mild persistent asthma, uncomplicated: Secondary | ICD-10-CM | POA: Diagnosis not present

## 2023-03-21 DIAGNOSIS — H1013 Acute atopic conjunctivitis, bilateral: Secondary | ICD-10-CM

## 2023-03-21 MED ORDER — OLOPATADINE HCL 0.2 % OP SOLN: 1.0000 [drp] | Freq: Every day | OPHTHALMIC | 5 refills | Status: AC | PRN

## 2023-03-21 MED ORDER — RYALTRIS 665-25 MCG/ACT NA SUSP: 2.0000 | Freq: Two times a day (BID) | NASAL | 5 refills | Status: DC

## 2023-03-21 MED ORDER — LEVOCETIRIZINE DIHYDROCHLORIDE 5 MG PO TABS: 5.0000 mg | ORAL_TABLET | Freq: Every evening | ORAL | 1 refills | Status: AC

## 2023-03-21 MED ORDER — SYMBICORT 160-4.5 MCG/ACT IN AERO: 2.0000 | INHALATION_SPRAY | Freq: Two times a day (BID) | RESPIRATORY_TRACT | 5 refills | Status: DC

## 2023-03-21 NOTE — Patient Instructions (Addendum)
-   Continue avoidance measures for grass and weed pollens. - Continue Xyzal  (levocetirizine) 5mg  tablet once daily.  - Continue Ryaltris  (olopatadine /mometasone ) two sprays per nostril 2 times daily at this time.    - Continue Pataday  (olopatadine ) one drop per eye daily as needed for itchy/watery eyes.   - Recommend nasal saline rinses 1-7 times a week to remove allergens from the nasal cavities as well as help with mucous clearance (this is especially helpful to do before the nasal sprays are given).  -Have access to albuterol  inhaler 2 puffs every 4-6 hours as needed for cough/wheeze/shortness of breath/chest tightness.  May use 15-20 minutes prior to activity.   Monitor frequency of use.   -Continue Symbicort  160mcg 2 puffs twice a day at this time.  If control remains good at next visit will consider stepping-down therapy  Follow-up in 6 months or sooner if needed

## 2023-03-21 NOTE — Progress Notes (Signed)
 Follow-up Note  RE: Becky [name] MRN: [id number] DOB: Jul 02, 1955 Date of Office Visit: 03/21/2023   History of present illness: Becky [name] is a 68 y.o. female presenting today for follow-up of allergic rhinitis with conjunctivitis and asthma.  She was last seen in the office on 09/18/2018 myself. Discussed the use of AI scribe software for clinical note transcription with the patient, who gave verbal consent to proceed.  She has no issues with breathing and has not needed to use her rescue inhaler. She continues to use Symbicort , which effectively controls her respiratory symptoms, even during physical activities such as Taekwondo, which she practices twice a week.  She states she does not need to use albuterol  prior to activity.  Her allergies are manageable, although she anticipates potential issues with the upcoming pollen season. She uses Xyzal  (levocetirizine) and a nasal spray, though she does not use the nasal spray as frequently as recommended, despite experiencing nasal drainage. She occasionally uses eye drops and has not performed saline rinses recently.  She has been diagnosed with osteoporosis.  She sustained a foot fracture from taekwondo, which led to the diagnosis of osteoporosis.     Review of systems: 10pt ROS negative unless noted above in HPI   All other systems negative unless noted above in HPI  Past medical/social/surgical/family history have been reviewed and are unchanged unless specifically indicated below.  No changes  Medication List: Current Outpatient Medications  Medication Sig Dispense Refill   Accu-Chek Softclix Lancets lancets 3 (three) times daily. for testing     albuterol  (VENTOLIN  HFA) 108 (90 Base) MCG/ACT inhaler Inhale 2 puffs into the lungs every 4 (four) hours as needed for wheezing or shortness of breath. 18 g 1   Azelastine-Fluticasone  137-50 MCG/ACT SUSP PLACE 1 SPRAY INTO THE NOSE 2 (TWO) TIMES DAILY. 23 g 1   Blood  Glucose Monitoring Suppl (ACCU-CHEK GUIDE) w/Device KIT      Blood Glucose Monitoring Suppl (GLUCOCOM BLOOD GLUCOSE MONITOR) DEVI Check blood sugars three times a day     buPROPion  (WELLBUTRIN  XL) 150 MG 24 hr tablet Take 450 mg by mouth every morning.     cholecalciferol  (VITAMIN D3) 25 MCG (1000 UNIT) tablet Take 1,000 Units by mouth daily.     DULoxetine  (CYMBALTA ) 60 MG capsule Take 120 mg by mouth daily.     EPIPEN  2-PAK 0.3 MG/0.3ML SOAJ injection Inject 0.3 mg into the muscle as needed for anaphylaxis.  1   Eszopiclone 3 MG TABS Take 3 mg by mouth at bedtime.     gabapentin  (NEURONTIN ) 300 MG capsule Take 300 mg by mouth 2 (two) times daily.     hydrochlorothiazide  (MICROZIDE ) 12.5 MG capsule Take 12.5 mg by mouth daily.     ibuprofen (ADVIL) 200 MG tablet Take 800 mg by mouth every 6 (six) hours as needed.     iron polysaccharides (NIFEREX) 150 MG capsule Take 150 mg by mouth daily.     levocetirizine (XYZAL ) 5 MG tablet TAKE 1 TABLET BY MOUTH EVERY DAY IN THE EVENING 90 tablet 1   levothyroxine (SYNTHROID) 25 MCG tablet Take 25 mcg by mouth daily.     nebivolol  (BYSTOLIC ) 10 MG tablet Take 5 mg by mouth daily.     Olopatadine  HCl (PATADAY ) 0.2 % SOLN Place 1 drop into both eyes daily as needed. 2.5 mL 2   Olopatadine -Mometasone  (RYALTRIS ) 665-25 MCG/ACT SUSP Place 2 sprays into the nose 2 (two) times daily. 29 g 5  omeprazole  (PRILOSEC) 40 MG capsule Take 40 mg by mouth daily.     risperiDONE (RISPERDAL) 1 MG tablet Take 1 mg by mouth 3 (three) times daily.     SYMBICORT  160-4.5 MCG/ACT inhaler INHALE 2 PUFFS INTO THE LUNGS TWICE A DAY 10.2 each 1   traZODone  (DESYREL ) 100 MG tablet Take 200 mg by mouth at bedtime.     zolpidem  (AMBIEN ) 10 MG tablet Take 10 mg by mouth at bedtime as needed.     risperiDONE (RISPERDAL) 0.5 MG tablet Take 2 mg by mouth at bedtime. Now taking 2mg .     Current Facility-Administered Medications  Medication Dose Route Frequency Provider Last Rate Last  Admin   methylPREDNISolone  acetate (DEPO-MEDROL ) injection 40 mg  40 mg Intra-articular Once Neal, Sara L, MD         Known medication allergies: Allergies  Allergen Reactions   Methadone Shortness Of Breath and Other (See Comments)    Chest Pain    Nitrofurantoin Shortness Of Breath and Other (See Comments)    Chest Pain    Oxycodone-Acetaminophen  Shortness Of Breath and Other (See Comments)    Chest pain    Percocet [Oxycodone-Acetaminophen ] Shortness Of Breath and Other (See Comments)    Chest pain   Penicillins Nausea And Vomiting, Rash, Other (See Comments) and Palpitations    Fever, including amoxicillin  Has patient had a PCN reaction causing immediate rash, facial/tongue/throat swelling, SOB or lightheadedness with hypotension: Yes Has patient had a PCN reaction causing severe rash involving mucus membranes or skin necrosis: No Has patient had a PCN reaction that required hospitalization No Has patient had a PCN reaction occurring within the last 10 years: No If all of the above answers are NO, then may proceed with Cephalosporin use.  Fever, including amoxicillin  Has patient had a PCN reaction causing immediate rash, facial/tongue/throat swelling, SOB or lightheadedness with hypotension: Yes Has patient had a PCN reaction causing severe rash involving mucus membranes or skin necrosis: No Has patient had a PCN reaction that required hospitalization No Has patient had a PCN reaction occurring within the last 10 years: No If all of the above answers are NO, then may proceed with Cephalosporin use. Fever, including amoxicillin    Amoxicillin Rash   Aspirin Other (See Comments)    stomach pain stomach pain stomach pain   Clarithromycin Rash and Other (See Comments)    Fever    Codeine Nausea And Vomiting and Other (See Comments)   Hydrocodone -Acetaminophen  Itching    Premeditate benadryl    Morphine Nausea And Vomiting and Other (See Comments)   Ms Contin [Morphine  Sulfate] Nausea And Vomiting    Morphine IR and ER   Propoxyphene Nausea Only    Sick to stomach     Physical examination: Blood pressure (!) 106/58, pulse 62, temperature 98 F (36.7 C), temperature source Temporal, resp. rate 12, height 5' 0.24 (1.53 m), weight 143 lb 1.6 oz (64.9 kg), SpO2 95%.  General: Alert, interactive, in no acute distress. HEENT: PERRLA, TMs pearly gray, turbinates non-edematous without discharge, post-pharynx non erythematous. Neck: Supple without lymphadenopathy. Lungs: Clear to auscultation without wheezing, rhonchi or rales. {no increased work of breathing. CV: Normal S1, S2 without murmurs. Abdomen: Nondistended, nontender. Skin: Warm and dry, without lesions or rashes. Extremities:  No clubbing, cyanosis or edema. Neuro:   Grossly intact.  Diagnositics/Labs:  Spirometry: FEV1: 1.46L 72%, FVC: 2.11L 82% predicted.  Essentially normal for age/demographic  Assessment and plan: Allergic rhinitis with conjunctivitis Asthma,  persistent  - Continue avoidance measures for grass and weed pollens. - Continue Xyzal  (levocetirizine) 5mg  tablet once daily.  - Continue Ryaltris  (olopatadine /mometasone ) two sprays per nostril 2 times daily at this time.    - Continue Pataday  (olopatadine ) one drop per eye daily as needed for itchy/watery eyes.   - Recommend nasal saline rinses 1-7 times a week to remove allergens from the nasal cavities as well as help with mucous clearance (this is especially helpful to do before the nasal sprays are given).  -Have access to albuterol  inhaler 2 puffs every 4-6 hours as needed for cough/wheeze/shortness of breath/chest tightness.  May use 15-20 minutes prior to activity.   Monitor frequency of use.   -Continue Symbicort  160mcg 2 puffs twice a day at this time.  If control remains good at next visit will consider stepping-down therapy  Follow-up in 6 months or sooner if needed   I appreciate the opportunity to take part in  Adrianna's care. Please do not hesitate to contact me with questions.  Sincerely,   Danita Brain, MD Allergy /Immunology [facility]

## 2023-04-03 ENCOUNTER — Other Ambulatory Visit: Payer: 59

## 2023-05-30 ENCOUNTER — Other Ambulatory Visit: Payer: Self-pay | Admitting: Allergy

## 2023-06-24 LAB — HEMOGLOBIN A1C: A1c: 5.7

## 2023-06-25 LAB — TSH: TSH: 1.96 (ref 0.41–5.90)

## 2023-07-22 ENCOUNTER — Encounter: Attending: Endocrinology | Admitting: Registered"

## 2023-07-22 ENCOUNTER — Encounter: Payer: Self-pay | Admitting: Registered"

## 2023-07-22 DIAGNOSIS — F509 Eating disorder, unspecified: Secondary | ICD-10-CM | POA: Insufficient documentation

## 2023-07-22 DIAGNOSIS — E119 Type 2 diabetes mellitus without complications: Secondary | ICD-10-CM | POA: Diagnosis present

## 2023-07-22 NOTE — Patient Instructions (Signed)
-   Continue to have 3 meals a day with at least 3 food groups: protein, vegetables, and grains.   - Be intentional about having vegetables with lunch and dinner.   - Aim to check fasting blood glucose daily. Record values.

## 2023-07-22 NOTE — Progress Notes (Unsigned)
 Appointment start time: 5:04  Appointment end time: 5:58  Patient was seen on 07/22/2023 for nutrition counseling pertaining to disordered eating  Primary care provider: Rosslyn Coons, MD Therapist: Oneal Bienenstock (sees weekly, in-person)  ROI: will complete at next appt Any other medical team members: Dr. Viveca Grist (psychiatrist),    Assessment  Pt states she has type 2 diabetes and worked with previous RD on this. Reports she was checking FBS (130-140 mg/dL) daily. Reports she worked with previous RD for 20+ years.   States she has anorexia. States she has been gaining weight and needs to lose weight. States she keeps a record of everything she eats for herself and medical providers. States her weight has a lot to do with her depression. States anorexia made her lose all of her teeth which limits her eating options. States she is looking to have implants done to help make things better.   States she has a made a promise with her therapist that she would talk to her before doing anything to end her life.   States she lives alone except with dog.    Eating history: Length of time: 20+ years Previous treatments: outpatient care with previous RD until RD retired Goals for RD meetings: improve aid reflux, constipation, dizziness  Weight history:  Highest weight:    Lowest weight:  Most consistent weight:   What would you like to weigh: 104 lbs  How has weight changed in the past year:   Medical Information:  Changes in hair, skin, nails since ED started: no Chewing/swallowing difficulties: no Reflux or heartburn: yes, takes medication  Trouble with teeth: no Constipation, diarrhea: yes, constipation has BM 1-2x/week Dizziness/lightheadedness: every once in a while Headaches/body aches: no Heart racing/chest pain: no Mood: depressed Sleep: sleeps 4-5 hrs/night Focus/concentration: no Cold intolerance: no Vision changes: yes  Mental health diagnosis: AN   Dietary  assessment: A typical day consists of 2 meals and 1 snacks  Safe foods include: tuna, [place] yogurt, flour tortilla, peanut butter, bread, quiche, veggie burger crumbles, milk, cheese, squash, green beans, veggie casserole, bananas, strawberries, potatoes, oatmeal, soy protein, beans  Avoided foods include: apples, meat other than tuna,   24 hour recall:  B: [place] yogurt + tortilla S L S: tortilla + cup of strawberries D: tuna +  tortilla + cup of strawberries S  Beverages: water (3*16.9 oz; 51 oz)   What Methods Do You Use To Control Your Weight (Compensatory behaviors)?           Restricting (calories, fat, carbs)  Estimated energy intake: 600-700 kcal  Estimated energy needs: 1600-1800 kcal 200-225 g CHO 120-135 g pro 36-40 g fat  Nutrition Diagnosis: NB-1.5 Disordered eating pattern As related to anorexia nervosa.  As evidenced by restriction of food.  Intervention/Goals: Pt was educated and counseled on eating to nourish the body, ways to improve nourishment, and meal/snack  planning. Pt agreed with goals listed. Goals: - Continue to have 3 meals a day with at least 3 food groups: protein, vegetables, and grains.  - Be intentional about having vegetables with lunch and dinner.  - Aim to check fasting blood glucose daily. Record values.   Meal plan:    3 meals    1-3 snacks  Monitoring and Evaluation: Patient will follow up in 3 weeks.

## 2023-07-30 ENCOUNTER — Ambulatory Visit: Admitting: Registered"

## 2023-08-01 ENCOUNTER — Encounter: Payer: Self-pay | Admitting: Endocrinology

## 2023-08-08 ENCOUNTER — Other Ambulatory Visit: Payer: Self-pay | Admitting: Allergy

## 2023-08-11 NOTE — Telephone Encounter (Signed)
 Refill for Dymista x 1 with no refills sent to [facility].

## 2023-08-13 ENCOUNTER — Encounter: Payer: Self-pay | Admitting: Registered"

## 2023-08-13 ENCOUNTER — Encounter: Attending: Endocrinology | Admitting: Registered"

## 2023-08-13 DIAGNOSIS — F50019 Anorexia nervosa, restricting type, unspecified: Secondary | ICD-10-CM | POA: Diagnosis present

## 2023-08-13 DIAGNOSIS — E119 Type 2 diabetes mellitus without complications: Secondary | ICD-10-CM | POA: Diagnosis present

## 2023-08-13 NOTE — Patient Instructions (Addendum)
-   Continue to have 3 meals a day with at least 3 food groups: protein, vegetables, and grains.   - Add fruit to breakfast.   - Aim to have vegetables with lunch and dinner.   - Aim for at least 7 hours of sleep a night.

## 2023-08-13 NOTE — Progress Notes (Signed)
 Appointment start time: 2:02  Appointment end time: 2:29  Patient was seen on 08/13/2023 for nutrition counseling pertaining to disordered eating  Primary care provider: Garnette Ore, MD Therapist: Donny London (sees weekly, in-person)  ROI: will complete at next appt Any other medical team members: Dr. Slater Mam (psychiatrist)    Assessment  Pt states she has type 2 diabetes and worked with previous RD on this. Reports she was checking FBS (104-120 mg/dL) daily. States she was trying a new medication, cobenfy, but stopped taking after a week because she did not like the side effects.   States she went to local farmer's market to get fruits and vegetables with hopes it would help inspire her to eat them. States she bought squash but hasn't cooked it yet; may cook next week while home recovering from eye surgery. States she bought some strawberries that she is enjoying along with homemade parmesan. States she also likes watermelon and may try that soon.   Previous visit: Reports she worked with previous RD for 20+ years.  States she has anorexia. States anorexia made her lose all of her teeth which limits her eating options. States she is looking to have implants done to help make things better. States she lives alone except with dog.    Eating history: Length of time: 20+ years Previous treatments: outpatient care with previous RD until RD retired Goals for RD meetings: improve aid reflux, constipation, dizziness  Weight history:  Highest weight:    Lowest weight:  Most consistent weight:   What would you like to weigh: 104 lbs  How has weight changed in the past year:   Medical Information:  Changes in hair, skin, nails since ED started: no Chewing/swallowing difficulties: no Reflux or heartburn: yes, takes medication  Trouble with teeth: no Constipation, diarrhea: yes, constipation has BM 1-2x/week Dizziness/lightheadedness: every once in a while Headaches/body aches:  no Heart racing/chest pain: no Mood: depressed Sleep: sleeps 4-5 hrs/night Focus/concentration: no Cold intolerance: no Vision changes: yes  Mental health diagnosis: AN   Dietary assessment: A typical day consists of 2 meals and 1 snacks  Safe foods include: tuna, [place] yogurt, flour tortilla, peanut butter, bread, quiche, veggie burger crumbles, milk, cheese, squash, green beans, veggie casserole, bananas, strawberries, potatoes, oatmeal, soy protein, beans, tomatoes, cucumbers, mushrooms,   Avoided foods include: apples, meat other than tuna,   24 hour recall:  B: Greek yogurt + 1 slice of toast S L: PB wrap + 1/4 c cereal + strawberries S: tortilla + cup of strawberries D: PB sandwich  S  Beverages: water (3*16.9 oz; 51 oz)   What Methods Do You Use To Control Your Weight (Compensatory behaviors)?           Restricting (calories, fat, carbs)  Estimated energy intake: [phone] kcal  Estimated energy needs: 1600-1800 kcal 200-225 g CHO 120-135 g pro 36-40 g fat  Nutrition Diagnosis: NB-1.5 Disordered eating pattern As related to anorexia nervosa.  As evidenced by restriction of food.  Intervention/Goals: Pt was educated and counseled on eating to nourish the body, ways to improve nourishment, and meal/snack  planning. Pt agreed with goals listed. Goals: - Continue to have 3 meals a day with at least 3 food groups: protein, vegetables, and grains.  - Add fruit to breakfast.  - Aim to have vegetables with lunch and dinner.  - Aim for at least 7 hours of sleep a night.  Meal plan:    3 meals    1-3  snacks  Monitoring and Evaluation: Patient will follow up in 5 weeks.

## 2023-09-17 ENCOUNTER — Encounter: Attending: Endocrinology | Admitting: Registered"

## 2023-09-17 DIAGNOSIS — E119 Type 2 diabetes mellitus without complications: Secondary | ICD-10-CM | POA: Insufficient documentation

## 2023-09-17 DIAGNOSIS — F50019 Anorexia nervosa, restricting type, unspecified: Secondary | ICD-10-CM | POA: Insufficient documentation

## 2023-09-17 NOTE — Patient Instructions (Addendum)
-   Aim to have vegetables with lunch and dinner.   - Try having mushrooms + cherry tomatoes + cucumbers + tuna as lunch option.   - Keep up the great work with everything else!

## 2023-09-17 NOTE — Progress Notes (Signed)
 Appointment start time: 3:08  Appointment end time: 3:46  Patient was seen on 09/17/2023 for nutrition counseling pertaining to disordered eating  Primary care provider: Garnette Ore, MD Therapist: Donny Payor (sees weekly, in-person)  ROI: 09/17/2023 Any other medical team members: Dr. Slater Mam (psychiatrist)    Assessment  States she has been trying to eat more since our previous visit.  States she has been trying to get more calories in by buying frozen dinners to eat for dinner along with fruit and also increasing intake at breakfast time. States she has been thinking about having mushrooms, cherry tomatoes, and cucumbers as a salad.   Pt states she has type 2 diabetes and worked with previous RD on this. Reports she was checking FBS (105-125 mg/dL) daily.  Previous visit: States she also likes watermelon and may try that soon. Reports she worked with previous RD for 20+ years.  States she has anorexia. States anorexia made her lose all of her teeth which limits her eating options. States she is looking to have implants done to help make things better. States she lives alone except with dog.    Eating history: Length of time: 20+ years Previous treatments: outpatient care with previous RD until RD retired Goals for RD meetings: improve aid reflux, constipation, dizziness  Weight history:  Highest weight:    Lowest weight:  Most consistent weight:   What would you like to weigh: 104 lbs  How has weight changed in the past year:   Medical Information:  Changes in hair, skin, nails since ED started: no Chewing/swallowing difficulties: no Reflux or heartburn: yes, takes medication  Trouble with teeth: no Constipation, diarrhea: no, has improved, has BM every 1-2 days  Dizziness/lightheadedness: has improved Headaches/body aches: no Heart racing/chest pain: no Mood: depressed but better Sleep: sleeps 4-5 hrs/night Focus/concentration: no Cold intolerance: no Vision  changes: yes  Mental health diagnosis: AN   Dietary assessment: A typical day consists of 3 meals and 1 snacks  Safe foods include: tuna, [place] yogurt, flour tortilla, peanut butter, bread, quiche, veggie burger crumbles, milk, cheese, squash, green beans, veggie casserole, bananas, strawberries, potatoes, oatmeal, soy protein, beans, tomatoes, cucumbers, mushrooms,   Avoided foods include: apples, meat other than tuna,   24 hour recall:  B: [place] yogurt + 2 slices of toast S L: Greek yogurt + tortilla + strawberries (sometimes) S: D: frozen meal-bean and rice burrito + strawberries or spinach and cheese ravioli + fruit or PB sandwich  S  Beverages: water (3*16.9 oz; 51 oz)   What Methods Do You Use To Control Your Weight (Compensatory behaviors)?           Restricting (calories, fat, carbs)  Estimated energy intake: [phone] kcal  Estimated energy needs: 1600-1800 kcal 200-225 g CHO 120-135 g pro 36-40 g fat  Nutrition Diagnosis: NB-1.5 Disordered eating pattern As related to anorexia nervosa.  As evidenced by restriction of food.  Intervention/Goals: Pt was encouraged with changes made from previous visit. Discussed further meal planning ideas. Pt agreed with goals listed. Goals: - Aim to have vegetables with lunch and dinner.  - Try having mushrooms + cherry tomatoes + cucumbers + tuna as lunch option.  - Keep up the great work with everything else!  Meal plan:    3 meals    1-3 snacks  Monitoring and Evaluation: Patient will follow up in 5 weeks.

## 2023-09-19 ENCOUNTER — Ambulatory Visit: Payer: 59 | Admitting: Allergy

## 2023-09-24 ENCOUNTER — Ambulatory Visit (INDEPENDENT_AMBULATORY_CARE_PROVIDER_SITE_OTHER): Admitting: Gastroenterology

## 2023-09-24 ENCOUNTER — Other Ambulatory Visit (INDEPENDENT_AMBULATORY_CARE_PROVIDER_SITE_OTHER)

## 2023-09-24 ENCOUNTER — Encounter: Payer: Self-pay | Admitting: Gastroenterology

## 2023-09-24 VITALS — BP 94/70 | HR 64 | Ht 60.0 in | Wt 148.4 lb

## 2023-09-24 DIAGNOSIS — Z8619 Personal history of other infectious and parasitic diseases: Secondary | ICD-10-CM

## 2023-09-24 DIAGNOSIS — K219 Gastro-esophageal reflux disease without esophagitis: Secondary | ICD-10-CM

## 2023-09-24 DIAGNOSIS — D509 Iron deficiency anemia, unspecified: Secondary | ICD-10-CM | POA: Diagnosis not present

## 2023-09-24 LAB — COMPREHENSIVE METABOLIC PANEL WITH GFR
ALT: 10 U/L (ref 0–35)
AST: 14 U/L (ref 0–37)
Albumin: 4 g/dL (ref 3.5–5.2)
Alkaline Phosphatase: 55 U/L (ref 39–117)
BUN: 15 mg/dL (ref 6–23)
CO2: 31 meq/L (ref 19–32)
Calcium: 9.2 mg/dL (ref 8.4–10.5)
Chloride: 99 meq/L (ref 96–112)
Creatinine, Ser: 1.1 mg/dL (ref 0.40–1.20)
GFR: 52.39 mL/min — ABNORMAL LOW (ref >60.00)
Glucose, Bld: 134 mg/dL — ABNORMAL HIGH (ref 70–99)
Potassium: 3.7 meq/L (ref 3.5–5.1)
Sodium: 137 meq/L (ref 135–145)
Total Bilirubin: 0.3 mg/dL (ref 0.2–1.2)
Total Protein: 6.5 g/dL (ref 6.0–8.3)

## 2023-09-24 LAB — CBC WITH DIFFERENTIAL/PLATELET
Basophils Absolute: 0 K/uL (ref 0.0–0.1)
Basophils Relative: 1 % (ref 0.0–3.0)
Eosinophils Absolute: 0.1 K/uL (ref 0.0–0.7)
Eosinophils Relative: 3.2 % (ref 0.0–5.0)
HCT: 39.7 % (ref 36.0–46.0)
Hemoglobin: 13.1 g/dL (ref 12.0–15.0)
Lymphocytes Relative: 26.2 % (ref 12.0–46.0)
Lymphs Abs: 1 K/uL (ref 0.7–4.0)
MCHC: 33.1 g/dL (ref 30.0–36.0)
MCV: 89.5 fl (ref 78.0–100.0)
Monocytes Absolute: 0.5 K/uL (ref 0.1–1.0)
Monocytes Relative: 11.7 % (ref 3.0–12.0)
Neutro Abs: 2.3 K/uL (ref 1.4–7.7)
Neutrophils Relative %: 57.9 % (ref 43.0–77.0)
Platelets: 191 K/uL (ref 150.0–400.0)
RBC: 4.43 Mil/uL (ref 3.87–5.11)
RDW: 14.5 % (ref 11.5–15.5)
WBC: 3.9 K/uL — ABNORMAL LOW (ref 4.0–10.5)

## 2023-09-24 LAB — IBC + FERRITIN
Ferritin: 4.7 ng/mL — ABNORMAL LOW (ref 10.0–291.0)
Iron: 60 ug/dL (ref 42–145)
Saturation Ratios: 13.6 % — ABNORMAL LOW (ref 20.0–50.0)
TIBC: 439.6 ug/dL (ref 250.0–450.0)
Transferrin: 314 mg/dL (ref 212.0–360.0)

## 2023-09-24 MED ORDER — NA SULFATE-K SULFATE-MG SULF 17.5-3.13-1.6 GM/177ML PO SOLN: 1.0000 | ORAL | 0 refills | Status: DC
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
## 2023-09-24 NOTE — Patient Instructions (Signed)
 You have been scheduled for an endoscopy and colonoscopy. Please follow the written instructions given to you at your visit today.  If you use inhalers (even only as needed), please bring them with you on the day of your procedure.  DO NOT TAKE 7 DAYS PRIOR TO TEST- Trulicity (dulaglutide) Ozempic, Wegovy (semaglutide) Mounjaro (tirzepatide) Bydureon Bcise (exanatide extended release)  DO NOT TAKE 1 DAY PRIOR TO YOUR TEST Rybelsus (semaglutide) Adlyxin (lixisenatide) Victoza (liraglutide) Byetta (exanatide) ___________________________________________________________________________  We have sent the following medications to your pharmacy for you to pick up at your convenience: Suprep   Your provider has requested that you go to the basement level for lab work before leaving today. Press B on the elevator. The lab is located at the first door on the left as you exit the elevator.  Due to recent changes in healthcare laws, you may see the results of your imaging and laboratory studies on MyChart before your provider has had a chance to review them.  We understand that in some cases there may be results that are confusing or concerning to you. Not all laboratory results come back in the same time frame and the provider may be waiting for multiple results in order to interpret others.  Please give us  48 hours in order for your provider to thoroughly review all the results before contacting the office for clarification of your results.   _______________________________________________________  If your blood pressure at your visit was 140/90 or greater, please contact your primary care physician to follow up on this.  _______________________________________________________  If you are age 66 or older, your body mass index should be between 23-30. Your Body mass index is 28.98 kg/m. If this is out of the aforementioned range listed, please consider follow up with your Primary Care  Provider.  If you are age 66 or younger, your body mass index should be between 19-25. Your Body mass index is 28.98 kg/m. If this is out of the aformentioned range listed, please consider follow up with your Primary Care Provider.   ________________________________________________________  The [facility] providers would like to encourage you to use MYCHART to communicate with providers for non-urgent requests or questions.  Due to long hold times on the telephone, sending your provider a message by [facility] may be a faster and more efficient way to get a response.  Please allow 48 business hours for a response.  Please remember that this is for non-urgent requests.  _______________________________________________________  [facility] is using a team-based approach to care.  Your team is made up of your doctor and two to three APPS. Our APPS (Nurse Practitioners and Physician Assistants) work with your physician to ensure care continuity for you. They are fully qualified to address your health concerns and develop a treatment plan. They communicate directly with your gastroenterologist to care for you. Seeing the Advanced Practice Practitioners on your physician's team can help you by facilitating care more promptly, often allowing for earlier appointments, access to diagnostic testing, procedures, and other specialty referrals.   Thank you for choosing me and [facility].  Camie Furbish, PA-C

## 2023-09-25 ENCOUNTER — Encounter: Payer: Self-pay | Admitting: Gastroenterology

## 2023-09-26 ENCOUNTER — Ambulatory Visit: Payer: Self-pay | Admitting: Gastroenterology

## 2023-09-30 HISTORY — PX: PARS PLANA VITRECTOMY W/ REPAIR OF MACULAR HOLE: SHX2170

## 2023-10-15 ENCOUNTER — Ambulatory Visit: Admitting: Internal Medicine

## 2023-10-15 ENCOUNTER — Encounter: Payer: Self-pay | Admitting: Internal Medicine

## 2023-10-15 MED ORDER — SODIUM CHLORIDE 0.9 % IV SOLN: 500.0000 mL | Freq: Once | INTRAVENOUS | Status: DC

## 2023-10-15 NOTE — Progress Notes (Unsigned)
   Patient presented for EGD and colonoscopy today, to evaluate iron deficiency anemia.  She was wearing a wristband indicating she had a gas bubble placed for macular hole on August 19.  The patient still sees some of the gas bubble.  I contacted the ophthalmology and spoke to Dr. Eben staff who communicated with the physician.  Recommended holding off on procedures due to risk of blindness, until gas bubble completely gone, typically safe 6 weeks after the procedure.  Patient expressed understanding and will reschedule.  She has not been taking iron supplement I recommended she get restarted on that it has been about a month since she has taken it.  She has refills.

## 2023-10-21 ENCOUNTER — Ambulatory Visit: Admitting: Registered"

## 2023-11-24 ENCOUNTER — Encounter

## 2023-11-24 ENCOUNTER — Telehealth: Payer: Self-pay

## 2023-11-24 NOTE — Telephone Encounter (Signed)
 RN attempted to call patient again at number on Mychart for pre-visit appt, but went straight to voicemail. Informed patient to call back before 4:30PM to reschedule her pre-visit. Informed patient if she did not reschedule her previsit we would have to cancel her procedure on 12/10/2023.

## 2023-11-24 NOTE — Telephone Encounter (Signed)
 Patient has been rescheduled for 10/15 virtual visit. Please advise, thank you

## 2023-11-24 NOTE — Telephone Encounter (Signed)
 Called patient on number in Mychart. No answer. Informed RN would call back in 10 minutes.  Informed patient she had to have pre-visit for upcoming procedure. If she did not have a pre-visit then we would have to cancel her procedure.  iAdvised to call back by 4:30pm if she needed to reschedule her pre-visit.

## 2023-11-26 ENCOUNTER — Ambulatory Visit (AMBULATORY_SURGERY_CENTER)

## 2023-11-26 ENCOUNTER — Encounter

## 2023-11-26 VITALS — Ht 60.0 in | Wt 149.8 lb

## 2023-11-26 DIAGNOSIS — K219 Gastro-esophageal reflux disease without esophagitis: Secondary | ICD-10-CM

## 2023-11-26 DIAGNOSIS — Z8619 Personal history of other infectious and parasitic diseases: Secondary | ICD-10-CM

## 2023-11-26 DIAGNOSIS — D509 Iron deficiency anemia, unspecified: Secondary | ICD-10-CM

## 2023-11-26 DIAGNOSIS — Z860100 Personal history of colon polyps, unspecified: Secondary | ICD-10-CM

## 2023-11-26 MED ORDER — NA SULFATE-K SULFATE-MG SULF 17.5-3.13-1.6 GM/177ML PO SOLN: 1.0000 | Freq: Once | ORAL | 0 refills | Status: AC

## 2023-11-26 NOTE — Progress Notes (Signed)
 No egg or soy allergy  known to patient  No issues known to pt with past sedation with any surgeries or procedures Patient denies ever being told they had issues or difficulty with intubation  No FH of Malignant Hyperthermia Pt is not on diet pills Pt is not on  home 02  Pt is not on blood thinners  Pt has intermittent issues with constipation and uses colace, was instructed to continue colace  No A fib or A flutter Have any cardiac testing pending--No Pt can ambulate  Pt denies use of chewing tobacco Discussed diabetic I weight loss medication holds Discussed NSAID holds Checked BMI Pt instructed to use Single[employer] or GoodRx for a price reduction on prep  Patient's chart reviewed by Norleen Schillings CNRA prior to previsit and patient appropriate for the [facility].  Pre visit completed and red dot placed by patient's name on their procedure day (on provider's schedule).
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
## 2023-12-10 ENCOUNTER — Encounter: Payer: Self-pay | Admitting: Internal Medicine

## 2023-12-10 ENCOUNTER — Ambulatory Visit (AMBULATORY_SURGERY_CENTER): Admitting: Internal Medicine

## 2023-12-10 VITALS — BP 138/72 | HR 59 | Temp 97.9°F | Resp 13 | Ht 60.0 in | Wt 149.0 lb

## 2023-12-10 DIAGNOSIS — D509 Iron deficiency anemia, unspecified: Secondary | ICD-10-CM | POA: Diagnosis not present

## 2023-12-10 DIAGNOSIS — Z9889 Other specified postprocedural states: Secondary | ICD-10-CM

## 2023-12-10 DIAGNOSIS — D120 Benign neoplasm of cecum: Secondary | ICD-10-CM | POA: Diagnosis not present

## 2023-12-10 DIAGNOSIS — K317 Polyp of stomach and duodenum: Secondary | ICD-10-CM | POA: Diagnosis not present

## 2023-12-10 DIAGNOSIS — Z860100 Personal history of colon polyps, unspecified: Secondary | ICD-10-CM

## 2023-12-10 DIAGNOSIS — Z8619 Personal history of other infectious and parasitic diseases: Secondary | ICD-10-CM

## 2023-12-10 DIAGNOSIS — D122 Benign neoplasm of ascending colon: Secondary | ICD-10-CM

## 2023-12-10 MED ORDER — SODIUM CHLORIDE 0.9 % IV SOLN: 500.0000 mL | Freq: Once | INTRAVENOUS | Status: AC

## 2023-12-10 NOTE — Progress Notes (Signed)
 Pt's states no medical or surgical changes since previsit or office visit.

## 2023-12-10 NOTE — Patient Instructions (Addendum)
 Found and removed a small stomach polyp that looks benign.  I took some biopsies of the upper intestine to see if you are not absorbing iron properly.  Colonoscopy revealed 3 polyps that I removed they all look benign but precancerous.  It was otherwise normal.  I will let you know what the biopsy results show and further recommendations.  I suspect I will recommend a repeat colonoscopy in 3 years based upon the polyps I found and removed.  Please continue to take iron tablets to build up your iron stores and you can follow-up with Dr. Nichole on that.  I appreciate the opportunity to care for you. Lupita CHARLENA Commander, MD, [facility]  Resume previous diet Continue present medications Await pathology results Handout on polyps given  YOU HAD AN ENDOSCOPIC PROCEDURE TODAY AT THE [facility]:   Refer to the procedure report that was given to you for any specific questions about what was found during the examination.  If the procedure report does not answer your questions, please call your gastroenterologist to clarify.  If you requested that your care partner not be given the details of your procedure findings, then the procedure report has been included in a sealed envelope for you to review at your convenience later.  YOU SHOULD EXPECT: Some feelings of bloating in the abdomen. Passage of more gas than usual.  Walking can help get rid of the air that was put into your GI tract during the procedure and reduce the bloating. If you had a lower endoscopy (such as a colonoscopy or flexible sigmoidoscopy) you may notice spotting of blood in your stool or on the toilet paper. If you underwent a bowel prep for your procedure, you may not have a normal bowel movement for a few days.  Please Note:  You might notice some irritation and congestion in your nose or some drainage.  This is from the oxygen used during your procedure.  There is no need for concern and it should clear up in a day or so.  SYMPTOMS  TO REPORT IMMEDIATELY:  Following lower endoscopy (colonoscopy or flexible sigmoidoscopy):  Excessive amounts of blood in the stool  Significant tenderness or worsening of abdominal pains  Swelling of the abdomen that is new, acute  Fever of 100F or higher  Following upper endoscopy (EGD)  Vomiting of blood or coffee ground material  New chest pain or pain under the shoulder blades  Painful or persistently difficult swallowing  New shortness of breath  Fever of 100F or higher  Black, tarry-looking stools  Resume previous diet Continue present medications Await pathology results Handout on polyps given   For urgent or emergent issues, a gastroenterologist can be reached at any hour by calling [phone]. Do not use MyChart messaging for urgent concerns.    DIET:  We do recommend a small meal at first, but then you may proceed to your regular diet.  Drink plenty of fluids but you should avoid alcoholic beverages for 24 hours.  ACTIVITY:  You should plan to take it easy for the rest of today and you should NOT DRIVE or use heavy machinery until tomorrow (because of the sedation medicines used during the test).    FOLLOW UP: Our staff will call the number listed on your records the next business day following your procedure.  We will call around 7:15- 8:00 am to check on you and address any questions or concerns that you may have regarding the information given to  you following your procedure. If we do not reach you, we will leave a message.     If any biopsies were taken you will be contacted by phone or by letter within the next 1-3 weeks.  Please call us  at [phone] if you have not heard about the biopsies in 3 weeks.    SIGNATURES/CONFIDENTIALITY: You and/or your care partner have signed paperwork which will be entered into your electronic medical record.  These signatures attest to the fact that that the information above on your After Visit Summary has been reviewed  and is understood.  Full responsibility of the confidentiality of this discharge information lies with you and/or your care-partner.

## 2023-12-10 NOTE — Progress Notes (Signed)
 Called to room to assist during endoscopic procedure.  Patient ID and intended procedure confirmed with present staff. Received instructions for my participation in the procedure from the performing physician.

## 2023-12-10 NOTE — Progress Notes (Signed)
 Sedate, gd SR, tolerated procedure well, VSS, report to RN

## 2023-12-10 NOTE — Op Note (Addendum)
 [facility] Patient Name: Becky [name] Procedure Date: 12/10/2023 1:30 PM MRN: [id number] Endoscopist: Lupita FORBES Commander , MD, [id number] Age: 67 Referring MD:  Date of Birth: 1955-11-24 Gender: Female Account #: [id number] Procedure:                Colonoscopy Indications:              Iron deficiency anemia Medicines:                Monitored Anesthesia Care Procedure:                Pre-Anesthesia Assessment:                           - Prior to the procedure, a History and Physical                            was performed, and patient medications and                            allergies were reviewed. The patient's tolerance of                            previous anesthesia was also reviewed. The risks                            and benefits of the procedure and the sedation                            options and risks were discussed with the patient.                            All questions were answered, and informed consent                            was obtained. Prior Anticoagulants: The patient has                            taken no anticoagulant or antiplatelet agents. ASA                            Grade Assessment: II - A patient with mild systemic                            disease. After reviewing the risks and benefits,                            the patient was deemed in satisfactory condition to                            undergo the procedure.                           After obtaining informed consent, the colonoscope  was passed under direct vision. Throughout the                            procedure, the patient's blood pressure, pulse, and                            oxygen saturations were monitored continuously. The                            [id number]-HQ190L Colonoscope [id number] was introduced                            through the anus and advanced to the the terminal                            ileum, with identification of  the appendiceal                            orifice and IC valve. The colonoscopy was performed                            without difficulty. The patient tolerated the                            procedure well. The quality of the bowel                            preparation was adequate. The ileocecal valve,                            appendiceal orifice, and rectum were photographed.                            The bowel preparation used was SUPREP via split                            dose instruction. Scope In: 1:48:11 PM Scope Out: 2:14:12 PM Scope Withdrawal Time: 0 hours 19 minutes 28 seconds  Total Procedure Duration: 0 hours 26 minutes 1 second  Findings:                 The perianal and digital rectal examinations were                            normal.                           A 20 mm polyp was found in the cecum. The polyp was                            sessile. The polyp was removed with a piecemeal                            technique using a cold snare. Resection and  retrieval were complete. Verification of patient                            identification for the specimen was done. Estimated                            blood loss was minimal.                           An 8 mm polyp was found in the ascending colon. The                            polyp was sessile. The polyp was removed with a                            cold snare. Resection and retrieval were complete.                            Verification of patient identification for the                            specimen was done. Estimated blood loss was minimal.                           A 12 mm polyp was found in the ascending colon. The                            polyp was sessile. The polyp was removed with a                            cold snare. Resection and retrieval were complete.                            Verification of patient identification for the                             specimen was done. Estimated blood loss was minimal.                           The exam was otherwise without abnormality on                            direct and retroflexion views. Complications:            No immediate complications. Estimated Blood Loss:     Estimated blood loss was minimal. Impression:               - One 20 mm polyp in the cecum, removed piecemeal                            using a cold snare. Resected and retrieved.                           - One  8 mm polyp in the ascending colon, removed                            with a cold snare. Resected and retrieved.                           - One 12 mm polyp in the ascending colon, removed                            with a cold snare. Resected and retrieved.                           - The examination was otherwise normal on direct                            and retroflexion views. Recommendation:           - Patient has a contact number available for                            emergencies. The signs and symptoms of potential                            delayed complications were discussed with the                            patient. Return to normal activities tomorrow.                            Written discharge instructions were provided to the                            patient.                           - Resume previous diet.                           - Continue present medications. Including iron                            supplementation.                           - Await pathology results.                           - Repeat colonoscopy is recommended for                            surveillance. The colonoscopy date will be                            determined after pathology results from today's  exam become available for review. Lupita FORBES Commander, MD 12/10/2023 2:26:57 PM This report has been signed electronically.

## 2023-12-10 NOTE — Op Note (Addendum)
 [facility] Patient Name: Becky [name] Procedure Date: 12/10/2023 1:31 PM MRN: [id number] Endoscopist: Lupita FORBES Commander , MD, [id number] Age: 68 Referring MD:  Date of Birth: 27-Sep-1955 Gender: Female Account #: [id number] Procedure:                Upper GI endoscopy Indications:              Iron deficiency anemia Medicines:                Monitored Anesthesia Care Procedure:                Pre-Anesthesia Assessment:                           - Prior to the procedure, a History and Physical                            was performed, and patient medications and                            allergies were reviewed. The patient's tolerance of                            previous anesthesia was also reviewed. The risks                            and benefits of the procedure and the sedation                            options and risks were discussed with the patient.                            All questions were answered, and informed consent                            was obtained. Prior Anticoagulants: The patient has                            taken no anticoagulant or antiplatelet agents. ASA                            Grade Assessment: II - A patient with mild systemic                            disease. After reviewing the risks and benefits,                            the patient was deemed in satisfactory condition to                            undergo the procedure.                           After obtaining informed consent, the endoscope was  passed under direct vision. Throughout the                            procedure, the patient's blood pressure, pulse, and                            oxygen saturations were monitored continuously. The                            GIF [id number] #[id number] was introduced through the                            mouth, and advanced to the second part of duodenum.                            The upper GI endoscopy  was accomplished without                            difficulty. The patient tolerated the procedure                            well. Scope In: Scope Out: Findings:                 A single 6 mm sessile polyp with no bleeding and no                            stigmata of recent bleeding was found in the                            prepyloric region of the stomach. Biopsies were                            taken with a cold forceps for histology.                            Verification of patient identification for the                            specimen was done. Estimated blood loss was minimal.                           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 The wrap appeared intact.                           The exam was otherwise without abnormality.                           The cardia and gastric fundus were otherwise normal  on retroflexion.                           Biopsies for histology were taken with a cold                            forceps in the second portion of the duodenum for                            evaluation of celiac disease. Verification of                            patient identification for the specimen was done. Complications:            No immediate complications. Estimated Blood Loss:     Estimated blood loss was minimal. Impression:               - A single gastric polyp. Biopsied. 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 The wrap                            appears intact.                           - The examination was otherwise normal.                           - Biopsies were taken with a cold forceps for                            evaluation of celiac disease. Recommendation:           - Patient has a contact number available for                            emergencies. The signs and symptoms of potential                             delayed complications were discussed with the                            patient. Return to normal activities tomorrow.                            Written discharge instructions were provided to the                            patient.                           - Resume previous diet.                           -  Continue present medications.                           - Await pathology results.                           - See the other procedure note for documentation of                            additional recommendations. Lupita FORBES Commander, MD 12/10/2023 2:21:50 PM This report has been signed electronically.

## 2023-12-11 ENCOUNTER — Telehealth: Payer: Self-pay

## 2023-12-11 NOTE — Telephone Encounter (Signed)
  Follow up Call-     12/10/2023    1:11 PM  Call back number  Post procedure Call Back phone  # [phone]  Permission to leave phone message Yes     Patient questions:  Do you have a fever, pain , or abdominal swelling? No. Pain Score  0 *  Have you tolerated food without any problems? Yes.    Have you been able to return to your normal activities? Yes.    Do you have any questions about your discharge instructions: Diet   No. Medications  No. Follow up visit  No.  Do you have questions or concerns about your Care? No.  Actions: * If pain score is 4 or above: No action needed, pain <4.

## 2023-12-15 LAB — SURGICAL PATHOLOGY

## 2023-12-19 ENCOUNTER — Encounter: Payer: Self-pay | Admitting: Internal Medicine

## 2023-12-19 ENCOUNTER — Ambulatory Visit: Payer: Self-pay | Admitting: Internal Medicine

## 2023-12-19 DIAGNOSIS — Z860100 Personal history of colon polyps, unspecified: Secondary | ICD-10-CM | POA: Insufficient documentation

## 2024-02-22 ENCOUNTER — Emergency Department (HOSPITAL_COMMUNITY)
Admission: EM | Admit: 2024-02-22 | Discharge: 2024-02-22 | Disposition: A | Discharge status: Home / Self Care | Attending: Emergency Medicine | Admitting: Emergency Medicine

## 2024-02-22 ENCOUNTER — Other Ambulatory Visit: Payer: Self-pay

## 2024-02-22 ENCOUNTER — Emergency Department (HOSPITAL_COMMUNITY)

## 2024-02-22 DIAGNOSIS — R0789 Other chest pain: Secondary | ICD-10-CM | POA: Diagnosis not present

## 2024-02-22 DIAGNOSIS — F410 Panic disorder [episodic paroxysmal anxiety] without agoraphobia: Secondary | ICD-10-CM | POA: Diagnosis not present

## 2024-02-22 DIAGNOSIS — Z9181 History of falling: Secondary | ICD-10-CM | POA: Insufficient documentation

## 2024-02-22 DIAGNOSIS — S299XXA Unspecified injury of thorax, initial encounter: Secondary | ICD-10-CM | POA: Diagnosis present

## 2024-02-22 DIAGNOSIS — D3502 Benign neoplasm of left adrenal gland: Secondary | ICD-10-CM | POA: Insufficient documentation

## 2024-02-22 DIAGNOSIS — F419 Anxiety disorder, unspecified: Secondary | ICD-10-CM

## 2024-02-22 DIAGNOSIS — R45851 Suicidal ideations: Secondary | ICD-10-CM | POA: Diagnosis present

## 2024-02-22 DIAGNOSIS — Z79899 Other long term (current) drug therapy: Secondary | ICD-10-CM | POA: Insufficient documentation

## 2024-02-22 LAB — COMPREHENSIVE METABOLIC PANEL WITH GFR
ALT: 9 U/L (ref 0–44)
AST: 25 U/L (ref 15–41)
Albumin: 4.2 g/dL (ref 3.5–5.0)
Alkaline Phosphatase: 72 U/L (ref 38–126)
Anion gap: 11 (ref 5–15)
BUN: 19 mg/dL (ref 8–23)
CO2: 26 mmol/L (ref 22–32)
Calcium: 9.7 mg/dL (ref 8.9–10.3)
Chloride: 98 mmol/L (ref 98–111)
Creatinine, Ser: 0.88 mg/dL (ref 0.44–1.00)
GFR, Estimated: >60 mL/min
Glucose, Bld: 122 mg/dL — ABNORMAL HIGH (ref 70–99)
Potassium: 3.8 mmol/L (ref 3.5–5.1)
Sodium: 135 mmol/L (ref 135–145)
Total Bilirubin: 0.2 mg/dL (ref 0.0–1.2)
Total Protein: 6.9 g/dL (ref 6.5–8.1)

## 2024-02-22 LAB — CBC WITH DIFFERENTIAL/PLATELET
Abs Immature Granulocytes: 0.02 K/uL (ref 0.00–0.07)
Basophils Absolute: 0 K/uL (ref 0.0–0.1)
Basophils Relative: 0 %
Eosinophils Absolute: 0.1 K/uL (ref 0.0–0.5)
Eosinophils Relative: 2 %
HCT: 39.3 % (ref 36.0–46.0)
Hemoglobin: 12.6 g/dL (ref 12.0–15.0)
Immature Granulocytes: 0 %
Lymphocytes Relative: 21 %
Lymphs Abs: 1.1 K/uL (ref 0.7–4.0)
MCH: 30.5 pg (ref 26.0–34.0)
MCHC: 32.1 g/dL (ref 30.0–36.0)
MCV: 95.2 fL (ref 80.0–100.0)
Monocytes Absolute: 0.7 K/uL (ref 0.1–1.0)
Monocytes Relative: 13 %
Neutro Abs: 3.4 K/uL (ref 1.7–7.7)
Neutrophils Relative %: 64 %
Platelets: 227 K/uL (ref 150–400)
RBC: 4.13 MIL/uL (ref 3.87–5.11)
RDW: 13.2 % (ref 11.5–15.5)
WBC: 5.4 K/uL (ref 4.0–10.5)
nRBC: 0 % (ref 0.0–0.2)

## 2024-02-22 LAB — TROPONIN T, HIGH SENSITIVITY: Troponin T High Sensitivity: <15 ng/L (ref 0–19)

## 2024-02-22 MED ORDER — LORAZEPAM 1 MG PO TABS
2.0000 mg | ORAL_TABLET | Freq: Once | ORAL | Status: AC
  Administered 2024-02-22: 2 mg via ORAL
  Filled 2024-02-22: qty 2

## 2024-02-22 MED ORDER — IOHEXOL 300 MG/ML  SOLN
75.0000 mL | Freq: Once | INTRAMUSCULAR | Status: AC | PRN
  Administered 2024-02-22: 75 mL via INTRAVENOUS

## 2024-02-22 NOTE — ED Provider Notes (Signed)
 " [place] EMERGENCY DEPARTMENT AT [facility] Provider Note   CSN: [id number] Arrival date & time: 02/22/24  1459     Patient presents with: Anxiety and Panic Attack   Becky [name] is a 69 y.o. female.    Anxiety     69 year old female presenting to the emergency department with concern for severe anxiety.  Patient states that she has been prescribed Klonopin  3 times daily and has been having persistent symptoms of severe anxiety.  In the setting of this, the patient endorses passive suicidal ideation with no plan.  She denies any symptoms of significant withdrawal other than mild tremor.  No history of seizure.  She endorses shakiness and anxiousness.  She denies any plan of suicide.  She denies any hallucinations.  Additionally, the patient states that a few days ago she fell striking the middle of her chest on a corner table and she has been having sternal chest pain.  She denies any other injuries or complaints, denies any head trauma.  She is not on anticoagulation.  She arrives GCS 15, ABC intact.  Prior to Admission medications  Medication Sig Start Date End Date Taking? Authorizing Provider  Accu-Chek Softclix Lancets lancets 3 (three) times daily. for testing 12/21/21   [provider]  albuterol  (VENTOLIN  HFA) 108 (90 Base) MCG/ACT inhaler Inhale 2 puffs into the lungs every 4 (four) hours as needed for wheezing or shortness of breath. 03/21/22   Jeneal Danita Macintosh, MD  ATIVAN  0.5 MG tablet Take 0.5 mg by mouth 2 (two) times daily. 09/23/23   [provider]  Azelastine-Fluticasone  (DYMISTA) 137-50 MCG/ACT SUSP PLACE 1 SPRAY INTO THE NOSE 2 (TWO) TIMES DAILY. 08/11/23   Jeneal Danita Macintosh, MD  Blood Glucose Monitoring Suppl (ACCU-CHEK GUIDE) w/Device KIT  12/19/21   [provider]  Blood Glucose Monitoring Suppl (GLUCOCOM BLOOD GLUCOSE MONITOR) DEVI Check blood sugars three times a day 12/19/21   [provider]   buPROPion  (WELLBUTRIN  XL) 150 MG 24 hr tablet Take 450 mg by mouth every morning. 05/08/22   [provider]  cholecalciferol  (VITAMIN D3) 25 MCG (1000 UNIT) tablet Take 1,000 Units by mouth daily.    [provider]  docusate sodium (COLACE) 100 MG capsule Take 200 mg by mouth daily.    [provider]  DULoxetine  (CYMBALTA ) 60 MG capsule Take 120 mg by mouth daily.    [provider]  EPIPEN  2-PAK 0.3 MG/0.3ML SOAJ injection Inject 0.3 mg into the muscle as needed for anaphylaxis. 04/05/14   [provider]  gabapentin  (NEURONTIN ) 300 MG capsule Take 300 mg by mouth 2 (two) times daily.    [provider]  hydrochlorothiazide  (MICROZIDE ) 12.5 MG capsule Take 12.5 mg by mouth daily.    [provider]  ibuprofen (ADVIL) 200 MG tablet Take 800 mg by mouth every 6 (six) hours as needed.    [provider]  INGREZZA 60 MG capsule Take 60 mg by mouth at bedtime. 09/12/23   [provider]  iron polysaccharides (NIFEREX) 150 MG capsule Take 150 mg by mouth daily. 03/05/23   [provider]  levocetirizine (XYZAL ) 5 MG tablet Take 1 tablet (5 mg total) by mouth every evening. 03/21/23   Jeneal Danita Macintosh, MD  levothyroxine (SYNTHROID) 25 MCG tablet Take 25 mcg by mouth daily.    [provider]  Multiple Vitamin (MULTI VITAMIN PO) Take 1 tablet by mouth daily. Patient not taking: No sig reported  [provider]  nebivolol  (BYSTOLIC ) 5 MG tablet Take 5 mg by mouth daily. 09/08/23   [provider]  Olopatadine  HCl (PATADAY ) 0.2 % SOLN Place 1 drop into both eyes daily as needed. 03/21/23   Jeneal Danita Macintosh, MD  Olopatadine -Mometasone  (RYALTRIS ) [phone] MCG/ACT SUSP Place 2 sprays into the nose 2 (two) times daily. 03/21/23   Jeneal Danita Macintosh, MD  omeprazole  (PRILOSEC) 40 MG capsule Take 40 mg by mouth daily. 04/19/19   [provider]  prednisoLONE acetate (PRED  FORTE) 1 % ophthalmic suspension PLEASE SEE ATTACHED FOR DETAILED DIRECTIONS Patient not taking: No sig reported 07/23/23   [provider]  risperiDONE (RISPERDAL) 1 MG tablet Take 1 mg by mouth 3 (three) times daily. 03/17/23   [provider]  SYMBICORT  160-4.5 MCG/ACT inhaler Inhale 2 puffs into the lungs 2 (two) times daily. 03/21/23   Jeneal Danita Macintosh, MD  traZODone  (DESYREL ) 100 MG tablet Take 200 mg by mouth at bedtime.    [provider]  zolpidem  (AMBIEN ) 10 MG tablet Take 10 mg by mouth at bedtime as needed. 03/18/23   [provider]    Allergies: Methadone, Nitrofurantoin, Oxycodone-acetaminophen , Percocet [oxycodone-acetaminophen ], Penicillins, Amoxicillin, Aspirin, Clarithromycin, Codeine, Hydrocodone -acetaminophen , Morphine, Ms contin [morphine sulfate], and Propoxyphene    Review of Systems  All other systems reviewed and are negative.   Updated Vital Signs BP 128/76 (BP Location: Left Arm)   Pulse 65   Temp 97.8 F (36.6 C) (Oral)   Resp 15   SpO2 98%   Physical Exam Vitals and nursing note reviewed.  Constitutional:      General: She is not in acute distress.    Appearance: She is well-developed.     Comments: GCS 15, ABC intact  HENT:     Head: Normocephalic and atraumatic.  Eyes:     Extraocular Movements: Extraocular movements intact.     Conjunctiva/sclera: Conjunctivae normal.     Pupils: Pupils are equal, round, and reactive to light.  Neck:     Comments: No midline tenderness to palpation of the cervical spine.  Range of motion intact Cardiovascular:     Rate and Rhythm: Normal rate and regular rhythm.  Pulmonary:     Effort: Pulmonary effort is normal. No respiratory distress.     Breath sounds: Normal breath sounds.  Chest:     Comments: Clavicles stable nontender to AP compression.  Chest wall stable with TTP of the midsternum Abdominal:     Palpations: Abdomen is soft.     Tenderness: There is no  abdominal tenderness.     Comments: Pelvis stable to lateral compression  Musculoskeletal:        General: No swelling.     Cervical back: Neck supple.     Comments: No midline tenderness to palpation of the thoracic or lumbar spine.  Extremities atraumatic with intact range of motion  Skin:    General: Skin is warm and dry.     Capillary Refill: Capillary refill takes less than 2 seconds.  Neurological:     Mental Status: She is alert.     Comments: Cranial nerves II through XII grossly intact.  Moving all 4 extremities spontaneously.  Sensation grossly intact all 4 extremities  Psychiatric:        Attention and Perception: Attention and perception normal.        Mood and Affect: Mood is anxious.        Speech: Speech normal.  Behavior: Behavior normal. Behavior is cooperative.        Thought Content: Thought content includes suicidal ideation. Thought content does not include suicidal plan.     (all labs ordered are listed, but only abnormal results are displayed) Labs Reviewed  COMPREHENSIVE METABOLIC PANEL WITH GFR - Abnormal; Notable for the following components:      Result Value   Glucose, Bld 122 (*)    All other components within normal limits  CBC WITH DIFFERENTIAL/PLATELET  TROPONIN T, HIGH SENSITIVITY    EKG: EKG Interpretation Date/Time:  Sunday February 22 2024 18:19:17 EST Ventricular Rate:  66 PR Interval:  122 QRS Duration:  84 QT Interval:  428 QTC Calculation: 449 R Axis:   55  Text Interpretation: Sinus rhythm Confirmed by Jerrol Agent ([id number]) on 02/22/2024 6:20:31 PM  Radiology: CT Chest W Contrast Result Date: 02/22/2024 EXAM: CT CHEST WITH CONTRAST 02/22/2024 07:34:49 PM TECHNIQUE: CT of the chest was performed with the administration of 75 mL of iohexol  (OMNIPAQUE ) 300 MG/ML solution. Multiplanar reformatted images are provided for review. Automated exposure control, iterative reconstruction, and/or weight based adjustment of the mA/kV was  utilized to reduce the radiation dose to as low as reasonably achievable. COMPARISON: Partial comparison to CT abdomen dated 03/27/2021. CLINICAL HISTORY: Chest trauma, blunt. FINDINGS: MEDIASTINUM: Trace pericardial effusion. The central airways are clear. LYMPH NODES: No mediastinal, hilar or axillary lymphadenopathy. LUNGS AND PLEURA: No focal consolidation or pulmonary edema. No pleural effusion or pneumothorax. SOFT TISSUES/BONES: No acute abnormality of the bones or soft tissues. Sternum is intact. UPPER ABDOMEN: Small hiatal hernia with postsurgical changes at the GE junction. 2.5 cm left adrenal nodule (image 130), previously characterized as a benign adrenal adenoma, although mildly progressive. IMPRESSION: 1. No traumatic injury to the chest. 2. Benign left adrenal adenoma. Electronically signed by: Pinkie Pebbles MD MD 02/22/2024 07:47 PM EST RP Workstation: [id number]     Procedures   Medications Ordered in the ED  LORazepam  (ATIVAN ) tablet 2 mg (2 mg Oral Given 02/22/24 1810)  iohexol  (OMNIPAQUE ) 300 MG/ML solution 75 mL (75 mLs Intravenous Contrast Given 02/22/24 1930)                                    Medical Decision Making Amount and/or Complexity of Data Reviewed Labs: ordered. Radiology: ordered.  Risk Prescription drug management.    69 year old female presenting to the emergency department with concern for severe anxiety.  Patient states that she has been prescribed Klonopin  3 times daily and has been having persistent symptoms of severe anxiety.  In the setting of this, the patient endorses passive suicidal ideation with no plan.  She denies any symptoms of significant withdrawal other than mild tremor.  No history of seizure.  She endorses shakiness and anxiousness.  She denies any plan of suicide.  She denies any hallucinations.  Additionally, the patient states that a few days ago she fell striking the middle of her chest on a corner table and she has been having  sternal chest pain.  She denies any other injuries or complaints, denies any head trauma.  She is not on anticoagulation.  She arrives GCS 15, ABC intact.  On arrival, the patient was vitally stable.  On exam the patient had mild sternal tenderness to palpation in the setting of a recent fall.  No head trauma or loss of consciousness.  Will obtain further evaluation via CT imaging  and screening laboratory evaluation.  Patient also endorsing significant anxiety, passive SI.  Administered Ativan  in the emergency department for anxiousness.  EKG: Sinus rhythm, ventricular rate 6 6, no acute ischemic changes or abnormal intervals  Labs: CBC without a leukocytosis or anemia, CMP unremarkable, cardiac troponin normal.  CT Chest W: IMPRESSION:  1. No traumatic injury to the chest.  2. Benign left adrenal adenoma.   TTS: TTS consult placed, evaluated by psychiatry bedside.  Pt cleared by psychiatry for discharge and outpatient management.  Explained that I cannot treat anxiety chronically from the emergency department setting that will be important for her to follow-up with her primary care provider this week for continued management of her anxiety.     Final diagnoses:  Anxiety    ED Discharge Orders     None          Jerrol Agent, MD 02/22/24 2043  "

## 2024-02-22 NOTE — Discharge Instructions (Addendum)
 Discharge recommendations:   Medications: Patient is to take medications as prescribed. No medication changes were made during your visit. The patient is to contact a medical professional and/or outpatient provider to address any new side effects that develop. The patient should update outpatient providers of any new medications and/or medication changes.   Outpatient Follow up: Please follow up with your outpatient psychiatrist at [facility] and [facility].  Therapy: We recommend that patient continue participating in individual therapy to address mental health concerns.  Atypical antipsychotics: If you are prescribed an atypical antipsychotic, it is recommended that your height, weight, BMI, blood pressure, fasting lipid panel, and fasting blood sugar be monitored by your outpatient providers.  Safety:   The following safety precautions should be taken:   No sharp objects. This includes scissors, razors, scrapers, and putty knives.   Chemicals should be removed and locked up.   Medications should be removed and locked up.   Weapons should be removed and locked up. This includes firearms, knives and instruments that can be used to cause injury.   The patient should abstain from use of illicit substances/drugs and abuse of any medications.  If symptoms worsen or do not continue to improve or if the patient becomes actively suicidal or homicidal then it is recommended that the patient return to the closest hospital emergency department, the [facility], or call 911 for further evaluation and treatment. National Suicide Prevention Lifeline 1-800-SUICIDE or 509-662-9716.  About 988 988 offers 24/7 access to trained crisis counselors who can help people experiencing mental health-related distress. People can call or text 988 or chat 988lifeline.org for themselves or if they are worried about a loved one who may need crisis support.

## 2024-02-22 NOTE — ED Triage Notes (Addendum)
 Patient BIBA coming from home witrh increased anxiety/panic attacks since Thursday no relief woth klonopin . 110/70 BP. Patient psychiatrist Dr. Donny Newer phone # [phone]

## 2024-02-22 NOTE — ED Notes (Signed)
 Patient provided with taxi voucher for assistance home

## 2024-02-22 NOTE — ED Notes (Signed)
Psych NP to bedside
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
## 2024-02-23 ENCOUNTER — Emergency Department (HOSPITAL_BASED_OUTPATIENT_CLINIC_OR_DEPARTMENT_OTHER): Admitting: Radiology

## 2024-02-23 ENCOUNTER — Emergency Department (HOSPITAL_BASED_OUTPATIENT_CLINIC_OR_DEPARTMENT_OTHER)

## 2024-02-23 ENCOUNTER — Emergency Department (HOSPITAL_BASED_OUTPATIENT_CLINIC_OR_DEPARTMENT_OTHER): Admission: EM | Admit: 2024-02-23 | Discharge: 2024-02-23 | Disposition: A | Discharge status: Home / Self Care

## 2024-02-23 ENCOUNTER — Encounter (HOSPITAL_BASED_OUTPATIENT_CLINIC_OR_DEPARTMENT_OTHER): Payer: Self-pay

## 2024-02-23 DIAGNOSIS — J069 Acute upper respiratory infection, unspecified: Secondary | ICD-10-CM

## 2024-02-23 DIAGNOSIS — W19XXXA Unspecified fall, initial encounter: Secondary | ICD-10-CM | POA: Diagnosis not present

## 2024-02-23 DIAGNOSIS — I10 Essential (primary) hypertension: Secondary | ICD-10-CM | POA: Insufficient documentation

## 2024-02-23 DIAGNOSIS — S42401A Unspecified fracture of lower end of right humerus, initial encounter for closed fracture: Secondary | ICD-10-CM | POA: Diagnosis not present

## 2024-02-23 DIAGNOSIS — S0990XA Unspecified injury of head, initial encounter: Secondary | ICD-10-CM | POA: Insufficient documentation

## 2024-02-23 DIAGNOSIS — R059 Cough, unspecified: Secondary | ICD-10-CM | POA: Diagnosis not present

## 2024-02-23 DIAGNOSIS — E119 Type 2 diabetes mellitus without complications: Secondary | ICD-10-CM | POA: Insufficient documentation

## 2024-02-23 DIAGNOSIS — M25521 Pain in right elbow: Secondary | ICD-10-CM | POA: Diagnosis present

## 2024-02-23 DIAGNOSIS — J45909 Unspecified asthma, uncomplicated: Secondary | ICD-10-CM | POA: Diagnosis not present

## 2024-02-23 LAB — URINALYSIS, ROUTINE W REFLEX MICROSCOPIC
Bilirubin Urine: NEGATIVE
Glucose, UA: NEGATIVE mg/dL
Ketones, ur: NEGATIVE mg/dL
Nitrite: NEGATIVE
Protein, ur: NEGATIVE mg/dL
Specific Gravity, Urine: 1.018 (ref 1.005–1.030)
pH: 7 (ref 5.0–8.0)

## 2024-02-23 LAB — RESP PANEL BY RT-PCR (RSV, FLU A&B, COVID)  RVPGX2
Influenza A by PCR: NEGATIVE
Influenza B by PCR: NEGATIVE
Resp Syncytial Virus by PCR: NEGATIVE
SARS Coronavirus 2 by RT PCR: NEGATIVE

## 2024-02-23 MED ORDER — LORAZEPAM 1 MG PO TABS
0.5000 mg | ORAL_TABLET | Freq: Once | ORAL | Status: AC
  Administered 2024-02-23: 0.5 mg via ORAL
  Filled 2024-02-23: qty 1

## 2024-02-23 MED ORDER — ACETAMINOPHEN 325 MG PO TABS
650.0000 mg | ORAL_TABLET | Freq: Once | ORAL | Status: AC
  Administered 2024-02-23: 650 mg via ORAL
  Filled 2024-02-23: qty 2

## 2024-02-23 MED ORDER — CLONAZEPAM 0.5 MG PO TABS: 0.5000 mg | ORAL_TABLET | Freq: Once | ORAL | Status: DC

## 2024-02-23 NOTE — ED Provider Notes (Cosign Needed Addendum)
 " [place] [facility] AT [facility] Provider Note   CSN: [id number] Arrival date & time: 02/23/24  1138     Patient presents with: Becky [name] is a 69 y.o. female with PMHx HTN, osteopenia, asthma, anemia, chronic pain syndrome, DM, GERD, migraines who present to ED concerned for a fall last night around 11PM. Patient stating that she was doped up by medications when she went to help her dog into her bed. Patient's legs gave out from under her and she feel onto the floor and endorses hitting the front of her forehead. Endorses headache in front where she hit her head. Denies vision changes. Endorsing right elbow pain and mild BL hip pain after the fall.   Endorses mild dry cough and rhinorrhea x1 week. Patient stating that she has been feeling a little weaker over the past week, but attributed the fall to the Ativan  that she took yesterday afternoon at 3PM.  Weakness is feeling better this morning.   Denies fever, chest pain, dyspnea, nausea, vomiting, diarrhea, dysuria, hematuria.      Fall       Prior to Admission medications  Medication Sig Start Date End Date Taking? Authorizing Provider  clonazePAM  (KLONOPIN ) 0.5 MG tablet Take by mouth. 02/19/24  Yes [provider]  hydrochlorothiazide  (HYDRODIURIL ) 12.5 MG tablet Take 12.5 mg by mouth daily. 02/22/24  Yes [provider]  trazodone  (DESYREL ) 300 MG tablet Take 300 mg by mouth at bedtime. 12/15/23  Yes [provider]  Accu-Chek Softclix Lancets lancets 3 (three) times daily. for testing 12/21/21   [provider]  albuterol  (VENTOLIN  HFA) 108 (90 Base) MCG/ACT inhaler Inhale 2 puffs into the lungs every 4 (four) hours as needed for wheezing or shortness of breath. 03/21/22   Jeneal Danita Macintosh, MD  ATIVAN  0.5 MG tablet Take 0.5 mg by mouth 2 (two) times daily. 09/23/23   [provider]  Azelastine-Fluticasone  (DYMISTA) 137-50 MCG/ACT SUSP  PLACE 1 SPRAY INTO THE NOSE 2 (TWO) TIMES DAILY. 08/11/23   Jeneal Danita Macintosh, MD  Blood Glucose Monitoring Suppl (ACCU-CHEK GUIDE) w/Device KIT  12/19/21   [provider]  Blood Glucose Monitoring Suppl (GLUCOCOM BLOOD GLUCOSE MONITOR) DEVI Check blood sugars three times a day 12/19/21   [provider]  buPROPion  (WELLBUTRIN  XL) 150 MG 24 hr tablet Take 450 mg by mouth every morning. 05/08/22   [provider]  cholecalciferol  (VITAMIN D3) 25 MCG (1000 UNIT) tablet Take 1,000 Units by mouth daily.    [provider]  docusate sodium (COLACE) 100 MG capsule Take 200 mg by mouth daily.    [provider]  DULoxetine  (CYMBALTA ) 60 MG capsule Take 120 mg by mouth daily.    [provider]  EPIPEN  2-PAK 0.3 MG/0.3ML SOAJ injection Inject 0.3 mg into the muscle as needed for anaphylaxis. 04/05/14   [provider]  gabapentin  (NEURONTIN ) 300 MG capsule Take 300 mg by mouth 2 (two) times daily.    [provider]  hydrochlorothiazide  (MICROZIDE ) 12.5 MG capsule Take 12.5 mg by mouth daily.    [provider]  ibuprofen (ADVIL) 200 MG tablet Take 800 mg by mouth every 6 (six) hours as needed.    [provider]  INGREZZA 60 MG capsule Take 60 mg by mouth at bedtime. 09/12/23   [provider]  iron polysaccharides (NIFEREX) 150 MG capsule Take 150 mg by mouth daily. 03/05/23   [provider]  levocetirizine (  XYZAL ) 5 MG tablet Take 1 tablet (5 mg total) by mouth every evening. 03/21/23   Jeneal Danita Macintosh, MD  levothyroxine (SYNTHROID) 25 MCG tablet Take 25 mcg by mouth daily.    [provider]  Multiple Vitamin (MULTI VITAMIN PO) Take 1 tablet by mouth daily. Patient not taking: No sig reported    [provider]  nebivolol  (BYSTOLIC ) 5 MG tablet Take 5 mg by mouth daily. 09/08/23   [provider]  Olopatadine  HCl (PATADAY ) 0.2 % SOLN Place 1 drop into both eyes  daily as needed. 03/21/23   Jeneal Danita Macintosh, MD  Olopatadine -Mometasone  (RYALTRIS ) [phone] MCG/ACT SUSP Place 2 sprays into the nose 2 (two) times daily. 03/21/23   Jeneal Danita Macintosh, MD  omeprazole  (PRILOSEC) 40 MG capsule Take 40 mg by mouth daily. 04/19/19   [provider]  prednisoLONE acetate (PRED FORTE) 1 % ophthalmic suspension PLEASE SEE ATTACHED FOR DETAILED DIRECTIONS Patient not taking: No sig reported 07/23/23   [provider]  risperiDONE (RISPERDAL) 1 MG tablet Take 1 mg by mouth 3 (three) times daily. 03/17/23   [provider]  SYMBICORT  160-4.5 MCG/ACT inhaler Inhale 2 puffs into the lungs 2 (two) times daily. 03/21/23   Jeneal Danita Macintosh, MD  traZODone  (DESYREL ) 100 MG tablet Take 200 mg by mouth at bedtime.    [provider]  zolpidem  (AMBIEN ) 10 MG tablet Take 10 mg by mouth at bedtime as needed. 03/18/23   [provider]    Allergies: Methadone, Nitrofurantoin, Oxycodone-acetaminophen , Percocet [oxycodone-acetaminophen ], Penicillins, Amoxicillin, Aspirin, Clarithromycin, Codeine, Hydrocodone -acetaminophen , Morphine, Ms contin [morphine sulfate], and Propoxyphene    Review of Systems  Neurological:  Positive for weakness.    Updated Vital Signs BP 106/77   Pulse 62   Temp 98.3 F (36.8 C) (Oral)   Resp 12   SpO2 97%   Physical Exam Vitals and nursing note reviewed.  Constitutional:      General: She is not in acute distress.    Appearance: She is not ill-appearing or toxic-appearing.  HENT:     Head: Normocephalic and atraumatic.     Mouth/Throat:     Mouth: Mucous membranes are moist.  Eyes:     General: No scleral icterus.       Right eye: No discharge.        Left eye: No discharge.     Conjunctiva/sclera: Conjunctivae normal.  Cardiovascular:     Rate and Rhythm: Normal rate and regular rhythm.     Pulses: Normal pulses.     Heart sounds: Normal heart sounds. No murmur heard. Pulmonary:      Effort: Pulmonary effort is normal. No respiratory distress.     Breath sounds: Normal breath sounds. No wheezing, rhonchi or rales.  Abdominal:     General: Abdomen is flat. Bowel sounds are normal. There is no distension.     Palpations: Abdomen is soft. There is no mass.     Tenderness: There is no abdominal tenderness.  Musculoskeletal:     Right lower leg: No edema.     Left lower leg: No edema.     Comments: Right elbow: ROM intact. +2 radial pulse. Sensation to light touch intact. No erythema or swelling appreciated. Area non-tense. Some tenderness when palpating distal humerus.  Skin:    General: Skin is warm and dry.     Findings: No rash.  Neurological:     General: No focal deficit present.     Mental Status: She  is alert and oriented to person, place, and time. Mental status is at baseline.     Comments: GCS 15. Speech is goal oriented. No deficits appreciated to CN III-XII; symmetric eyebrow raise, no facial drooping, tongue midline. Patient has equal grip strength bilaterally with 5/5 strength against resistance in all major muscle groups bilaterally. Sensation to light touch intact. Patient moves extremities without ataxia.  Ambulatory with steady gait.  Psychiatric:        Mood and Affect: Mood normal.        Behavior: Behavior normal.     (all labs ordered are listed, but only abnormal results are displayed) Labs Reviewed  URINALYSIS, ROUTINE W REFLEX MICROSCOPIC - Abnormal; Notable for the following components:      Result Value   Hgb urine dipstick TRACE (*)    Leukocytes,Ua TRACE (*)    Bacteria, UA RARE (*)    All other components within normal limits  RESP PANEL BY RT-PCR (RSV, FLU A&B, COVID)  [id number]    EKG: EKG Interpretation Date/Time:  Monday February 23 2024 14:12:43 EST Ventricular Rate:  62 PR Interval:  120 QRS Duration:  98 QT Interval:  451 QTC Calculation: 458 R Axis:   40  Text Interpretation: Sinus rhythm Abnormal R-wave progression,  early transition Confirmed by Ruthe Cornet [phone]) on 02/23/2024 3:57:47 PM  Radiology: [facility] Elbow Complete Right Result Date: 02/23/2024 EXAM: 3 VIEW(S) XRAY OF THE ELBOW COMPARISON: None available. CLINICAL HISTORY: fall FINDINGS: BONES AND JOINTS: No acute fracture. No malalignment. Anterior joint effusion. Displacement of the anterior fat pad identified. SOFT TISSUES: Unremarkable. IMPRESSION: 1. Anterior elbow joint effusion with anterior fat pad displacement, concerning for occult fracture (most commonly radial head/neck); recommend immobilization and follow-up radiographs in 7-10 days. Electronically signed by: Waddell Calk MD MD 02/23/2024 03:09 PM EST RP Workstation: [id number]   CT Head Wo Contrast Result Date: 02/23/2024 EXAM: CT HEAD AND CERVICAL SPINE 02/23/2024 01:19:53 PM TECHNIQUE: CT of the head and cervical spine was performed without the administration of intravenous contrast. Multiplanar reformatted images are provided for review. Automated exposure control, iterative reconstruction, and/or weight based adjustment of the mA/kV was utilized to reduce the radiation dose to as low as reasonably achievable. COMPARISON: CT head 10/03/2020. Cervical spine radiographs 05/28/2017 CLINICAL HISTORY: Neck trauma (Age >= 65y) FINDINGS: CT HEAD Mild to moderately motion limited study. BRAIN AND VENTRICLES: No acute intracranial hemorrhage. No mass effect or midline shift. No abnormal extra-axial fluid collection. No evidence of acute infarct. No hydrocephalus. ORBITS: No acute abnormality. SINUSES AND MASTOIDS: No acute abnormality. SOFT TISSUES AND SKULL: No acute skull fracture. No acute soft tissue abnormality. CT CERVICAL SPINE BONES AND ALIGNMENT: No acute fracture or traumatic malalignment. Small probable limbus vertebra at C4 and C5 anterior/inferior vertebral bodies. Significant grade 1 degenerative anterolisthesis of C4 on C5. DEGENERATIVE CHANGES: Multilevel degenerative disc disease and  facet and uncovertebral hypertrophy with varying degrees of neural foraminal stenosis, likely the greatest on the left at C4-C5 . SOFT TISSUES: No prevertebral soft tissue swelling. IMPRESSION: 1. No acute intracranial abnormality on motion limited CT head. 2. No acute fracture or traumatic malalignment of the cervical spine. Electronically signed by: Gilmore Molt MD MD 02/23/2024 03:09 PM EST RP Workstation: [id number]   CT Cervical Spine Wo Contrast Result Date: 02/23/2024 EXAM: CT HEAD AND CERVICAL SPINE 02/23/2024 01:19:53 PM TECHNIQUE: CT of the head and cervical spine was performed without the administration of intravenous contrast. Multiplanar reformatted images are provided for review. Automated exposure  control, iterative reconstruction, and/or weight based adjustment of the mA/kV was utilized to reduce the radiation dose to as low as reasonably achievable. COMPARISON: CT head 10/03/2020. Cervical spine radiographs 05/28/2017 CLINICAL HISTORY: Neck trauma (Age >= 65y) FINDINGS: CT HEAD Mild to moderately motion limited study. BRAIN AND VENTRICLES: No acute intracranial hemorrhage. No mass effect or midline shift. No abnormal extra-axial fluid collection. No evidence of acute infarct. No hydrocephalus. ORBITS: No acute abnormality. SINUSES AND MASTOIDS: No acute abnormality. SOFT TISSUES AND SKULL: No acute skull fracture. No acute soft tissue abnormality. CT CERVICAL SPINE BONES AND ALIGNMENT: No acute fracture or traumatic malalignment. Small probable limbus vertebra at C4 and C5 anterior/inferior vertebral bodies. Significant grade 1 degenerative anterolisthesis of C4 on C5. DEGENERATIVE CHANGES: Multilevel degenerative disc disease and facet and uncovertebral hypertrophy with varying degrees of neural foraminal stenosis, likely the greatest on the left at C4-C5 . SOFT TISSUES: No prevertebral soft tissue swelling. IMPRESSION: 1. No acute intracranial abnormality on motion limited CT head. 2. No  acute fracture or traumatic malalignment of the cervical spine. Electronically signed by: Gilmore Molt MD MD 02/23/2024 03:09 PM EST RP Workstation: [id number]   DG Pelvis 1-2 Views Result Date: 02/23/2024 EXAM: 1 OR 2 VIEW(S) XRAY OF THE PELVIS 02/23/2024 01:27:53 PM COMPARISON: None available. CLINICAL HISTORY: fall FINDINGS: BONES AND JOINTS: Probable bone island in the proximal left femur. No acute fracture. No malalignment. SOFT TISSUES: Moderate retained stool within the imaged portions of the lower abdomen and pelvis. IMPRESSION: 1. No evidence of acute traumatic injury. Electronically signed by: Waddell Calk MD MD 02/23/2024 03:06 PM EST RP Workstation: [id number]   CT Chest W Contrast Result Date: 02/22/2024 EXAM: CT CHEST WITH CONTRAST 02/22/2024 07:34:49 PM TECHNIQUE: CT of the chest was performed with the administration of 75 mL of iohexol  (OMNIPAQUE ) 300 MG/ML solution. Multiplanar reformatted images are provided for review. Automated exposure control, iterative reconstruction, and/or weight based adjustment of the mA/kV was utilized to reduce the radiation dose to as low as reasonably achievable. COMPARISON: Partial comparison to CT abdomen dated 03/27/2021. CLINICAL HISTORY: Chest trauma, blunt. FINDINGS: MEDIASTINUM: Trace pericardial effusion. The central airways are clear. LYMPH NODES: No mediastinal, hilar or axillary lymphadenopathy. LUNGS AND PLEURA: No focal consolidation or pulmonary edema. No pleural effusion or pneumothorax. SOFT TISSUES/BONES: No acute abnormality of the bones or soft tissues. Sternum is intact. UPPER ABDOMEN: Small hiatal hernia with postsurgical changes at the GE junction. 2.5 cm left adrenal nodule (image 130), previously characterized as a benign adrenal adenoma, although mildly progressive. IMPRESSION: 1. No traumatic injury to the chest. 2. Benign left adrenal adenoma. Electronically signed by: Pinkie Pebbles MD MD 02/22/2024 07:47 PM EST RP Workstation:  [id number]     Procedures   Medications Ordered in the ED  acetaminophen  (TYLENOL ) tablet 650 mg (650 mg Oral Given 02/23/24 1254)  LORazepam  (ATIVAN ) tablet 0.5 mg (0.5 mg Oral Given 02/23/24 1528)                                    Medical Decision Making Amount and/or Complexity of Data Reviewed Labs: ordered. Radiology: ordered.  Risk OTC drugs. Prescription drug management.   This patient presents to the ED for concern of weakness, this involves an extensive number of treatment options, and is a complaint that carries with it a high risk of complications and morbidity.  The differential diagnosis includes Ischemic stroke, intracerebral hemorrhage, subarachnoid hemorrhage,  Guillain-Barr syndrome, hypoglycemia, electrolyte abnormality, sepsis, ACS, carbon monoxide poisoning, anemia, dehydration.   Co morbidities that complicate the patient evaluation  HTN, osteopenia, asthma, anemia, chronic pain syndrome, DM, GERD, migraines   Additional history obtained:  Dr. Nichole PCP 02/22/2024 CMP: Mild hyperglycemia at 122 but is otherwise reassuring. 02/22/2024 CBC: No leukocytosis or anemia 02/22/2024 troponin: Within normal limits   Problem List / ED Course / Critical interventions / Medication management  Patient presents to ED concern for fall last night.  Patient endorsing dry cough, rhinorrhea, mild generalized weakness over the past 1 week.  Some pain with ROM of right elbow and tenderness of distal humerus. Rest of Physical/neuroexam reassuring.  Patient afebrile with stable vitals. Patient ambulatory with steady gait. I Ordered, and personally interpreted labs.  UA not concerning for infection.  Respiratory panel negative.  Rest of lab workup obtained yesterday was reassuring. The patient was maintained on a cardiac monitor.  I personally viewed and interpreted the EKG/cardiac monitored which showed an underlying rhythm of: Sinus rhythm. I ordered imaging studies including  CT head/cervical spine, elbow/pelvis x-ray. I independently visualized and interpreted imaging which showed possible occult fracture of elbow.  No other acute process.  I agree with the radiologist interpretation. Patient becoming anxious while in ED and requiring a small dose of Ativan .  Patient also provided with Tylenol  for headache and arm pain. Placed patient in posterior long-arm splint.  Provided with information to follow-up with orthopedics. Patient concerned that her home medications might be making her feel a little weaker than usual. I stressed the importance of following up with PCP in the next couple of  days to re-evaluate medications and for re-check up. Patient may also be feeling a little weak d/t viral URI given rhinorrhea and cough recently. Overall, patient is well appearing and NAD.  Patient agreeable with plan. Staffed with Dr. Ruthe. I have reviewed the patients home medicines and have made adjustments as needed The patient has been appropriately medically screened and/or stabilized in the ED. I have low suspicion for any other emergent medical condition which would require further screening, evaluation or treatment in the ED or require inpatient management. At time of discharge the patient is hemodynamically stable and in no acute distress. I have discussed work-up results and diagnosis with patient and answered all questions. Patient is agreeable with discharge plan. We discussed strict return precautions for returning to the emergency department and they verbalized understanding.   Social Determinants of Health:  none      Final diagnoses:  Occult closed fracture of right elbow, initial encounter  Viral URI with cough    ED Discharge Orders     None            Hoy Nidia FALCON, [place] 02/23/24 1621  "

## 2024-02-23 NOTE — ED Triage Notes (Signed)
 Brought in by [facility] from home post fall sometime last night. Right arm pain. She called her neighbor this morning. Drowsy. Recent medication change and was seen at [facility] yesterday.

## 2024-02-23 NOTE — ED Notes (Signed)
 Sister to pick pt up

## 2024-02-23 NOTE — ED Notes (Signed)
Transported to CT and Xray

## 2024-02-23 NOTE — ED Notes (Signed)
 Pt reports that she takes klonopin  and is afraid that it is not helping her sleep, but possibly the reason she is falling

## 2024-02-23 NOTE — Discharge Instructions (Addendum)
 Please call the orthopedic office in the next 24 hours and tell them that you were seen in the emergency room and need follow-up appointment with them.  Please also follow-up with your primary care provider to manage your home medicines.  Seek emergency care if experiencing any new or worsening symptoms.  Alternating between 650 mg Tylenol  and 400 mg Advil: The best way to alternate taking Acetaminophen  (example Tylenol ) and Ibuprofen (example Advil/Motrin) is to take them 3 hours apart. For example, if you take ibuprofen at 6 am you can then take Tylenol  at 9 am. You can continue this regimen throughout the day, making sure you do not exceed the recommended maximum dose for each drug.

## 2024-02-28 ENCOUNTER — Emergency Department (HOSPITAL_COMMUNITY)
Admission: EM | Admit: 2024-02-28 | Discharge: 2024-02-28 | Disposition: A | Discharge status: Home / Self Care | Attending: Emergency Medicine | Admitting: Emergency Medicine

## 2024-02-28 DIAGNOSIS — F419 Anxiety disorder, unspecified: Secondary | ICD-10-CM

## 2024-02-28 DIAGNOSIS — E119 Type 2 diabetes mellitus without complications: Secondary | ICD-10-CM | POA: Diagnosis not present

## 2024-02-28 DIAGNOSIS — R45851 Suicidal ideations: Secondary | ICD-10-CM | POA: Insufficient documentation

## 2024-02-28 DIAGNOSIS — F1420 Cocaine dependence, uncomplicated: Secondary | ICD-10-CM

## 2024-02-28 DIAGNOSIS — F1920 Other psychoactive substance dependence, uncomplicated: Secondary | ICD-10-CM | POA: Diagnosis not present

## 2024-02-28 DIAGNOSIS — F4312 Post-traumatic stress disorder, chronic: Secondary | ICD-10-CM | POA: Diagnosis not present

## 2024-02-28 DIAGNOSIS — J4540 Moderate persistent asthma, uncomplicated: Secondary | ICD-10-CM | POA: Diagnosis not present

## 2024-02-28 DIAGNOSIS — G2401 Drug induced subacute dyskinesia: Secondary | ICD-10-CM | POA: Diagnosis not present

## 2024-02-28 DIAGNOSIS — Z87891 Personal history of nicotine dependence: Secondary | ICD-10-CM | POA: Insufficient documentation

## 2024-02-28 DIAGNOSIS — I10 Essential (primary) hypertension: Secondary | ICD-10-CM | POA: Insufficient documentation

## 2024-02-28 DIAGNOSIS — F4310 Post-traumatic stress disorder, unspecified: Secondary | ICD-10-CM | POA: Insufficient documentation

## 2024-02-28 DIAGNOSIS — Z79899 Other long term (current) drug therapy: Secondary | ICD-10-CM | POA: Diagnosis not present

## 2024-02-28 DIAGNOSIS — F410 Panic disorder [episodic paroxysmal anxiety] without agoraphobia: Secondary | ICD-10-CM | POA: Insufficient documentation

## 2024-02-28 LAB — CBC WITH DIFFERENTIAL/PLATELET
Abs Immature Granulocytes: 0.02 K/uL (ref 0.00–0.07)
Basophils Absolute: 0 K/uL (ref 0.0–0.1)
Basophils Relative: 0 %
Eosinophils Absolute: 0 K/uL (ref 0.0–0.5)
Eosinophils Relative: 0 %
HCT: 38 % (ref 36.0–46.0)
Hemoglobin: 13.1 g/dL (ref 12.0–15.0)
Immature Granulocytes: 0 %
Lymphocytes Relative: 12 %
Lymphs Abs: 0.8 K/uL (ref 0.7–4.0)
MCH: 31.5 pg (ref 26.0–34.0)
MCHC: 34.5 g/dL (ref 30.0–36.0)
MCV: 91.3 fL (ref 80.0–100.0)
Monocytes Absolute: 0.9 K/uL (ref 0.1–1.0)
Monocytes Relative: 13 %
Neutro Abs: 5 K/uL (ref 1.7–7.7)
Neutrophils Relative %: 75 %
Platelets: 261 K/uL (ref 150–400)
RBC: 4.16 MIL/uL (ref 3.87–5.11)
RDW: 12.8 % (ref 11.5–15.5)
WBC: 6.8 K/uL (ref 4.0–10.5)
nRBC: 0 % (ref 0.0–0.2)

## 2024-02-28 LAB — SALICYLATE LEVEL: Salicylate Lvl: <7 mg/dL — ABNORMAL LOW (ref 7.0–30.0)

## 2024-02-28 LAB — ACETAMINOPHEN LEVEL: Acetaminophen (Tylenol), Serum: <10 ug/mL — ABNORMAL LOW (ref 10–30)

## 2024-02-28 LAB — URINALYSIS, ROUTINE W REFLEX MICROSCOPIC
Bilirubin Urine: NEGATIVE
Glucose, UA: NEGATIVE mg/dL
Ketones, ur: NEGATIVE mg/dL
Leukocytes,Ua: NEGATIVE
Nitrite: NEGATIVE
Protein, ur: NEGATIVE mg/dL
Specific Gravity, Urine: 1.01 (ref 1.005–1.030)
pH: 7 (ref 5.0–8.0)

## 2024-02-28 LAB — COMPREHENSIVE METABOLIC PANEL WITH GFR
ALT: 21 U/L (ref 0–44)
AST: 44 U/L — ABNORMAL HIGH (ref 15–41)
Albumin: 4.6 g/dL (ref 3.5–5.0)
Alkaline Phosphatase: 79 U/L (ref 38–126)
Anion gap: 13 (ref 5–15)
BUN: 14 mg/dL (ref 8–23)
CO2: 26 mmol/L (ref 22–32)
Calcium: 9.8 mg/dL (ref 8.9–10.3)
Chloride: 92 mmol/L — ABNORMAL LOW (ref 98–111)
Creatinine, Ser: 0.83 mg/dL (ref 0.44–1.00)
GFR, Estimated: >60 mL/min
Glucose, Bld: 147 mg/dL — ABNORMAL HIGH (ref 70–99)
Potassium: 3.5 mmol/L (ref 3.5–5.1)
Sodium: 131 mmol/L — ABNORMAL LOW (ref 135–145)
Total Bilirubin: 0.3 mg/dL (ref 0.0–1.2)
Total Protein: 7.4 g/dL (ref 6.5–8.1)

## 2024-02-28 LAB — URINE DRUG SCREEN
Amphetamines: NEGATIVE
Barbiturates: NEGATIVE
Benzodiazepines: POSITIVE — AB
Cocaine: NEGATIVE
Fentanyl: NEGATIVE
Methadone Scn, Ur: NEGATIVE
Opiates: NEGATIVE
Tetrahydrocannabinol: NEGATIVE

## 2024-02-28 LAB — CBG MONITORING, ED: Glucose-Capillary: 144 mg/dL — ABNORMAL HIGH (ref 70–99)

## 2024-02-28 LAB — ETHANOL: Alcohol, Ethyl (B): <15 mg/dL

## 2024-02-28 MED ORDER — CLONAZEPAM 0.5 MG PO TABS: 0.5000 mg | ORAL_TABLET | Freq: Three times a day (TID) | ORAL | 0 refills | Status: AC | PRN

## 2024-02-28 MED ORDER — CLONAZEPAM 0.5 MG PO TABS
0.5000 mg | ORAL_TABLET | Freq: Once | ORAL | Status: AC
  Administered 2024-02-28: 0.5 mg via ORAL
  Filled 2024-02-28: qty 1

## 2024-02-28 NOTE — ED Notes (Addendum)
 Pt continues to come up to nursing desk and ask when medication will 'work'. Nurse educates and provide reassuring language.

## 2024-02-28 NOTE — ED Notes (Signed)
 Pt continues to get up and pace around bed. Pt has been fidgety since arrival

## 2024-02-28 NOTE — ED Notes (Signed)
 Pt wanded by security then escorted to rapid dispo room to get TTS evaluation.

## 2024-02-28 NOTE — ED Provider Notes (Cosign Needed Addendum)
 " [facility] [facility] Provider Note   CSN: [id number] Arrival date & time: 02/28/24  1343     Patient presents with: Anxiety and Suicidal   Becky [name] is a 69 y.o. female who presents to the ED with primary concern of repetitive panic attacks that have been ongoing over the last 24 hours.  She states has been taking her previous prescription of lorazepam  with no improvement in her symptoms, states that only sleep helps her resolution of anxiety and panic.  Denies any somatic symptoms, no chest pain, shortness of breath, abdominal pain, nausea or vomiting.  Denies any alcohol intake, denies any other substance intake, has been taking her regularly prescribed medications.  She does endorse SI stating that she plans to take a mixture of her prescribed medications.  She specifically requests psychiatric evaluation stating that previously she has been assessed and discharged and feels that she needs psychiatric admission.    Anxiety       Prior to Admission medications  Medication Sig Start Date End Date Taking? Authorizing Provider  clonazePAM  (KLONOPIN ) 0.5 MG tablet Take 1 tablet (0.5 mg total) by mouth 3 (three) times daily as needed for anxiety. 02/28/24  Yes Myriam Dorn BROCKS, PA  Accu-Chek Softclix Lancets lancets 3 (three) times daily. for testing 12/21/21   [provider]  albuterol  (VENTOLIN  HFA) 108 (90 Base) MCG/ACT inhaler Inhale 2 puffs into the lungs every 4 (four) hours as needed for wheezing or shortness of breath. 03/21/22   Jeneal Danita Macintosh, MD  Azelastine-Fluticasone  (DYMISTA) 137-50 MCG/ACT SUSP PLACE 1 SPRAY INTO THE NOSE 2 (TWO) TIMES DAILY. 08/11/23   Jeneal Danita Macintosh, MD  Blood Glucose Monitoring Suppl (ACCU-CHEK GUIDE) w/Device KIT  12/19/21   [provider]  Blood Glucose Monitoring Suppl (GLUCOCOM BLOOD GLUCOSE MONITOR) DEVI Check blood sugars three times a day 12/19/21   [provider]  buPROPion  (WELLBUTRIN  XL) 150 MG 24 hr tablet Take 450 mg by mouth every morning. 05/08/22   [provider]  cholecalciferol  (VITAMIN D3) 25 MCG (1000 UNIT) tablet Take 1,000 Units by mouth daily.    [provider]  docusate sodium (COLACE) 100 MG capsule Take 200 mg by mouth daily.    [provider]  DULoxetine  (CYMBALTA ) 60 MG capsule Take 120 mg by mouth daily.    [provider]  EPIPEN  2-PAK 0.3 MG/0.3ML SOAJ injection Inject 0.3 mg into the muscle as needed for anaphylaxis. 04/05/14   [provider]  gabapentin  (NEURONTIN ) 300 MG capsule Take 300 mg by mouth 2 (two) times daily.    [provider]  hydrochlorothiazide  (HYDRODIURIL ) 12.5 MG tablet Take 12.5 mg by mouth daily. 02/22/24   [provider]  hydrochlorothiazide  (MICROZIDE ) 12.5 MG capsule Take 12.5 mg by mouth daily.    [provider]  ibuprofen (ADVIL) 200 MG tablet Take 800 mg by mouth every 6 (six) hours as needed.    [provider]  INGREZZA  60 MG capsule Take 60 mg by mouth at bedtime. 09/12/23   [provider]  iron polysaccharides (NIFEREX) 150 MG capsule Take 150 mg by mouth daily. 03/05/23   [provider]  levocetirizine (XYZAL ) 5 MG tablet Take 1 tablet (5 mg total) by mouth every evening. 03/21/23   Jeneal Danita Macintosh, MD  levothyroxine  (SYNTHROID ) 25 MCG tablet Take 25 mcg by mouth daily.    [provider]  Multiple Vitamin (MULTI VITAMIN PO) Take 1 tablet  by mouth daily. Patient not taking: No sig reported    [provider]  nebivolol  (BYSTOLIC ) 5 MG tablet Take 5 mg by mouth daily. 09/08/23   [provider]  Olopatadine  HCl (PATADAY ) 0.2 % SOLN Place 1 drop into both eyes daily as needed. 03/21/23   Jeneal Danita Macintosh, MD  Olopatadine -Mometasone  (RYALTRIS ) [id number] MCG/ACT SUSP Place 2 sprays into the nose 2 (two) times daily. 03/21/23   Jeneal Danita Macintosh, MD   omeprazole  (PRILOSEC) 40 MG capsule Take 40 mg by mouth daily. 04/19/19   [provider]  prednisoLONE acetate (PRED FORTE) 1 % ophthalmic suspension PLEASE SEE ATTACHED FOR DETAILED DIRECTIONS Patient not taking: No sig reported 07/23/23   [provider]  risperiDONE  (RISPERDAL ) 1 MG tablet Take 1 mg by mouth 3 (three) times daily. 03/17/23   [provider]  SYMBICORT  160-4.5 MCG/ACT inhaler Inhale 2 puffs into the lungs 2 (two) times daily. 03/21/23   Jeneal Danita Macintosh, MD  traZODone  (DESYREL ) 100 MG tablet Take 200 mg by mouth at bedtime.    [provider]  trazodone  (DESYREL ) 300 MG tablet Take 300 mg by mouth at bedtime. 12/15/23   [provider]  zolpidem  (AMBIEN ) 10 MG tablet Take 10 mg by mouth at bedtime as needed. 03/18/23   [provider]    Allergies: Methadone, Nitrofurantoin, Oxycodone-acetaminophen , Percocet [oxycodone-acetaminophen ], Penicillins, Amoxicillin, Aspirin, Clarithromycin, Codeine, Hydrocodone -acetaminophen , Morphine, Ms contin [morphine sulfate], and Propoxyphene    Review of Systems  Psychiatric/Behavioral:  Positive for dysphoric mood and suicidal ideas. The patient is nervous/anxious.   All other systems reviewed and are negative.   Updated Vital Signs BP (!) 145/102 (BP Location: Right Arm)   Pulse 84   Temp 98.4 F (36.9 C) (Oral)   Resp 20   SpO2 95%   Physical Exam Vitals and nursing note reviewed.  Constitutional:      General: She is not in acute distress.    Appearance: Normal appearance.  HENT:     Head: Normocephalic and atraumatic.     Mouth/Throat:     Mouth: Mucous membranes are moist.     Pharynx: Oropharynx is clear.  Eyes:     Extraocular Movements: Extraocular movements intact.     Conjunctiva/sclera: Conjunctivae normal.     Pupils: Pupils are equal, round, and reactive to light.  Cardiovascular:     Rate and Rhythm: Normal rate and regular rhythm.     Pulses: Normal  pulses.     Heart sounds: Normal heart sounds. No murmur heard.    No friction rub. No gallop.  Pulmonary:     Effort: Pulmonary effort is normal.     Breath sounds: Normal breath sounds.  Abdominal:     General: Abdomen is flat. Bowel sounds are normal.     Palpations: Abdomen is soft.  Musculoskeletal:        General: Normal range of motion.     Cervical back: Normal range of motion and neck supple.     Right lower leg: No edema.     Left lower leg: No edema.  Skin:    General: Skin is warm and dry.     Capillary Refill: Capillary refill takes less than 2 seconds.  Neurological:     General: No focal deficit present.     Mental Status: She is alert and oriented to person, place, and time. Mental status is at baseline.     GCS: GCS eye subscore is 4. GCS verbal subscore  is 5. GCS motor subscore is 6.  Psychiatric:        Attention and Perception: Attention and perception normal. She does not perceive auditory or visual hallucinations.        Mood and Affect: Affect normal. Mood is anxious.        Speech: Speech normal.        Behavior: Behavior is cooperative.        Thought Content: Thought content includes suicidal ideation. Thought content includes suicidal plan.     (all labs ordered are listed, but only abnormal results are displayed) Labs Reviewed  COMPREHENSIVE METABOLIC PANEL WITH GFR - Abnormal; Notable for the following components:      Result Value   Sodium 131 (*)    Chloride 92 (*)    Glucose, Bld 147 (*)    AST 44 (*)    All other components within normal limits  URINE DRUG SCREEN - Abnormal; Notable for the following components:   Benzodiazepines POSITIVE (*)    All other components within normal limits  SALICYLATE LEVEL - Abnormal; Notable for the following components:   Salicylate Lvl <7.0 (*)    All other components within normal limits  ACETAMINOPHEN  LEVEL - Abnormal; Notable for the following components:   Acetaminophen  (Tylenol ), Serum <10 (*)     All other components within normal limits  URINALYSIS, ROUTINE W REFLEX MICROSCOPIC - Abnormal; Notable for the following components:   Hgb urine dipstick SMALL (*)    Bacteria, UA RARE (*)    All other components within normal limits  CBG MONITORING, ED - Abnormal; Notable for the following components:   Glucose-Capillary 144 (*)    All other components within normal limits  ETHANOL  CBC WITH DIFFERENTIAL/PLATELET    EKG: None  Radiology: No results found.   Procedures   Medications Ordered in the ED  clonazePAM  (KLONOPIN ) tablet 0.5 mg (0.5 mg Oral Given 02/28/24 1559)                                    Medical Decision Making Amount and/or Complexity of Data Reviewed Labs: ordered.  Risk Prescription drug management.   Medical Decision Making:   Becky [name] is a 69 y.o. female who presented to the ED today with anxiety and panic attacks/suicidal ideology detailed above.    Additional history discussed with patient's family/caregivers.  External chart has been reviewed including previous labs, imaging, admission record. Complete initial physical exam performed, notably the patient  was alert and oriented in no apparent distress.  Vocalizing SI, with plan..    Reviewed and confirmed nursing documentation for past medical history, family history, social history.    Initial Assessment:   With the patient's presentation of SI and panic attack, consider major depressive episode, akathisia secondary to antipsychotic use.  Initial Plan:  TOC consultation secondary to endorsed SI Screening labs including CBC and Metabolic panel to evaluate for infectious or metabolic etiology of disease.  Urinalysis with reflex culture ordered to evaluate for UTI or relevant urologic/nephrologic pathology.  For medical clearance, obtain UDS, acetaminophen  level, salicylate level, ethanol. EKG to evaluate for cardiac pathology Objective evaluation as below reviewed   Initial  Study Results:   Laboratory  All laboratory results reviewed without evidence of clinically relevant pathology.   Exceptions include: None  EKG EKG was reviewed independently. Rate, rhythm, axis, intervals all examined and without medically relevant abnormality.  ST segments without concerns for elevations.       Consults: Case discussed with Dr Delene, psychiatry.   Reassessment and Plan:   After consultation with psychiatry, she did not endorse SI during her interview with psychiatry, and they believe that this is likely akathisia secondary to the second generation antipsychotic use.  Their plan for right now is for patient to be discharged home with prescription for Klonopin , discontinue Ativan , and have her follow-up with her PCP for further consultation, likely will need pharmacy consultation for reevaluation of meds.  Discussed this with the patient and she understands agrees has no further concerns at this time thus will be discharged with outpatient follow-up.       Final diagnoses:  Anxiety  Panic attack    ED Discharge Orders          Ordered    clonazePAM  (KLONOPIN ) 0.5 MG tablet  3 times daily PRN        02/28/24 1825    pharmacy consult       Provider:  (Not yet assigned)   02/28/24 1838               Myriam Dorn BROCKS, PA 02/28/24 1837    Myriam Dorn BROCKS, [place] 02/28/24 1838  "

## 2024-02-28 NOTE — ED Notes (Signed)
 Pt unable to provide urine,  gave drink to pt
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
## 2024-02-28 NOTE — ED Notes (Signed)
 Pt belongings in 48

## 2024-02-28 NOTE — ED Triage Notes (Signed)
 Pt BIB EMS from home for an anxiety attack, pt has hx of the same, is taking meds with no relief.  When asked SI questions pt reports she told her therapist today that she wanted to hurt herself, pt is positive on SI questions.

## 2024-03-02 ENCOUNTER — Emergency Department (HOSPITAL_COMMUNITY)

## 2024-03-02 ENCOUNTER — Ambulatory Visit (HOSPITAL_COMMUNITY)
Admission: EM | Admit: 2024-03-02 | Discharge: 2024-03-02 | Disposition: A | Discharge status: Other Acute Inpatient Hospital | Source: Home / Self Care

## 2024-03-02 ENCOUNTER — Emergency Department (HOSPITAL_COMMUNITY)
Admission: EM | Admit: 2024-03-02 | Discharge: 2024-03-03 | Disposition: A | Discharge status: Other Acute Inpatient Hospital | Attending: Emergency Medicine | Admitting: Emergency Medicine

## 2024-03-02 ENCOUNTER — Other Ambulatory Visit: Payer: Self-pay

## 2024-03-02 ENCOUNTER — Encounter (HOSPITAL_COMMUNITY): Payer: Self-pay

## 2024-03-02 DIAGNOSIS — Z9181 History of falling: Secondary | ICD-10-CM | POA: Diagnosis not present

## 2024-03-02 DIAGNOSIS — F411 Generalized anxiety disorder: Secondary | ICD-10-CM

## 2024-03-02 DIAGNOSIS — G3184 Mild cognitive impairment, so stated: Secondary | ICD-10-CM | POA: Diagnosis not present

## 2024-03-02 DIAGNOSIS — E119 Type 2 diabetes mellitus without complications: Secondary | ICD-10-CM | POA: Insufficient documentation

## 2024-03-02 DIAGNOSIS — I10 Essential (primary) hypertension: Secondary | ICD-10-CM | POA: Diagnosis not present

## 2024-03-02 DIAGNOSIS — Z79899 Other long term (current) drug therapy: Secondary | ICD-10-CM | POA: Diagnosis present

## 2024-03-02 DIAGNOSIS — N3001 Acute cystitis with hematuria: Secondary | ICD-10-CM

## 2024-03-02 DIAGNOSIS — F3181 Bipolar II disorder: Secondary | ICD-10-CM | POA: Diagnosis not present

## 2024-03-02 DIAGNOSIS — R2681 Unsteadiness on feet: Secondary | ICD-10-CM | POA: Diagnosis not present

## 2024-03-02 DIAGNOSIS — Z0279 Encounter for issue of other medical certificate: Secondary | ICD-10-CM | POA: Insufficient documentation

## 2024-03-02 DIAGNOSIS — R296 Repeated falls: Secondary | ICD-10-CM | POA: Diagnosis not present

## 2024-03-02 DIAGNOSIS — F419 Anxiety disorder, unspecified: Secondary | ICD-10-CM | POA: Insufficient documentation

## 2024-03-02 DIAGNOSIS — R450 Nervousness: Secondary | ICD-10-CM | POA: Diagnosis not present

## 2024-03-02 DIAGNOSIS — R4589 Other symptoms and signs involving emotional state: Secondary | ICD-10-CM

## 2024-03-02 DIAGNOSIS — D72819 Decreased white blood cell count, unspecified: Secondary | ICD-10-CM | POA: Insufficient documentation

## 2024-03-02 DIAGNOSIS — Z008 Encounter for other general examination: Secondary | ICD-10-CM

## 2024-03-02 DIAGNOSIS — Z760 Encounter for issue of repeat prescription: Secondary | ICD-10-CM | POA: Diagnosis not present

## 2024-03-02 DIAGNOSIS — Z602 Problems related to living alone: Secondary | ICD-10-CM | POA: Diagnosis not present

## 2024-03-02 DIAGNOSIS — F4323 Adjustment disorder with mixed anxiety and depressed mood: Secondary | ICD-10-CM | POA: Diagnosis not present

## 2024-03-02 DIAGNOSIS — F32A Depression, unspecified: Secondary | ICD-10-CM | POA: Insufficient documentation

## 2024-03-02 DIAGNOSIS — F4310 Post-traumatic stress disorder, unspecified: Secondary | ICD-10-CM | POA: Diagnosis not present

## 2024-03-02 DIAGNOSIS — G4700 Insomnia, unspecified: Secondary | ICD-10-CM | POA: Diagnosis not present

## 2024-03-02 DIAGNOSIS — G4709 Other insomnia: Secondary | ICD-10-CM | POA: Diagnosis not present

## 2024-03-02 DIAGNOSIS — H8110 Benign paroxysmal vertigo, unspecified ear: Secondary | ICD-10-CM | POA: Diagnosis not present

## 2024-03-02 LAB — CBC WITH DIFFERENTIAL/PLATELET
Abs Immature Granulocytes: 0.03 K/uL (ref 0.00–0.07)
Basophils Absolute: 0 K/uL (ref 0.0–0.1)
Basophils Relative: 1 %
Eosinophils Absolute: 0.1 K/uL (ref 0.0–0.5)
Eosinophils Relative: 1 %
HCT: 39.1 % (ref 36.0–46.0)
Hemoglobin: 13.1 g/dL (ref 12.0–15.0)
Immature Granulocytes: 1 %
Lymphocytes Relative: 18 %
Lymphs Abs: 1.2 K/uL (ref 0.7–4.0)
MCH: 31.3 pg (ref 26.0–34.0)
MCHC: 33.5 g/dL (ref 30.0–36.0)
MCV: 93.3 fL (ref 80.0–100.0)
Monocytes Absolute: 1.2 K/uL — ABNORMAL HIGH (ref 0.1–1.0)
Monocytes Relative: 19 %
Neutro Abs: 3.9 K/uL (ref 1.7–7.7)
Neutrophils Relative %: 60 %
Platelets: 262 K/uL (ref 150–400)
RBC: 4.19 MIL/uL (ref 3.87–5.11)
RDW: 13.1 % (ref 11.5–15.5)
WBC: 6.4 K/uL (ref 4.0–10.5)
nRBC: 0 % (ref 0.0–0.2)

## 2024-03-02 LAB — SALICYLATE LEVEL: Salicylate Lvl: <7 mg/dL — ABNORMAL LOW (ref 7.0–30.0)

## 2024-03-02 LAB — COMPREHENSIVE METABOLIC PANEL WITH GFR
ALT: 17 U/L (ref 0–44)
AST: 36 U/L (ref 15–41)
Albumin: 4.3 g/dL (ref 3.5–5.0)
Alkaline Phosphatase: 81 U/L (ref 38–126)
Anion gap: 10 (ref 5–15)
BUN: 14 mg/dL (ref 8–23)
CO2: 30 mmol/L (ref 22–32)
Calcium: 9.5 mg/dL (ref 8.9–10.3)
Chloride: 94 mmol/L — ABNORMAL LOW (ref 98–111)
Creatinine, Ser: 0.85 mg/dL (ref 0.44–1.00)
GFR, Estimated: >60 mL/min
Glucose, Bld: 91 mg/dL (ref 70–99)
Potassium: 3.8 mmol/L (ref 3.5–5.1)
Sodium: 133 mmol/L — ABNORMAL LOW (ref 135–145)
Total Bilirubin: 0.3 mg/dL (ref 0.0–1.2)
Total Protein: 7.1 g/dL (ref 6.5–8.1)

## 2024-03-02 LAB — GLUCOSE, CAPILLARY: Glucose-Capillary: 310 mg/dL — ABNORMAL HIGH (ref 70–99)

## 2024-03-02 LAB — ETHANOL: Alcohol, Ethyl (B): <15 mg/dL

## 2024-03-02 LAB — ACETAMINOPHEN LEVEL: Acetaminophen (Tylenol), Serum: <10 ug/mL — ABNORMAL LOW (ref 10–30)

## 2024-03-02 MED ORDER — LORAZEPAM 1 MG PO TABS
ORAL_TABLET | ORAL | Status: AC
  Administered 2024-03-02: 1 mg via ORAL
  Filled 2024-03-02: qty 1

## 2024-03-02 MED ORDER — LORAZEPAM 1 MG PO TABS: 1.0000 mg | ORAL_TABLET | Freq: Once | ORAL | Status: AC

## 2024-03-02 NOTE — ED Triage Notes (Addendum)
 Patient arrives from [facility] EMS called because the patient thought she was having stroke like symptoms. Patient was sent here for medical clearance. Reports frequent falls. Last fall was Monday, last week,  and diagnosed with fractured elbow on the right arm. Patient is not wearing a cast or splint. Complains of mild neck pain. Patient had not fallen while at [facility].   Patient states she is here because something is going on with my medications. Patient states her NP sent her to [facility] for a medical and psych evaluation.  Patient appears restless and anxious.    LKW: a couple weeks ago.  Reports headache began last week, Sunday

## 2024-03-02 NOTE — ED Provider Notes (Signed)
 Behavioral Health Urgent Care Medical Screening Exam  Patient Name: Becky [name] MRN: [id number] Date of Evaluation: 03/02/24 Chief Complaint:   Diagnosis:  Final diagnoses:  At high risk for falls  Anxiety state    History of Present illness: Becky [name] is a 69 y.o. female.  Per triage: Becky [name] 68y female presents to [facility] accompanied by her neighbor. PT states that she has bipolar type 2 and has been experiencing worsening symptoms and doesn't feel stable. PT has been feeling like this for the past 2 weeks and was encouraged to come here by her current therapist. PT denied recent SI, HI, AVH and alcohol and substance use.   Patient's neighbor who brought her here reports that patient has had multiple falls. Last night she had 2 falls but there were no medical interventions. Neighbor has called EMS in the past but nothing was done. Patient lives alone.   Upon face-to-face evaluation, patient is sitting in the wheelchair, shaking, restless and frequently getting up and walking around with unsteady gait. She is alert and oriented but appears tired and worried.  Her thought process is coherent and speech is clear. She reports that she has not been feeling right and someone told her to come here, that she might have had a stroke.   Patient reports a hx of of mental illness including Anxiety, depression, PTSD, and has multiple medical conditions including HTN, DM, malnutrition. Chart review indicates hx of psychiatric medications including Risperidone , Trazodone , Ambien , Gabapentin , Cymbalta  and Klonopin .  Patient denies SI/HI/AVH and states she is not sure what has happened to her they said I might have had a stroke. Reports poor appetite and poor sleep. Reports she lives alone and admits to multiple falls, reported and non-reported.  Had 2 falls last night.   We are recommending medical clearance. Patient to be transferred to [facility] ED. Accepted by Fonda Siva, PA.    Flowsheet Row ED from 03/02/2024 in [facility] ED from 02/28/2024 in [facility] Emergency Department at [facility] ED from 02/23/2024 in [facility] Emergency Department at [facility]  C-SSRS RISK CATEGORY Moderate Risk High Risk No Risk    Psychiatric Specialty Exam  Presentation  General Appearance:Appropriate for Environment  Eye Contact:Fair  Speech:Clear and Coherent  Speech Volume:Decreased  Handedness:Right   Mood and Affect  Mood: Anxious  Affect: Other (comment) (worried)   Thought Process  Thought Processes: Coherent  Descriptions of Associations:Intact  Orientation:Full (Time, Place and Person)  Thought Content:WDL  Diagnosis of Schizophrenia or Schizoaffective disorder in past: No data recorded Duration of Psychotic Symptoms: No data recorded Hallucinations:None  Ideas of Reference:None  Suicidal Thoughts:No  Homicidal Thoughts:No   Sensorium  Memory: Immediate Fair; Recent Fair; Remote Fair  Judgment: Fair  Insight: Fair   Art Therapist  Concentration: Fair  Attention Span: Fair  Recall: [employer] of Knowledge: Fair  Language: Fair   Psychomotor Activity  Psychomotor Activity: Restlessness; Tremor; Other (comment) (unsteady gait)   Assets  Assets: Communication Skills; Desire for Improvement   Sleep  Sleep: Poor  Number of hours: No data recorded  Physical Exam: Physical Exam Vitals and nursing note reviewed.  HENT:     Head: Normocephalic.     Right Ear: Tympanic membrane normal.     Left Ear: Tympanic membrane normal.     Nose: Nose normal.  Eyes:     Extraocular Movements: Extraocular movements intact.     Pupils: Pupils are  equal, round, and reactive to light.  Cardiovascular:     Rate and Rhythm: Normal rate.     Pulses: Normal pulses.  Pulmonary:     Effort: Pulmonary effort is normal.  Musculoskeletal:        General: Normal  range of motion.  Neurological:     General: No focal deficit present.     Mental Status: She is alert and oriented to person, place, and time.  Psychiatric:        Thought Content: Thought content normal.    Review of Systems  Constitutional: Negative.   HENT: Negative.    Eyes: Negative.   Respiratory: Negative.    Cardiovascular: Negative.        HTN  Gastrointestinal: Negative.   Genitourinary: Negative.   Musculoskeletal:  Positive for falls.  Skin: Negative.   Neurological:  Positive for tremors and weakness.  Endo/Heme/Allergies: Negative.        DM  Psychiatric/Behavioral:  Positive for depression. The patient is nervous/anxious and has insomnia.    Blood pressure (!) 156/79, pulse 85, temperature 98.7 F (37.1 C), temperature source Oral, resp. rate 17, SpO2 97%. There is no height or weight on file to calculate BMI.  Musculoskeletal: Strength & Muscle Tone: decreased Gait & Station: unsteady Patient leans: Right and Left   [facility] MSE Discharge Disposition for Follow up and Recommendations: Based on my evaluation the patient appears to have an emergency medical condition for which I recommend the patient be transferred to the emergency department for further evaluation.    Randall Bouquet, NP 03/02/2024, 7:22 PM

## 2024-03-02 NOTE — ED Notes (Signed)
 EMS Transport requested.

## 2024-03-02 NOTE — ED Provider Notes (Signed)
 " [place] [facility] Provider Note   CSN: [id number] Arrival date & time: 03/02/24  2023     Patient presents with: No chief complaint on file.   Becky [name] is a 69 y.o. female.  69 year old female presents emergency department from behavioral health urgent care with complaints of multiple falls, increased anxiety, and feeling unstable.  Someone advised her that she may have had a stroke and wanted her evaluated.  Patient reports these falls have been occurring for several weeks and she does not have any weakness. patient was advised to go to behavioral health urgent care by her therapist.  Patient has a history of anxiety, depression, PTSD, hypertension, diabetes.  Patient is currently taking risperidone , trazodone , Ambien , gabapentin , Cymbalta  and Klonopin .  Patient denies any SI, HI.  Patient is sent here for medical clearance for behavioral health urgent care.    Prior to Admission medications  Medication Sig Start Date End Date Taking? Authorizing Provider  Accu-Chek Softclix Lancets lancets 3 (three) times daily. for testing 12/21/21   [provider]  albuterol  (VENTOLIN  HFA) 108 (90 Base) MCG/ACT inhaler Inhale 2 puffs into the lungs every 4 (four) hours as needed for wheezing or shortness of breath. 03/21/22   Jeneal Danita Macintosh, MD  Azelastine-Fluticasone  (DYMISTA) 137-50 MCG/ACT SUSP PLACE 1 SPRAY INTO THE NOSE 2 (TWO) TIMES DAILY. 08/11/23   Jeneal Danita Macintosh, MD  Blood Glucose Monitoring Suppl (ACCU-CHEK GUIDE) w/Device KIT  12/19/21   [provider]  Blood Glucose Monitoring Suppl (GLUCOCOM BLOOD GLUCOSE MONITOR) DEVI Check blood sugars three times a day 12/19/21   [provider]  buPROPion  (WELLBUTRIN  XL) 150 MG 24 hr tablet Take 450 mg by mouth every morning. 05/08/22   [provider]  cholecalciferol  (VITAMIN D3) 25 MCG (1000 UNIT) tablet Take 1,000 Units by mouth daily.     [provider]  clonazePAM  (KLONOPIN ) 0.5 MG tablet Take 1 tablet (0.5 mg total) by mouth 3 (three) times daily as needed for anxiety. 02/28/24   Myriam Dorn BROCKS, PA  docusate sodium (COLACE) 100 MG capsule Take 200 mg by mouth daily.    [provider]  DULoxetine  (CYMBALTA ) 60 MG capsule Take 120 mg by mouth daily.    [provider]  EPIPEN  2-PAK 0.3 MG/0.3ML SOAJ injection Inject 0.3 mg into the muscle as needed for anaphylaxis. 04/05/14   [provider]  gabapentin  (NEURONTIN ) 300 MG capsule Take 300 mg by mouth 2 (two) times daily.    [provider]  hydrochlorothiazide  (HYDRODIURIL ) 12.5 MG tablet Take 12.5 mg by mouth daily. 02/22/24   [provider]  hydrochlorothiazide  (MICROZIDE ) 12.5 MG capsule Take 12.5 mg by mouth daily.    [provider]  ibuprofen (ADVIL) 200 MG tablet Take 800 mg by mouth every 6 (six) hours as needed.    [provider]  INGREZZA  60 MG capsule Take 60 mg by mouth at bedtime. 09/12/23   [provider]  iron polysaccharides (NIFEREX) 150 MG capsule Take 150 mg by mouth daily. 03/05/23   [provider]  levocetirizine (XYZAL ) 5 MG tablet Take 1 tablet (5 mg total) by mouth every evening. 03/21/23   Jeneal Danita Macintosh, MD  levothyroxine  (SYNTHROID ) 25 MCG tablet Take 25 mcg by mouth daily.    [provider]  Multiple Vitamin (MULTI VITAMIN PO) Take 1 tablet by mouth daily. Patient not taking: No sig reported    [provider]  nebivolol  (  BYSTOLIC ) 5 MG tablet Take 5 mg by mouth daily. 09/08/23   [provider]  Olopatadine  HCl (PATADAY ) 0.2 % SOLN Place 1 drop into both eyes daily as needed. 03/21/23   Jeneal Danita Macintosh, MD  Olopatadine -Mometasone  (RYALTRIS ) [phone] MCG/ACT SUSP Place 2 sprays into the nose 2 (two) times daily. 03/21/23   Jeneal Danita Macintosh, MD  omeprazole  (PRILOSEC) 40 MG capsule Take 40 mg by mouth daily. 04/19/19    [provider]  prednisoLONE acetate (PRED FORTE) 1 % ophthalmic suspension PLEASE SEE ATTACHED FOR DETAILED DIRECTIONS Patient not taking: No sig reported 07/23/23   [provider]  risperiDONE  (RISPERDAL ) 1 MG tablet Take 1 mg by mouth 3 (three) times daily. 03/17/23   [provider]  SYMBICORT  160-4.5 MCG/ACT inhaler Inhale 2 puffs into the lungs 2 (two) times daily. 03/21/23   Jeneal Danita Macintosh, MD  traZODone  (DESYREL ) 100 MG tablet Take 200 mg by mouth at bedtime.    [provider]  trazodone  (DESYREL ) 300 MG tablet Take 300 mg by mouth at bedtime. 12/15/23   [provider]  zolpidem  (AMBIEN ) 10 MG tablet Take 10 mg by mouth at bedtime as needed. 03/18/23   [provider]    Allergies: Methadone, Nitrofurantoin, Oxycodone-acetaminophen , Percocet [oxycodone-acetaminophen ], Penicillins, Amoxicillin, Aspirin, Clarithromycin, Codeine, Hydrocodone -acetaminophen , Morphine, Ms contin [morphine sulfate], and Propoxyphene    Review of Systems  Psychiatric/Behavioral:  The patient is nervous/anxious.   All other systems reviewed and are negative.   Updated Vital Signs BP (!) 162/81 (BP Location: Left Arm)   Pulse 83   Temp 98.4 F (36.9 C) (Oral)   Resp 20   Ht 5' (1.524 m)   Wt 63.5 kg   SpO2 99%   BMI 27.34 kg/m   Physical Exam Vitals and nursing note reviewed.  Constitutional:      General: She is not in acute distress.    Appearance: Normal appearance. She is not ill-appearing, toxic-appearing or diaphoretic.  HENT:     Head: Normocephalic and atraumatic.     Nose: Nose normal.  Eyes:     Extraocular Movements: Extraocular movements intact.     Conjunctiva/sclera: Conjunctivae normal.     Pupils: Pupils are equal, round, and reactive to light.  Cardiovascular:     Rate and Rhythm: Normal rate and regular rhythm.  Pulmonary:     Effort: Pulmonary effort is normal. No respiratory distress.     Breath sounds: Normal  breath sounds.     Comments: Lungs are clear to auscultation in all fields Abdominal:     Tenderness: There is no abdominal tenderness. There is no guarding.  Musculoskeletal:        General: Normal range of motion.     Cervical back: Normal range of motion. No rigidity or tenderness.     Comments: Patient had concern for right elbow fracture approximately 8 days ago.  She has full sensation.  Radial left forearm with equal strength, full range of motion with no reported pain to palpation.  Skin:    General: Skin is warm.     Capillary Refill: Capillary refill takes less than 2 seconds.     Findings: No bruising.  Neurological:     General: No focal deficit present.     Mental Status: She is alert.     Comments: No focal deficits noted on neuroexam.  Patient is ambulatory in ED without assistance or difficulty.  Patient does appear restless.  Psychiatric:  Mood and Affect: Mood normal.        Behavior: Behavior normal.     (all labs ordered are listed, but only abnormal results are displayed) Labs Reviewed  ACETAMINOPHEN  LEVEL - Abnormal; Notable for the following components:      Result Value   Acetaminophen  (Tylenol ), Serum <10 (*)    All other components within normal limits  SALICYLATE LEVEL - Abnormal; Notable for the following components:   Salicylate Lvl <7.0 (*)    All other components within normal limits  CBC WITH DIFFERENTIAL/PLATELET - Abnormal; Notable for the following components:   Monocytes Absolute 1.2 (*)    All other components within normal limits  COMPREHENSIVE METABOLIC PANEL WITH GFR - Abnormal; Notable for the following components:   Sodium 133 (*)    Chloride 94 (*)    All other components within normal limits  ETHANOL  URINALYSIS, ROUTINE W REFLEX MICROSCOPIC  URINE DRUG SCREEN  CBG MONITORING, ED    EKG: None  Radiology: DG Elbow Complete Right Result Date: 03/02/2024 CLINICAL DATA:  Abnormal prior x-ray EXAM: RIGHT ELBOW - COMPLETE 3+  VIEW COMPARISON:  02/23/2024 FINDINGS: Decreased elbow effusion. No definitive fracture or malalignment. No periostitis or sclerosis to suggest a healing fracture IMPRESSION: Decreased elbow effusion. No definitive fracture is seen. Electronically Signed   By: Luke Bun M.D.   On: 03/02/2024 22:55   CT Head Wo Contrast Result Date: 03/02/2024 EXAM: CT HEAD WITHOUT CONTRAST 03/02/2024 10:33:00 PM TECHNIQUE: CT of the head was performed without the administration of intravenous contrast. Automated exposure control, iterative reconstruction, and/or weight based adjustment of the mA/kV was utilized to reduce the radiation dose to as low as reasonably achievable. COMPARISON: 02/23/2024 CLINICAL HISTORY: Neuro deficit, acute, stroke suspected FINDINGS: BRAIN AND VENTRICLES: No acute hemorrhage. No evidence of acute infarct. No hydrocephalus. No extra-axial collection. No mass effect or midline shift. Mild subcortical and periventricular small vessel ischemic changes. ORBITS: No acute abnormality. SINUSES: No acute abnormality. SOFT TISSUES AND SKULL: No acute soft tissue abnormality. No skull fracture. VASCULATURE: Intracranial atherosclerosis. IMPRESSION: 1. No acute intracranial abnormality. Electronically signed by: Pinkie Pebbles MD 03/02/2024 10:40 PM EST RP Workstation: [id number]     Procedures   Medications Ordered in the ED  LORazepam  (ATIVAN ) tablet 1 mg (1 mg Oral Given 03/02/24 2303)    69 y.o. female presents to the ED with complaints of increased anxiety with several reported falls over the last 2 weeks, patient presents for medical clearance for [facility].  Concern for substance abuse, ETOH, SI/HI, CVA, TIA, anxiety, panic attack (Ddx)  On arrival pt is nontoxic, vitals unremarkable. Exam is significant for patient anxiously pacing, no neurodeficits noted.  I ordered medication Ativan  for anxiety, patient has prescribed Ativan  for anxiety.  Lab Tests:  Salicylate levels, acetaminophen   levels, ethanol, UDS, CMP, CBC, UA, CBG. CMP shows sodium at 133 no other concerning findings on lab work.   Imaging Studies ordered:  I ordered imaging studies which included CT head without contrast CT showed no acute abnormalities.  Patient has no neurodeficits noted on exam.  ED Course:   Patient appears anxious on initial exam but is in no obvious distress.  Patient is pacing back-and-forth in front of the ED bed and reports that she feels very anxious but does not feel weak.  Patient denies any SI or HI.  Patient reports she was sent here because someone told her she might of had a stroke.  Will obtain CT head without contrast  and basic labs for further evaluation.  CT was negative for any concerning findings.  All other lab work is unremarkable.  Pending collection of urine for medical clearance.  Pending urine collection patient care transferred to [facility]. If clear patient will be transferred to [facility] for evaluation.    Portions of this note were generated with Scientist, clinical (histocompatibility and immunogenetics). Dictation errors may occur despite best attempts at proofreading.   Final diagnoses:  Medical clearance for psychiatric admission    ED Discharge Orders     None          Myriam Fonda RAMAN, [place] 03/02/24 2355  "

## 2024-03-02 NOTE — ED Notes (Signed)
 Report called to Charge Nurse Delon, [facility].  Pending EMS Transfer.

## 2024-03-03 ENCOUNTER — Ambulatory Visit (INDEPENDENT_AMBULATORY_CARE_PROVIDER_SITE_OTHER)
Admission: EM | Admit: 2024-03-03 | Discharge: 2024-03-04 | Disposition: A | Discharge status: Other Acute Inpatient Hospital | Source: Intra-hospital

## 2024-03-03 ENCOUNTER — Other Ambulatory Visit: Payer: Self-pay

## 2024-03-03 DIAGNOSIS — I10 Essential (primary) hypertension: Secondary | ICD-10-CM | POA: Insufficient documentation

## 2024-03-03 DIAGNOSIS — Z79899 Other long term (current) drug therapy: Secondary | ICD-10-CM | POA: Insufficient documentation

## 2024-03-03 DIAGNOSIS — H8110 Benign paroxysmal vertigo, unspecified ear: Secondary | ICD-10-CM | POA: Insufficient documentation

## 2024-03-03 DIAGNOSIS — F4323 Adjustment disorder with mixed anxiety and depressed mood: Secondary | ICD-10-CM | POA: Insufficient documentation

## 2024-03-03 DIAGNOSIS — F4310 Post-traumatic stress disorder, unspecified: Secondary | ICD-10-CM | POA: Insufficient documentation

## 2024-03-03 DIAGNOSIS — F1320 Sedative, hypnotic or anxiolytic dependence, uncomplicated: Secondary | ICD-10-CM

## 2024-03-03 DIAGNOSIS — R4589 Other symptoms and signs involving emotional state: Secondary | ICD-10-CM | POA: Diagnosis not present

## 2024-03-03 DIAGNOSIS — Z760 Encounter for issue of repeat prescription: Secondary | ICD-10-CM | POA: Insufficient documentation

## 2024-03-03 DIAGNOSIS — F419 Anxiety disorder, unspecified: Secondary | ICD-10-CM | POA: Insufficient documentation

## 2024-03-03 DIAGNOSIS — Z9181 History of falling: Secondary | ICD-10-CM | POA: Insufficient documentation

## 2024-03-03 DIAGNOSIS — G3184 Mild cognitive impairment, so stated: Secondary | ICD-10-CM | POA: Insufficient documentation

## 2024-03-03 DIAGNOSIS — G4709 Other insomnia: Secondary | ICD-10-CM | POA: Insufficient documentation

## 2024-03-03 DIAGNOSIS — F3181 Bipolar II disorder: Secondary | ICD-10-CM | POA: Insufficient documentation

## 2024-03-03 LAB — URINALYSIS, ROUTINE W REFLEX MICROSCOPIC
Bilirubin Urine: NEGATIVE
Glucose, UA: NEGATIVE mg/dL
Ketones, ur: NEGATIVE mg/dL
Nitrite: NEGATIVE
Protein, ur: NEGATIVE mg/dL
Specific Gravity, Urine: 1.004 — ABNORMAL LOW (ref 1.005–1.030)
pH: 7 (ref 5.0–8.0)

## 2024-03-03 LAB — URINE DRUG SCREEN
Amphetamines: NEGATIVE
Barbiturates: NEGATIVE
Benzodiazepines: NEGATIVE
Cocaine: NEGATIVE
Fentanyl: NEGATIVE
Methadone Scn, Ur: NEGATIVE
Opiates: NEGATIVE
Tetrahydrocannabinol: NEGATIVE

## 2024-03-03 LAB — CBG MONITORING, ED: Glucose-Capillary: 115 mg/dL — ABNORMAL HIGH (ref 70–99)

## 2024-03-03 MED ORDER — OLANZAPINE 10 MG IM SOLR: 5.0000 mg | Freq: Three times a day (TID) | INTRAMUSCULAR | Status: DC | PRN

## 2024-03-03 MED ORDER — OLANZAPINE 5 MG PO TBDP: 5.0000 mg | ORAL_TABLET | Freq: Three times a day (TID) | ORAL | Status: DC | PRN

## 2024-03-03 MED ORDER — FOSFOMYCIN TROMETHAMINE 3 G PO PACK
3.0000 g | PACK | Freq: Once | ORAL | Status: AC
  Administered 2024-03-03: 3 g via ORAL
  Filled 2024-03-03: qty 3

## 2024-03-03 MED ORDER — PROPRANOLOL HCL 10 MG PO TABS
10.0000 mg | ORAL_TABLET | Freq: Once | ORAL | Status: AC
  Administered 2024-03-03: 10 mg via ORAL
  Filled 2024-03-03: qty 1

## 2024-03-03 MED ORDER — RISPERIDONE 1 MG PO TABS
1.0000 mg | ORAL_TABLET | Freq: Every day | ORAL | Status: DC
  Administered 2024-03-03: 1 mg via ORAL
  Filled 2024-03-03: qty 1

## 2024-03-03 MED ORDER — NEBIVOLOL HCL 5 MG PO TABS
5.0000 mg | ORAL_TABLET | Freq: Every day | ORAL | Status: DC
  Administered 2024-03-03: 5 mg via ORAL
  Filled 2024-03-03 (×3): qty 1

## 2024-03-03 MED ORDER — PANTOPRAZOLE SODIUM 40 MG PO TBEC
40.0000 mg | DELAYED_RELEASE_TABLET | Freq: Every day | ORAL | Status: DC
  Administered 2024-03-03 – 2024-03-04 (×2): 40 mg via ORAL
  Filled 2024-03-03 (×2): qty 1

## 2024-03-03 MED ORDER — TRAZODONE HCL 50 MG PO TABS: 50.0000 mg | ORAL_TABLET | Freq: Every evening | ORAL | Status: DC | PRN

## 2024-03-03 MED ORDER — LEVOTHYROXINE SODIUM 25 MCG PO TABS
25.0000 ug | ORAL_TABLET | Freq: Every day | ORAL | Status: DC
  Administered 2024-03-03 – 2024-03-04 (×2): 25 ug via ORAL
  Filled 2024-03-03 (×2): qty 1

## 2024-03-03 MED ORDER — RISPERIDONE 2 MG PO TABS
2.0000 mg | ORAL_TABLET | Freq: Every day | ORAL | Status: DC
  Administered 2024-03-03 – 2024-03-04 (×2): 2 mg via ORAL
  Filled 2024-03-03 (×2): qty 1

## 2024-03-03 MED ORDER — HYDROCHLOROTHIAZIDE 12.5 MG PO TABS
12.5000 mg | ORAL_TABLET | Freq: Every day | ORAL | Status: DC
  Administered 2024-03-03 – 2024-03-04 (×2): 12.5 mg via ORAL
  Filled 2024-03-03 (×2): qty 1

## 2024-03-03 MED ORDER — LORAZEPAM 0.5 MG PO TABS
0.5000 mg | ORAL_TABLET | Freq: Two times a day (BID) | ORAL | Status: DC
  Administered 2024-03-03 – 2024-03-04 (×3): 0.5 mg via ORAL
  Filled 2024-03-03 (×3): qty 1

## 2024-03-03 NOTE — ED Notes (Signed)
 Pt. Continues to be anxious, tremulous and states I'm having a panic attack  Pt educated on coping techniques.  Encouraged deep breathing.

## 2024-03-03 NOTE — ED Notes (Signed)
 Pt. Has been up pacing intermittently.  Walking behind a wheel chair.  Exhibits med seeking behavior.  She's asked for something for anxiety several times.  Order obtained and pt given ativan  0.5 mg po as per scheduled order.

## 2024-03-03 NOTE — ED Notes (Signed)
 Report called to Darice FALCON RN @ [facility]. Pt signed consent to transfer form.

## 2024-03-03 NOTE — ED Provider Notes (Signed)
" °  Accepted handoff at shift change from Bundy PA-C. Please see prior provider note for more detail.   Briefly: Patient is 69 y.o. complaints of multiple falls, increased anxiety, and feeling unstable.  Someone advised her that she may have had a stroke and wanted her evaluated.  Patient reports these falls have been occurring for several weeks and she does not have any weakness. patient was advised to go to behavioral health urgent care by her therapist.  Patient has a history of anxiety, depression, PTSD, hypertension, diabetes.  Patient is currently taking risperidone , trazodone , Ambien , gabapentin , Cymbalta  and Klonopin .  Patient denies any SI, HI.  Patient is sent here for medical clearance for behavioral health urgent care.  Plan:  - Dispo pending UA. - UA concerning for infection with moderate leukocytes and rare bacteria.  CBC without leukocytosis or anemia.  CMP with reassuring kidney function test.  Salicylate/acetaminophen  levels within normal limits.  EtOH within normal limits. CT head reassuring.  - Patient ambulatory in ED with steady gait. - Doubt that the UTI today is causing patient's falls as she had reassuring UA's in the recent past when she presented with increased falls. - Patient with other recent ED visits for falls and continues to relate falls to her benzos that she takes at home. Unfortunately, patient is on benzos for her severe anxiety. Recent falls appear to be related to medicines and there are no concerning neuro deficits to suspect stroke at this time. Patient also with insomnia which may also be leading to increased falls. I believe following up with [facility] to talk about her medical management would help lead to resolution in her symptoms and falls. Patient agreeable to plan to be sent back to [facility] to talk about her medications for anxiety, depression, and insomnia.  - Patient will be given a dose of fosfomycin  for the UTI and will be sent back to [facility] for further medical  management for anxiety, depression, insomnia.  - Staffed with Dr. Midge.    Hoy Fraction F, [place] 03/03/24 0138    Pamella Ozell LABOR, DO 03/03/24 1515  "

## 2024-03-03 NOTE — ED Notes (Signed)
 Pt awake & resting at present, anxious at present.  Pt requesting her night time meds.  Comfort measures given.  Monitoring for safety.

## 2024-03-03 NOTE — Discharge Instructions (Signed)
 You had a mild urinary tract infection found on workup today.  You were given 1 dose of fosfomycin  which will clear up your UTI.  You will not be transferred to [facility] for mental health evaluation.  Please also follow-up with primary care.  Seek emergency care if experiencing any new or worsening symptoms.

## 2024-03-03 NOTE — ED Notes (Signed)
 NP Chinwendu Onuoha updated on vital signs.

## 2024-03-03 NOTE — ED Provider Notes (Signed)
 Behavioral Health Progress Note  Date and Time: 03/03/2024 11:55 AM Name: Becky [name] MRN:  [id number]  Subjective: Patient reassessed on the unit this morning. On approach, patient appears anxious and restlessness with notable tremors bilaterally in upper and lower extremities. Throughout interview, patient frequently paces, and alternates between sitting and standing. Patient states she feels like she's having a panic attack. She states she came into [facility] after seeing her outpatient psychiatric provider yesterday, who told her to come in and get a medical evaluation. Reports ongoing poor sleep. Slept 4 hours last night. She denies suicidal or homicidal thoughts. She feels she needs her medications changed because she is having falls at home. Discussed with patient that we will reach out to her outpatient provider to discuss her medication regimen and if changes can be made where appropriate.  Diagnosis:  Final diagnoses:  Anxious appearance  Encounter for medication management  Anxiety   Total Time spent with patient: 20 minutes  Past Psychiatric History: History of bipolar type II, PTSD, DID, mild cognitive impairment, chronic insomnia, adjustment disorder with mixed anxiety and depressed mood Current Home Meds: Zolpidem , Wellbutrin , Cymbalta , trazodone , Risperdal , and Ativan , Gabapentin .  Patient sees Rock Massa, NP outpatient for med managament. Past Medical History: hypertension, benign paroxysmal positional vertigo/orthostatic hypotension.  Family Psychiatric History: Unknown Social History: Patient lives alone.  Sleep: Poor  Appetite: Fair  Current Medications:  Current Facility-Administered Medications  Medication Dose Route Frequency Provider Last Rate Last Admin   0.9 %  sodium chloride  infusion  500 mL Intravenous Once Avram Lupita BRAVO, MD       LORazepam  (ATIVAN ) tablet 0.5 mg  0.5 mg Oral BID Desirai Traxler N, MD   0.5 mg at 03/03/24 1033   OLANZapine   (ZYPREXA ) injection 5 mg  5 mg Intramuscular TID PRN Onuoha, Chinwendu V, NP       OLANZapine  zydis (ZYPREXA ) disintegrating tablet 5 mg  5 mg Oral TID PRN Onuoha, Chinwendu V, NP       Current Outpatient Medications  Medication Sig Dispense Refill   buPROPion  (WELLBUTRIN  XL) 150 MG 24 hr tablet Take 450 mg by mouth every morning.     cholecalciferol  (VITAMIN D3) 25 MCG (1000 UNIT) tablet Take 1,000 Units by mouth daily.     docusate sodium (COLACE) 100 MG capsule Take 200 mg by mouth daily as needed for mild constipation.     DULoxetine  (CYMBALTA ) 60 MG capsule Take 120 mg by mouth daily.     EPIPEN  2-PAK 0.3 MG/0.3ML SOAJ injection Inject 0.3 mg into the muscle as needed for anaphylaxis.  1   gabapentin  (NEURONTIN ) 300 MG capsule Take 300 mg by mouth daily.     hydrochlorothiazide  (HYDRODIURIL ) 12.5 MG tablet Take 12.5 mg by mouth daily.     INGREZZA  60 MG capsule Take 60 mg by mouth at bedtime.     iron polysaccharides (NIFEREX) 150 MG capsule Take 150 mg by mouth daily.     levocetirizine (XYZAL ) 5 MG tablet Take 1 tablet (5 mg total) by mouth every evening. (Patient taking differently: Take 5 mg by mouth at bedtime.) 90 tablet 1   levothyroxine  (SYNTHROID ) 25 MCG tablet Take 25 mcg by mouth daily.     LORazepam  (ATIVAN ) 0.5 MG tablet Take 0.5 mg by mouth 2 (two) times daily.     nebivolol  (BYSTOLIC ) 5 MG tablet Take 5 mg by mouth daily.     Olopatadine  HCl (PATADAY ) 0.2 % SOLN Place 1 drop into both eyes daily as  needed. (Patient taking differently: Place 1 drop into both eyes daily as needed (For allergies).) 2.5 mL 5   omeprazole  (PRILOSEC) 40 MG capsule Take 40 mg by mouth at bedtime.     risperiDONE  (RISPERDAL ) 1 MG tablet Take 1-2 mg by mouth 2 (two) times daily. Take one tablet in the morning and two tablets at bedtime     traZODone  (DESYREL ) 100 MG tablet Take 200 mg by mouth at bedtime.     zolpidem  (AMBIEN ) 10 MG tablet Take 10 mg by mouth at bedtime.     clonazePAM  (KLONOPIN )  0.5 MG tablet Take 1 tablet (0.5 mg total) by mouth 3 (three) times daily as needed for anxiety. (Patient not taking: Reported on 03/03/2024) 30 tablet 0    Labs  Lab Results:  Admission on 03/02/2024, Discharged on 03/03/2024  Component Date Value Ref Range Status   Color, Urine 03/03/2024 STRAW (A)  YELLOW Final   APPearance 03/03/2024 CLEAR  CLEAR Final   Specific Gravity, Urine 03/03/2024 1.004 (L)  1.005 - 1.030 Final   pH 03/03/2024 7.0  5.0 - 8.0 Final   Glucose, UA 03/03/2024 NEGATIVE  NEGATIVE mg/dL Final   Hgb urine dipstick 03/03/2024 SMALL (A)  NEGATIVE Final   Bilirubin Urine 03/03/2024 NEGATIVE  NEGATIVE Final   Ketones, ur 03/03/2024 NEGATIVE  NEGATIVE mg/dL Final   Protein, ur 98/78/7973 NEGATIVE  NEGATIVE mg/dL Final   Nitrite 98/78/7973 NEGATIVE  NEGATIVE Final   Leukocytes,Ua 03/03/2024 MODERATE (A)  NEGATIVE Final   RBC / HPF 03/03/2024 0-5  0 - 5 RBC/hpf Final   WBC, UA 03/03/2024 6-10  0 - 5 WBC/hpf Final   Bacteria, UA 03/03/2024 RARE (A)  NONE SEEN Final   Squamous Epithelial / HPF 03/03/2024 0-5  0 - 5 /HPF Final   Mucus 03/03/2024 PRESENT   Final   Performed at [facility], [address] [place], [place] [address]   Opiates 03/03/2024 NEGATIVE  NEGATIVE Final   Cocaine 03/03/2024 NEGATIVE  NEGATIVE Final   Benzodiazepines 03/03/2024 NEGATIVE  NEGATIVE Final   Amphetamines 03/03/2024 NEGATIVE  NEGATIVE Final   Tetrahydrocannabinol 03/03/2024 NEGATIVE  NEGATIVE Final   Barbiturates 03/03/2024 NEGATIVE  NEGATIVE Final   Methadone Scn, Ur 03/03/2024 NEGATIVE  NEGATIVE Final   Fentanyl  03/03/2024 NEGATIVE  NEGATIVE Final   Comment: (NOTE) Drug screen is for Medical Purposes only. Positive results are preliminary only. If confirmation is needed, notify lab within 5 days.  Drug Class                 Cutoff (ng/mL) Amphetamine and metabolites 1000 Barbiturate and metabolites 200 Benzodiazepine              200 Opiates and metabolites     300 Cocaine  and metabolites     300 THC                         50 Fentanyl                     5 Methadone                   300  Trazodone  is metabolized in vivo to several metabolites,  including pharmacologically active m-CPP, which is excreted in the  urine.  Immunoassay screens for amphetamines and MDMA have potential  cross-reactivity with these compounds and may provide false positive  result.  Performed at [facility], [address], [place], [place] [address]    Alcohol, Ethyl (B) 03/02/2024 <15  <15 mg/dL Final   Comment: (NOTE) For medical purposes only. Performed at [facility], [address] [place], [place] [address]    Acetaminophen  (Tylenol ), Serum 03/02/2024 <10 (L)  10 - 30 ug/mL Final   Comment: (NOTE) Toxic concentrations can be more effectively related to post dose interval; >200, >100, and >50 ug/mL serum concentrations correspond to toxic concentrations at 4, 8, and 12 hours post dose, respectively.  Performed at [facility], [address], [place], [place] [address]    Salicylate Lvl 03/02/2024 <7.0 (L)  7.0 - 30.0 mg/dL Final   Performed at [facility], [address] [place], [place] [address]   WBC 03/02/2024 6.4  4.0 - 10.5 K/uL Final   RBC 03/02/2024 4.19  3.87 - 5.11 MIL/uL Final   Hemoglobin 03/02/2024 13.1  12.0 - 15.0 g/dL Final   HCT 98/79/7973 39.1  36.0 - 46.0 % Final   MCV 03/02/2024 93.3  80.0 - 100.0 fL Final   MCH 03/02/2024 31.3  26.0 - 34.0 pg Final   MCHC 03/02/2024 33.5  30.0 - 36.0 g/dL Final   RDW 98/79/7973 13.1  11.5 - 15.5 % Final   Platelets 03/02/2024 262  150 - 400 K/uL Final   nRBC 03/02/2024 0.0  0.0 - 0.2 % Final   Neutrophils Relative % 03/02/2024 60  % Final   Neutro Abs 03/02/2024 3.9  1.7 - 7.7 K/uL Final   Lymphocytes Relative 03/02/2024 18  % Final   Lymphs Abs 03/02/2024 1.2  0.7 - 4.0 K/uL Final   Monocytes Relative 03/02/2024 19  % Final   Monocytes Absolute 03/02/2024 1.2 (H)  0.1 -  1.0 K/uL Final   Eosinophils Relative 03/02/2024 1  % Final   Eosinophils Absolute 03/02/2024 0.1  0.0 - 0.5 K/uL Final   Basophils Relative 03/02/2024 1  % Final   Basophils Absolute 03/02/2024 0.0  0.0 - 0.1 K/uL Final   Immature Granulocytes 03/02/2024 1  % Final   Abs Immature Granulocytes 03/02/2024 0.03  0.00 - 0.07 K/uL Final   Performed at [facility], [address], [place], [place] [address]   Sodium 03/02/2024 133 (L)  135 - 145 mmol/L Final   Potassium 03/02/2024 3.8  3.5 - 5.1 mmol/L Final   HEMOLYSIS AT THIS LEVEL MAY AFFECT RESULT   Chloride 03/02/2024 94 (L)  98 - 111 mmol/L Final   CO2 03/02/2024 30  22 - 32 mmol/L Final   Glucose, Bld 03/02/2024 91  70 - 99 mg/dL Final   Glucose reference range applies only to samples taken after fasting for at least 8 hours.   BUN 03/02/2024 14  8 - 23 mg/dL Final   Creatinine, Ser 03/02/2024 0.85  0.44 - 1.00 mg/dL Final   Calcium 98/79/7973 9.5  8.9 - 10.3 mg/dL Final   Total Protein 98/79/7973 7.1  6.5 - 8.1 g/dL Final   Albumin 98/79/7973 4.3  3.5 - 5.0 g/dL Final   AST 98/79/7973 36  15 - 41 U/L Final   HEMOLYSIS AT THIS LEVEL MAY AFFECT RESULT   ALT 03/02/2024 17  0 - 44 U/L Final   Alkaline Phosphatase 03/02/2024 81  38 - 126 U/L Final   Total Bilirubin 03/02/2024 0.3  0.0 - 1.2 mg/dL Final   GFR, Estimated 03/02/2024 >60  >60 mL/min Final   Comment: (NOTE) Calculated using the CKD-EPI  Creatinine Equation (2021)    Anion gap 03/02/2024 10  5 - 15 Final   Performed at [facility], [address], [place], [place] [address]   Glucose-Capillary 03/03/2024 115 (H)  70 - 99 mg/dL Final   Glucose reference range applies only to samples taken after fasting for at least 8 hours.  Admission on 03/02/2024, Discharged on 03/02/2024  Component Date Value Ref Range Status   Glucose-Capillary 03/02/2024 310 (H)  70 - 99 mg/dL Final   Glucose reference range applies only to samples taken after fasting for at least 8  hours.   Comment 1 03/02/2024 Notify RN   Final  Admission on 02/28/2024, Discharged on 02/28/2024  Component Date Value Ref Range Status   Glucose-Capillary 02/28/2024 144 (H)  70 - 99 mg/dL Final   Glucose reference range applies only to samples taken after fasting for at least 8 hours.   Sodium 02/28/2024 131 (L)  135 - 145 mmol/L Final   Potassium 02/28/2024 3.5  3.5 - 5.1 mmol/L Final   Chloride 02/28/2024 92 (L)  98 - 111 mmol/L Final   CO2 02/28/2024 26  22 - 32 mmol/L Final   Glucose, Bld 02/28/2024 147 (H)  70 - 99 mg/dL Final   Glucose reference range applies only to samples taken after fasting for at least 8 hours.   BUN 02/28/2024 14  8 - 23 mg/dL Final   Creatinine, Ser 02/28/2024 0.83  0.44 - 1.00 mg/dL Final   Calcium 98/82/7973 9.8  8.9 - 10.3 mg/dL Final   Total Protein 98/82/7973 7.4  6.5 - 8.1 g/dL Final   Albumin 98/82/7973 4.6  3.5 - 5.0 g/dL Final   AST 98/82/7973 44 (H)  15 - 41 U/L Final   ALT 02/28/2024 21  0 - 44 U/L Final   Alkaline Phosphatase 02/28/2024 79  38 - 126 U/L Final   Total Bilirubin 02/28/2024 0.3  0.0 - 1.2 mg/dL Final   GFR, Estimated 02/28/2024 >60  >60 mL/min Final   Comment: (NOTE) Calculated using the CKD-EPI Creatinine Equation (2021)    Anion gap 02/28/2024 13  5 - 15 Final   Performed at [facility], [address] [place], [place] [address]   Alcohol, Ethyl (B) 02/28/2024 <15  <15 mg/dL Final   Comment: (NOTE) For medical purposes only. Performed at [facility], [address] [place], [place] [address]    Opiates 02/28/2024 NEGATIVE  NEGATIVE Final   Cocaine 02/28/2024 NEGATIVE  NEGATIVE Final   Benzodiazepines 02/28/2024 POSITIVE (A)  NEGATIVE Final   Amphetamines 02/28/2024 NEGATIVE  NEGATIVE Final   Tetrahydrocannabinol 02/28/2024 NEGATIVE  NEGATIVE Final   Barbiturates 02/28/2024 NEGATIVE  NEGATIVE Final   Methadone Scn, Ur 02/28/2024 NEGATIVE  NEGATIVE Final   Fentanyl  02/28/2024  NEGATIVE  NEGATIVE Final   Comment: (NOTE) Drug screen is for Medical Purposes only. Positive results are preliminary only. If confirmation is needed, notify lab within 5 days.  Drug Class                 Cutoff (ng/mL) Amphetamine and metabolites 1000 Barbiturate and metabolites 200 Benzodiazepine              200 Opiates and metabolites     300 Cocaine and metabolites     300 THC                         50 Fentanyl   5 Methadone                   300  Trazodone  is metabolized in vivo to several metabolites,  including pharmacologically active m-CPP, which is excreted in the  urine.  Immunoassay screens for amphetamines and MDMA have potential  cross-reactivity with these compounds and may provide false positive  result.  Performed at [facility], [address], [place], [place] [address]    WBC 02/28/2024 6.8  4.0 - 10.5 K/uL Final   RBC 02/28/2024 4.16  3.87 - 5.11 MIL/uL Final   Hemoglobin 02/28/2024 13.1  12.0 - 15.0 g/dL Final   HCT 98/82/7973 38.0  36.0 - 46.0 % Final   MCV 02/28/2024 91.3  80.0 - 100.0 fL Final   MCH 02/28/2024 31.5  26.0 - 34.0 pg Final   MCHC 02/28/2024 34.5  30.0 - 36.0 g/dL Final   RDW 98/82/7973 12.8  11.5 - 15.5 % Final   Platelets 02/28/2024 261  150 - 400 K/uL Final   nRBC 02/28/2024 0.0  0.0 - 0.2 % Final   Neutrophils Relative % 02/28/2024 75  % Final   Neutro Abs 02/28/2024 5.0  1.7 - 7.7 K/uL Final   Lymphocytes Relative 02/28/2024 12  % Final   Lymphs Abs 02/28/2024 0.8  0.7 - 4.0 K/uL Final   Monocytes Relative 02/28/2024 13  % Final   Monocytes Absolute 02/28/2024 0.9  0.1 - 1.0 K/uL Final   Eosinophils Relative 02/28/2024 0  % Final   Eosinophils Absolute 02/28/2024 0.0  0.0 - 0.5 K/uL Final   Basophils Relative 02/28/2024 0  % Final   Basophils Absolute 02/28/2024 0.0  0.0 - 0.1 K/uL Final   Immature Granulocytes 02/28/2024 0  % Final   Abs Immature Granulocytes 02/28/2024 0.02  0.00 - 0.07  K/uL Final   Performed at [facility], [address] [place], [place] [address]   Salicylate Lvl 02/28/2024 <7.0 (L)  7.0 - 30.0 mg/dL Final   Performed at [facility], [address] [place], [place] [address]   Acetaminophen  (Tylenol ), Serum 02/28/2024 <10 (L)  10 - 30 ug/mL Final   Comment: (NOTE) Toxic concentrations can be more effectively related to post dose interval; >200, >100, and >50 ug/mL serum concentrations correspond to toxic concentrations at 4, 8, and 12 hours post dose, respectively.  Performed at [facility], [address], [place], [place] [address]    Color, Urine 02/28/2024 YELLOW  YELLOW Final   APPearance 02/28/2024 CLEAR  CLEAR Final   Specific Gravity, Urine 02/28/2024 1.010  1.005 - 1.030 Final   pH 02/28/2024 7.0  5.0 - 8.0 Final   Glucose, UA 02/28/2024 NEGATIVE  NEGATIVE mg/dL Final   Hgb urine dipstick 02/28/2024 SMALL (A)  NEGATIVE Final   Bilirubin Urine 02/28/2024 NEGATIVE  NEGATIVE Final   Ketones, ur 02/28/2024 NEGATIVE  NEGATIVE mg/dL Final   Protein, ur 98/82/7973 NEGATIVE  NEGATIVE mg/dL Final   Nitrite 98/82/7973 NEGATIVE  NEGATIVE Final   Leukocytes,Ua 02/28/2024 NEGATIVE  NEGATIVE Final   RBC / HPF 02/28/2024 0-5  0 - 5 RBC/hpf Final   WBC, UA 02/28/2024 0-5  0 - 5 WBC/hpf Final   Bacteria, UA 02/28/2024 RARE (A)  NONE SEEN Final   Squamous Epithelial / HPF 02/28/2024 0-5  0 - 5 /HPF Final   Performed at [facility], [address], [place], [place] [address]  Admission on 02/23/2024, Discharged on  02/23/2024  Component Date Value Ref Range Status   SARS Coronavirus 2 by RT PCR 02/23/2024 NEGATIVE  NEGATIVE Final   Comment: (NOTE) SARS-CoV-2 target nucleic acids are NOT DETECTED.  The SARS-CoV-2 RNA is generally detectable in upper respiratory specimens during the acute phase of infection. The lowest concentration of SARS-CoV-2 viral copies this assay can  detect is 138 copies/mL. A negative result does not preclude SARS-Cov-2 infection and should not be used as the sole basis for treatment or other patient management decisions. A negative result may occur with  improper specimen collection/handling, submission of specimen other than nasopharyngeal swab, presence of viral mutation(s) within the areas targeted by this assay, and inadequate number of viral copies(<138 copies/mL). A negative result must be combined with clinical observations, patient history, and epidemiological information. The expected result is Negative.  Fact Sheet for Patients:  bloggercourse.com  Fact Sheet for Healthcare Providers:  seriousbroker.it  This test is no                          t yet approved or cleared by the [place]  FDA and  has been authorized for detection and/or diagnosis of SARS-CoV-2 by FDA under an Emergency Use Authorization (EUA). This EUA will remain  in effect (meaning this test can be used) for the duration of the COVID-19 declaration under Section 564(b)(1) of the Act, 21 U.S.C.section 360bbb-3(b)(1), unless the authorization is terminated  or revoked sooner.       Influenza A by PCR 02/23/2024 NEGATIVE  NEGATIVE Final   Influenza B by PCR 02/23/2024 NEGATIVE  NEGATIVE Final   Comment: (NOTE) The Xpert Xpress SARS-CoV-2/FLU/RSV plus assay is intended as an aid in the diagnosis of influenza from Nasopharyngeal swab specimens and should not be used as a sole basis for treatment. Nasal washings and aspirates are unacceptable for Xpert Xpress SARS-CoV-2/FLU/RSV testing.  Fact Sheet for Patients: bloggercourse.com  Fact Sheet for Healthcare Providers: seriousbroker.it  This test is not yet approved or cleared by the [place]  FDA and has been authorized for detection and/or diagnosis of SARS-CoV-2 by FDA under an Emergency  Use Authorization (EUA). This EUA will remain in effect (meaning this test can be used) for the duration of the COVID-19 declaration under Section 564(b)(1) of the Act, 21 U.S.C. section 360bbb-3(b)(1), unless the authorization is terminated or revoked.     Resp Syncytial Virus by PCR 02/23/2024 NEGATIVE  NEGATIVE Final   Comment: (NOTE) Fact Sheet for Patients: bloggercourse.com  Fact Sheet for Healthcare Providers: seriousbroker.it  This test is not yet approved or cleared by the [place]  FDA and has been authorized for detection and/or diagnosis of SARS-CoV-2 by FDA under an Emergency Use Authorization (EUA). This EUA will remain in effect (meaning this test can be used) for the duration of the COVID-19 declaration under Section 564(b)(1) of the Act, 21 U.S.C. section 360bbb-3(b)(1), unless the authorization is terminated or revoked.  Performed at [facility], [address], [place], [place] [address]    Color, Urine 02/23/2024 YELLOW  YELLOW Final   APPearance 02/23/2024 CLEAR  CLEAR Final   Specific Gravity, Urine 02/23/2024 1.018  1.005 - 1.030 Final   pH 02/23/2024 7.0  5.0 - 8.0 Final   Glucose, UA 02/23/2024 NEGATIVE  NEGATIVE mg/dL Final   Hgb urine dipstick 02/23/2024 TRACE (A)  NEGATIVE Final   Bilirubin Urine 02/23/2024 NEGATIVE  NEGATIVE Final   Ketones, ur 02/23/2024 NEGATIVE  NEGATIVE mg/dL Final  Protein, ur 02/23/2024 NEGATIVE  NEGATIVE mg/dL Final   Nitrite 98/87/7973 NEGATIVE  NEGATIVE Final   Leukocytes,Ua 02/23/2024 TRACE (A)  NEGATIVE Final   RBC / HPF 02/23/2024 0-5  0 - 5 RBC/hpf Final   WBC, UA 02/23/2024 0-5  0 - 5 WBC/hpf Final   Bacteria, UA 02/23/2024 RARE (A)  NONE SEEN Final   Squamous Epithelial / HPF 02/23/2024 0-5  0 - 5 /HPF Final   Performed at [facility], [address] [place], [place] [address]  Admission on 02/22/2024, Discharged on  02/22/2024  Component Date Value Ref Range Status   Sodium 02/22/2024 135  135 - 145 mmol/L Final   Potassium 02/22/2024 3.8  3.5 - 5.1 mmol/L Final   Chloride 02/22/2024 98  98 - 111 mmol/L Final   CO2 02/22/2024 26  22 - 32 mmol/L Final   Glucose, Bld 02/22/2024 122 (H)  70 - 99 mg/dL Final   Glucose reference range applies only to samples taken after fasting for at least 8 hours.   BUN 02/22/2024 19  8 - 23 mg/dL Final   Creatinine, Ser 02/22/2024 0.88  0.44 - 1.00 mg/dL Final   Calcium 98/88/7973 9.7  8.9 - 10.3 mg/dL Final   Total Protein 98/88/7973 6.9  6.5 - 8.1 g/dL Final   Albumin 98/88/7973 4.2  3.5 - 5.0 g/dL Final   AST 98/88/7973 25  15 - 41 U/L Final   HEMOLYSIS AT THIS LEVEL MAY AFFECT RESULT   ALT 02/22/2024 9  0 - 44 U/L Final   Alkaline Phosphatase 02/22/2024 72  38 - 126 U/L Final   Total Bilirubin 02/22/2024 0.2  0.0 - 1.2 mg/dL Final   GFR, Estimated 02/22/2024 >60  >60 mL/min Final   Comment: (NOTE) Calculated using the CKD-EPI Creatinine Equation (2021)    Anion gap 02/22/2024 11  5 - 15 Final   Performed at [facility], [address], [place], [place] [address]   WBC 02/22/2024 5.4  4.0 - 10.5 K/uL Final   RBC 02/22/2024 4.13  3.87 - 5.11 MIL/uL Final   Hemoglobin 02/22/2024 12.6  12.0 - 15.0 g/dL Final   HCT 98/88/7973 39.3  36.0 - 46.0 % Final   MCV 02/22/2024 95.2  80.0 - 100.0 fL Final   MCH 02/22/2024 30.5  26.0 - 34.0 pg Final   MCHC 02/22/2024 32.1  30.0 - 36.0 g/dL Final   RDW 98/88/7973 13.2  11.5 - 15.5 % Final   Platelets 02/22/2024 227  150 - 400 K/uL Final   nRBC 02/22/2024 0.0  0.0 - 0.2 % Final   Neutrophils Relative % 02/22/2024 64  % Final   Neutro Abs 02/22/2024 3.4  1.7 - 7.7 K/uL Final   Lymphocytes Relative 02/22/2024 21  % Final   Lymphs Abs 02/22/2024 1.1  0.7 - 4.0 K/uL Final   Monocytes Relative 02/22/2024 13  % Final   Monocytes Absolute 02/22/2024 0.7  0.1 - 1.0 K/uL Final   Eosinophils Relative 02/22/2024  2  % Final   Eosinophils Absolute 02/22/2024 0.1  0.0 - 0.5 K/uL Final   Basophils Relative 02/22/2024 0  % Final   Basophils Absolute 02/22/2024 0.0  0.0 - 0.1 K/uL Final   Immature Granulocytes 02/22/2024 0  % Final   Abs Immature Granulocytes 02/22/2024 0.02  0.00 - 0.07 K/uL Final   Performed at [facility], [address] [place], [place] [address]   Troponin T High Sensitivity 02/22/2024 <15  0 - 19 ng/L Final   Comment: (NOTE) Biotin concentrations > 1000 ng/mL falsely decrease TnT results.  Serial cardiac troponin measurements are suggested.  Refer to the Links section for chest pain algorithms and additional  guidance. Performed at [facility], [address], [place], [place] [address]   Procedure visit on 12/10/2023  Component Date Value Ref Range Status   SURGICAL PATHOLOGY 12/10/2023    Final-Edited                   Value:SURGICAL PATHOLOGY [facility] [address] [place], [place] [address] Telephone [phone] or [phone] Fax [phone]  REPORT OF SURGICAL PATHOLOGY   Accession #: [id number] Patient Name: ABEGAIL, KLOEPPEL Visit # : [id number]  MRN: [id number] Physician: Avram Pitts DOB/Age 11/12/1955 (Age: 35) Gender: F Collected Date: 12/10/2023 Received Date: 12/12/2023  FINAL DIAGNOSIS       1. Surgical [P], duodenal :       - UNREMARKABLE DUODENAL MUCOSA.      - NEGATIVE FOR FEATURES OF CELIAC, DYSPLASIA, AND MALIGNANCY.       2. Surgical [P], gastric antrum polyp :       - POLYPOID FRAGMENT OF ANTRAL MUCOSA WITH REACTIVE FOVEOLAR HYPERPLASIA.      - NEGATIVE FOR H. PYLORI, INTESTINAL METAPLASIA, DYSPLASIA, AND MALIGNANCY.       3. 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      - NEGATIVE FOR MALIGNANCY.       4. Surgical [P], colon, ascending, polyp (2) :                                - TUBULAR  ADENOMA (MULTIPLE FRAGMENTS); NEGATIVE FOR HIGH-GRADE DYSPLASIA AND      MALIGNANCY.      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       ELECTRONIC SIGNATURE : Rubinas Md, Rexene , Sports Administrator, Electronic Signature  MICROSCOPIC DESCRIPTION  CASE COMMENTS STAINS USED IN DIAGNOSIS: H&E H&E H&E H&E-2 H&E H&E-2    CLINICAL HISTORY  SPECIMEN(S) OBTAINED 1. Surgical [P], Duodenal 2. Surgical [P], Gastric Antrum Polyp 3. Surgical [P], Colon, Cecum, Polyp (1) 4. Surgical [P], Colon, Ascending, Polyp (2)  SPECIMEN COMMENTS: 1. Iron deficiency anemia, unspecified iron deficiency anemia type; history of helicobacter pylori infection; hx of colonic polyps; benign neoplasm of cecum; benign neoplasm of ascending colon SPECIMEN CLINICAL INFORMATION: 1. R/O celiac sprue 2. R/O other neoplasia 3. R/O adenoma 4. R/O adenoma    Gross Description 1. Received in formalin are tan, sof                         t tissue fragments that are submitted in toto.Number: multiple, Size: 0.2 cm smallest to 0.4 cm largest, (1B) ( TA ) 2. Received in formalin are tan, soft tissue fragments that are submitted in toto.Number: 2 Size: 0.3 cm, (1B) ( TA ) 3. Received in formalin are tan, soft tissue fragments that are submitted in toto.Number: 2, Size: 0.9 cm smallest to 1.0 cm largest = 3 sec, (1B) ( TA ) 4. Received in formalin are tan, soft tissue fragments that are submitted in toto.Number: 2, Size: 0.3 cm smallest to 1.5 cm largest = 3 sec, (1B) (  TA )        Report signed out from the following location(s) . [facility] [address] [place], [place] [address] CLIA #: [id number]  [facility] [address] [place], [place] [address] CLIA #: [id number]   Lab on 09/24/2023  Component Date Value Ref Range Status   Iron 09/24/2023 60  42 - 145 ug/dL Final   Transferrin 91/86/7974 314.0   212.0 - 360.0 mg/dL Final   Saturation Ratios 09/24/2023 13.6 (L)  20.0 - 50.0 % Final   Ferritin 09/24/2023 4.7 (L)  10.0 - 291.0 ng/mL Final   TIBC 09/24/2023 439.6  250.0 - 450.0 mcg/dL Final   Sodium 91/86/7974 137  135 - 145 mEq/L Final   Potassium 09/24/2023 3.7  3.5 - 5.1 mEq/L Final   Chloride 09/24/2023 99  96 - 112 mEq/L Final   CO2 09/24/2023 31  19 - 32 mEq/L Final   Glucose, Bld 09/24/2023 134 (H)  70 - 99 mg/dL Final   BUN 91/86/7974 15  6 - 23 mg/dL Final   Creatinine, Ser 09/24/2023 1.10  0.40 - 1.20 mg/dL Final   Total Bilirubin 09/24/2023 0.3  0.2 - 1.2 mg/dL Final   Alkaline Phosphatase 09/24/2023 55  39 - 117 U/L Final   AST 09/24/2023 14  0 - 37 U/L Final   ALT 09/24/2023 10  0 - 35 U/L Final   Total Protein 09/24/2023 6.5  6.0 - 8.3 g/dL Final   Albumin 91/86/7974 4.0  3.5 - 5.2 g/dL Final   GFR 91/86/7974 52.39 (L)  >60.00 mL/min Final   Calculated using the CKD-EPI Creatinine Equation (2021)   Calcium 09/24/2023 9.2  8.4 - 10.5 mg/dL Final   WBC 91/86/7974 3.9 (L)  4.0 - 10.5 K/uL Final   RBC 09/24/2023 4.43  3.87 - 5.11 Mil/uL Final   Hemoglobin 09/24/2023 13.1  12.0 - 15.0 g/dL Final   HCT 91/86/7974 39.7  36.0 - 46.0 % Final   MCV 09/24/2023 89.5  78.0 - 100.0 fl Final   MCHC 09/24/2023 33.1  30.0 - 36.0 g/dL Final   RDW 91/86/7974 14.5  11.5 - 15.5 % Final   Platelets 09/24/2023 191.0  150.0 - 400.0 K/uL Final   Neutrophils Relative % 09/24/2023 57.9  43.0 - 77.0 % Final   Lymphocytes Relative 09/24/2023 26.2  12.0 - 46.0 % Final   Monocytes Relative 09/24/2023 11.7  3.0 - 12.0 % Final   Eosinophils Relative 09/24/2023 3.2  0.0 - 5.0 % Final   Basophils Relative 09/24/2023 1.0  0.0 - 3.0 % Final   Neutro Abs 09/24/2023 2.3  1.4 - 7.7 K/uL Final   Lymphs Abs 09/24/2023 1.0  0.7 - 4.0 K/uL Final   Monocytes Absolute 09/24/2023 0.5  0.1 - 1.0 K/uL Final   Eosinophils Absolute 09/24/2023 0.1  0.0 - 0.7 K/uL Final   Basophils Absolute 09/24/2023 0.0  0.0 -  0.1 K/uL Final    Blood Alcohol level:  Lab Results  Component Value Date   [facility] <15 03/02/2024   ETH <15 02/28/2024    Metabolic Disorder Labs: Lab Results  Component Value Date   HGBA1C 6.0 (H) 10/04/2020   MPG 125.5 10/04/2020   No results found for: PROLACTIN Lab Results  Component Value Date   CHOL 220 09/13/2009   TRIG 159 09/13/2009   HDL 70 09/13/2009   VLDL 32 09/13/2009   LDLCALC 118 09/13/2009    Therapeutic Lab Levels: No results found  for: LITHIUM No results found for: VALPROATE No results found for: CBMZ  Physical Findings   Mini-Mental    Flowsheet Row Office Visit from 10/23/2020 in [facility] Office Visit from 11/19/2018 in [facility] Office Visit from 03/13/2017 in [facility]  Total Score (max 30 points ) 26 23 29    PHQ2-9    Flowsheet Row ED from 03/02/2024 in [facility] Nutrition from 07/22/2023 in [facility]  - A Dept Of [place]. [facility] Office Visit from 11/18/2014 in [facility] - A Dept Of [facility]. [facility] Office Visit from 02/18/2014 in [facility] - A Dept Of [facility]. [facility]  PHQ-2 Total Score 4 2 0 0  PHQ-9 Total Score 12 16 -- --   Flowsheet Row ED from 03/03/2024 in [facility] Most recent reading at 03/03/2024  6:42 AM ED from 03/02/2024 in [facility] Emergency Department at [facility] Most recent reading at 03/02/2024  8:33 PM ED from 03/02/2024 in [facility] Most recent reading at 03/02/2024  6:19 PM  C-SSRS RISK CATEGORY No Risk No Risk Moderate Risk     Musculoskeletal  Strength & Muscle Tone: within normal limits Gait & Station: normal Patient leans: N/A  Psychiatric Specialty Exam  Presentation  General Appearance:  Casual  Eye  Contact: Good  Speech: Clear and Coherent; Slow  Speech Volume: Normal  Handedness: Right   Mood and Affect  Mood: Anxious  Affect: Congruent   Thought Process  Thought Processes: Coherent; Goal Directed  Descriptions of Associations:Intact  Orientation:Full (Time, Place and Person)  Thought Content:WDL  Diagnosis of Schizophrenia or Schizoaffective disorder in past: No data recorded Duration of Psychotic Symptoms: No data recorded  Hallucinations:Hallucinations: None  Ideas of Reference:None  Suicidal Thoughts:Suicidal Thoughts: No  Homicidal Thoughts:Homicidal Thoughts: No   Sensorium  Memory: Immediate Good  Judgment: Fair  Insight: Present   Executive Functions  Concentration: Fair  Attention Span: Fair  Recall: Fair  Fund of Knowledge: Fair  Language: Fair   Psychomotor Activity  Psychomotor Activity: Psychomotor Activity: Tremor; Restlessness   Assets  Assets: Manufacturing Systems Engineer; Desire for Improvement   Sleep  Sleep: Sleep: Poor  No Safety Checks orders active in given range  Nutritional Assessment (For OBS and FBC admissions only) Has the patient had a weight loss or gain of 10 pounds or more in the last 3 months?: No Has the patient had a decrease in food intake/or appetite?: No Does the patient have dental problems?: No Does the patient have eating habits or behaviors that may be indicators of an eating disorder including binging or inducing vomiting?: No Has the patient recently lost weight without trying?: 0 Has the patient been eating poorly because of a decreased appetite?: 0 Malnutrition Screening Tool Score: 0   Physical Exam  Physical Exam Vitals and nursing note reviewed.  Constitutional:      General: She is not in acute distress.    Appearance: Normal appearance. She is not ill-appearing.  HENT:     Head: Normocephalic and atraumatic.  Eyes:     Conjunctiva/sclera: Conjunctivae normal.   Pulmonary:     Effort: Pulmonary effort is normal. No respiratory distress.  Skin:    General: Skin is warm and dry.  Neurological:     General: No focal deficit present.  Mental Status: She is alert and oriented to person, place, and time. Mental status is at baseline.    Review of Systems  Gastrointestinal:  Negative for abdominal pain, constipation, diarrhea, nausea and vomiting.  Neurological:  Positive for tremors. Negative for dizziness, focal weakness and headaches.  Psychiatric/Behavioral:  The patient is nervous/anxious.   All other systems reviewed and are negative.  Blood pressure (!) 149/88, pulse 66, temperature 97.7 F (36.5 C), resp. rate 17, SpO2 100%. There is no height or weight on file to calculate BMI.  Treatment Plan Summary: Patient is a 69 year old female with a psychiatric history of bipolar type II, PTSD, DID, mild cognitive impairment, chronic insomnia, adjustment disorder with mixed anxiety and depressed mood who presents voluntarily to [facility]. Patient was seen by her outpatient provider for a medical evaluation and possible adjustment of medication regimen as she's been having recent falls. Suspect polypharmacy and use of multiple sedating medications is a major contributor to recent falls; recommend reducing sedating medications as much as possible to avoid this. Possible differentials for patient's tremors and restlessness include: EPS, anxiety/panic attacks, and benzo withdrawal. From a safety standpoint, patient denies SI/HI and does not appear to be an acute safety risk to herself or others, and does not require inpatient care.  -CIWA protocol to monitor for benzodiazepine withdrawal -Restarted home Ativan  0.5 mg BID to avoid benzodiazepine withdrawal as patient is chronically on benzos -Reached out to outpatient provider, Rock Massa, NP to discuss medication regimen; awaiting return phone call. -Patient was diagnosed with uncomplicated UTI  yesterday in the ED, and received one-time dose of fosfomycin . Continue to monitor for urinary symptoms.  Ashley LOISE Gravely, MD 03/03/2024 11:55 AM

## 2024-03-03 NOTE — ED Provider Notes (Signed)
 [facility] Continuous Assessment Admission H&P  Date: 03/03/24 Patient Name: Becky [name] MRN: [id number] Chief Complaint:  I'm concerned about my medications.   Diagnoses:  Final diagnoses:  Anxious appearance  Encounter for medication management  Anxiety    HPI: Becky [name] is a 69 year old female with psychiatric history of PTSD, DID, mild cognitive impairment, chronic insomnia, adjustment disorder with mixed anxiety and depressed mood, anxiety, depression, and medical history of hypertension, benign paroxysmal positional vertigo/orthostatic hypotension.  Patient was seen face to face by this provider and chart reviewed.   Per chart review, Patient initially presented to [facility] yesterday accompanied by her friend due to concerns about her recent falls, suspected to be a reaction to some of her psychiatric medications, especially her Risperdal .   At the time of her initial psychiatric assessment, patient was noted to be shaking, restless and ambulating with an unsteady gait with complaints of not feeling right and concerns she might have suffered a stroke. Patient was subsequently transferred to [facility] for medical clearance.   At [facility], labs imaging/CT head were reassuring.  UA concerning for moderate infection and patient was given a dose of fosfomycin  ( 3 g PO x1) for UTI. Per EDP: Patient with other recent ED visits for falls and continues to relate falls to her benzos that she takes at home. Unfortunately, patient is on benzos for her severe anxiety. Recent falls appear to be related to medicines and there are no concerning neuro deficits to suspect stroke at this time. Patient also with insomnia which may also be leading to increased falls.    PDMP reviewed and patient has active orders for two Benzos- -Ativan  0.5 mg, 60 tablets for 30 days refilled January 2026;  -Clonazepam  0.5 mg, 90 tablets for 30 days refilled January 2026, and  In addition to Ambien  10  mg, 30 tablets for 30 days refilled January 2026.  She is also prescribed gabapentin  gabapentin  300 mg, 90 tablets for 30 days, which was also refilled January 2026.SABRA  Patient was medically cleared and returned to the [facility] to continue her psychiatric care.  Patient reports she is currently prescribed zolpidem , Wellbutrin , Cymbalta , trazodone , Risperdal , and Klonopin .  She is unable to remember her dosages but reports being medication compliant.  Patient noted to have generalized tremors and restlessness which she reports began two weeks ago and occur all the time.  Patient is also endorsing poor sleep and anxiety as her current stressor.   Patient was given ativan  1 mg for sleep and anxiety at [facility].   Patient reports her outpatient psychiatric provider suspects her Risperdal  might be responsible for her tremors and recommended she come to the [facility] for medication management.  Patient reports she has taken the risperidone  for a long time.  Patient reports she lives alone and not coping well.  On evaluation, patient is alert, oriented x 3, and cooperative. Speech is clear and coherent. Pt appears casually dressed. Eye contact is good. Mood is anxious, affect is congruent with mood. Thought process is coherent and goal directed and thought content is WDL. Pt denies SI/HI/AVH. There is no objective indication that the patient is responding to internal stimuli. No delusions elicited during this assessment.    Discussed recommendations for admission to the continuous observation unit for safety monitoring and treatment and reeval in the a.m. Patient is unable to confirm the current dosage of her home medications.  Recommend collaboration with outpatient psychiatric provider in the am for medication management. Discussed  use of propranolol  10 mg p.o. once for HTN, anxiety, and insomnia, and reevaluate tolerance in the a.m.   Patient education provided on risks, benefits, and alternatives to  treatment. Patient is provided with opportunity for questions.  She verbalized understanding and is in agreement.  Total Time spent with patient: 30 minutes  Musculoskeletal  Strength & Muscle Tone: within normal limits and decreased Gait & Station: unsteady Patient leans: in wheelchair  Psychiatric Specialty Exam  Presentation General Appearance:  Casual  Eye Contact: Good  Speech: Clear and Coherent; Slow  Speech Volume: Normal  Handedness: Right   Mood and Affect  Mood: Anxious  Affect: Congruent   Thought Process  Thought Processes: Coherent; Goal Directed  Descriptions of Associations:Intact  Orientation:Full (Time, Place and Person)  Thought Content:WDL  Diagnosis of Schizophrenia or Schizoaffective disorder in past: No data recorded Duration of Psychotic Symptoms: No data recorded Hallucinations:Hallucinations: None  Ideas of Reference:None  Suicidal Thoughts:Suicidal Thoughts: No  Homicidal Thoughts:Homicidal Thoughts: No   Sensorium  Memory: Immediate Good  Judgment: Fair  Insight: Present   Executive Functions  Concentration: Fair  Attention Span: Fair  Recall: Fair  Fund of Knowledge: Fair  Language: Fair   Psychomotor Activity  Psychomotor Activity: Psychomotor Activity: Tremor; Restlessness   Assets  Assets: Manufacturing Systems Engineer; Desire for Improvement   Sleep  Sleep: Sleep: Poor   Nutritional Assessment (For OBS and FBC admissions only) Has the patient had a weight loss or gain of 10 pounds or more in the last 3 months?: No Has the patient had a decrease in food intake/or appetite?: No Does the patient have dental problems?: No Does the patient have eating habits or behaviors that may be indicators of an eating disorder including binging or inducing vomiting?: No Has the patient recently lost weight without trying?: 0 Has the patient been eating poorly because of a decreased appetite?:  0 Malnutrition Screening Tool Score: 0    Physical Exam Constitutional:      General: She is not in acute distress.    Appearance: She is not diaphoretic.  HENT:     Nose: No congestion.  Cardiovascular:     Rate and Rhythm: Normal rate.  Pulmonary:     Effort: No respiratory distress.  Chest:     Chest wall: No tenderness.  Neurological:     Mental Status: She is alert and oriented to person, place, and time.  Psychiatric:        Attention and Perception: Attention and perception normal.        Mood and Affect: Mood is anxious.        Speech: Speech normal.        Behavior: Behavior is cooperative.    Review of Systems  Constitutional:  Negative for chills, diaphoresis and fever.  HENT:  Negative for congestion.   Respiratory:  Negative for cough, shortness of breath and wheezing.   Cardiovascular:  Negative for chest pain and palpitations.  Gastrointestinal:  Negative for diarrhea, nausea and vomiting.  Neurological:  Positive for tremors. Negative for dizziness and headaches.  Psychiatric/Behavioral:  The patient is nervous/anxious.     Blood pressure (!) 140/68, pulse 79, temperature 97.8 F (36.6 C), temperature source Oral, resp. rate 18, SpO2 98%. There is no height or weight on file to calculate BMI.  Past Psychiatric History: See H & P   Is the patient at risk to self? Yes  Has the patient been a risk to self in the past 6 months?  Yes .    Has the patient been a risk to self within the distant past? Yes   Is the patient a risk to others? No   Has the patient been a risk to others in the past 6 months? No   Has the patient been a risk to others within the distant past? No   Past Medical History: See Chart  Family History: N/A  Social History: N/A  Last Labs:  Admission on 03/02/2024, Discharged on 03/03/2024  Component Date Value Ref Range Status   Color, Urine 03/03/2024 STRAW (A)  YELLOW Final   APPearance 03/03/2024 CLEAR  CLEAR Final   Specific  Gravity, Urine 03/03/2024 1.004 (L)  1.005 - 1.030 Final   pH 03/03/2024 7.0  5.0 - 8.0 Final   Glucose, UA 03/03/2024 NEGATIVE  NEGATIVE mg/dL Final   Hgb urine dipstick 03/03/2024 SMALL (A)  NEGATIVE Final   Bilirubin Urine 03/03/2024 NEGATIVE  NEGATIVE Final   Ketones, ur 03/03/2024 NEGATIVE  NEGATIVE mg/dL Final   Protein, ur 98/78/7973 NEGATIVE  NEGATIVE mg/dL Final   Nitrite 98/78/7973 NEGATIVE  NEGATIVE Final   Leukocytes,Ua 03/03/2024 MODERATE (A)  NEGATIVE Final   RBC / HPF 03/03/2024 0-5  0 - 5 RBC/hpf Final   WBC, UA 03/03/2024 6-10  0 - 5 WBC/hpf Final   Bacteria, UA 03/03/2024 RARE (A)  NONE SEEN Final   Squamous Epithelial / HPF 03/03/2024 0-5  0 - 5 /HPF Final   Mucus 03/03/2024 PRESENT   Final   Performed at [facility], [address], [place], [place] [address]   Opiates 03/03/2024 NEGATIVE  NEGATIVE Final   Cocaine 03/03/2024 NEGATIVE  NEGATIVE Final   Benzodiazepines 03/03/2024 NEGATIVE  NEGATIVE Final   Amphetamines 03/03/2024 NEGATIVE  NEGATIVE Final   Tetrahydrocannabinol 03/03/2024 NEGATIVE  NEGATIVE Final   Barbiturates 03/03/2024 NEGATIVE  NEGATIVE Final   Methadone Scn, Ur 03/03/2024 NEGATIVE  NEGATIVE Final   Fentanyl  03/03/2024 NEGATIVE  NEGATIVE Final   Comment: (NOTE) Drug screen is for Medical Purposes only. Positive results are preliminary only. If confirmation is needed, notify lab within 5 days.  Drug Class                 Cutoff (ng/mL) Amphetamine and metabolites 1000 Barbiturate and metabolites 200 Benzodiazepine              200 Opiates and metabolites     300 Cocaine and metabolites     300 THC                         50 Fentanyl                     5 Methadone                   300  Trazodone  is metabolized in vivo to several metabolites,  including pharmacologically active m-CPP, which is excreted in the  urine.  Immunoassay screens for amphetamines and MDMA have potential  cross-reactivity with these compounds and may provide  false positive  result.  Performed at [facility], [address], [place], [place] [address]    Alcohol, Ethyl (B) 03/02/2024 <15  <15 mg/dL Final   Comment: (NOTE) For medical purposes only. Performed at [facility], [address] [place], [place] [address]    Acetaminophen  (Tylenol ), Serum 03/02/2024 <10 (L)  10 - 30 ug/mL Final   Comment: (NOTE)  Toxic concentrations can be more effectively related to post dose interval; >200, >100, and >50 ug/mL serum concentrations correspond to toxic concentrations at 4, 8, and 12 hours post dose, respectively.  Performed at [facility], [address] [place], [place] [address]    Salicylate Lvl 03/02/2024 <7.0 (L)  7.0 - 30.0 mg/dL Final   Performed at [facility], [address] [place], [place] [address]   WBC 03/02/2024 6.4  4.0 - 10.5 K/uL Final   RBC 03/02/2024 4.19  3.87 - 5.11 MIL/uL Final   Hemoglobin 03/02/2024 13.1  12.0 - 15.0 g/dL Final   HCT 98/79/7973 39.1  36.0 - 46.0 % Final   MCV 03/02/2024 93.3  80.0 - 100.0 fL Final   MCH 03/02/2024 31.3  26.0 - 34.0 pg Final   MCHC 03/02/2024 33.5  30.0 - 36.0 g/dL Final   RDW 98/79/7973 13.1  11.5 - 15.5 % Final   Platelets 03/02/2024 262  150 - 400 K/uL Final   nRBC 03/02/2024 0.0  0.0 - 0.2 % Final   Neutrophils Relative % 03/02/2024 60  % Final   Neutro Abs 03/02/2024 3.9  1.7 - 7.7 K/uL Final   Lymphocytes Relative 03/02/2024 18  % Final   Lymphs Abs 03/02/2024 1.2  0.7 - 4.0 K/uL Final   Monocytes Relative 03/02/2024 19  % Final   Monocytes Absolute 03/02/2024 1.2 (H)  0.1 - 1.0 K/uL Final   Eosinophils Relative 03/02/2024 1  % Final   Eosinophils Absolute 03/02/2024 0.1  0.0 - 0.5 K/uL Final   Basophils Relative 03/02/2024 1  % Final   Basophils Absolute 03/02/2024 0.0  0.0 - 0.1 K/uL Final   Immature Granulocytes 03/02/2024 1  % Final   Abs Immature Granulocytes 03/02/2024 0.03  0.00 - 0.07 K/uL Final   Performed at [facility], [address] [place], [place] [address]   Sodium 03/02/2024 133 (L)  135 - 145 mmol/L Final   Potassium 03/02/2024 3.8  3.5 - 5.1 mmol/L Final   HEMOLYSIS AT THIS LEVEL MAY AFFECT RESULT   Chloride 03/02/2024 94 (L)  98 - 111 mmol/L Final   CO2 03/02/2024 30  22 - 32 mmol/L Final   Glucose, Bld 03/02/2024 91  70 - 99 mg/dL Final   Glucose reference range applies only to samples taken after fasting for at least 8 hours.   BUN 03/02/2024 14  8 - 23 mg/dL Final   Creatinine, Ser 03/02/2024 0.85  0.44 - 1.00 mg/dL Final   Calcium 98/79/7973 9.5  8.9 - 10.3 mg/dL Final   Total Protein 98/79/7973 7.1  6.5 - 8.1 g/dL Final   Albumin 98/79/7973 4.3  3.5 - 5.0 g/dL Final   AST 98/79/7973 36  15 - 41 U/L Final   HEMOLYSIS AT THIS LEVEL MAY AFFECT RESULT   ALT 03/02/2024 17  0 - 44 U/L Final   Alkaline Phosphatase 03/02/2024 81  38 - 126 U/L Final   Total Bilirubin 03/02/2024 0.3  0.0 - 1.2 mg/dL Final   GFR, Estimated 03/02/2024 >60  >60 mL/min Final   Comment: (NOTE) Calculated using the CKD-EPI Creatinine Equation (2021)    Anion gap 03/02/2024 10  5 - 15 Final   Performed at [facility], [address], [place], [place] [address]   Glucose-Capillary 03/03/2024 115 (H)  70 - 99 mg/dL Final   Glucose reference range applies only to samples taken after fasting for at least  8 hours.  Admission on 03/02/2024, Discharged on 03/02/2024  Component Date Value Ref Range Status   Glucose-Capillary 03/02/2024 310 (H)  70 - 99 mg/dL Final   Glucose reference range applies only to samples taken after fasting for at least 8 hours.   Comment 1 03/02/2024 Notify RN   Final  Admission on 02/28/2024, Discharged on 02/28/2024  Component Date Value Ref Range Status   Glucose-Capillary 02/28/2024 144 (H)  70 - 99 mg/dL Final   Glucose reference range applies only to samples taken after fasting for at least 8 hours.   Sodium 02/28/2024 131 (L)  135 - 145 mmol/L Final   Potassium 02/28/2024 3.5  3.5 -  5.1 mmol/L Final   Chloride 02/28/2024 92 (L)  98 - 111 mmol/L Final   CO2 02/28/2024 26  22 - 32 mmol/L Final   Glucose, Bld 02/28/2024 147 (H)  70 - 99 mg/dL Final   Glucose reference range applies only to samples taken after fasting for at least 8 hours.   BUN 02/28/2024 14  8 - 23 mg/dL Final   Creatinine, Ser 02/28/2024 0.83  0.44 - 1.00 mg/dL Final   Calcium 98/82/7973 9.8  8.9 - 10.3 mg/dL Final   Total Protein 98/82/7973 7.4  6.5 - 8.1 g/dL Final   Albumin 98/82/7973 4.6  3.5 - 5.0 g/dL Final   AST 98/82/7973 44 (H)  15 - 41 U/L Final   ALT 02/28/2024 21  0 - 44 U/L Final   Alkaline Phosphatase 02/28/2024 79  38 - 126 U/L Final   Total Bilirubin 02/28/2024 0.3  0.0 - 1.2 mg/dL Final   GFR, Estimated 02/28/2024 >60  >60 mL/min Final   Comment: (NOTE) Calculated using the CKD-EPI Creatinine Equation (2021)    Anion gap 02/28/2024 13  5 - 15 Final   Performed at [facility], [address], [place], [place] [address]   Alcohol, Ethyl (B) 02/28/2024 <15  <15 mg/dL Final   Comment: (NOTE) For medical purposes only. Performed at [facility], [address] [place], [place] [address]    Opiates 02/28/2024 NEGATIVE  NEGATIVE Final   Cocaine 02/28/2024 NEGATIVE  NEGATIVE Final   Benzodiazepines 02/28/2024 POSITIVE (A)  NEGATIVE Final   Amphetamines 02/28/2024 NEGATIVE  NEGATIVE Final   Tetrahydrocannabinol 02/28/2024 NEGATIVE  NEGATIVE Final   Barbiturates 02/28/2024 NEGATIVE  NEGATIVE Final   Methadone Scn, Ur 02/28/2024 NEGATIVE  NEGATIVE Final   Fentanyl  02/28/2024 NEGATIVE  NEGATIVE Final   Comment: (NOTE) Drug screen is for Medical Purposes only. Positive results are preliminary only. If confirmation is needed, notify lab within 5 days.  Drug Class                 Cutoff (ng/mL) Amphetamine and metabolites 1000 Barbiturate and metabolites 200 Benzodiazepine              200 Opiates and metabolites     300 Cocaine and metabolites      300 THC                         50 Fentanyl                     5 Methadone                   300  Trazodone  is metabolized in vivo to several metabolites,  including pharmacologically active m-CPP, which is excreted in the  urine.  Immunoassay screens for amphetamines and MDMA have potential  cross-reactivity with these compounds and may provide false positive  result.  Performed at [name], [address] [place], [place] [address]    WBC 02/28/2024 6.8  4.0 - 10.5 K/uL Final   RBC 02/28/2024 4.16  3.87 - 5.11 MIL/uL Final   Hemoglobin 02/28/2024 13.1  12.0 - 15.0 g/dL Final   HCT 98/82/7973 38.0  36.0 - 46.0 % Final   MCV 02/28/2024 91.3  80.0 - 100.0 fL Final   MCH 02/28/2024 31.5  26.0 - 34.0 pg Final   MCHC 02/28/2024 34.5  30.0 - 36.0 g/dL Final   RDW 98/82/7973 12.8  11.5 - 15.5 % Final   Platelets 02/28/2024 261  150 - 400 K/uL Final   nRBC 02/28/2024 0.0  0.0 - 0.2 % Final   Neutrophils Relative % 02/28/2024 75  % Final   Neutro Abs 02/28/2024 5.0  1.7 - 7.7 K/uL Final   Lymphocytes Relative 02/28/2024 12  % Final   Lymphs Abs 02/28/2024 0.8  0.7 - 4.0 K/uL Final   Monocytes Relative 02/28/2024 13  % Final   Monocytes Absolute 02/28/2024 0.9  0.1 - 1.0 K/uL Final   Eosinophils Relative 02/28/2024 0  % Final   Eosinophils Absolute 02/28/2024 0.0  0.0 - 0.5 K/uL Final   Basophils Relative 02/28/2024 0  % Final   Basophils Absolute 02/28/2024 0.0  0.0 - 0.1 K/uL Final   Immature Granulocytes 02/28/2024 0  % Final   Abs Immature Granulocytes 02/28/2024 0.02  0.00 - 0.07 K/uL Final   Performed at [facility], [address] [place], [place] [address]   Salicylate Lvl 02/28/2024 <7.0 (L)  7.0 - 30.0 mg/dL Final   Performed at [facility], [address] [place], [place] [address]   Acetaminophen  (Tylenol ), Serum 02/28/2024 <10 (L)  10 - 30 ug/mL Final   Comment: (NOTE) Toxic concentrations can be more  effectively related to post dose interval; >200, >100, and >50 ug/mL serum concentrations correspond to toxic concentrations at 4, 8, and 12 hours post dose, respectively.  Performed at [facility], [address] [place], [place] [address]    Color, Urine 02/28/2024 YELLOW  YELLOW Final   APPearance 02/28/2024 CLEAR  CLEAR Final   Specific Gravity, Urine 02/28/2024 1.010  1.005 - 1.030 Final   pH 02/28/2024 7.0  5.0 - 8.0 Final   Glucose, UA 02/28/2024 NEGATIVE  NEGATIVE mg/dL Final   Hgb urine dipstick 02/28/2024 SMALL (A)  NEGATIVE Final   Bilirubin Urine 02/28/2024 NEGATIVE  NEGATIVE Final   Ketones, ur 02/28/2024 NEGATIVE  NEGATIVE mg/dL Final   Protein, ur 98/82/7973 NEGATIVE  NEGATIVE mg/dL Final   Nitrite 98/82/7973 NEGATIVE  NEGATIVE Final   Leukocytes,Ua 02/28/2024 NEGATIVE  NEGATIVE Final   RBC / HPF 02/28/2024 0-5  0 - 5 RBC/hpf Final   WBC, UA 02/28/2024 0-5  0 - 5 WBC/hpf Final   Bacteria, UA 02/28/2024 RARE (A)  NONE SEEN Final   Squamous Epithelial / HPF 02/28/2024 0-5  0 - 5 /HPF Final   Performed at [facility], [address], [place], [place] [address]  Admission on 02/23/2024, Discharged on 02/23/2024  Component Date Value Ref Range Status   SARS Coronavirus 2 by RT PCR 02/23/2024 NEGATIVE  NEGATIVE Final   Comment: (NOTE) SARS-CoV-2 target nucleic acids are NOT DETECTED.  The SARS-CoV-2 RNA is generally detectable in upper  respiratory specimens during the acute phase of infection. The lowest concentration of SARS-CoV-2 viral copies this assay can detect is 138 copies/mL. A negative result does not preclude SARS-Cov-2 infection and should not be used as the sole basis for treatment or other patient management decisions. A negative result may occur with  improper specimen collection/handling, submission of specimen other than nasopharyngeal swab, presence of viral mutation(s) within the areas targeted by this assay,  and inadequate number of viral copies(<138 copies/mL). A negative result must be combined with clinical observations, patient history, and epidemiological information. The expected result is Negative.  Fact Sheet for Patients:  bloggercourse.com  Fact Sheet for Healthcare Providers:  seriousbroker.it  This test is no                          t yet approved or cleared by the [place]  FDA and  has been authorized for detection and/or diagnosis of SARS-CoV-2 by FDA under an Emergency Use Authorization (EUA). This EUA will remain  in effect (meaning this test can be used) for the duration of the COVID-19 declaration under Section 564(b)(1) of the Act, 21 U.S.C.section 360bbb-3(b)(1), unless the authorization is terminated  or revoked sooner.       Influenza A by PCR 02/23/2024 NEGATIVE  NEGATIVE Final   Influenza B by PCR 02/23/2024 NEGATIVE  NEGATIVE Final   Comment: (NOTE) The Xpert Xpress SARS-CoV-2/FLU/RSV plus assay is intended as an aid in the diagnosis of influenza from Nasopharyngeal swab specimens and should not be used as a sole basis for treatment. Nasal washings and aspirates are unacceptable for Xpert Xpress SARS-CoV-2/FLU/RSV testing.  Fact Sheet for Patients: bloggercourse.com  Fact Sheet for Healthcare Providers: seriousbroker.it  This test is not yet approved or cleared by the [place]  FDA and has been authorized for detection and/or diagnosis of SARS-CoV-2 by FDA under an Emergency Use Authorization (EUA). This EUA will remain in effect (meaning this test can be used) for the duration of the COVID-19 declaration under Section 564(b)(1) of the Act, 21 U.S.C. section 360bbb-3(b)(1), unless the authorization is terminated or revoked.     Resp Syncytial Virus by PCR 02/23/2024 NEGATIVE  NEGATIVE Final   Comment: (NOTE) Fact Sheet for  Patients: bloggercourse.com  Fact Sheet for Healthcare Providers: seriousbroker.it  This test is not yet approved or cleared by the [place]  FDA and has been authorized for detection and/or diagnosis of SARS-CoV-2 by FDA under an Emergency Use Authorization (EUA). This EUA will remain in effect (meaning this test can be used) for the duration of the COVID-19 declaration under Section 564(b)(1) of the Act, 21 U.S.C. section 360bbb-3(b)(1), unless the authorization is terminated or revoked.  Performed at [facility], [address], [place], [place] [address]    Color, Urine 02/23/2024 YELLOW  YELLOW Final   APPearance 02/23/2024 CLEAR  CLEAR Final   Specific Gravity, Urine 02/23/2024 1.018  1.005 - 1.030 Final   pH 02/23/2024 7.0  5.0 - 8.0 Final   Glucose, UA 02/23/2024 NEGATIVE  NEGATIVE mg/dL Final   Hgb urine dipstick 02/23/2024 TRACE (A)  NEGATIVE Final   Bilirubin Urine 02/23/2024 NEGATIVE  NEGATIVE Final   Ketones, ur 02/23/2024 NEGATIVE  NEGATIVE mg/dL Final   Protein, ur 98/87/7973 NEGATIVE  NEGATIVE mg/dL Final   Nitrite 98/87/7973 NEGATIVE  NEGATIVE Final   Leukocytes,Ua 02/23/2024 TRACE (A)  NEGATIVE Final   RBC / HPF 02/23/2024 0-5  0 - 5 RBC/hpf Final  WBC, UA 02/23/2024 0-5  0 - 5 WBC/hpf Final   Bacteria, UA 02/23/2024 RARE (A)  NONE SEEN Final   Squamous Epithelial / HPF 02/23/2024 0-5  0 - 5 /HPF Final   Performed at [facility], [address], [place], [place] [address]  Admission on 02/22/2024, Discharged on 02/22/2024  Component Date Value Ref Range Status   Sodium 02/22/2024 135  135 - 145 mmol/L Final   Potassium 02/22/2024 3.8  3.5 - 5.1 mmol/L Final   Chloride 02/22/2024 98  98 - 111 mmol/L Final   CO2 02/22/2024 26  22 - 32 mmol/L Final   Glucose, Bld 02/22/2024 122 (H)  70 - 99 mg/dL Final   Glucose reference range applies only to samples taken after  fasting for at least 8 hours.   BUN 02/22/2024 19  8 - 23 mg/dL Final   Creatinine, Ser 02/22/2024 0.88  0.44 - 1.00 mg/dL Final   Calcium 98/88/7973 9.7  8.9 - 10.3 mg/dL Final   Total Protein 98/88/7973 6.9  6.5 - 8.1 g/dL Final   Albumin 98/88/7973 4.2  3.5 - 5.0 g/dL Final   AST 98/88/7973 25  15 - 41 U/L Final   HEMOLYSIS AT THIS LEVEL MAY AFFECT RESULT   ALT 02/22/2024 9  0 - 44 U/L Final   Alkaline Phosphatase 02/22/2024 72  38 - 126 U/L Final   Total Bilirubin 02/22/2024 0.2  0.0 - 1.2 mg/dL Final   GFR, Estimated 02/22/2024 >60  >60 mL/min Final   Comment: (NOTE) Calculated using the CKD-EPI Creatinine Equation (2021)    Anion gap 02/22/2024 11  5 - 15 Final   Performed at [facility], [address] [place], [place] [address]   WBC 02/22/2024 5.4  4.0 - 10.5 K/uL Final   RBC 02/22/2024 4.13  3.87 - 5.11 MIL/uL Final   Hemoglobin 02/22/2024 12.6  12.0 - 15.0 g/dL Final   HCT 98/88/7973 39.3  36.0 - 46.0 % Final   MCV 02/22/2024 95.2  80.0 - 100.0 fL Final   MCH 02/22/2024 30.5  26.0 - 34.0 pg Final   MCHC 02/22/2024 32.1  30.0 - 36.0 g/dL Final   RDW 98/88/7973 13.2  11.5 - 15.5 % Final   Platelets 02/22/2024 227  150 - 400 K/uL Final   nRBC 02/22/2024 0.0  0.0 - 0.2 % Final   Neutrophils Relative % 02/22/2024 64  % Final   Neutro Abs 02/22/2024 3.4  1.7 - 7.7 K/uL Final   Lymphocytes Relative 02/22/2024 21  % Final   Lymphs Abs 02/22/2024 1.1  0.7 - 4.0 K/uL Final   Monocytes Relative 02/22/2024 13  % Final   Monocytes Absolute 02/22/2024 0.7  0.1 - 1.0 K/uL Final   Eosinophils Relative 02/22/2024 2  % Final   Eosinophils Absolute 02/22/2024 0.1  0.0 - 0.5 K/uL Final   Basophils Relative 02/22/2024 0  % Final   Basophils Absolute 02/22/2024 0.0  0.0 - 0.1 K/uL Final   Immature Granulocytes 02/22/2024 0  % Final   Abs Immature Granulocytes 02/22/2024 0.02  0.00 - 0.07 K/uL Final   Performed at [facility], [address],  [place], [place] [address]   Troponin T High Sensitivity 02/22/2024 <15  0 - 19 ng/L Final   Comment: (NOTE) Biotin concentrations > 1000 ng/mL falsely decrease TnT results.  Serial cardiac troponin measurements are suggested.  Refer to the Links section for chest pain algorithms and additional  guidance.  Performed at [facility], [address], [place], [place] [address]   Procedure visit on 12/10/2023  Component Date Value Ref Range Status   SURGICAL PATHOLOGY 12/10/2023    Final-Edited                   Value:SURGICAL PATHOLOGY [facility] [address], [address] [place], [place] [address] Telephone [phone] or [phone] Fax [phone]  REPORT OF SURGICAL PATHOLOGY   Accession #: [id number] Patient Name: LENETTE, RAU Visit # : [id number]  MRN: [id number] Physician: Avram Pitts DOB/Age [birth year] (Age: 36) Gender: F Collected Date: 12/10/2023 Received Date: 12/12/2023  FINAL DIAGNOSIS       1. Surgical [P], duodenal :       - UNREMARKABLE DUODENAL MUCOSA.      - NEGATIVE FOR FEATURES OF CELIAC, DYSPLASIA, AND MALIGNANCY.       2. Surgical [P], gastric antrum polyp :       - POLYPOID FRAGMENT OF ANTRAL MUCOSA WITH REACTIVE FOVEOLAR HYPERPLASIA.      - NEGATIVE FOR H. PYLORI, INTESTINAL METAPLASIA, DYSPLASIA, AND MALIGNANCY.       3. 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      - NEGATIVE FOR MALIGNANCY.       4. Surgical [P], colon, ascending, polyp (2) :                                - TUBULAR ADENOMA (MULTIPLE FRAGMENTS); NEGATIVE FOR HIGH-GRADE DYSPLASIA AND      MALIGNANCY.      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       ELECTRONIC SIGNATURE : Rubinas Md, Rexene , Sports Administrator, Electronic Signature  MICROSCOPIC DESCRIPTION  CASE COMMENTS STAINS USED IN  DIAGNOSIS: H&E H&E H&E H&E-2 H&E H&E-2    CLINICAL HISTORY  SPECIMEN(S) OBTAINED 1. Surgical [P], Duodenal 2. Surgical [P], Gastric Antrum Polyp 3. Surgical [P], Colon, Cecum, Polyp (1) 4. Surgical [P], Colon, Ascending, Polyp (2)  SPECIMEN COMMENTS: 1. Iron deficiency anemia, unspecified iron deficiency anemia type; history of helicobacter pylori infection; hx of colonic polyps; benign neoplasm of cecum; benign neoplasm of ascending colon SPECIMEN CLINICAL INFORMATION: 1. R/O celiac sprue 2. R/O other neoplasia 3. R/O adenoma 4. R/O adenoma    Gross Description 1. Received in formalin are tan, sof                         t tissue fragments that are submitted in toto.Number: multiple, Size: 0.2 cm smallest to 0.4 cm largest, (1B) ( TA ) 2. Received in formalin are tan, soft tissue fragments that are submitted in toto.Number: 2 Size: 0.3 cm, (1B) ( TA ) 3. Received in formalin are tan, soft tissue fragments that are submitted in toto.Number: 2, Size: 0.9 cm smallest to 1.0 cm largest = 3 sec, (1B) ( TA ) 4. Received in formalin are tan, soft tissue fragments that are submitted in toto.Number: 2, Size: 0.3 cm smallest to 1.5 cm largest = 3 sec, (1B) ( TA )        Report signed out from the following location(s) [facility] [place] [facility] [address] [place], [place] [address] CLIA #: [id number]  [facility] [address] [place], [place] [address] CLIA #: [id number]   Lab on 09/24/2023  Component Date Value Ref Range Status   Iron 09/24/2023 60  42 - 145 ug/dL Final   Transferrin 91/86/7974 314.0  212.0 - 360.0 mg/dL Final   Saturation Ratios 09/24/2023 13.6 (L)  20.0 - 50.0 % Final   Ferritin 09/24/2023 4.7 (L)  10.0 - 291.0 ng/mL Final   TIBC 09/24/2023 439.6  250.0 - 450.0 mcg/dL Final   Sodium 91/86/7974 137  135 - 145 mEq/L Final   Potassium 09/24/2023 3.7  3.5 - 5.1 mEq/L Final   Chloride 09/24/2023 99  96 - 112 mEq/L Final    CO2 09/24/2023 31  19 - 32 mEq/L Final   Glucose, Bld 09/24/2023 134 (H)  70 - 99 mg/dL Final   BUN 91/86/7974 15  6 - 23 mg/dL Final   Creatinine, Ser 09/24/2023 1.10  0.40 - 1.20 mg/dL Final   Total Bilirubin 09/24/2023 0.3  0.2 - 1.2 mg/dL Final   Alkaline Phosphatase 09/24/2023 55  39 - 117 U/L Final   AST 09/24/2023 14  0 - 37 U/L Final   ALT 09/24/2023 10  0 - 35 U/L Final   Total Protein 09/24/2023 6.5  6.0 - 8.3 g/dL Final   Albumin 91/86/7974 4.0  3.5 - 5.2 g/dL Final   GFR 91/86/7974 52.39 (L)  >60.00 mL/min Final   Calculated using the CKD-EPI Creatinine Equation (2021)   Calcium 09/24/2023 9.2  8.4 - 10.5 mg/dL Final   WBC 91/86/7974 3.9 (L)  4.0 - 10.5 K/uL Final   RBC 09/24/2023 4.43  3.87 - 5.11 Mil/uL Final   Hemoglobin 09/24/2023 13.1  12.0 - 15.0 g/dL Final   HCT 91/86/7974 39.7  36.0 - 46.0 % Final   MCV 09/24/2023 89.5  78.0 - 100.0 fl Final   MCHC 09/24/2023 33.1  30.0 - 36.0 g/dL Final   RDW 91/86/7974 14.5  11.5 - 15.5 % Final   Platelets 09/24/2023 191.0  150.0 - 400.0 K/uL Final   Neutrophils Relative % 09/24/2023 57.9  43.0 - 77.0 % Final   Lymphocytes Relative 09/24/2023 26.2  12.0 - 46.0 % Final   Monocytes Relative 09/24/2023 11.7  3.0 - 12.0 % Final   Eosinophils Relative 09/24/2023 3.2  0.0 - 5.0 % Final   Basophils Relative 09/24/2023 1.0  0.0 - 3.0 % Final   Neutro Abs 09/24/2023 2.3  1.4 - 7.7 K/uL Final   Lymphs Abs 09/24/2023 1.0  0.7 - 4.0 K/uL Final   Monocytes Absolute 09/24/2023 0.5  0.1 - 1.0 K/uL Final   Eosinophils Absolute 09/24/2023 0.1  0.0 - 0.7 K/uL Final   Basophils Absolute 09/24/2023 0.0  0.0 - 0.1 K/uL Final    Allergies: Methadone, Nitrofurantoin, Oxycodone-acetaminophen , Percocet [oxycodone-acetaminophen ], Penicillins, Amoxicillin, Aspirin, Clarithromycin, Codeine, Hydrocodone -acetaminophen , Morphine, Ms contin [morphine sulfate], and Propoxyphene  Medications:  Facility Ordered Medications  Medication   0.9 %  sodium  chloride infusion   [COMPLETED] fosfomycin  (MONUROL ) packet 3 g   OLANZapine  zydis (ZYPREXA ) disintegrating tablet 5 mg   OLANZapine  (ZYPREXA ) injection 5 mg   [COMPLETED] propranolol  (INDERAL ) tablet 10 mg   PTA Medications  Medication Sig   EPIPEN  2-PAK 0.3 MG/0.3ML SOAJ injection Inject 0.3 mg into the muscle as needed for anaphylaxis.   omeprazole  (PRILOSEC) 40 MG capsule Take 40 mg by mouth daily.   DULoxetine  (CYMBALTA ) 60 MG capsule Take 120 mg by mouth daily.   hydrochlorothiazide  (MICROZIDE ) 12.5 MG capsule Take 12.5 mg  by mouth daily.   traZODone  (DESYREL ) 100 MG tablet Take 200 mg by mouth at bedtime.   Blood Glucose Monitoring Suppl (ACCU-CHEK GUIDE) w/Device KIT    Blood Glucose Monitoring Suppl (GLUCOCOM BLOOD GLUCOSE MONITOR) DEVI Check blood sugars three times a day   gabapentin  (NEURONTIN ) 300 MG capsule Take 300 mg by mouth 2 (two) times daily.   Accu-Chek Softclix Lancets lancets 3 (three) times daily. for testing   albuterol  (VENTOLIN  HFA) 108 (90 Base) MCG/ACT inhaler Inhale 2 puffs into the lungs every 4 (four) hours as needed for wheezing or shortness of breath.   buPROPion  (WELLBUTRIN  XL) 150 MG 24 hr tablet Take 450 mg by mouth every morning.   levothyroxine  (SYNTHROID ) 25 MCG tablet Take 25 mcg by mouth daily.   cholecalciferol  (VITAMIN D3) 25 MCG (1000 UNIT) tablet Take 1,000 Units by mouth daily.   ibuprofen (ADVIL) 200 MG tablet Take 800 mg by mouth every 6 (six) hours as needed.   iron polysaccharides (NIFEREX) 150 MG capsule Take 150 mg by mouth daily.   zolpidem  (AMBIEN ) 10 MG tablet Take 10 mg by mouth at bedtime as needed.   risperiDONE  (RISPERDAL ) 1 MG tablet Take 1 mg by mouth 3 (three) times daily.   levocetirizine (XYZAL ) 5 MG tablet Take 1 tablet (5 mg total) by mouth every evening.   Olopatadine  HCl (PATADAY ) 0.2 % SOLN Place 1 drop into both eyes daily as needed.   Olopatadine -Mometasone  (RYALTRIS ) 665-25 MCG/ACT SUSP Place 2 sprays into the nose 2  (two) times daily.   SYMBICORT  160-4.5 MCG/ACT inhaler Inhale 2 puffs into the lungs 2 (two) times daily.   Azelastine-Fluticasone  (DYMISTA) 137-50 MCG/ACT SUSP PLACE 1 SPRAY INTO THE NOSE 2 (TWO) TIMES DAILY.   Multiple Vitamin (MULTI VITAMIN PO) Take 1 tablet by mouth daily. (Patient not taking: No sig reported)   INGREZZA  60 MG capsule Take 60 mg by mouth at bedtime.   nebivolol  (BYSTOLIC ) 5 MG tablet Take 5 mg by mouth daily.   prednisoLONE acetate (PRED FORTE) 1 % ophthalmic suspension PLEASE SEE ATTACHED FOR DETAILED DIRECTIONS (Patient not taking: No sig reported)   docusate sodium (COLACE) 100 MG capsule Take 200 mg by mouth daily.   hydrochlorothiazide  (HYDRODIURIL ) 12.5 MG tablet Take 12.5 mg by mouth daily.   trazodone  (DESYREL ) 300 MG tablet Take 300 mg by mouth at bedtime.   clonazePAM  (KLONOPIN ) 0.5 MG tablet Take 1 tablet (0.5 mg total) by mouth 3 (three) times daily as needed for anxiety.      Medical Decision Making  Recommend admission to continuous observation unit for safety monitoring and reval in the am.  Recommend collaboration with outpatient psychiatric provider in the am for medication management. Patient is unable to confirm the current dosage of her home medications.  Recommend collaboration with outpatient psychiatric provider in the am for medication management. Discussed use of propranolol  10 mg p.o. once for HTN, anxiety, and insomnia, and reevaluate tolerance in the a.m.   Patient education provided on risks, benefits, and alternatives to treatment. Patient is provided with opportunity for questions.  She verbalized understanding and is in agreement.  I have reviewed the labs and medications administered at [facility].  Prn meds -Propanolol 10 mg PO once for Anxiety, HTN and tremors -Agitation protocol medications   Recommendations  Based on my evaluation the patient does not appear to have an emergency medical condition.  Recommend admission to continuous  observation unit for safety monitoring and reval in the am.  Recommend collaboration  with outpatient psychiatric provider in the am for medication management.  Thurman LULLA Ivans, NP 03/03/24  5:20 AM

## 2024-03-03 NOTE — ED Notes (Signed)
 Pt is lying in bed quietly, awake at time of assessment.  She appears anxious.  R upper and bilateral lower extremity tremors notes.  Pt reports the tremors started a couple of weeks ago.   Admits to severe anxiety. Denies SI/HI/AVH.  Denies any difficulty with voiding or moving her bowels.  No shortness of breath.  Pt. reports having non--productive cough.  Not witnessed. Abrasions on left posterior FA open to air and are dry with out signs and symptoms of infection.

## 2024-03-03 NOTE — ED Notes (Signed)
 Pt to return to [facility] by safe transport. Paperwork reviewed by charge RN prior to dc.

## 2024-03-04 DIAGNOSIS — F419 Anxiety disorder, unspecified: Secondary | ICD-10-CM | POA: Diagnosis not present

## 2024-03-04 DIAGNOSIS — R4589 Other symptoms and signs involving emotional state: Secondary | ICD-10-CM | POA: Diagnosis not present

## 2024-03-04 DIAGNOSIS — Z79899 Other long term (current) drug therapy: Secondary | ICD-10-CM | POA: Diagnosis not present

## 2024-03-04 DIAGNOSIS — F1320 Sedative, hypnotic or anxiolytic dependence, uncomplicated: Secondary | ICD-10-CM | POA: Diagnosis not present

## 2024-03-04 DIAGNOSIS — F4310 Post-traumatic stress disorder, unspecified: Secondary | ICD-10-CM

## 2024-03-04 MED ORDER — DULOXETINE HCL 60 MG PO CPEP
60.0000 mg | ORAL_CAPSULE | Freq: Every day | ORAL | Status: DC
  Administered 2024-03-04: 60 mg via ORAL
  Filled 2024-03-04: qty 1

## 2024-03-04 MED ORDER — WHITE PETROLATUM EX OINT
TOPICAL_OINTMENT | CUTANEOUS | Status: AC
  Administered 2024-03-04: 1
  Filled 2024-03-04: qty 5

## 2024-03-04 MED ORDER — CLONAZEPAM 0.5 MG PO TABS: 0.5000 mg | ORAL_TABLET | Freq: Every day | ORAL | Status: DC

## 2024-03-04 MED ORDER — PROPRANOLOL HCL 10 MG PO TABS
10.0000 mg | ORAL_TABLET | Freq: Two times a day (BID) | ORAL | Status: DC | PRN
  Administered 2024-03-04 (×2): 10 mg via ORAL
  Filled 2024-03-04 (×2): qty 1

## 2024-03-04 MED ORDER — VALBENAZINE TOSYLATE 60 MG PO CAPS
60.0000 mg | ORAL_CAPSULE | Freq: Every day | ORAL | Status: DC
  Administered 2024-03-04: 60 mg via ORAL
  Filled 2024-03-04 (×2): qty 1

## 2024-03-04 NOTE — Progress Notes (Signed)
 LCSW Progress Note  [id number]   Becky [name]  03/04/2024  9:19 AM  Description:   Inpatient Psychiatric Referral  Patient was recommended inpatient per  Thurman Ivans (NP). There are no available beds at [facility], per [facility] Carlo RN). Patient was referred to the following out of network facilities:    [facility] Provider Address Phone Fax  [facility]  [address], [place] [address] [phone]  [facility]  [address] [place], [place] [address] [phone]  [facility]  [address] [place] [address] [phone]  [facility]  [address] [place] [address] [phone]  [facility]  [address], [place] [address] [phone]  [facility]  [address], [place] [address] [phone]  [facility] EFAX  [address] [place], [place] [phone]  [facility]  [address] [place] [address] [phone]  [facility]  [address] [place] [address] [phone]  [facility]  [address] [place] [address] [phone]      Situation ongoing, CSW to continue following and update chart as more information becomes available.    Tunisia Shaleta Ruacho, MSW, LCSW  03/04/2024 9:19 AM

## 2024-03-04 NOTE — Progress Notes (Signed)
 Inpatient Psychiatric Referral  Patient was recommended inpatient per Prentice Espy, MD. There are no available beds at [facility], per [facility] . Patient was referred to the following out of network facilities:  [facility] Provider Address Phone Fax  [facility]  [address] [place] [address] [phone]  [facility]  [address] [place], [place] [address] [phone]  [facility]  [address] [place] [address] [phone]  [facility]  [address] [place] [address] [phone]  [facility]  [address], [place] [address] [phone]  [facility]  [address], [place] [address] [phone]  [facility] EFAX  [address] [place], [place] [phone]  [facility]  [address] [place] [address] [phone]  [facility]  [address] [place] [address] [phone]  [facility]  [address], [place] [address] [phone]  [facility]  [address] [place] [address] [phone]  [facility]  [address] [place] [address] [phone]    Situation ongoing, CSW to continue following and update chart as more information becomes available.   Harrie Sofia MSW, [place] 03/04/2024

## 2024-03-04 NOTE — Progress Notes (Signed)
 Pt has been accepted to [facility] on 03/04/2024 . Bed assignment: Main campus  Pt meets inpatient criteria per Prentice Espy, MD   Attending Physician will be Millie Manners, MD  Report can be called to: [phone] (this is a pager, please leave call-back number when giving report)  Pt can arrive after ASAP  Care Team Notified: Cloyd Roach, RN, Garvin Gaines, MD

## 2024-03-04 NOTE — ED Provider Notes (Signed)
 Behavioral Health Progress Note  Date and Time: 03/04/2024 9:19 AM Name: Becky [name] MRN:  [id number]  Subjective: Patient reassessed on the unit this morning. Patient moving very slowly. She reports poor sleep due to baseline insomnia. She feels weaker than usual but she doesn't know why. She reports she has been having multiple falls but states she denies feeling dizzy or symptoms that would suggest orthostasis. Denies feeling that falls are mechanical in nature. Patient axox4. She feels very anxious and is observed moving from bed to wheelchair and back a few times during assessment. Denies any other somatic complaints at this time. Denies SI/HI/AVH.      Diagnosis:  Final diagnoses:  Anxious appearance  Encounter for medication management  Anxiety   Total Time spent with patient: 20 minutes  Past Psychiatric History: History of bipolar type II, MDD, PTSD, DID, mild cognitive impairment, chronic insomnia, adjustment disorder with mixed anxiety and depressed mood Current Home Meds: Zolpidem , Wellbutrin , Cymbalta , trazodone , Risperdal , and Ativan , Gabapentin .  Patient sees Rock Massa, NP (email: [email or web address]) and Idell Remington PA-C outpatient for med managament. Past Medical History: hypertension, benign paroxysmal positional vertigo/orthostatic hypotension.  Family Psychiatric History: Unknown Social History: Patient lives alone.  Sleep: Poor  Appetite: Fair  Current Medications:  Current Facility-Administered Medications  Medication Dose Route Frequency Provider Last Rate Last Admin   0.9 %  sodium chloride  infusion  500 mL Intravenous Once Avram Lupita BRAVO, MD       DULoxetine  (CYMBALTA ) DR capsule 60 mg  60 mg Oral Daily Damacio Weisgerber, MD       hydrochlorothiazide  (HYDRODIURIL ) tablet 12.5 mg  12.5 mg Oral Daily Stevens, Briana N, MD   12.5 mg at 03/03/24 1307   levothyroxine  (SYNTHROID ) tablet 25 mcg  25 mcg Oral Q0600 Stevens, Briana N, MD   25 mcg at 03/04/24 0545    LORazepam  (ATIVAN ) tablet 0.5 mg  0.5 mg Oral BID Stevens, Briana N, MD   0.5 mg at 03/03/24 2013   nebivolol  (BYSTOLIC ) tablet 5 mg  5 mg Oral Daily Stevens, Briana N, MD   5 mg at 03/03/24 1308   OLANZapine  (ZYPREXA ) injection 5 mg  5 mg Intramuscular TID PRN Onuoha, Chinwendu V, NP       OLANZapine  zydis (ZYPREXA ) disintegrating tablet 5 mg  5 mg Oral TID PRN Onuoha, Chinwendu V, NP       pantoprazole  (PROTONIX ) EC tablet 40 mg  40 mg Oral Daily Stevens, Briana N, MD   40 mg at 03/03/24 1308   risperiDONE  (RISPERDAL ) tablet 2 mg  2 mg Oral QHS Mannie Ashley SAILOR, MD   2 mg at 03/03/24 2014   Current Outpatient Medications  Medication Sig Dispense Refill   buPROPion  (WELLBUTRIN  XL) 150 MG 24 hr tablet Take 450 mg by mouth every morning.     cholecalciferol  (VITAMIN D3) 25 MCG (1000 UNIT) tablet Take 1,000 Units by mouth daily.     docusate sodium (COLACE) 100 MG capsule Take 200 mg by mouth daily as needed for mild constipation.     DULoxetine  (CYMBALTA ) 60 MG capsule Take 120 mg by mouth daily.     EPIPEN  2-PAK 0.3 MG/0.3ML SOAJ injection Inject 0.3 mg into the muscle as needed for anaphylaxis.  1   gabapentin  (NEURONTIN ) 300 MG capsule Take 300 mg by mouth daily.     hydrochlorothiazide  (HYDRODIURIL ) 12.5 MG tablet Take 12.5 mg by mouth daily.     INGREZZA  60 MG capsule Take 60 mg by mouth at  bedtime.     iron polysaccharides (NIFEREX) 150 MG capsule Take 150 mg by mouth daily.     levocetirizine (XYZAL ) 5 MG tablet Take 1 tablet (5 mg total) by mouth every evening. (Patient taking differently: Take 5 mg by mouth at bedtime.) 90 tablet 1   levothyroxine  (SYNTHROID ) 25 MCG tablet Take 25 mcg by mouth daily.     LORazepam  (ATIVAN ) 0.5 MG tablet Take 0.5 mg by mouth 2 (two) times daily.     nebivolol  (BYSTOLIC ) 5 MG tablet Take 5 mg by mouth daily.     Olopatadine  HCl (PATADAY ) 0.2 % SOLN Place 1 drop into both eyes daily as needed. (Patient taking differently: Place 1 drop into both eyes  daily as needed (For allergies).) 2.5 mL 5   omeprazole  (PRILOSEC) 40 MG capsule Take 40 mg by mouth at bedtime.     risperiDONE  (RISPERDAL ) 1 MG tablet Take 1-2 mg by mouth 2 (two) times daily. Take one tablet in the morning and two tablets at bedtime     traZODone  (DESYREL ) 100 MG tablet Take 200 mg by mouth at bedtime.     zolpidem  (AMBIEN ) 10 MG tablet Take 10 mg by mouth at bedtime.     clonazePAM  (KLONOPIN ) 0.5 MG tablet Take 1 tablet (0.5 mg total) by mouth 3 (three) times daily as needed for anxiety. (Patient not taking: Reported on 03/03/2024) 30 tablet 0    Labs  Lab Results:  Admission on 03/02/2024, Discharged on 03/03/2024  Component Date Value Ref Range Status   Color, Urine 03/03/2024 STRAW (A)  YELLOW Final   APPearance 03/03/2024 CLEAR  CLEAR Final   Specific Gravity, Urine 03/03/2024 1.004 (L)  1.005 - 1.030 Final   pH 03/03/2024 7.0  5.0 - 8.0 Final   Glucose, UA 03/03/2024 NEGATIVE  NEGATIVE mg/dL Final   Hgb urine dipstick 03/03/2024 SMALL (A)  NEGATIVE Final   Bilirubin Urine 03/03/2024 NEGATIVE  NEGATIVE Final   Ketones, ur 03/03/2024 NEGATIVE  NEGATIVE mg/dL Final   Protein, ur 98/78/7973 NEGATIVE  NEGATIVE mg/dL Final   Nitrite 98/78/7973 NEGATIVE  NEGATIVE Final   Leukocytes,Ua 03/03/2024 MODERATE (A)  NEGATIVE Final   RBC / HPF 03/03/2024 0-5  0 - 5 RBC/hpf Final   WBC, UA 03/03/2024 6-10  0 - 5 WBC/hpf Final   Bacteria, UA 03/03/2024 RARE (A)  NONE SEEN Final   Squamous Epithelial / HPF 03/03/2024 0-5  0 - 5 /HPF Final   Mucus 03/03/2024 PRESENT   Final   Performed at [facility], [address], [place], [place] [address]   Opiates 03/03/2024 NEGATIVE  NEGATIVE Final   Cocaine 03/03/2024 NEGATIVE  NEGATIVE Final   Benzodiazepines 03/03/2024 NEGATIVE  NEGATIVE Final   Amphetamines 03/03/2024 NEGATIVE  NEGATIVE Final   Tetrahydrocannabinol 03/03/2024 NEGATIVE  NEGATIVE Final   Barbiturates 03/03/2024 NEGATIVE  NEGATIVE Final   Methadone Scn, Ur  03/03/2024 NEGATIVE  NEGATIVE Final   Fentanyl  03/03/2024 NEGATIVE  NEGATIVE Final   Comment: (NOTE) Drug screen is for Medical Purposes only. Positive results are preliminary only. If confirmation is needed, notify lab within 5 days.  Drug Class                 Cutoff (ng/mL) Amphetamine and metabolites 1000 Barbiturate and metabolites 200 Benzodiazepine              200 Opiates and metabolites     300 Cocaine and metabolites     300 THC  50 Fentanyl                     5 Methadone                   300  Trazodone  is metabolized in vivo to several metabolites,  including pharmacologically active m-CPP, which is excreted in the  urine.  Immunoassay screens for amphetamines and MDMA have potential  cross-reactivity with these compounds and may provide false positive  result.  Performed at [facility], [address], [place], [place] [address]    Alcohol, Ethyl (B) 03/02/2024 <15  <15 mg/dL Final   Comment: (NOTE) For medical purposes only. Performed at [facility], [address] [place], [place] [address]    Acetaminophen  (Tylenol ), Serum 03/02/2024 <10 (L)  10 - 30 ug/mL Final   Comment: (NOTE) Toxic concentrations can be more effectively related to post dose interval; >200, >100, and >50 ug/mL serum concentrations correspond to toxic concentrations at 4, 8, and 12 hours post dose, respectively.  Performed at [facility], [address], [place], [place] [address]    Salicylate Lvl 03/02/2024 <7.0 (L)  7.0 - 30.0 mg/dL Final   Performed at [facility], [address] [place], [place] [address]   WBC 03/02/2024 6.4  4.0 - 10.5 K/uL Final   RBC 03/02/2024 4.19  3.87 - 5.11 MIL/uL Final   Hemoglobin 03/02/2024 13.1  12.0 - 15.0 g/dL Final   HCT 98/79/7973 39.1  36.0 - 46.0 % Final   MCV 03/02/2024 93.3  80.0 - 100.0 fL Final   MCH 03/02/2024 31.3  26.0 - 34.0 pg Final   MCHC 03/02/2024 33.5  30.0 - 36.0 g/dL Final    RDW 98/79/7973 13.1  11.5 - 15.5 % Final   Platelets 03/02/2024 262  150 - 400 K/uL Final   nRBC 03/02/2024 0.0  0.0 - 0.2 % Final   Neutrophils Relative % 03/02/2024 60  % Final   Neutro Abs 03/02/2024 3.9  1.7 - 7.7 K/uL Final   Lymphocytes Relative 03/02/2024 18  % Final   Lymphs Abs 03/02/2024 1.2  0.7 - 4.0 K/uL Final   Monocytes Relative 03/02/2024 19  % Final   Monocytes Absolute 03/02/2024 1.2 (H)  0.1 - 1.0 K/uL Final   Eosinophils Relative 03/02/2024 1  % Final   Eosinophils Absolute 03/02/2024 0.1  0.0 - 0.5 K/uL Final   Basophils Relative 03/02/2024 1  % Final   Basophils Absolute 03/02/2024 0.0  0.0 - 0.1 K/uL Final   Immature Granulocytes 03/02/2024 1  % Final   Abs Immature Granulocytes 03/02/2024 0.03  0.00 - 0.07 K/uL Final   Performed at [facility], [address] [place], [place] [address]   Sodium 03/02/2024 133 (L)  135 - 145 mmol/L Final   Potassium 03/02/2024 3.8  3.5 - 5.1 mmol/L Final   HEMOLYSIS AT THIS LEVEL MAY AFFECT RESULT   Chloride 03/02/2024 94 (L)  98 - 111 mmol/L Final   CO2 03/02/2024 30  22 - 32 mmol/L Final   Glucose, Bld 03/02/2024 91  70 - 99 mg/dL Final   Glucose reference range applies only to samples taken after fasting for at least 8 hours.   BUN 03/02/2024 14  8 - 23 mg/dL Final   Creatinine, Ser 03/02/2024 0.85  0.44 - 1.00 mg/dL Final   Calcium 98/79/7973 9.5  8.9 - 10.3 mg/dL Final   Total  Protein 03/02/2024 7.1  6.5 - 8.1 g/dL Final   Albumin 98/79/7973 4.3  3.5 - 5.0 g/dL Final   AST 98/79/7973 36  15 - 41 U/L Final   HEMOLYSIS AT THIS LEVEL MAY AFFECT RESULT   ALT 03/02/2024 17  0 - 44 U/L Final   Alkaline Phosphatase 03/02/2024 81  38 - 126 U/L Final   Total Bilirubin 03/02/2024 0.3  0.0 - 1.2 mg/dL Final   GFR, Estimated 03/02/2024 >60  >60 mL/min Final   Comment: (NOTE) Calculated using the CKD-EPI Creatinine Equation (2021)    Anion gap 03/02/2024 10  5 - 15 Final   Performed at [facility], [address] [place], [place] [address]   Glucose-Capillary 03/03/2024 115 (H)  70 - 99 mg/dL Final   Glucose reference range applies only to samples taken after fasting for at least 8 hours.  Admission on 03/02/2024, Discharged on 03/02/2024  Component Date Value Ref Range Status   Glucose-Capillary 03/02/2024 310 (H)  70 - 99 mg/dL Final   Glucose reference range applies only to samples taken after fasting for at least 8 hours.   Comment 1 03/02/2024 Notify RN   Final  Admission on 02/28/2024, Discharged on 02/28/2024  Component Date Value Ref Range Status   Glucose-Capillary 02/28/2024 144 (H)  70 - 99 mg/dL Final   Glucose reference range applies only to samples taken after fasting for at least 8 hours.   Sodium 02/28/2024 131 (L)  135 - 145 mmol/L Final   Potassium 02/28/2024 3.5  3.5 - 5.1 mmol/L Final   Chloride 02/28/2024 92 (L)  98 - 111 mmol/L Final   CO2 02/28/2024 26  22 - 32 mmol/L Final   Glucose, Bld 02/28/2024 147 (H)  70 - 99 mg/dL Final   Glucose reference range applies only to samples taken after fasting for at least 8 hours.   BUN 02/28/2024 14  8 - 23 mg/dL Final   Creatinine, Ser 02/28/2024 0.83  0.44 - 1.00 mg/dL Final   Calcium 98/82/7973 9.8  8.9 - 10.3 mg/dL Final   Total Protein 98/82/7973 7.4  6.5 - 8.1 g/dL Final   Albumin 98/82/7973 4.6  3.5 - 5.0 g/dL Final   AST 98/82/7973 44 (H)  15 - 41 U/L Final   ALT 02/28/2024 21  0 - 44 U/L Final   Alkaline Phosphatase 02/28/2024 79  38 - 126 U/L Final   Total Bilirubin 02/28/2024 0.3  0.0 - 1.2 mg/dL Final   GFR, Estimated 02/28/2024 >60  >60 mL/min Final   Comment: (NOTE) Calculated using the CKD-EPI Creatinine Equation (2021)    Anion gap 02/28/2024 13  5 - 15 Final   Performed at [facility], [address], [place], [place] [address]   Alcohol, Ethyl (B) 02/28/2024 <15  <15 mg/dL Final   Comment: (NOTE) For medical purposes only. Performed at [facility], [address], [place], [place] [address]    Opiates 02/28/2024 NEGATIVE  NEGATIVE Final   Cocaine 02/28/2024 NEGATIVE  NEGATIVE Final   Benzodiazepines 02/28/2024 POSITIVE (A)  NEGATIVE Final   Amphetamines 02/28/2024 NEGATIVE  NEGATIVE Final   Tetrahydrocannabinol 02/28/2024 NEGATIVE  NEGATIVE Final   Barbiturates 02/28/2024 NEGATIVE  NEGATIVE Final   Methadone Scn, Ur 02/28/2024 NEGATIVE  NEGATIVE Final   Fentanyl  02/28/2024 NEGATIVE  NEGATIVE Final   Comment: (NOTE) Drug screen is for Medical Purposes only. Positive results are preliminary only. If confirmation is needed, notify lab  within 5 days.  Drug Class                 Cutoff (ng/mL) Amphetamine and metabolites 1000 Barbiturate and metabolites 200 Benzodiazepine              200 Opiates and metabolites     300 Cocaine and metabolites     300 THC                         50 Fentanyl                     5 Methadone                   300  Trazodone  is metabolized in vivo to several metabolites,  including pharmacologically active m-CPP, which is excreted in the  urine.  Immunoassay screens for amphetamines and MDMA have potential  cross-reactivity with these compounds and may provide false positive  result.  Performed at [facility], [address], [place], [place] [address]    WBC 02/28/2024 6.8  4.0 - 10.5 K/uL Final   RBC 02/28/2024 4.16  3.87 - 5.11 MIL/uL Final   Hemoglobin 02/28/2024 13.1  12.0 - 15.0 g/dL Final   HCT 98/82/7973 38.0  36.0 - 46.0 % Final   MCV 02/28/2024 91.3  80.0 - 100.0 fL Final   MCH 02/28/2024 31.5  26.0 - 34.0 pg Final   MCHC 02/28/2024 34.5  30.0 - 36.0 g/dL Final   RDW 98/82/7973 12.8  11.5 - 15.5 % Final   Platelets 02/28/2024 261  150 - 400 K/uL Final   nRBC 02/28/2024 0.0  0.0 - 0.2 % Final   Neutrophils Relative % 02/28/2024 75  % Final   Neutro Abs 02/28/2024 5.0  1.7 - 7.7 K/uL Final   Lymphocytes Relative 02/28/2024 12  % Final   Lymphs Abs 02/28/2024 0.8  0.7 - 4.0  K/uL Final   Monocytes Relative 02/28/2024 13  % Final   Monocytes Absolute 02/28/2024 0.9  0.1 - 1.0 K/uL Final   Eosinophils Relative 02/28/2024 0  % Final   Eosinophils Absolute 02/28/2024 0.0  0.0 - 0.5 K/uL Final   Basophils Relative 02/28/2024 0  % Final   Basophils Absolute 02/28/2024 0.0  0.0 - 0.1 K/uL Final   Immature Granulocytes 02/28/2024 0  % Final   Abs Immature Granulocytes 02/28/2024 0.02  0.00 - 0.07 K/uL Final   Performed at [facility], [address], [place], [place] [address]   Salicylate Lvl 02/28/2024 <7.0 (L)  7.0 - 30.0 mg/dL Final   Performed at [facility], [address], [place], [place] [address]   Acetaminophen  (Tylenol ), Serum 02/28/2024 <10 (L)  10 - 30 ug/mL Final   Comment: (NOTE) Toxic concentrations can be more effectively related to post dose interval; >200, >100, and >50 ug/mL serum concentrations correspond to toxic concentrations at 4, 8, and 12 hours post dose, respectively.  Performed at [facility], [address] [place], [place] [address]    Color, Urine 02/28/2024 YELLOW  YELLOW Final   APPearance 02/28/2024 CLEAR  CLEAR Final   Specific Gravity, Urine 02/28/2024 1.010  1.005 - 1.030 Final   pH 02/28/2024 7.0  5.0 - 8.0 Final   Glucose, UA 02/28/2024 NEGATIVE  NEGATIVE mg/dL Final   Hgb urine dipstick 02/28/2024 SMALL (A)  NEGATIVE Final   Bilirubin Urine 02/28/2024 NEGATIVE  NEGATIVE Final   Ketones, ur 02/28/2024 NEGATIVE  NEGATIVE mg/dL Final   Protein, ur 98/82/7973 NEGATIVE  NEGATIVE mg/dL Final   Nitrite 98/82/7973 NEGATIVE  NEGATIVE Final   Leukocytes,Ua 02/28/2024 NEGATIVE  NEGATIVE Final   RBC / HPF 02/28/2024 0-5  0 - 5 RBC/hpf Final   WBC, UA 02/28/2024 0-5  0 - 5 WBC/hpf Final   Bacteria, UA 02/28/2024 RARE (A)  NONE SEEN Final   Squamous Epithelial / HPF 02/28/2024 0-5  0 - 5 /HPF Final   Performed at [facility], [address]  [place], [place] [address]  Admission on 02/23/2024, Discharged on 02/23/2024  Component Date Value Ref Range Status   SARS Coronavirus 2 by RT PCR 02/23/2024 NEGATIVE  NEGATIVE Final   Comment: (NOTE) SARS-CoV-2 target nucleic acids are NOT DETECTED.  The SARS-CoV-2 RNA is generally detectable in upper respiratory specimens during the acute phase of infection. The lowest concentration of SARS-CoV-2 viral copies this assay can detect is 138 copies/mL. A negative result does not preclude SARS-Cov-2 infection and should not be used as the sole basis for treatment or other patient management decisions. A negative result may occur with  improper specimen collection/handling, submission of specimen other than nasopharyngeal swab, presence of viral mutation(s) within the areas targeted by this assay, and inadequate number of viral copies(<138 copies/mL). A negative result must be combined with clinical observations, patient history, and epidemiological information. The expected result is Negative.  Fact Sheet for Patients:  bloggercourse.com  Fact Sheet for Healthcare Providers:  seriousbroker.it  This test is no                          t yet approved or cleared by the [place]  FDA and  has been authorized for detection and/or diagnosis of SARS-CoV-2 by FDA under an Emergency Use Authorization (EUA). This EUA will remain  in effect (meaning this test can be used) for the duration of the COVID-19 declaration under Section 564(b)(1) of the Act, 21 U.S.C.section 360bbb-3(b)(1), unless the authorization is terminated  or revoked sooner.       Influenza A by PCR 02/23/2024 NEGATIVE  NEGATIVE Final   Influenza B by PCR 02/23/2024 NEGATIVE  NEGATIVE Final   Comment: (NOTE) The Xpert Xpress SARS-CoV-2/FLU/RSV plus assay is intended as an aid in the diagnosis of influenza from Nasopharyngeal swab specimens and should not be used as a sole  basis for treatment. Nasal washings and aspirates are unacceptable for Xpert Xpress SARS-CoV-2/FLU/RSV testing.  Fact Sheet for Patients: bloggercourse.com  Fact Sheet for Healthcare Providers: seriousbroker.it  This test is not yet approved or cleared by the [place]  FDA and has been authorized for detection and/or diagnosis of SARS-CoV-2 by FDA under an Emergency Use Authorization (EUA). This EUA will remain in effect (meaning this test can be used) for the duration of the COVID-19 declaration under Section 564(b)(1) of the Act, 21 U.S.C. section 360bbb-3(b)(1), unless the authorization is terminated or revoked.     Resp Syncytial Virus by PCR 02/23/2024 NEGATIVE  NEGATIVE Final   Comment: (NOTE) Fact Sheet for Patients: bloggercourse.com  Fact Sheet for Healthcare Providers: seriousbroker.it  This test is not yet approved or cleared by the [place]  FDA and has been authorized for detection and/or diagnosis of SARS-CoV-2 by FDA under an Emergency Use Authorization (EUA). This EUA will remain in effect (meaning this test can be used) for the duration of the COVID-19 declaration under Section  564(b)(1) of the Act, 21 U.S.C. section 360bbb-3(b)(1), unless the authorization is terminated or revoked.  Performed at [facility], [address] [place], [place] [address]    Color, Urine 02/23/2024 YELLOW  YELLOW Final   APPearance 02/23/2024 CLEAR  CLEAR Final   Specific Gravity, Urine 02/23/2024 1.018  1.005 - 1.030 Final   pH 02/23/2024 7.0  5.0 - 8.0 Final   Glucose, UA 02/23/2024 NEGATIVE  NEGATIVE mg/dL Final   Hgb urine dipstick 02/23/2024 TRACE (A)  NEGATIVE Final   Bilirubin Urine 02/23/2024 NEGATIVE  NEGATIVE Final   Ketones, ur 02/23/2024 NEGATIVE  NEGATIVE mg/dL Final   Protein, ur 98/87/7973 NEGATIVE  NEGATIVE mg/dL Final   Nitrite  98/87/7973 NEGATIVE  NEGATIVE Final   Leukocytes,Ua 02/23/2024 TRACE (A)  NEGATIVE Final   RBC / HPF 02/23/2024 0-5  0 - 5 RBC/hpf Final   WBC, UA 02/23/2024 0-5  0 - 5 WBC/hpf Final   Bacteria, UA 02/23/2024 RARE (A)  NONE SEEN Final   Squamous Epithelial / HPF 02/23/2024 0-5  0 - 5 /HPF Final   Performed at [facility], [address], [place], [place] [address]  Admission on 02/22/2024, Discharged on 02/22/2024  Component Date Value Ref Range Status   Sodium 02/22/2024 135  135 - 145 mmol/L Final   Potassium 02/22/2024 3.8  3.5 - 5.1 mmol/L Final   Chloride 02/22/2024 98  98 - 111 mmol/L Final   CO2 02/22/2024 26  22 - 32 mmol/L Final   Glucose, Bld 02/22/2024 122 (H)  70 - 99 mg/dL Final   Glucose reference range applies only to samples taken after fasting for at least 8 hours.   BUN 02/22/2024 19  8 - 23 mg/dL Final   Creatinine, Ser 02/22/2024 0.88  0.44 - 1.00 mg/dL Final   Calcium 98/88/7973 9.7  8.9 - 10.3 mg/dL Final   Total Protein 98/88/7973 6.9  6.5 - 8.1 g/dL Final   Albumin 98/88/7973 4.2  3.5 - 5.0 g/dL Final   AST 98/88/7973 25  15 - 41 U/L Final   HEMOLYSIS AT THIS LEVEL MAY AFFECT RESULT   ALT 02/22/2024 9  0 - 44 U/L Final   Alkaline Phosphatase 02/22/2024 72  38 - 126 U/L Final   Total Bilirubin 02/22/2024 0.2  0.0 - 1.2 mg/dL Final   GFR, Estimated 02/22/2024 >60  >60 mL/min Final   Comment: (NOTE) Calculated using the CKD-EPI Creatinine Equation (2021)    Anion gap 02/22/2024 11  5 - 15 Final   Performed at [facility], [address] [place], [place] [address]   WBC 02/22/2024 5.4  4.0 - 10.5 K/uL Final   RBC 02/22/2024 4.13  3.87 - 5.11 MIL/uL Final   Hemoglobin 02/22/2024 12.6  12.0 - 15.0 g/dL Final   HCT 98/88/7973 39.3  36.0 - 46.0 % Final   MCV 02/22/2024 95.2  80.0 - 100.0 fL Final   MCH 02/22/2024 30.5  26.0 - 34.0 pg Final   MCHC 02/22/2024 32.1  30.0 - 36.0 g/dL Final   RDW 98/88/7973 13.2  11.5 - 15.5 %  Final   Platelets 02/22/2024 227  150 - 400 K/uL Final   nRBC 02/22/2024 0.0  0.0 - 0.2 % Final   Neutrophils Relative % 02/22/2024 64  % Final   Neutro Abs 02/22/2024 3.4  1.7 - 7.7 K/uL Final   Lymphocytes Relative 02/22/2024 21  % Final   Lymphs Abs 02/22/2024 1.1  0.7 - 4.0  K/uL Final   Monocytes Relative 02/22/2024 13  % Final   Monocytes Absolute 02/22/2024 0.7  0.1 - 1.0 K/uL Final   Eosinophils Relative 02/22/2024 2  % Final   Eosinophils Absolute 02/22/2024 0.1  0.0 - 0.5 K/uL Final   Basophils Relative 02/22/2024 0  % Final   Basophils Absolute 02/22/2024 0.0  0.0 - 0.1 K/uL Final   Immature Granulocytes 02/22/2024 0  % Final   Abs Immature Granulocytes 02/22/2024 0.02  0.00 - 0.07 K/uL Final   Performed at [facility], [address], [place], [place] [address]   Troponin T High Sensitivity 02/22/2024 <15  0 - 19 ng/L Final   Comment: (NOTE) Biotin concentrations > 1000 ng/mL falsely decrease TnT results.  Serial cardiac troponin measurements are suggested.  Refer to the Links section for chest pain algorithms and additional  guidance. Performed at [facility], [address] [place], [place] [address]   Procedure visit on 12/10/2023  Component Date Value Ref Range Status   SURGICAL PATHOLOGY 12/10/2023    Final-Edited                   Value:SURGICAL PATHOLOGY [facility] [address] [place], [place] [address] Telephone [phone] or [phone] Fax [phone]  REPORT OF SURGICAL PATHOLOGY   Accession #: [id number] Patient Name: ADDALYN, SPEEDY Visit # : [id number]  MRN: [id number] Physician: Avram Pitts DOB/Age 03-Jul-1955 (Age: 60) Gender: F Collected Date: 12/10/2023 Received Date: 12/12/2023  FINAL DIAGNOSIS       1. Surgical [P], duodenal :       - UNREMARKABLE DUODENAL MUCOSA.      - NEGATIVE FOR FEATURES OF CELIAC, DYSPLASIA, AND MALIGNANCY.       2. Surgical  [P], gastric antrum polyp :       - POLYPOID FRAGMENT OF ANTRAL MUCOSA WITH REACTIVE FOVEOLAR HYPERPLASIA.      - NEGATIVE FOR H. PYLORI, INTESTINAL METAPLASIA, DYSPLASIA, AND MALIGNANCY.       3. 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      - NEGATIVE FOR MALIGNANCY.       4. Surgical [P], colon, ascending, polyp (2) :                                - TUBULAR ADENOMA (MULTIPLE FRAGMENTS); NEGATIVE FOR HIGH-GRADE DYSPLASIA AND      MALIGNANCY.      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       ELECTRONIC SIGNATURE : Rubinas Md, Rexene , Sports Administrator, Electronic Signature  MICROSCOPIC DESCRIPTION  CASE COMMENTS STAINS USED IN DIAGNOSIS: H&E H&E H&E H&E-2 H&E H&E-2    CLINICAL HISTORY  SPECIMEN(S) OBTAINED 1. Surgical [P], Duodenal 2. Surgical [P], Gastric Antrum Polyp 3. Surgical [P], Colon, Cecum, Polyp (1) 4. Surgical [P], Colon, Ascending, Polyp (2)  SPECIMEN COMMENTS: 1. Iron deficiency anemia, unspecified iron deficiency anemia type; history of helicobacter pylori infection; hx of colonic polyps; benign neoplasm of cecum; benign neoplasm of ascending colon SPECIMEN CLINICAL INFORMATION: 1. R/O celiac sprue 2. R/O other neoplasia 3. R/O adenoma 4. R/O adenoma    Gross Description 1. Received in formalin are tan, sof  t tissue fragments that are submitted in toto.Number: multiple, Size: 0.2 cm smallest to 0.4 cm largest, (1B) ( TA ) 2. Received in formalin are tan, soft tissue fragments that are submitted in toto.Number: 2 Size: 0.3 cm, (1B) ( TA ) 3. Received in formalin are tan, soft tissue fragments that are submitted in toto.Number: 2, Size: 0.9 cm smallest to 1.0 cm largest = 3 sec, (1B) ( TA ) 4. Received in formalin are tan, soft tissue fragments that are submitted in toto.Number: 2, Size: 0.3 cm  smallest to 1.5 cm largest = 3 sec, (1B) ( TA )        Report signed out from the following location(s) [place] [address] [place], [place] [address] CLIA #: [id number]  [facility] [address] [place], [place] [address] CLIA #: [id number]   Lab on 09/24/2023  Component Date Value Ref Range Status   Iron 09/24/2023 60  42 - 145 ug/dL Final   Transferrin 91/86/7974 314.0  212.0 - 360.0 mg/dL Final   Saturation Ratios 09/24/2023 13.6 (L)  20.0 - 50.0 % Final   Ferritin 09/24/2023 4.7 (L)  10.0 - 291.0 ng/mL Final   TIBC 09/24/2023 439.6  250.0 - 450.0 mcg/dL Final   Sodium 91/86/7974 137  135 - 145 mEq/L Final   Potassium 09/24/2023 3.7  3.5 - 5.1 mEq/L Final   Chloride 09/24/2023 99  96 - 112 mEq/L Final   CO2 09/24/2023 31  19 - 32 mEq/L Final   Glucose, Bld 09/24/2023 134 (H)  70 - 99 mg/dL Final   BUN 91/86/7974 15  6 - 23 mg/dL Final   Creatinine, Ser 09/24/2023 1.10  0.40 - 1.20 mg/dL Final   Total Bilirubin 09/24/2023 0.3  0.2 - 1.2 mg/dL Final   Alkaline Phosphatase 09/24/2023 55  39 - 117 U/L Final   AST 09/24/2023 14  0 - 37 U/L Final   ALT 09/24/2023 10  0 - 35 U/L Final   Total Protein 09/24/2023 6.5  6.0 - 8.3 g/dL Final   Albumin 91/86/7974 4.0  3.5 - 5.2 g/dL Final   GFR 91/86/7974 52.39 (L)  >60.00 mL/min Final   Calculated using the CKD-EPI Creatinine Equation (2021)   Calcium 09/24/2023 9.2  8.4 - 10.5 mg/dL Final   WBC 91/86/7974 3.9 (L)  4.0 - 10.5 K/uL Final   RBC 09/24/2023 4.43  3.87 - 5.11 Mil/uL Final   Hemoglobin 09/24/2023 13.1  12.0 - 15.0 g/dL Final   HCT 91/86/7974 39.7  36.0 - 46.0 % Final   MCV 09/24/2023 89.5  78.0 - 100.0 fl Final   MCHC 09/24/2023 33.1  30.0 - 36.0 g/dL Final   RDW 91/86/7974 14.5  11.5 - 15.5 % Final   Platelets 09/24/2023 191.0  150.0 - 400.0 K/uL Final   Neutrophils Relative % 09/24/2023 57.9  43.0 - 77.0 % Final   Lymphocytes Relative 09/24/2023 26.2  12.0 - 46.0 % Final    Monocytes Relative 09/24/2023 11.7  3.0 - 12.0 % Final   Eosinophils Relative 09/24/2023 3.2  0.0 - 5.0 % Final   Basophils Relative 09/24/2023 1.0  0.0 - 3.0 % Final   Neutro Abs 09/24/2023 2.3  1.4 - 7.7 K/uL Final   Lymphs Abs 09/24/2023 1.0  0.7 - 4.0 K/uL Final   Monocytes Absolute 09/24/2023 0.5  0.1 - 1.0 K/uL Final   Eosinophils Absolute 09/24/2023 0.1  0.0 - 0.7 K/uL Final   Basophils Absolute  09/24/2023 0.0  0.0 - 0.1 K/uL Final    Blood Alcohol level:  Lab Results  Component Value Date   [facility] <15 03/02/2024   ETH <15 02/28/2024    Metabolic Disorder Labs: Lab Results  Component Value Date   HGBA1C 6.0 (H) 10/04/2020   MPG 125.5 10/04/2020   No results found for: PROLACTIN Lab Results  Component Value Date   CHOL 220 09/13/2009   TRIG 159 09/13/2009   HDL 70 09/13/2009   VLDL 32 09/13/2009   LDLCALC 118 09/13/2009    Therapeutic Lab Levels: No results found for: LITHIUM No results found for: VALPROATE No results found for: CBMZ  Physical Findings   Mini-Mental    Flowsheet Row Office Visit from 10/23/2020 in [facility] Office Visit from 11/19/2018 in [facility] Office Visit from 03/13/2017 in [facility]  Total Score (max 30 points ) 26 23 29    PHQ2-9    Flowsheet Row ED from 03/02/2024 in [facility] Nutrition from 07/22/2023 in [facility]  - A Dept Of [place] Office Visit from 11/18/2014 in [facility] - A Dept Of [facility]. [facility] Office Visit from 02/18/2014 in [facility] - A Dept Of [facility]. [facility]  PHQ-2 Total Score 4 2 0 0  PHQ-9 Total Score 12 16 -- --   Flowsheet Row ED from 03/03/2024 in [facility] Most recent reading at 03/03/2024  6:42 AM ED from 03/02/2024 in [facility] at [facility] Most recent reading at 03/02/2024  8:33 PM ED from 03/02/2024 in [facility] Most recent reading at 03/02/2024  6:19 PM  C-SSRS RISK CATEGORY No Risk No Risk Moderate Risk     Musculoskeletal  Strength & Muscle Tone: within normal limits Gait & Station: normal Patient leans: N/A  Psychiatric Specialty Exam  Presentation  General Appearance:  Casual  Eye Contact: Good  Speech: Clear and Coherent; Slow  Speech Volume: Normal  Handedness: Right   Mood and Affect  Mood: Anxious  Affect: Congruent   Thought Process  Thought Processes: Coherent; Goal Directed  Descriptions of Associations:Intact  Orientation:Full (Time, Place and Person)  Thought Content:WDL  Diagnosis of Schizophrenia or Schizoaffective disorder in past: No data recorded Duration of Psychotic Symptoms: No data recorded  Hallucinations:Hallucinations: None  Ideas of Reference:None  Suicidal Thoughts:Suicidal Thoughts: No  Homicidal Thoughts:Homicidal Thoughts: No   Sensorium  Memory: Immediate Good  Judgment: Fair  Insight: Present   Executive Functions  Concentration: Fair  Attention Span: Fair  Recall: Fair  Fund of Knowledge: Fair  Language: Fair   Psychomotor Activity  Psychomotor Activity: Psychomotor Activity: Tremor; Restlessness   Assets  Assets: Manufacturing Systems Engineer; Desire for Improvement   Sleep  Sleep: Sleep: Poor  No Safety Checks orders active in given range  Nutritional Assessment (For OBS and FBC admissions only) Has the patient had a weight loss or gain of 10 pounds or more in the last 3 months?: No Has the patient had a decrease in food intake/or appetite?: No Does the patient have dental problems?: No Does the patient have eating habits or behaviors that may be indicators of an eating disorder including binging or inducing vomiting?: No Has the patient recently lost weight  without trying?: 0 Has the patient been eating poorly because of  a decreased appetite?: 0 Malnutrition Screening Tool Score: 0   Physical Exam  Physical Exam Vitals and nursing note reviewed.  Constitutional:      General: She is not in acute distress.    Appearance: Normal appearance. She is not ill-appearing.  HENT:     Head: Normocephalic and atraumatic.  Eyes:     Conjunctiva/sclera: Conjunctivae normal.  Pulmonary:     Effort: Pulmonary effort is normal.  Skin:    General: Skin is warm and dry.  Neurological:     Mental Status: She is alert and oriented to person, place, and time.     Motor: Weakness present.    Review of Systems  Gastrointestinal:  Negative for abdominal pain, constipation, diarrhea, nausea and vomiting.  Neurological:  Positive for tremors. Negative for dizziness, focal weakness and headaches.  Psychiatric/Behavioral:  The patient is nervous/anxious.   All other systems reviewed and are negative.  Blood pressure 138/83, pulse 71, temperature 97.7 F (36.5 C), temperature source Oral, resp. rate 18, SpO2 98%. There is no height or weight on file to calculate BMI.  Treatment Plan Summary: Patient is a 69 year old female with a psychiatric history of bipolar type II, PTSD, DID, mild cognitive impairment, chronic insomnia, adjustment disorder with mixed anxiety and depressed mood who presents voluntarily to [facility]. Patient was seen by her outpatient provider for a medical evaluation and possible adjustment of medication regimen as she's been having recent falls. Suspect polypharmacy and use of multiple sedating medications is a major contributor to recent falls; recommend reducing sedating medications as much as possible to avoid this. Possible differentials for patient's tremors and restlessness include: EPS, anxiety/panic attacks, and benzo withdrawal. From a safety standpoint, patient denies SI/HI and does not appear to be an acute safety risk to herself or  others, and does not require inpatient care.  Patient's home medications were all held by admitting provider. Switched lorazepam  to clonazepam  as clonazepam  is more long acting with hope to taper off medication outpatient. Duloxetine  was restarted at 60 mg daily as patient had been on 120 mg at home and to avoid discontinuation syndrome. Inderal  was started as we do not have nebivolol  on formulary and can use for restlessness and tremors. Ingrezza  was reordered as apparently there was previously concern for TD and it is a home med. Ordered thiamine , b12, and vitamin D  levels to evaluate for other etiologies to patient falling. Rechecking TSH to see if euthyroid  -CIWA protocol to monitor for benzodiazepine withdrawal -changed ativan  to clonazepam  0.5 mg bid -hold ambien  as sedating and multiple falls -ingrezza  60 mg restarted for TD -pantoprazole  40 mg dialy for GERD in lieu of omeprazole  -hold nabivolol  -start propranolol  10 mg bid for anxiety -hold bupropion  to reduce risk of seizure while possibly withdrawing for benzodiazepines -holding trazodone  as it can increase risk for falls -restart duloxetine  @ 60 mg for depression -continue risperidone  2 mg at bedtime -hold ambien  due to high risk for falls -synthroid  25 mcg daily for hypothyroidism -Reached out to outpatient provider, Rock Massa, NP to discuss medication regimen; awaiting return phone call. -Patient was diagnosed with uncomplicated UTI 1/20 in the ED, and received one-time dose of fosfomycin . Continue to monitor for urinary symptoms. -labs: tsh, vit d, vit b1, vit b12  Prentice Espy, MD 03/04/2024 9:20 AM

## 2024-03-04 NOTE — ED Notes (Signed)
 Pt alert at change of shift, some confusion noted. Pt had wheelchair by the bedside that she said she needs because some time she feels dizzy. Pt has reported that she had multiple falls prior to admission related to feeling dizzy when she stands from sitting position. Pt has been educated to sit up for a minute and then rise slowly. Q15 min obs for safety in effect. Pt currently sleeping in lounger, no distress or discomfort noted. Will continue to monitor and report changes as noted.

## 2024-03-04 NOTE — ED Notes (Signed)
 Pt remains restless, up and down on recliner chair to wheelchair.  Awake, alert & responsive, no distress noted.  Monitoring for safety.

## 2024-03-04 NOTE — ED Notes (Signed)
 Pt resting at present no distress noted, monitoring for safety.
